# Patient Record
Sex: Male | Born: 1970
Health system: Southern US, Community
[De-identification: ages and names within clinical notes are randomized; demographics above are authoritative.]

## PROBLEM LIST (undated history)

## (undated) DIAGNOSIS — F319 Bipolar disorder, unspecified: Secondary | ICD-10-CM

## (undated) DIAGNOSIS — E785 Hyperlipidemia, unspecified: Secondary | ICD-10-CM

## (undated) DIAGNOSIS — F4310 Post-traumatic stress disorder, unspecified: Secondary | ICD-10-CM

## (undated) DIAGNOSIS — F419 Anxiety disorder, unspecified: Secondary | ICD-10-CM

## (undated) DIAGNOSIS — A159 Respiratory tuberculosis unspecified: Secondary | ICD-10-CM

## (undated) DIAGNOSIS — I219 Acute myocardial infarction, unspecified: Secondary | ICD-10-CM

## (undated) DIAGNOSIS — I2510 Atherosclerotic heart disease of native coronary artery without angina pectoris: Secondary | ICD-10-CM

## (undated) DIAGNOSIS — I255 Ischemic cardiomyopathy: Secondary | ICD-10-CM

## (undated) DIAGNOSIS — I739 Peripheral vascular disease, unspecified: Secondary | ICD-10-CM

## (undated) DIAGNOSIS — Z87898 Personal history of other specified conditions: Secondary | ICD-10-CM

## (undated) DIAGNOSIS — I10 Essential (primary) hypertension: Secondary | ICD-10-CM

## (undated) DIAGNOSIS — U071 COVID-19: Secondary | ICD-10-CM

## (undated) HISTORY — DX: Personal history of other specified conditions: Z87.898

## (undated) HISTORY — PX: CORONARY ANGIOPLASTY WITH STENT PLACEMENT: SHX49

## (undated) HISTORY — DX: Essential (primary) hypertension: I10

## (undated) HISTORY — DX: Acute myocardial infarction, unspecified: I21.9

## (undated) HISTORY — DX: Hyperlipidemia, unspecified: E78.5

## (undated) HISTORY — DX: Anxiety disorder, unspecified: F41.9

## (undated) HISTORY — DX: Atherosclerotic heart disease of native coronary artery without angina pectoris: I25.10

## (undated) HISTORY — DX: Ischemic cardiomyopathy: I25.5

---

## 2005-08-31 ENCOUNTER — Emergency Department (HOSPITAL_COMMUNITY): Admission: EM | Admit: 2005-08-31 | Discharge: 2005-08-31 | Payer: Self-pay | Admitting: Family Medicine

## 2005-10-19 ENCOUNTER — Inpatient Hospital Stay (HOSPITAL_COMMUNITY): Admission: EM | Admit: 2005-10-19 | Discharge: 2005-10-23 | Payer: Self-pay | Admitting: Cardiology

## 2005-10-19 ENCOUNTER — Encounter: Payer: Self-pay | Admitting: Emergency Medicine

## 2005-10-19 ENCOUNTER — Ambulatory Visit: Payer: Self-pay | Admitting: Cardiology

## 2005-10-21 ENCOUNTER — Encounter: Payer: Self-pay | Admitting: Cardiology

## 2005-11-09 ENCOUNTER — Ambulatory Visit: Payer: Self-pay | Admitting: Cardiology

## 2006-01-07 ENCOUNTER — Emergency Department (HOSPITAL_COMMUNITY): Admission: EM | Admit: 2006-01-07 | Discharge: 2006-01-07 | Payer: Self-pay | Admitting: Family Medicine

## 2006-12-16 ENCOUNTER — Ambulatory Visit: Payer: Self-pay | Admitting: Cardiology

## 2006-12-16 LAB — CONVERTED CEMR LAB
ALT: 31 units/L (ref 0–40)
AST: 21 units/L (ref 0–37)
Albumin: 3.7 g/dL (ref 3.5–5.2)
Alkaline Phosphatase: 64 units/L (ref 39–117)
Bilirubin, Direct: 0.1 mg/dL (ref 0.0–0.3)
Cholesterol: 145 mg/dL (ref 0–200)
Direct LDL: 84.9 mg/dL
HDL: 29.4 mg/dL — ABNORMAL LOW (ref >39.0)
LDL Cholesterol: 93 mg/dL (ref 0–99)
Total Bilirubin: 1.1 mg/dL (ref 0.3–1.2)
Total CHOL/HDL Ratio: 4.9
Total Protein: 7.7 g/dL (ref 6.0–8.3)
Triglycerides: 113 mg/dL (ref 0–149)
VLDL: 23 mg/dL (ref 0–40)

## 2006-12-17 ENCOUNTER — Ambulatory Visit: Payer: Self-pay | Admitting: Cardiology

## 2007-01-17 ENCOUNTER — Ambulatory Visit: Payer: Self-pay | Admitting: Internal Medicine

## 2007-01-17 LAB — CONVERTED CEMR LAB
ALT: 40 units/L (ref 0–40)
AST: 24 units/L (ref 0–37)
Albumin: 3.6 g/dL (ref 3.5–5.2)
Alkaline Phosphatase: 85 units/L (ref 39–117)
BUN: 8 mg/dL (ref 6–23)
Bacteria, UA: NEGATIVE
Basophils Absolute: 0 10*3/uL (ref 0.0–0.1)
Basophils Relative: 0.4 % (ref 0.0–1.0)
Bilirubin Urine: NEGATIVE
Bilirubin, Direct: 0.1 mg/dL (ref 0.0–0.3)
C-Peptide: 1.17 ng/mL (ref 0.80–3.90)
CO2: 33 meq/L — ABNORMAL HIGH (ref 19–32)
Calcium: 8.9 mg/dL (ref 8.4–10.5)
Chloride: 99 meq/L (ref 96–112)
Creatinine, Ser: 1.3 mg/dL (ref 0.4–1.5)
Creatinine,U: 57.4 mg/dL
Crystals: NEGATIVE
Eosinophils Absolute: 0.1 10*3/uL (ref 0.0–0.6)
Eosinophils Relative: 1.4 % (ref 0.0–5.0)
GFR calc Af Amer: 80 mL/min
GFR calc non Af Amer: 66 mL/min
Glucose, Bld: 434 mg/dL — ABNORMAL HIGH (ref 70–99)
HCT: 46.4 % (ref 39.0–52.0)
Hemoglobin, Urine: NEGATIVE
Hemoglobin: 15.5 g/dL (ref 13.0–17.0)
Hgb A1c MFr Bld: 16.1 % — ABNORMAL HIGH (ref 4.6–6.0)
Ketones, ur: NEGATIVE mg/dL
Leukocytes, UA: NEGATIVE
Lymphocytes Relative: 40.2 % (ref 12.0–46.0)
MCHC: 33.4 g/dL (ref 30.0–36.0)
MCV: 80.3 fL (ref 78.0–100.0)
Microalb Creat Ratio: 3.5 mg/g (ref 0.0–30.0)
Microalb, Ur: 0.2 mg/dL (ref 0.0–1.9)
Monocytes Absolute: 0.2 10*3/uL (ref 0.2–0.7)
Monocytes Relative: 4 % (ref 3.0–11.0)
Mucus, UA: NEGATIVE
Neutro Abs: 3.2 10*3/uL (ref 1.4–7.7)
Neutrophils Relative %: 54 % (ref 43.0–77.0)
Nitrite: NEGATIVE
Platelets: 242 10*3/uL (ref 150–400)
Potassium: 4.2 meq/L (ref 3.5–5.1)
RBC: 5.78 M/uL (ref 4.22–5.81)
RDW: 11.4 % — ABNORMAL LOW (ref 11.5–14.6)
Sodium: 134 meq/L — ABNORMAL LOW (ref 135–145)
Specific Gravity, Urine: 1.01 (ref 1.000–1.03)
Squamous Epithelial / LPF: NEGATIVE /lpf
TSH: 1.21 microintl units/mL (ref 0.35–5.50)
Total Bilirubin: 1 mg/dL (ref 0.3–1.2)
Total Protein, Urine: NEGATIVE mg/dL
Total Protein: 8 g/dL (ref 6.0–8.3)
Urine Glucose: >=1000 mg/dL — CR
Urobilinogen, UA: 0.2 (ref 0.0–1.0)
WBC, UA: NONE SEEN cells/hpf
WBC: 5.9 10*3/uL (ref 4.5–10.5)
pH: 5.5 (ref 5.0–8.0)

## 2007-01-20 ENCOUNTER — Ambulatory Visit: Payer: Self-pay | Admitting: Internal Medicine

## 2009-09-15 ENCOUNTER — Ambulatory Visit: Payer: Self-pay | Admitting: Family Medicine

## 2009-09-15 ENCOUNTER — Inpatient Hospital Stay (HOSPITAL_COMMUNITY): Admission: EM | Admit: 2009-09-15 | Discharge: 2009-09-17 | Payer: Self-pay | Admitting: Emergency Medicine

## 2009-09-15 ENCOUNTER — Ambulatory Visit: Payer: Self-pay | Admitting: Internal Medicine

## 2009-09-16 ENCOUNTER — Encounter: Payer: Self-pay | Admitting: Family Medicine

## 2009-09-19 DIAGNOSIS — E1165 Type 2 diabetes mellitus with hyperglycemia: Secondary | ICD-10-CM | POA: Insufficient documentation

## 2009-09-25 ENCOUNTER — Emergency Department (HOSPITAL_COMMUNITY): Admission: EM | Admit: 2009-09-25 | Discharge: 2009-09-25 | Payer: Self-pay | Admitting: Emergency Medicine

## 2009-09-27 ENCOUNTER — Encounter: Payer: Self-pay | Admitting: Family Medicine

## 2009-09-27 ENCOUNTER — Ambulatory Visit: Payer: Self-pay | Admitting: Family Medicine

## 2009-09-27 DIAGNOSIS — F411 Generalized anxiety disorder: Secondary | ICD-10-CM | POA: Insufficient documentation

## 2009-09-27 LAB — CONVERTED CEMR LAB
BUN: 14 mg/dL (ref 6–23)
CO2: 27 meq/L (ref 19–32)
Calcium: 9.5 mg/dL (ref 8.4–10.5)
Chloride: 101 meq/L (ref 96–112)
Creatinine, Ser: 1.03 mg/dL (ref 0.40–1.50)
Glucose, Bld: 148 mg/dL — ABNORMAL HIGH (ref 70–99)
Potassium: 4.3 meq/L (ref 3.5–5.3)
Sodium: 138 meq/L (ref 135–145)

## 2009-10-03 ENCOUNTER — Encounter: Payer: Self-pay | Admitting: Physician Assistant

## 2009-10-03 ENCOUNTER — Ambulatory Visit: Payer: Self-pay | Admitting: Cardiology

## 2009-10-03 ENCOUNTER — Ambulatory Visit: Payer: Self-pay | Admitting: Family Medicine

## 2009-10-03 ENCOUNTER — Ambulatory Visit (HOSPITAL_COMMUNITY): Admission: RE | Admit: 2009-10-03 | Discharge: 2009-10-03 | Payer: Self-pay | Admitting: Family Medicine

## 2009-10-08 ENCOUNTER — Encounter: Payer: Self-pay | Admitting: *Deleted

## 2009-10-23 ENCOUNTER — Encounter: Payer: Self-pay | Admitting: Family Medicine

## 2009-10-23 ENCOUNTER — Ambulatory Visit: Payer: Self-pay | Admitting: Family Medicine

## 2009-10-23 LAB — CONVERTED CEMR LAB
Albumin/Creatinine Ratio, Urine, POC: <30
BUN: 16 mg/dL (ref 6–23)
CO2: 32 meq/L (ref 19–32)
Calcium: 9.1 mg/dL (ref 8.4–10.5)
Chloride: 99 meq/L (ref 96–112)
Creatinine, Ser: 1.02 mg/dL (ref 0.40–1.50)
Creatinine,U: 200 mg/dL
Glucose, Bld: 229 mg/dL — ABNORMAL HIGH (ref 70–99)
Hgb A1c MFr Bld: 8.9 %
Microalbumin U total vol: 30 mg/L
Potassium: 4.3 meq/L (ref 3.5–5.3)
Sodium: 137 meq/L (ref 135–145)

## 2009-11-01 ENCOUNTER — Encounter: Payer: Self-pay | Admitting: Family Medicine

## 2009-12-12 ENCOUNTER — Encounter: Payer: Self-pay | Admitting: Family Medicine

## 2010-01-27 ENCOUNTER — Telehealth: Payer: Self-pay | Admitting: *Deleted

## 2010-03-24 ENCOUNTER — Telehealth: Payer: Self-pay | Admitting: Family Medicine

## 2010-04-04 ENCOUNTER — Encounter (INDEPENDENT_AMBULATORY_CARE_PROVIDER_SITE_OTHER): Payer: Self-pay | Admitting: *Deleted

## 2010-04-14 ENCOUNTER — Ambulatory Visit: Payer: Self-pay | Admitting: Diagnostic Radiology

## 2010-04-14 ENCOUNTER — Emergency Department (HOSPITAL_BASED_OUTPATIENT_CLINIC_OR_DEPARTMENT_OTHER): Admission: EM | Admit: 2010-04-14 | Discharge: 2010-04-14 | Payer: Self-pay | Admitting: Emergency Medicine

## 2010-05-04 ENCOUNTER — Emergency Department (HOSPITAL_BASED_OUTPATIENT_CLINIC_OR_DEPARTMENT_OTHER): Admission: EM | Admit: 2010-05-04 | Discharge: 2010-05-04 | Payer: Self-pay | Admitting: Emergency Medicine

## 2010-05-05 ENCOUNTER — Emergency Department (HOSPITAL_COMMUNITY): Admission: EM | Admit: 2010-05-05 | Discharge: 2010-05-05 | Payer: Self-pay | Admitting: Emergency Medicine

## 2010-07-10 ENCOUNTER — Ambulatory Visit: Payer: Self-pay | Admitting: Family Medicine

## 2010-07-10 ENCOUNTER — Encounter: Payer: Self-pay | Admitting: Family Medicine

## 2010-07-10 ENCOUNTER — Telehealth (INDEPENDENT_AMBULATORY_CARE_PROVIDER_SITE_OTHER): Payer: Self-pay | Admitting: *Deleted

## 2010-07-10 DIAGNOSIS — I2510 Atherosclerotic heart disease of native coronary artery without angina pectoris: Secondary | ICD-10-CM | POA: Insufficient documentation

## 2010-07-10 LAB — CONVERTED CEMR LAB
ALT: 18 units/L (ref 0–53)
AST: 20 units/L (ref 0–37)
Albumin: 4.1 g/dL (ref 3.5–5.2)
Alkaline Phosphatase: 52 units/L (ref 39–117)
BUN: 15 mg/dL (ref 6–23)
CO2: 31 meq/L (ref 19–32)
Calcium: 9.7 mg/dL (ref 8.4–10.5)
Chloride: 100 meq/L (ref 96–112)
Cholesterol: 106 mg/dL (ref 0–200)
Creatinine, Ser: 0.98 mg/dL (ref 0.40–1.50)
Glucose, Bld: 166 mg/dL — ABNORMAL HIGH (ref 70–99)
HDL: 24 mg/dL — ABNORMAL LOW (ref >39)
LDL Cholesterol: 53 mg/dL (ref 0–99)
Potassium: 4.5 meq/L (ref 3.5–5.3)
Sodium: 138 meq/L (ref 135–145)
Total Bilirubin: 0.6 mg/dL (ref 0.3–1.2)
Total CHOL/HDL Ratio: 4.4
Total Protein: 8.3 g/dL (ref 6.0–8.3)
Triglycerides: 146 mg/dL (ref <150)
VLDL: 29 mg/dL (ref 0–40)

## 2010-07-14 ENCOUNTER — Telehealth (INDEPENDENT_AMBULATORY_CARE_PROVIDER_SITE_OTHER): Payer: Self-pay | Admitting: *Deleted

## 2010-07-18 ENCOUNTER — Ambulatory Visit: Payer: Self-pay | Admitting: Family Medicine

## 2010-07-30 ENCOUNTER — Telehealth: Payer: Self-pay | Admitting: Family Medicine

## 2010-07-31 ENCOUNTER — Ambulatory Visit: Payer: Self-pay | Admitting: Family Medicine

## 2010-08-19 ENCOUNTER — Encounter: Payer: Self-pay | Admitting: Family Medicine

## 2010-08-19 ENCOUNTER — Ambulatory Visit
Admission: RE | Admit: 2010-08-19 | Discharge: 2010-08-19 | Payer: Self-pay | Source: Home / Self Care | Attending: Family Medicine | Admitting: Family Medicine

## 2010-08-19 DIAGNOSIS — L209 Atopic dermatitis, unspecified: Secondary | ICD-10-CM | POA: Insufficient documentation

## 2010-09-09 NOTE — Progress Notes (Signed)
Summary: meds  Phone Note Refill Request Call back at [phone] Message from:  Patient  Refills Requested: Medication #1:  CARVEDILOL 25 MG TABS Take one tablet by mouth twice a day   Dosage confirmed as above?Dosage Confirmed   Brand Name Necessary? No   Supply Requested: 3 months  Medication #2:  METFORMIN HCL 500 MG XR24H-TAB 2 tabs qam..1 tab lunch time   Dosage confirmed as above?Dosage Confirmed   Brand Name Necessary? No   Supply Requested: 3 months  Medication #3:  ACCURETIC 20-25 MG TABS take 1 tab daily   Dosage confirmed as above?Dosage Confirmed   Brand Name Necessary? No   Supply Requested: 3 months wants to see if he can get these meds this week b/c his ins will be running out and wants enough to last for a little while more.   Initial call taken by: De Nurse,  March 24, 2010 1:31 PM  Follow-up for Phone Call        Rx completed in Dr. Tiajuana Amass Follow-up by: [name] DO,  March 24, 2010 2:14 PM    Prescriptions: ACCURETIC 20-25 MG TABS (QUINAPRIL-HYDROCHLOROTHIAZIDE) take 1 tab daily  #93 x 3   Entered and Authorized by:   [name] DO   Signed by:   [name] DO on 03/24/2010   Method used:   Electronically to        [facility] [phone]* (retail)       [address]       [place], [place]  [address]       Ph: [phone]       Fax: [phone]   RxID:   [id number] CARVEDILOL 25 MG TABS (CARVEDILOL) Take one tablet by mouth twice a day  #60 x 6   Entered and Authorized by:   [name] DO   Signed by:   [name] DO on 03/24/2010   Method used:   Electronically to        [facility] [phone]* (retail)       [address]       [place], [place]  [address]       Ph: [phone]       Fax: [phone]   RxID:   [id number] METFORMIN HCL 500 MG XR24H-TAB (METFORMIN HCL) 2 tabs qam..1 tab lunch time  #270 x 0   Entered and Authorized by:   [name] DO   Signed by:   [name] DO on 03/24/2010  Method used:   Electronically to        [facility] [phone]* (retail)       [address]       [place], [place]  [address]       Ph: [phone]       Fax: [phone]   RxID:   [id number]

## 2010-09-09 NOTE — Assessment & Plan Note (Signed)
Summary: f/u meds,df   Vital Signs:  Patient profile:   40 year old male Height:      72 inches Weight:      175 pounds BMI:     23.82 Temp:     98.5 degrees F Pulse rate:   76 / minute BP sitting:   140 / 92  (right arm)  Vitals Entered By: Rochele Pages RN (July 18, 2010 10:55 AM) CC: f/u meds Is Patient Diabetic? Yes Pain Assessment Patient in pain? no        Primary Care Kyler Germer:  [name]  CC:  f/u meds.  History of Present Illness: 40 yo male here for fu/  1. DM glucose low:67 Glucose high:222 Last A1C:6.3 beginning of this month.  Taking Meds:yes On INsulin:levimir 10 units Side effects:no ROS: Denies polyuria, polydipsia, visual changes numbness in extremities, foot ulcers Lifestyle modifications: has lost 15 pounds since being seen here this year.   2.  htn  BP today:140/92 on manual recheck Taking Meds:yes including 2 different ace inhibitors  Side Effects:no ROS: denies Headache visual changes nausea vomiting abdominal pain numbness in extremities    3.  Obesity-  Pt has lost 15 pounds and is feeling great, not really trying but has been eating less and trying to eat right to keep his A1C better    Habits & Providers  Alcohol-Tobacco-Diet     Tobacco Status: never  Allergies: No Known Drug Allergies  Physical Exam  General:  Well-developed,well-nourished,in no acute distress; alert,appropriate and cooperative throughout examination Eyes:  PERRLA, EOMI, minimal AV nicking seen on fudoscopic.   Mouth:  Oral mucosa and oropharynx without lesions or exudates.  Teeth in good repair. Neck:  no LAD Lungs:  normal respiratory effort and normal breath sounds.   Heart:  RRR mo murmur Abdomen:  BS + NT ND Msk:  5/5 strength in all extremities.  Pulses:  2+ Extremities:  no edema Neurologic:  CN 2-12 intact in all extremities.    Impression & Recommendations:  Problem # 1:  HYPERTENSION (ICD-401.9) pt is near goal but not there yet,  labs look good continue current regimen.   His updated medication list for this problem includes:    Carvedilol 25 Mg Tabs (Carvedilol) .Marland Kitchen... Take one tablet by mouth twice a day    Accuretic 20-25 Mg Tabs (Quinapril-hydrochlorothiazide) .Marland Kitchen... Take 1 tab daily    Accupril 10 Mg Tabs (Quinapril hcl) .Marland Kitchen... Take 1 tab daily in pm  Orders: [facility]- Est  Level 4 ([address]214)  BP today: 140/100 Prior BP: 177/110 (07/10/2010)  Labs Reviewed: K+: 4.5 (07/10/2010) Creat: : 0.98 (07/10/2010)   Chol: 106 (07/10/2010)   HDL: 24 (07/10/2010)   LDL: 53 (07/10/2010)   TG: 146 (07/10/2010)  Problem # 2:  DM (ICD-250.00) At goal.  Fantastic turn around from A1C of 16 when pt was originally seen now 6.3.  No side effects from medicine, will contineu current regimen, if pt starts to lose more weight will have to remove some the medication.  His updated medication list for this problem includes:    Metformin Hcl 500 Mg Xr24h-tab (Metformin hcl) .Marland Kitchen... 2 tabs qam..1 tab lunch time    Aspirin 81 Mg Tbec (Aspirin) .Marland Kitchen... Take one tablet by mouth daily    Accuretic 20-25 Mg Tabs (Quinapril-hydrochlorothiazide) .Marland Kitchen... Take 1 tab daily    Accupril 10 Mg Tabs (Quinapril hcl) .Marland Kitchen... Take 1 tab daily in pm    Levemir 100 Unit/ml Soln (Insulin detemir) .Marland KitchenMarland KitchenMarland KitchenMarland Kitchen  10 untis at bedtime  Orders: [facility]- Est  Level 4 ([address]64)  Labs Reviewed: Creat: 0.98 (07/10/2010)   Microalbumin: 30 (10/23/2009) Reviewed HgBA1c results: 6.3 (07/10/2010)  8.9 (10/23/2009)  Problem # 3:  ARTERIOSCLEROTIC HEART DISEASE (ICD-414.00) Pt is doing very well on all meds properly on lipitor as well  His updated medication list for this problem includes:    Aspirin 81 Mg Tbec (Aspirin) .Marland Kitchen... Take one tablet by mouth daily    Carvedilol 25 Mg Tabs (Carvedilol) .Marland Kitchen... Take one tablet by mouth twice a day    Nitrostat 0.4 Mg Subl (Nitroglycerin) .Marland Kitchen... 1 tablet under tongue at onset of chest pain; you may repeat every 5 minutes for up to 3 doses.    Accuretic  20-25 Mg Tabs (Quinapril-hydrochlorothiazide) .Marland Kitchen... Take 1 tab daily    Accupril 10 Mg Tabs (Quinapril hcl) .Marland Kitchen... Take 1 tab daily in pm  Orders: [facility]- Est  Level 4 (33295)  Complete Medication List: 1)  Metformin Hcl 500 Mg Xr24h-tab (Metformin hcl) .... 2 tabs qam..1 tab lunch time 2)  Aspirin 81 Mg Tbec (Aspirin) .... Take one tablet by mouth daily 3)  Carvedilol 25 Mg Tabs (Carvedilol) .... Take one tablet by mouth twice a day 4)  Nitrostat 0.4 Mg Subl (Nitroglycerin) .Marland Kitchen.. 1 tablet under tongue at onset of chest pain; you may repeat every 5 minutes for up to 3 doses. 5)  Bayer Contour Test Strp (Glucose blood) .... Use as directed 6)  Accuretic 20-25 Mg Tabs (Quinapril-hydrochlorothiazide) .... Take 1 tab daily 7)  Accupril 10 Mg Tabs (Quinapril hcl) .... Take 1 tab daily in pm 8)  Lipitor 40 Mg Tabs (Atorvastatin calcium) .... Take 1 tab by mouth once daily 9)  Multivitamins Tabs (Multiple vitamin) 10)  Folic Acid 1 Mg Tabs (Folic acid) 11)  Levemir 100 Unit/ml Soln (Insulin detemir) .Marland Kitchen.. 10 untis at bedtime Prescriptions: METFORMIN HCL 500 MG XR24H-TAB (METFORMIN HCL) 2 tabs qam..1 tab lunch time  #270 x 0   Entered and Authorized by:   [name] DO   Signed by:   [name] DO on 07/18/2010   Method used:   Electronically to        [facility] [phone]* (retail)       [address]       [place], [place]  [address]       Ph: [phone]       Fax: [phone]   RxID:   [id number] METFORMIN HCL 500 MG XR24H-TAB (METFORMIN HCL) 2 tabs qam..1 tab lunch time  #270 x 0   Entered and Authorized by:   [name] DO   Signed by:   [name] DO on 07/18/2010   Method used:   Electronically to        [facility] [phone]* (retail)       [address]       [place], [place]  [address]       Ph: [phone]       Fax: [phone]   RxID:   [id number]    Orders Added: 1)  [facility]- Est  Level 4 [[address]751]    Prevention & Chronic  Care Immunizations   Influenza vaccine: Not documented   Influenza vaccine deferral: Deferred  (09/27/2009)    Tetanus booster: Not documented   Td booster deferral: Deferred  (09/27/2009)    Pneumococcal vaccine: Not documented  Other Screening   Smoking status: never  (07/18/2010)  Diabetes Mellitus   HgbA1C: 6.3  (  07/10/2010)   Hemoglobin A1C due: 12/25/2009    Eye exam: Not documented    Foot exam: Not documented   High risk foot: Not documented   Foot care education: Not documented    Urine microalbumin/creatinine ratio: 3.5  (01/17/2007)    Diabetes flowsheet reviewed?: Yes   Progress toward A1C goal: At goal    Stage of readiness to change (diabetes management): Maintenance  Lipids   Total Cholesterol: 106  (07/10/2010)   LDL: 53  (07/10/2010)   LDL Direct: 84.9  (12/16/2006)   HDL: 24  (07/10/2010)   Triglycerides: 146  (07/10/2010)   Lipid panel due: 01/17/2011  Hypertension   Last Blood Pressure: 140 / 92  (07/18/2010)   Serum creatinine: 0.98  (07/10/2010)   Serum potassium 4.5  (07/10/2010)   Basic metabolic panel due: 10/25/2009    Hypertension flowsheet reviewed?: Yes   Progress toward BP goal: Improved    Stage of readiness to change (hypertension management): Maintenance  Self-Management Support :   Personal Goals (by the next clinic visit) :     Personal A1C goal: 7  (07/18/2010)     Personal blood pressure goal: 140/90  (07/18/2010)     Personal LDL goal: 100  (07/18/2010)    Diabetes self-management support: CBG self-monitoring log, Written self-care plan, Pre-printed educational material, Referred for self-management class  (09/27/2009)    Hypertension self-management support: BP self-monitoring log, Written self-care plan, Resources for patients handout  (09/27/2009)

## 2010-09-09 NOTE — Consult Note (Signed)
Summary: [facility]   Imported By: Roderic Ovens 09/30/2009 14:09:31  _____________________________________________________________________  External Attachment:    Type:   Image     Comment:   External Document

## 2010-09-09 NOTE — Assessment & Plan Note (Signed)
Summary: FU/KH   Vital Signs:  Patient profile:   40 year old male Height:      71 inches Weight:      184.1 pounds BMI:     25.77 Temp:     98.7 degrees F oral Pulse rate:   86 / minute BP sitting:   134 / 87  (left arm) Cuff size:   regular  Vitals Entered By: Garen Grams LPN (October 23, 2009 2:50 PM) CC: f/u Is Patient Diabetic? Yes Did you bring your meter with you today? Yes Pain Assessment Patient in pain? yes     Location: left wrist   Primary Care Provider:  [name]  CC:  f/u.  History of Present Illness: Pt is doing well here for f/u on  1.  BP- f/u 134/87 today.  improved, doing well.  Taking all meds and now can afford all of them with the aid of [facility].  2.  DM- Sugars have been relatively good pt says most are now in the 130's to 140's.  Pt is now on 12 units of insulin and increase metformin to 1000mg  in AM and 500mg  in pm pt was in the 200's before.  will refer to opthomologist.  3.  left wrist pain-  improved with motrin.  Pt states he is able to do all ADL's now.  Still having pain though.  Has appointment with sports medicine on 24th.  Pt had imaging done showed no fracture.  Is improving and has not had leaking feeling as he did before  Habits & Providers  Alcohol-Tobacco-Diet     Tobacco Status: never  Current Medications (verified): 1)  Metformin Hcl 500 Mg Xr24h-Tab (Metformin Hcl) .... 2 Tabs Qam..1 Tab Lunch Time 2)  Lisinopril-Hydrochlorothiazide 20-25 Mg Tabs (Lisinopril-Hydrochlorothiazide) .... Take 1 Tab Po Daily 3)  Ativan 0.5 Mg Tabs (Lorazepam) .... Take 1 Pill Up To Two Times A Day Only As Needed 4)  Lisinopril 20 Mg Tabs (Lisinopril) .... Take 1 Tab Daily 5)  Aspirin 81 Mg Tbec (Aspirin) .... Take One Tablet By Mouth Daily 6)  Simvastatin 80 Mg Tabs (Simvastatin) .Marland Kitchen.. 1 Tab At Bedtime 7)  Carvedilol 25 Mg Tabs (Carvedilol) .... Take One Tablet By Mouth Twice A Day 8)  Nitrostat 0.4 Mg Subl (Nitroglycerin) .Marland Kitchen.. 1 Tablet  Under Tongue At Onset of Chest Pain; You May Repeat Every 5 Minutes For Up To 3 Doses. 9)  Motrin Ib 200 Mg Tabs (Ibuprofen) .... Take 2 Pills By Mouth Every 8 Hours For Pain For The Next 5 Days Then Take Only As Needed 10)  Bayer Contour Test  Strp (Glucose Blood) .... Use As Directed  Allergies (verified): No Known Drug Allergies  Past History:  Past medical, surgical, family and social histories (including risk factors) reviewed, and no changes noted (except as noted below).  Past Medical History: Reviewed history from 09/19/2009 and no changes required.  1. Diabetes.   2. Hypertension.   3. History of MA in 2007, actually is the date which he did have a       catheterization done and did have a drug eluding stent placed in       the circumflex at age 36.      Past Surgical History: Reviewed history from 09/19/2009 and no changes required.  The cardiac catheterization, otherwise   unremarkable.      Family History: Reviewed history from 09/19/2009 and no changes required.  Mother is deceased due to some coronary artery disease  and history of smoking.  She also had diabetes.  Father is deceased, he   does not know father well.  The patient does have some siblings and they   are healthy.   Social History: Reviewed history from 09/19/2009 and no changes required. He lives along with his girlfriend and her kids.  He is   divorced.  He does have 3 daughters and 1 son from 3 other women.  The   patient is unemployed at this time and used to be a [employer] architect.  He   does not smoke, but girlfriend does in the house.  Occasional alcoholic   use and denies any drug abuse.    Impression & Recommendations:  Problem # 1:  HYPERTENSION (ICD-401.9) Assessment Improved At our goal of <140/90 for him.  Continue current tx His updated medication list for this problem includes:    Lisinopril-hydrochlorothiazide 20-25 Mg Tabs (Lisinopril-hydrochlorothiazide) .Marland Kitchen... Take 1 tab po  daily    Lisinopril 20 Mg Tabs (Lisinopril) .Marland Kitchen... Take 1 tab daily    Carvedilol 25 Mg Tabs (Carvedilol) .Marland Kitchen... Take one tablet by mouth twice a day  Orders: Basic Met-[facility] ([address]329-51884) [facility]- Est  Level 4 ([address])  Problem # 2:  DM (ICD-250.00) improved, A1C today.  Refer for eye exam.  See again in 3 months check A1c His updated medication list for this problem includes:    Metformin Hcl 500 Mg Xr24h-tab (Metformin hcl) .Marland Kitchen... 2 tabs qam..1 tab lunch time    Lisinopril-hydrochlorothiazide 20-25 Mg Tabs (Lisinopril-hydrochlorothiazide) .Marland Kitchen... Take 1 tab po daily    Lisinopril 20 Mg Tabs (Lisinopril) .Marland Kitchen... Take 1 tab daily    Aspirin 81 Mg Tbec (Aspirin) .Marland Kitchen... Take one tablet by mouth daily  Orders: UA Microalbumin-[facility] (30160) A1C-[facility] (10932) Ophthalmology Referral (Ophthalmology) [facility]- Est  Level 4 (35573)  Problem # 3:  WRIST PAIN, LEFT (UKG-254.27) Assessment: Improved will see sports medicine on the 24th.   Orders: [facility]- Est  Level 4 ([address]237)  Complete Medication List: 1)  Metformin Hcl 500 Mg Xr24h-tab (Metformin hcl) .... 2 tabs qam..1 tab lunch time 2)  Lisinopril-hydrochlorothiazide 20-25 Mg Tabs (Lisinopril-hydrochlorothiazide) .... Take 1 tab po daily 3)  Ativan 0.5 Mg Tabs (Lorazepam) .... Take 1 pill up to two times a day only as needed 4)  Lisinopril 20 Mg Tabs (Lisinopril) .... Take 1 tab daily 5)  Aspirin 81 Mg Tbec (Aspirin) .... Take one tablet by mouth daily 6)  Simvastatin 80 Mg Tabs (Simvastatin) .Marland Kitchen.. 1 tab at bedtime 7)  Carvedilol 25 Mg Tabs (Carvedilol) .... Take one tablet by mouth twice a day 8)  Nitrostat 0.4 Mg Subl (Nitroglycerin) .Marland Kitchen.. 1 tablet under tongue at onset of chest pain; you may repeat every 5 minutes for up to 3 doses. 9)  Motrin Ib 200 Mg Tabs (Ibuprofen) .... Take 2 pills by mouth every 8 hours for pain for the next 5 days then take only as needed 10)  Bayer Contour Test Strp (Glucose blood) .... Use as directed  Patient Instructions: 1)  Good  to see you 2)  I want you to keep Taking your medicinations. 3)  You can try Yohimbe if you want to try.  I do not want to give you viagra right now with all the medications you are taking. 4)  We will get some labs today.  5)  I want you to try to change your diet some and increase whole grain.   6)  I want to see you again in 2-3  months  Laboratory Results   Urine Tests  Date/Time Received: October 23, 2009 3:22 PM  Date/Time Reported: October 23, 2009 3:58 PM   Microalbumin (urine): 30 mg/L Creatinine: 200mg /dL  A:C Ratio <72 Normal Comments: ...............test performed by......Marland KitchenBonnie A. [place], MLS (ASCP)cm   Blood Tests   Date/Time Received: October 23, 2009 3:22 PM  Date/Time Reported: October 23, 2009 3:58 PM   HGBA1C: 8.9%   (Normal Range: Non-Diabetic - 3-6%   Control Diabetic - 6-8%)  Comments: ...............test performed by......Marland KitchenBonnie A. [place], MLS (ASCP)cm      Prevention & Chronic Care Immunizations   Influenza vaccine: Not documented   Influenza vaccine deferral: Deferred  (09/27/2009)    Tetanus booster: Not documented   Td booster deferral: Deferred  (09/27/2009)    Pneumococcal vaccine: Not documented  Other Screening   Smoking status: never  (10/23/2009)  Diabetes Mellitus   HgbA1C: 8.9  (10/23/2009)   Hemoglobin A1C due: 12/25/2009    Eye exam: Not documented    Foot exam: Not documented   High risk foot: Not documented   Foot care education: Not documented    Urine microalbumin/creatinine ratio: 3.5  (01/17/2007)    Diabetes flowsheet reviewed?: Yes   Progress toward A1C goal: Improved  Lipids   Total Cholesterol: 145  (12/16/2006)   LDL: 93  (12/16/2006)   LDL Direct: 84.9  (12/16/2006)   HDL: 29.4  (12/16/2006)   Triglycerides: 113  (12/16/2006)  Hypertension   Last Blood Pressure: 134 / 87  (10/23/2009)   Serum creatinine: 1.03  (09/27/2009)   Serum potassium 4.3  (09/27/2009)   Basic metabolic panel due:  10/25/2009    Hypertension flowsheet reviewed?: Yes   Progress toward BP goal: At goal  Self-Management Support :    Diabetes self-management support: CBG self-monitoring log, Written self-care plan, Pre-printed educational material, Referred for self-management class  (09/27/2009)    Hypertension self-management support: BP self-monitoring log, Written self-care plan, Resources for patients handout  (09/27/2009)

## 2010-09-09 NOTE — Assessment & Plan Note (Signed)
Summary: review meds/see Office visit for 07/10/10  Nurse Visit   Vital Signs:  Patient profile:   40 year old male Height:      72 inches Weight:      177 pounds BMI:     24.09 Temp:     98.8 degrees F Pulse rate:   100 / minute BP sitting:   177 / 110  (right arm)  Vitals Entered By: Theresia Lo RN (July 10, 2010 11:21 AM) CC: BP elevated, confusion with medications Is Patient Diabetic? Yes Pain Assessment Patient in pain? no        Habits & Providers  Alcohol-Tobacco-Diet     Tobacco Status: never  Allergies: No Known Drug Allergies  Orders Added: 1)  Comp Met-[facility] [80053-22900] 2)  Lipid-[facility] [80061-22930] 3)  A1C-[facility] [83036] Prescriptions: NITROSTAT 0.4 MG SUBL (NITROGLYCERIN) 1 tablet under tongue at onset of chest pain; you may repeat every 5 minutes for up to 3 doses.  #1 x 1   Entered and Authorized by:   Luretha Murphy NP   Signed by:   Luretha Murphy NP on 07/10/2010   Method used:   Print then Give to Patient   RxID:   [id number]

## 2010-09-09 NOTE — Assessment & Plan Note (Signed)
Primary Care Provider:  [name]   History of Present Illness: Patient came in for a nurse visit to check meds, he was confused about his BP meds.  He has not been in since March, was hospitalized in Feb 2011.  Meds were reviewed, he has 2 different ACEI (explained how to read the label) but has not been taking regularly.  He took lisinopril 20 mg this morning.  He does not know what his sugars have been running, he is taking long acting metformin 500 and 10 untils of Levemir (was to be on Lantus but has a supply of Levemir at home). The insulin was started at discharge from the hospital in [place].  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  The holidays stress him out.  He has a flu shot this year.  Allergies: No Known Drug Allergies  Review of Systems       The patient complains of weight loss.  The patient denies chest pain, syncope, peripheral edema, prolonged cough, and severe indigestion/heartburn.    Physical Exam  General:  Well-developed,well-nourished,in no acute distress; alert,appropriate and cooperative throughout examination Lungs:  normal respiratory effort and normal breath sounds.   Heart:  rate 100 and regular (has beta blocker but has not been taking)   Impression & Recommendations:  Problem # 1:  HYPERTENSION (ICD-401.9)  elevated today with low health literacy, went through meds and updated list, recheck in 2 weeks with primary MD His updated medication list for this problem includes:    Carvedilol 25 Mg Tabs (Carvedilol) .Marland Kitchen... Take one tablet by mouth twice a day    Accuretic 20-25 Mg Tabs (Quinapril-hydrochlorothiazide) .Marland Kitchen... Take 1 tab daily    Accupril 10 Mg Tabs (Quinapril hcl) .Marland Kitchen... Take 1 tab daily in pm  Orders: [facility]- Est Level  3 ([address])  Problem # 2:  DM (ICD-250.00)  Will get labs today, discussed diet, he is worried that he has lost weight, BMI at 24,  discussed this was the goal  His updated medication list for this problem includes:    Metformin Hcl 500 Mg Xr24h-tab (Metformin hcl) .Marland Kitchen... 2 tabs qam..1 tab lunch time    Aspirin 81 Mg Tbec (Aspirin) .Marland Kitchen... Take one tablet by mouth daily    Accuretic 20-25 Mg Tabs (Quinapril-hydrochlorothiazide) .Marland Kitchen... Take 1 tab daily    Accupril 10 Mg Tabs (Quinapril hcl) .Marland Kitchen... Take 1 tab daily in pm    Levemir 100 Unit/ml Soln (Insulin detemir) .Marland KitchenMarland KitchenMarland KitchenMarland Kitchen 10 untis at bedtime  Orders: [facility]- Est Level  3 ([address])  Problem # 3:  ARTERIOSCLEROTIC HEART DISEASE (ICD-414.00)  Has missed apts with cards, stenting 4 years ago, denies chest pain, did refill Nirtostat His updated medication list for this problem includes:    Aspirin 81 Mg Tbec (Aspirin) .Marland Kitchen... Take one tablet by mouth daily    Carvedilol 25 Mg Tabs (Carvedilol) .Marland Kitchen... Take one tablet by mouth twice a day    Nitrostat 0.4 Mg Subl (Nitroglycerin) .Marland Kitchen... 1 tablet under tongue at onset of chest pain; you may repeat every 5 minutes for up to 3 doses.    Accuretic 20-25 Mg Tabs (Quinapril-hydrochlorothiazide) .Marland Kitchen... Take 1 tab daily    Accupril 10 Mg Tabs (Quinapril hcl) .Marland Kitchen... Take 1 tab daily in pm  Orders: [facility]- Est Level  3 ([address]11)  Complete Medication List: 1)  Metformin Hcl 500 Mg Xr24h-tab (Metformin hcl) .... 2 tabs qam..1  tab lunch time 2)  Aspirin 81 Mg Tbec (Aspirin) .... Take one tablet by mouth daily 3)  Carvedilol 25 Mg Tabs (Carvedilol) .... Take one tablet by mouth twice a day 4)  Nitrostat 0.4 Mg Subl (Nitroglycerin) .Marland Kitchen.. 1 tablet under tongue at onset of chest pain; you may repeat every 5 minutes for up to 3 doses. 5)  Bayer Contour Test Strp (Glucose blood) .... Use as directed 6)  Accuretic 20-25 Mg Tabs (Quinapril-hydrochlorothiazide) .... Take 1 tab daily 7)  Accupril 10 Mg Tabs (Quinapril hcl) .... Take 1 tab daily in pm 8)  Lipitor 40 Mg Tabs (Atorvastatin calcium) .... Take 1 tab by mouth once daily 9)  Multivitamins Tabs (Multiple  vitamin) 10)  Folic Acid 1 Mg Tabs (Folic acid) 11)  Levemir 100 Unit/ml Soln (Insulin detemir) .Marland Kitchen.. 10 untis at bedtime  Patient Instructions: 1)  Only take the meds on the list, hold the others and you can use them eventually.  Bring all meds in when you see Dr. Katrinka Blazing 2)  Apt is December 13   Orders Added: 1)  [facility]- Est Level  3 [[address]]      Appended Document: A1c results  Laboratory Results   Blood Tests   Date/Time Received: July 10, 2010 11:50 AM  Date/Time Reported: July 10, 2010 2:09 PM   HGBA1C: 6.3%   (Normal Range: Non-Diabetic - 3-6%   Control Diabetic - 6-8%)  Comments: ...........test performed by...........Marland KitchenTerese Door, CMA

## 2010-09-09 NOTE — Letter (Signed)
Summary: Generic Letter  [facility]  [address]   [place], [place] [address]   Phone: [phone]  Fax: [phone]    10/08/2009  Paul [name] [address] [place], [place]  [address]  Dear [name],    I was unable to contact you by phone.   Dr. Katrinka Blazing requested we schedule you an appointment in the  [facility] . An appointment has been scheduled for 10/16/2009 at 2:30 PM. Please call their office at [phone] for directions or you can call our office and we can give you directions.     Thank you      Sincerely,   Theresia Lo RN

## 2010-09-09 NOTE — Progress Notes (Signed)
Summary: Ref Req  Phone Note Call from Patient Call back at [phone]   Caller: Patient Summary of Call: Pt has orange card and would like to be set up with an eye doctor. Initial call taken by: Clydell Hakim,  January 27, 2010 9:29 AM  Follow-up for Phone Call        will forward to MD. Follow-up by: Theresia Lo RN,  January 27, 2010 12:36 PM  Additional Follow-up for Phone Call Additional follow up Details #1::        placed order, in and forwarded it to blue team.   Additional Follow-up by: [name] DO,  January 27, 2010 2:46 PM

## 2010-09-09 NOTE — Assessment & Plan Note (Signed)
Summary: hfu,df   Vital Signs:  Patient profile:   40 year old male Height:      71 inches Weight:      189 pounds BMI:     26.46 BSA:     2.06 Temp:     99.9 degrees F Pulse rate:   75 / minute BP sitting:   185 / 114  Vitals Entered By: Jone Baseman CMA (September 27, 2009 1:37 PM) CC: HFU - BP Is Patient Diabetic? No Pain Assessment Patient in pain? yes     Location: left arm Intensity: 7   Primary Care Provider:  [name]  CC:  HFU - BP.  History of Present Illness: Pt is here for follow up from hospitalization.  Pt states has not been doing well because  1.  HTN- Pt brought readings of blood pressure.  The readings have been 200's systoolic on multiple occasions,  pt denies headache, n/v, abdominal pain orweakness in extremities.  Pt has been taking lisinopril, coreg, on daily basis at least  2.  DM-  Pt states most of his blood sugars are in the 200's never 300 s and some in the 160's.  Pt is on 10 untis of lantus nightly.  Deniesnumbness in extremities  3.  Left wrist feeling.  Pt states that his left wrist locks up from time to time.  States it usually is when he has checked his blood pressure and it is high.  Pt states that he may get nervous and then he notices that his wrist begins to hurt.  Still able to do daily activities of daily living.    Habits & Providers  Alcohol-Tobacco-Diet     Tobacco Status: never  Current Medications (verified): 1)  Metformin Hcl 500 Mg Xr24h-Tab (Metformin Hcl) .... Take 1 Tablet By Mouth Two Times A Day (Office Visit Needed For Refills) 2)  Lisinopril-Hydrochlorothiazide 20-25 Mg Tabs (Lisinopril-Hydrochlorothiazide) .... Take 1 Tab Po Daily 3)  Ativan 0.5 Mg Tabs (Lorazepam) .... Take 1 Pill Up To Two Times A Day Only As Needed  Allergies (verified): No Known Drug Allergies  Past History:  please see HPI of hospitalization.  Update to follow  Social History: Smoking Status:  never  Review of Systems   denies fever, chills, nausea, vomiting, diarrhea or constipation or cp  Physical Exam  General:  Well-developed,well-nourished,in no acute distress; alert,appropriate and cooperative throughout examination Eyes:  PERRLA, EOMI, minimal AV nicking seen on fudoscopic.   Mouth:  Oral mucosa and oropharynx without lesions or exudates.  Teeth in good repair. Lungs:  Normal respiratory effort, chest expands symmetrically. Lungs are clear to auscultation, no crackles or wheezes. Heart:  RRR 1/6 SEM Abdomen:  BS + NT ND Extremities:  left wrist no swelling, full active and passive rom, some tender to palpation when hand is completely palmarflexed Neurologic:  2-12 intact NVI   Impression & Recommendations:  Problem # 1:  HYPERTENSION (ICD-401.9) Assessment Unchanged increased lisinopril, added HCTz, will montior f/u in 1 week retest BMET His updated medication list for this problem includes:    Lisinopril-hydrochlorothiazide 20-25 Mg Tabs (Lisinopril-hydrochlorothiazide) .Marland Kitchen... Take 1 tab po daily    Lisinopril 20 Mg Tabs (Lisinopril) .Marland Kitchen... Take 1 tab daily  Orders: Basic Met-[facility] ([address]) [facility]- Est Level  3 ([address]11)  Problem # 2:  DM (ICD-250.00) Assessment: Unchanged Increase metformin to 2 two times a day increase lantus to 12 untis, retest CBGg in 1 week His updated medication list for this problem includes:  Metformin Hcl 500 Mg Xr24h-tab (Metformin hcl) .Marland Kitchen... Take2 tablet by mouth two times a day (office visit needed for refills)    Lisinopril-hydrochlorothiazide 20-25 Mg Tabs (Lisinopril-hydrochlorothiazide) .Marland Kitchen... Take 1 tab po daily    Lisinopril 20 Mg Tabs (Lisinopril) .Marland Kitchen... Take 1 tab daily  Orders: Glucose Cap-[facility] (045[address]) [facility]- Est Level  3 (98119)  Problem # 3:  ANXIETY STATE, UNSPECIFIED (ICD-300.00)  His updated medication list for this problem includes:    Ativan 0.5 Mg Tabs (Lorazepam) .Marland Kitchen... Take 1 pill up to two times a day only as needed  Problem # 4:  WRIST  PAIN, LEFT (ICD-719.43) 2/2 to anxiety, told to try tylenol  Medications Added to Medication List This Visit: 1)  Metformin Hcl 500 Mg Xr24h-tab (Metformin hcl) .... Take2 tablet by mouth two times a day (office visit needed for refills) 2)  Lisinopril-hydrochlorothiazide 20-25 Mg Tabs (Lisinopril-hydrochlorothiazide) .... Take 1 tab po daily 3)  Ativan 0.5 Mg Tabs (Lorazepam) .... Take 1 pill up to two times a day only as needed 4)  Lisinopril 20 Mg Tabs (Lisinopril) .... Take 1 tab daily  Complete Medication List: 1)  Metformin Hcl 500 Mg Xr24h-tab (Metformin hcl) .... Take2 tablet by mouth two times a day (office visit needed for refills) 2)  Lisinopril-hydrochlorothiazide 20-25 Mg Tabs (Lisinopril-hydrochlorothiazide) .... Take 1 tab po daily 3)  Ativan 0.5 Mg Tabs (Lorazepam) .... Take 1 pill up to two times a day only as needed 4)  Lisinopril 20 Mg Tabs (Lisinopril) .... Take 1 tab daily  Patient Instructions: 1)  Good to see you.   2)  I am giving you a new med.  You are going to take it 1 time daily 3)  Continue all other meds 4)  Go up to 12 units on your lantus 5)  I want to see you in 1 week.   6)  On your metformin take  2 pills in morning and 1 pill in the evning 7)  Once again see you in 1 week Prescriptions: ATIVAN 0.5 MG TABS (LORAZEPAM) take 1 pill up to two times a day only as needed  #30 x 10   Entered and Authorized by:   [name] DO   Signed by:   [name] DO on 09/27/2009   Method used:   Print then Give to Patient   RxID:   [id number] LISINOPRIL-HYDROCHLOROTHIAZIDE 20-25 MG TABS (LISINOPRIL-HYDROCHLOROTHIAZIDE) take 1 tab po daily  #93 x 3   Entered and Authorized by:   [name] DO   Signed by:   [name] DO on 09/27/2009   Method used:   Print then Give to Patient   RxID:   [id number]   Prevention & Chronic Care Immunizations   Influenza vaccine: Not documented   Influenza vaccine deferral: Deferred  (09/27/2009)     Tetanus booster: Not documented   Td booster deferral: Deferred  (09/27/2009)    Pneumococcal vaccine: Not documented  Other Screening   Smoking status: never  (09/27/2009)  Diabetes Mellitus   HgbA1C: 16.1  (01/17/2007)   Hemoglobin A1C due: 12/25/2009    Eye exam: Not documented    Foot exam: Not documented   High risk foot: Not documented   Foot care education: Not documented    Urine microalbumin/creatinine ratio: 3.5  (01/17/2007)    Diabetes flowsheet reviewed?: Yes   Progress toward A1C goal: Unchanged    Stage of readiness to change (diabetes management): Preparation  Lipids   Total Cholesterol: 145  (  12/16/2006)   LDL: 93  (12/16/2006)   LDL Direct: 84.9  (12/16/2006)   HDL: 29.4  (12/16/2006)   Triglycerides: 113  (12/16/2006)  Hypertension   Last Blood Pressure: 185 / 114  (09/27/2009)   Serum creatinine: 1.3  (01/17/2007)   Serum potassium 4.2  (01/17/2007)   Basic metabolic panel due: 10/25/2009    Hypertension flowsheet reviewed?: Yes   Progress toward BP goal: Deteriorated  Self-Management Support :    Diabetes self-management support: CBG self-monitoring log, Written self-care plan, Pre-printed educational material, Referred for self-management class  (09/27/2009)   Diabetes care plan printed    Hypertension self-management support: BP self-monitoring log, Written self-care plan, Resources for patients handout  (09/27/2009)   Hypertension self-care plan printed.      Resource handout printed.

## 2010-09-09 NOTE — Letter (Signed)
Summary: Appointment - Missed  [facility], Main Office  [address]   [place], [place] [address]   Phone: [phone]  Fax: [phone]     April 04, 2010 MRN: [id number]   ABU HEAVIN [address] [place], [place]  [address]   Dear Mr. DOZAL,  Our records indicate you missed your appointment on August 17th, 2011 with Dr. Johney Frame.  It is very important that we reach you to reschedule this appointment. We look forward to participating in your health care needs. Please contact us at the number listed above at your earliest convenience to reschedule this appointment.     Sincerely,    Glass blower/designer

## 2010-09-09 NOTE — Progress Notes (Signed)
  Phone Note Call from Patient   Caller: Patient Call For: [phone] Summary of Call: Mr. Pinney need refill on his Metformin and Lisinopril-HCTZ 20-25.  He has received the Accuretic-HCTZ, but need the Lisinopril.  Please call pt back for pharmacy . Initial call taken by: Abundio Miu,  July 10, 2010 8:52 AM  Follow-up for Phone Call        spoke with patient and he is confused about his meds . he will come to office with all meds  to review this AM. Follow-up by: Theresia Lo RN,  July 10, 2010 9:18 AM

## 2010-09-09 NOTE — Miscellaneous (Signed)
Summary: Orders Update  Clinical Lists Changes Pt joined [facility] and we change medications so they would be covered.   Medications: Added new medication of ACCURETIC 20-25 MG TABS (QUINAPRIL-HYDROCHLOROTHIAZIDE) take 1 tab daily - Signed Added new medication of ACCUPRIL 10 MG TABS (QUINAPRIL HCL) take 1 tab daily in pm - Signed Added new medication of LIPITOR 40 MG TABS (ATORVASTATIN CALCIUM) take 1 tab by mouth once daily - Signed Removed medication of LISINOPRIL-HYDROCHLOROTHIAZIDE 20-25 MG TABS (LISINOPRIL-HYDROCHLOROTHIAZIDE) take 1 tab po daily Removed medication of LISINOPRIL 20 MG TABS (LISINOPRIL) take 1 tab daily Removed medication of SIMVASTATIN 80 MG TABS (SIMVASTATIN) 1 tab at bedtime Rx of ACCURETIC 20-25 MG TABS (QUINAPRIL-HYDROCHLOROTHIAZIDE) take 1 tab daily;  #93 x 3;  Signed;  Entered by: [name] DO;  Authorized by: [name] DO;  Method used: Faxed to [facility], [address], [place], [place]  [address], Ph: [phone], Fax: [phone] Rx of ACCUPRIL 10 MG TABS (QUINAPRIL HCL) take 1 tab daily in pm;  #93 x 3;  Signed;  Entered by: [name] DO;  Authorized by: [name] DO;  Method used: Faxed to [facility], [address], [place], [place]  [address], Ph: [phone], Fax: [phone] Rx of LIPITOR 40 MG TABS (ATORVASTATIN CALCIUM) take 1 tab by mouth once daily;  #93 x 3;  Signed;  Entered by: [name] DO;  Authorized by: [name] DO;  Method used: Faxed to [facility], [address], [place], [place]  [address], Ph: [phone], Fax: [phone]    Prescriptions: LIPITOR 40 MG TABS (ATORVASTATIN CALCIUM) take 1 tab by mouth once daily  #93 x 3   Entered and Authorized by:   [name] DO   Signed by:   [name] DO on 12/12/2009   Method used:   Faxed to ...       [facility] (retail)  [address]       [place], [place]  [address]       Ph: [phone]       Fax: [phone]   RxID:   [id number] ACCUPRIL 10 MG TABS (QUINAPRIL HCL) take 1 tab daily in pm  #93 x 3   Entered and Authorized by:   [name] DO   Signed by:   [name] DO on 12/12/2009   Method used:   Faxed to ...       [facility] (retail)       [address]       [place], [place]  [address]       Ph: [phone]       Fax: [phone]   RxID:   [id number] ACCURETIC 20-25 MG TABS (QUINAPRIL-HYDROCHLOROTHIAZIDE) take 1 tab daily  #93 x 3   Entered and Authorized by:   [name] DO   Signed by:   [name] DO on 12/12/2009   Method used:   Faxed to ...       [facility] (retail)       [address]       [place], [place]  [address]       Ph: [phone]       Fax: [phone]   RxID:   [id number]

## 2010-09-09 NOTE — Progress Notes (Signed)
Summary: phn msg  Phone Note Call from Patient Call back at [phone]   Caller: Patient Summary of Call: pt had appt w/ Katrinka Blazing next week but he will be out of town.  wants to come in this week to see doc and get his meds. pls advise Initial call taken by: De Nurse,  July 14, 2010 11:42 AM  Follow-up for Phone Call        May double book the morning (late morning approx 11am) on the 7th.  Thanks Follow-up by: [name] DO,  July 14, 2010 12:09 PM

## 2010-09-09 NOTE — Miscellaneous (Signed)
Summary: Re: ophth referral  Clinical Lists Changes   received notification from [facility] that they are unable to complete ophth referral at this time due to lack of physiciains in this speciality group. they will notify patient when they can complete referral. Theresia Lo RN  November 01, 2009 2:05 PM

## 2010-09-09 NOTE — Assessment & Plan Note (Signed)
Summary: eph/jml   Visit Type:  EPH Primary Provider:  [name]  CC:  pt states that he thinks he has had a in his left arm..this happened around the time of the SuperBowl.. said he tried to tell Dr. Katrinka Blazing but said he was told that the medicine would fix his arm...sob when walking...denies any cp or edema.  History of Present Illness: This is a 40 year old, African American male, patient, who was recently admitted to the hospital with atypical chest pain, and palpitations. He ruled out for an MI and was discharged home.He had not been taking all his medications because of financial hardship he also complained of pain in his left wrist area, but no source of this pain was found.  Today the patient denies chest pain, palpitations, dyspnea, dyspnea on exertion, dizziness, or presyncope. He continues to complain of pain in his left rib. He says he has weakness on that side and continues to have pain. He does have an appointment today with Dr. Ardeen Garland [facility] at 3:30.  Current Medications (verified): 1)  Metformin Hcl 500 Mg Xr24h-Tab (Metformin Hcl) .... 2 Tabs Qam..1 Tab Lunch Time 2)  Lisinopril-Hydrochlorothiazide 20-25 Mg Tabs (Lisinopril-Hydrochlorothiazide) .... Take 1 Tab Po Daily 3)  Ativan 0.5 Mg Tabs (Lorazepam) .... Take 1 Pill Up To Two Times A Day Only As Needed 4)  Lisinopril 20 Mg Tabs (Lisinopril) .... Take 1 Tab Daily 5)  Aspirin 81 Mg Tbec (Aspirin) .... Take One Tablet By Mouth Daily 6)  Simvastatin 80 Mg Tabs (Simvastatin) .Marland Kitchen.. 1 Tab At Bedtime 7)  Carvedilol 12.5 Mg Tabs (Carvedilol) .Marland Kitchen.. 1 Tab Two Times A Day 8)  Nitrostat 0.4 Mg Subl (Nitroglycerin) .Marland Kitchen.. 1 Tablet Under Tongue At Onset of Chest Pain; You May Repeat Every 5 Minutes For Up To 3 Doses.  Allergies (verified): No Known Drug Allergies  Past History:  Past Medical History: Last updated: 09/19/2009  1. Diabetes.   2. Hypertension.   3. History of MA in 2007, actually is the date  which he did have a       catheterization done and did have a drug eluding stent placed in       the circumflex at age 73.      Review of Systems       see history of present illness  Vital Signs:  Patient profile:   40 year old male Height:      71 inches Weight:      184 pounds BMI:     25.76 Pulse rate:   78 / minute Pulse rhythm:   irregular BP sitting:   162 / 100  (left arm) Cuff size:   regular  Vitals Entered By: Danielle Rankin, CMA (October 03, 2009 1:24 PM)  Physical Exam  General:   Well-nournished, in no acute distress. Neck: No JVD, HJR, Bruit, or thyroid enlargement Lungs: No tachypnea, clear without wheezing, rales, or rhonchi Cardiovascular: RRR, PMI not displaced, heart sounds normal, no murmurs, gallops, bruit, thrill, or heave. Abdomen: BS normal. Soft without organomegaly, masses, lesions or tenderness. Extremities: his left wrist on the radial side is tender to touch is a question of slight swelling and possible bruising. He has good pulses.without cyanosis, clubbing or edema. Good distal pulses bilateral SKin: Warm, no lesions or rashes  Musculoskeletal: No deformities Neuro: no focal signs    EKG  Procedure date:  10/03/2009  Findings:      normal sinus rhythm, nonspecific ST-T changes. No acute change  Impression & Recommendations:  Problem # 1:  CHEST PAIN, ATYPICAL (ICD-786.59) Patient has history of coronary artery disease, status post anterior MI in 2007 , treated with a drug-eluting stent to the LAD. He has residual 70% distal LAD 60% OM. He had an AV circumflex reduced with a drug-eluting stent. He has residual 90% PDA ejection fraction 54%. Patient denies further chest pain and ruled out with negative enzymes in the hospital.2-D echo, February seventh 2011 showed mild LVH. Ejection fraction 40-45% with akinesis of the apical myocardium and akinesis of the mid distal septal myocardium His updated medication list for this problem includes:     Lisinopril-hydrochlorothiazide 20-25 Mg Tabs (Lisinopril-hydrochlorothiazide) .Marland Kitchen... Take 1 tab po daily    Lisinopril 20 Mg Tabs (Lisinopril) .Marland Kitchen... Take 1 tab daily    Aspirin 81 Mg Tbec (Aspirin) .Marland Kitchen... Take one tablet by mouth daily    Carvedilol 25 Mg Tabs (Carvedilol) .Marland Kitchen... Take one tablet by mouth twice a day    Nitrostat 0.4 Mg Subl (Nitroglycerin) .Marland Kitchen... 1 tablet under tongue at onset of chest pain; you may repeat every 5 minutes for up to 3 doses.  Orders: EKG w/ Interpretation (93000)  Problem # 2:  HYPERTENSION (ICD-401.9) patient's blood pressure is elevated. We will increase his carvedilol to 25 mg b.i.d. His updated medication list for this problem includes:    Lisinopril-hydrochlorothiazide 20-25 Mg Tabs (Lisinopril-hydrochlorothiazide) .Marland Kitchen... Take 1 tab po daily    Lisinopril 20 Mg Tabs (Lisinopril) .Marland Kitchen... Take 1 tab daily    Aspirin 81 Mg Tbec (Aspirin) .Marland Kitchen... Take one tablet by mouth daily    Carvedilol 25 Mg Tabs (Carvedilol) .Marland Kitchen... Take one tablet by mouth twice a day  Problem # 3:  WRIST PAIN, LEFT (HQI-696.29) Patient continues to complain of left wrist pain. He has good pulses in this area. He is scheduled to see Dr. Katrinka Blazing at 3:30 today at [facility], family practice. I asked him to follow up with Dr. Katrinka Blazing concerning this.  Patient Instructions: 1)  Your physician recommends that you schedule a follow-up appointment in: 6 months with Dr. Johney Frame 2)  Your physician has recommended you make the following change in your medication: Carvedilol 25 mg take one tablet by mouth twice a day. Prescriptions: CARVEDILOL 25 MG TABS (CARVEDILOL) Take one tablet by mouth twice a day  #60 x 6   Entered by:   Ollen Gross, RN, BSN   Authorized by:   Marletta Lor, PA-C   Signed by:   Ollen Gross, RN, BSN on 10/03/2009   Method used:   Electronically to        [facility]* (retail)       [address]       [address] Shipping/mailing        [place], [place]  [address]       Ph: [phone]       Fax: [phone]   RxID:   [id number]

## 2010-09-09 NOTE — Assessment & Plan Note (Signed)
Summary: f/u eo   Vital Signs:  Patient profile:   40 year old male Height:      71 inches Weight:      184 pounds BMI:     25.76 BSA:     2.04 Temp:     98.5 degrees F Pulse rate:   109 / minute BP sitting:   143 / 93  Vitals Entered By: Jone Baseman CMA (October 03, 2009 3:09 PM) CC: bp, dm, and wrist/forearm pain Is Patient Diabetic? Yes Did you bring your meter with you today? No Pain Assessment Patient in pain? yes     Location: left arm Intensity: 8   Primary Care Provider:  [name]  CC:  bp, dm, and and wrist/forearm pain.  History of Present Illness: Pt is doing well here for multiple complaints  1.  BP- f/u started HCTZ to regimen last visit.  Much improved BP to 143/93.  Pt states at home has been 130-140's most of the time.  Is feeling better overall as well.  Denies HA visual changes, CP, or abdominal pain.  Pulse is up today so could go up on carvidolol if need more control  2.  DM- Sugars have been relatively good pt says most are now in the 130's to 150's.  Pt is now on 12 units of insulin and increase metformin to 1000mg  in AM and 500mg  in pm pt was in the 200's before.  Next visit will refer pt for eye and foot exam.  3.  left wrist pain-  Pt has c/o of this pain multiple times, large w/u in hospital, found nothing.  Pt states the pain is specific area, stays localized, above wrist on palmar side of forearm.  Pt states that it is hurting so much he cannot tie his shoes.  Pt denies any numbness or swelling in hands or UE, still able to pick up things, and denies wrist pain or elbow pain.  Pt states he get a feeling of "oozing like something is leaking in the arm".    Habits & Providers  Alcohol-Tobacco-Diet     Tobacco Status: never  Current Medications (verified): 1)  Metformin Hcl 500 Mg Xr24h-Tab (Metformin Hcl) .... 2 Tabs Qam..1 Tab Lunch Time 2)  Lisinopril-Hydrochlorothiazide 20-25 Mg Tabs (Lisinopril-Hydrochlorothiazide) .... Take 1 Tab  Po Daily 3)  Ativan 0.5 Mg Tabs (Lorazepam) .... Take 1 Pill Up To Two Times A Day Only As Needed 4)  Lisinopril 20 Mg Tabs (Lisinopril) .... Take 1 Tab Daily 5)  Aspirin 81 Mg Tbec (Aspirin) .... Take One Tablet By Mouth Daily 6)  Simvastatin 80 Mg Tabs (Simvastatin) .Marland Kitchen.. 1 Tab At Bedtime 7)  Carvedilol 25 Mg Tabs (Carvedilol) .... Take One Tablet By Mouth Twice A Day 8)  Nitrostat 0.4 Mg Subl (Nitroglycerin) .Marland Kitchen.. 1 Tablet Under Tongue At Onset of Chest Pain; You May Repeat Every 5 Minutes For Up To 3 Doses. 9)  Motrin Ib 200 Mg Tabs (Ibuprofen) .... Take 2 Pills By Mouth Every 8 Hours For Pain For The Next 5 Days Then Take Only As Needed 10)  Bayer Contour Test  Strp (Glucose Blood) .... Use As Directed  Allergies (verified): No Known Drug Allergies  Past History:  Past medical, surgical, family and social histories (including risk factors) reviewed, and no changes noted (except as noted below).  Past Medical History: Reviewed history from 09/19/2009 and no changes required.  1. Diabetes.   2. Hypertension.   3. History of MA in 2007,  actually is the date which he did have a       catheterization done and did have a drug eluding stent placed in       the circumflex at age 41.      Past Surgical History: Reviewed history from 09/19/2009 and no changes required.  The cardiac catheterization, otherwise   unremarkable.      Family History: Reviewed history from 09/19/2009 and no changes required.  Mother is deceased due to some coronary artery disease   and history of smoking.  She also had diabetes.  Father is deceased, he   does not know father well.  The patient does have some siblings and they   are healthy.   Social History: Reviewed history from 09/19/2009 and no changes required. He lives along with his girlfriend and her kids.  He is   divorced.  He does have 3 daughters and 1 son from 3 other women.  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  He   does not smoke, but girlfriend does in the house.  Occasional alcoholic   use and denies any drug abuse.   Review of Systems       denies fever, chills, nausea, vomiting, diarrhea or constipation   Physical Exam  General:  Well-developed,well-nourished,in no acute distress; alert,appropriate and cooperative throughout examination Lungs:  Normal respiratory effort, chest expands symmetrically. Lungs are clear to auscultation, no crackles or wheezes. Heart:  RRR 1/6 SEM Extremities:  left wrist no swelling, full active and passive rom, some tender to palpation on the palmar aspect of the forearm just medial to the radial bone.  Negative tinnel's sign at wrist but does cause sensation/pain when percussion over the area in question.  No defomity seen no mass or fluid palpated.  Sensation intact Skin:  no rash   Impression & Recommendations:  Problem # 1:  HYPERTENSION (ICD-401.9)  His updated medication list for this problem includes:    Lisinopril-hydrochlorothiazide 20-25 Mg Tabs (Lisinopril-hydrochlorothiazide) .Marland Kitchen... Take 1 tab po daily    Lisinopril 20 Mg Tabs (Lisinopril) .Marland Kitchen... Take 1 tab daily    Carvedilol 25 Mg Tabs (Carvedilol) .Marland Kitchen... Take one tablet by mouth twice a day  Orders: [facility]- Est  Level 4 (99214)Future Orders: Basic [facility] ([address]) ... 10/02/2010  Problem # 2:  DM (ICD-250.00)  His updated medication list for this problem includes:    Metformin Hcl 500 Mg Xr24h-tab (Metformin hcl) .Marland Kitchen... 2 tabs qam..1 tab lunch time    Lisinopril-hydrochlorothiazide 20-25 Mg Tabs (Lisinopril-hydrochlorothiazide) .Marland Kitchen... Take 1 tab po daily    Lisinopril 20 Mg Tabs (Lisinopril) .Marland Kitchen... Take 1 tab daily    Aspirin 81 Mg Tbec (Aspirin) .Marland Kitchen... Take one tablet by mouth daily  Orders: [facility]- Est  Level 4 (09811)  Problem # 3:  WRIST PAIN, LEFT (ICD-719.43) Seems more like nerve pain on clinical exam.  possible inflammation.  Send for x-ray to r/o fx.  given motrin to try  to decrease inflammation, will try ice, then do exercises given for forearm strengthening to try to give better structure to forearm to prevent nrerve entrapment.  In addition will send to sports medicine clinic to be evaluated.  Orders: Diagnostic X-Ray/Fluoroscopy (Diagnostic X-Ray/Flu) Sports Medicine (Sports Med) [facility]- Est  Level 4 (91478)  Complete Medication List: 1)  Metformin Hcl 500 Mg Xr24h-tab (Metformin hcl) .... 2 tabs qam..1 tab lunch time 2)  Lisinopril-hydrochlorothiazide 20-25 Mg Tabs (Lisinopril-hydrochlorothiazide) .... Take 1  tab po daily 3)  Ativan 0.5 Mg Tabs (Lorazepam) .... Take 1 pill up to two times a day only as needed 4)  Lisinopril 20 Mg Tabs (Lisinopril) .... Take 1 tab daily 5)  Aspirin 81 Mg Tbec (Aspirin) .... Take one tablet by mouth daily 6)  Simvastatin 80 Mg Tabs (Simvastatin) .Marland Kitchen.. 1 tab at bedtime 7)  Carvedilol 25 Mg Tabs (Carvedilol) .... Take one tablet by mouth twice a day 8)  Nitrostat 0.4 Mg Subl (Nitroglycerin) .Marland Kitchen.. 1 tablet under tongue at onset of chest pain; you may repeat every 5 minutes for up to 3 doses. 9)  Motrin Ib 200 Mg Tabs (Ibuprofen) .... Take 2 pills by mouth every 8 hours for pain for the next 5 days then take only as needed 10)  Bayer Contour Test Strp (Glucose blood) .... Use as directed  Patient Instructions: 1)  I want you take blood pressure meds as prescribed.  You are doing much better 2)  For your arm.  I a will get an x-ray I will call you if anything is abnormal 3)  I want you to try motrin 400mg  Every eight hours for the next five days 4)  I want you to ice your arm for twenty minutes every 6-8 hours for the next 3 days 5)  I want you to do the soup can exercise I taught you.  Do them  twice a day for the next week 6)  I am also going to get you to be seen at our sports medicine clinic.  If can be seen in the next week that would be great 7)  I wan t to see you in the next 3-4 weeks Prescriptions: BAYER CONTOUR TEST   STRP (GLUCOSE BLOOD) use as directed  #270 x 4   Entered by:   Jone Baseman CMA   Authorized by:   [name] DO   Signed by:   Jone Baseman CMA on 10/03/2009   Method used:   Print then Give to Patient   RxID:   [id number] MOTRIN IB 200 MG TABS (IBUPROFEN) take 2 pills by mouth every 8 hours for pain for the next 5 days then take only as needed  #60 x 3   Entered by:   Jone Baseman CMA   Authorized by:   [name] DO   Signed by:   Jone Baseman CMA on 10/03/2009   Method used:   Print then Give to Patient   RxID:   [id number] BAYER CONTOUR TEST  STRP (GLUCOSE BLOOD) use as directed  #270 x 4   Entered and Authorized by:   [name] DO   Signed by:   [name] DO on 10/03/2009   Method used:   Electronically to        [facility] [phone]* (retail)       [address]       [place], [place]  [address]       Ph: [phone]       Fax: [phone]   RxID:   [id number] MOTRIN IB 200 MG TABS (IBUPROFEN) take 2 pills by mouth every 8 hours for pain for the next 5 days then take only as needed  #60 x 3   Entered and Authorized by:   [name] DO   Signed by:   [name] DO on 10/03/2009   Method used:   Electronically to        [facility] [id number]* (retail)       [address]       [place], [place]  [address]       Ph: [phone]       Fax: [phone]   RxID:   [id number]   Prevention & Chronic Care Immunizations   Influenza vaccine: Not documented   Influenza vaccine deferral: Deferred  (09/27/2009)    Tetanus booster: Not documented   Td booster deferral: Deferred  (09/27/2009)    Pneumococcal vaccine: Not documented  Other Screening   Smoking status: never  (10/03/2009)  Diabetes Mellitus   HgbA1C: 16.1  (01/17/2007)   Hemoglobin A1C due: 12/25/2009    Eye exam: Not documented    Foot exam: Not documented   High risk foot: Not documented   Foot care education: Not documented    Urine  microalbumin/creatinine ratio: 3.5  (01/17/2007)    Diabetes flowsheet reviewed?: Yes   Progress toward A1C goal: Unchanged  Lipids   Total Cholesterol: 145  (12/16/2006)   LDL: 93  (12/16/2006)   LDL Direct: 84.9  (12/16/2006)   HDL: 29.4  (12/16/2006)   Triglycerides: 113  (12/16/2006)  Hypertension   Last Blood Pressure: 143 / 93  (10/03/2009)   Serum creatinine: 1.03  (09/27/2009)   Serum potassium 4.3  (09/27/2009)   Basic metabolic panel due: 10/25/2009    Hypertension flowsheet reviewed?: Yes   Progress toward BP goal: Improved    Stage of readiness to change (hypertension management): Action  Self-Management Support :    Diabetes self-management support: CBG self-monitoring log, Written self-care plan, Pre-printed educational material, Referred for self-management class  (09/27/2009)    Hypertension self-management support: BP self-monitoring log, Written self-care plan, Resources for patients handout  (09/27/2009)

## 2010-09-11 NOTE — Miscellaneous (Signed)
Summary: walk in  Clinical Lists Changes walked in c/o "leaking from rash on both wrists. eczema looking rash fron thumbs to wrist & on inner elbows. states it is also in his groin. states he has a hx of eczema. says this has been going on for a year has not seen a doctor for this. then stated it got worse when he started taking the plavix. does not want to take plavix but knows he must. states he is afraid of dying. appt at 3:15. pcp not available & this was the first open appt left for today.Golden Circle RN  August 19, 2010 2:01 PM

## 2010-09-11 NOTE — Assessment & Plan Note (Signed)
Summary: rash x 1 yr. attributes to plavix/Ross/smith   Vital Signs:  Patient profile:   40 year old male Height:      72 inches Weight:      175 pounds BMI:     23.82 Temp:     98.6 degrees F oral Pulse rate:   67 / minute Pulse rhythm:   regular BP sitting:   158 / 105  (right arm) Cuff size:   regular  Vitals Entered By: Loralee Pacas CMA (August 19, 2010 3:42 PM)  Serial Vital Signs/Assessments:  Time      Position  BP       Pulse  Resp  Temp     By                     145/92                         Paul Harness MD  CC: rash x 1 year due to meds Is Patient Diabetic? Yes Did you bring your meter with you today? No Pain Assessment Patient in pain? no        Primary Care Provider:  Antoine [name]  CC:  rash x 1 year due to meds.  History of Present Illness: 27 you here for work in appointment for bilateral arm rash.  Told Triage he has had this for a year but worse with plavix.  He tells me it has been for one month.  he has been on plavix previously several years ago but states he got put back on plavix last month.  Rash is itchy, not painful, dry located bilateral radial side wrists and inner elbows.  No new detergents,  soaps, exposures.  Dry skin.  NO histry of eczema but children have it and he feels it is similar.  has not tried anything for it.  Habits & Providers  Alcohol-Tobacco-Diet     Tobacco Status: never  Exercise-Depression-Behavior     Have you felt down or hopeless? no     Have you felt little pleasure in things? no     Depression Counseling: not indicated; screening negative for depression     Seat Belt Use: always  Current Medications (verified): 1)  Metformin Hcl 500 Mg Xr24h-Tab (Metformin Hcl) .... 2 Tabs Qam..1 Tab Lunch Time 2)  Aspirin 81 Mg Tbec (Aspirin) .... Take One Tablet By Mouth Daily 3)  Carvedilol 25 Mg Tabs (Carvedilol) .... Take One Tablet By Mouth Twice A Day 4)  Nitrostat 0.4 Mg Subl (Nitroglycerin) .Marland Kitchen.. 1 Tablet Under  Tongue At Onset of Chest Pain; You May Repeat Every 5 Minutes For Up To 3 Doses. 5)  Bayer Contour Test  Strp (Glucose Blood) .... Use As Directed 6)  Accuretic 20-25 Mg Tabs (Quinapril-Hydrochlorothiazide) .... Take 1 Tab Daily 7)  Accupril 10 Mg Tabs (Quinapril Hcl) .... Take 1 Tab Daily in Pm 8)  Lipitor 40 Mg Tabs (Atorvastatin Calcium) .... Take 1 Tab By Mouth Once Daily 9)  Multivitamins  Tabs (Multiple Vitamin) 10)  Folic Acid 1 Mg Tabs (Folic Acid) 11)  Levemir 100 Unit/ml Soln (Insulin Detemir) .Marland Kitchen.. 10 Untis At Bedtime 12)  Plavix 75 Mg Tabs (Clopidogrel Bisulfate) .Marland Kitchen.. 1 Tab Daily For Stents 13)  Lorazepam 0.5 Mg Tabs (Lorazepam) .Marland Kitchen.. 1 Tab By Mouth Two Times A Day As Needed For Anxiety 14)  Triamcinolone Acetonide 0.1 % Crea (Triamcinolone Acetonide) .... Apply To Affected Area Three Times A  Day.  Dispense 60 Gm Tube  Allergies: No Known Drug Allergies PMH-FH-SH reviewed for relevance  Social History: Risk analyst Use:  always  Review of Systems      See HPI  Physical Exam  General:  Well-developed,well-nourished,in no acute distress; alert,appropriate and cooperative throughout examinatio.  slightly anxious. Skin:  bilateral inner elbows with some healing excoriations.  bilateral wrists with dry skin, hypertrophy, excoriation without erythema.  NO vesicle or other lesions.  located on flexor surface of radial wrists.   Impression & Recommendations:  Problem # 1:  DERMATITIS, ATOPIC (ICD-691.8)  Likely atopic, worsened by dry skin/cold weather.  given triamcinolone, reassured I do not think it is drug reaction to plavix.  Discussed skinmoisturization.  Will follow-up if no improvement.  His updated medication list for this problem includes:    Triamcinolone Acetonide 0.1 % Crea (Triamcinolone acetonide) .Marland Kitchen... Apply to affected area three times a day.  dispense 60 gm tube  Orders: [facility]- Est Level  3 ([address])  Complete Medication List: 1)  Metformin Hcl 500 Mg Xr24h-tab  (Metformin hcl) .... 2 tabs qam..1 tab lunch time 2)  Aspirin 81 Mg Tbec (Aspirin) .... Take one tablet by mouth daily 3)  Carvedilol 25 Mg Tabs (Carvedilol) .... Take one tablet by mouth twice a day 4)  Nitrostat 0.4 Mg Subl (Nitroglycerin) .Marland Kitchen.. 1 tablet under tongue at onset of chest pain; you may repeat every 5 minutes for up to 3 doses. 5)  Bayer Contour Test Strp (Glucose blood) .... Use as directed 6)  Accuretic 20-25 Mg Tabs (Quinapril-hydrochlorothiazide) .... Take 1 tab daily 7)  Accupril 10 Mg Tabs (Quinapril hcl) .... Take 1 tab daily in pm 8)  Lipitor 40 Mg Tabs (Atorvastatin calcium) .... Take 1 tab by mouth once daily 9)  Multivitamins Tabs (Multiple vitamin) 10)  Folic Acid 1 Mg Tabs (Folic acid) 11)  Levemir 100 Unit/ml Soln (Insulin detemir) .Marland Kitchen.. 10 untis at bedtime 12)  Plavix 75 Mg Tabs (Clopidogrel bisulfate) .Marland Kitchen.. 1 tab daily for stents 13)  Lorazepam 0.5 Mg Tabs (Lorazepam) .Marland Kitchen.. 1 tab by mouth two times a day as needed for anxiety 14)  Triamcinolone Acetonide 0.1 % Crea (Triamcinolone acetonide) .... Apply to affected area three times a day.  dispense 60 gm tube  Patient Instructions: 1)  use triamcinolone cream to itchy areas 2)  return if no improvement. 3)  I sent prescription for metformin to your pharmacy 4)  follow-up with Dr. Katrinka Blazing as regularly scheduled Prescriptions: TRIAMCINOLONE ACETONIDE 0.1 % CREA (TRIAMCINOLONE ACETONIDE) apply to affected area three times a day.  Dispense 60 gm tube  #1 x 1   Entered and Authorized by:   Paul Harness MD   Signed by:   Paul Harness MD on 08/19/2010   Method used:   Printed then faxed to ...       [facility] Department (retail)       [address] [place], [place]  [address]       Ph: [phone]       Fax: [phone]   RxID:   [id number] METFORMIN HCL 500 MG XR24H-TAB (METFORMIN HCL) 2 tabs qam..1 tab lunch time  #270 x 0   Entered and Authorized by:   Paul Harness MD   Signed by:   Paul Harness MD on 08/19/2010   Method used:   Printed then faxed to ...       [facility] Department (retail)  [address] [place], [place]  [address]       Ph: [phone]       Fax: [phone]   RxID:   [id number]    Orders Added: 1)  [facility]- Est Level  3 [[address]]

## 2010-09-11 NOTE — Assessment & Plan Note (Signed)
Summary: Paul [name],Paul [name]   Vital Signs:  Patient profile:   40 year old male Weight:      175 pounds Temp:     99 degrees F oral Pulse rate:   62 / minute Pulse rhythm:   regular BP sitting:   130 / 88  (left arm) Cuff size:   regular  Vitals Entered By: Loralee Pacas CMA (July 31, 2010 4:03 PM) CC: hospital follow up   Primary Care Provider:  [name]  CC:  hospital follow up.  History of Present Illness: 40 yo male here for hosptial follow up after a weekend down in [place] where he do a little more activity.  Pt on drive home started to have some left arm pain and then it moved up to his chest so he stopped at the hospital, due to his hx felt he neede dto have a cath went in and found to have 2 occluded arteries and had four stents placed. Pt states he is feeling very good but is nervous to do any type of activity.  Pt is nervous and thinks he would need disabilty now.  Pt is not having any pain at moment no shortness of breath and really is feeling good.  Pt was just here before his trip to [place] where he had good blood pressure control and vast improvement in his A1C.   Pt is having lots of anxiety and has episodes at any time when he feels overwhelmed and has left arm pain with starts at his wrist and goes up.  Pt states it usually goes away but would like something for these episodes does not want any drug that he would have to take daily.   Current Medications (verified): 1)  Metformin Hcl 500 Mg Xr24h-Tab (Metformin Hcl) .... 2 Tabs Qam..1 Tab Lunch Time 2)  Aspirin 81 Mg Tbec (Aspirin) .... Take One Tablet By Mouth Daily 3)  Carvedilol 25 Mg Tabs (Carvedilol) .... Take One Tablet By Mouth Twice A Day 4)  Nitrostat 0.4 Mg Subl (Nitroglycerin) .Marland Kitchen.. 1 Tablet Under Tongue At Onset of Chest Pain; You May Repeat Every 5 Minutes For Up To 3 Doses. 5)  Bayer Contour Test  Strp (Glucose Blood) .... Use As Directed 6)  Accuretic 20-25 Mg Tabs  (Quinapril-Hydrochlorothiazide) .... Take 1 Tab Daily 7)  Accupril 10 Mg Tabs (Quinapril Hcl) .... Take 1 Tab Daily in Pm 8)  Lipitor 40 Mg Tabs (Atorvastatin Calcium) .... Take 1 Tab By Mouth Once Daily 9)  Multivitamins  Tabs (Multiple Vitamin) 10)  Folic Acid 1 Mg Tabs (Folic Acid) 11)  Levemir 100 Unit/ml Soln (Insulin Detemir) .Marland Kitchen.. 10 Untis At Bedtime 12)  Plavix 75 Mg Tabs (Clopidogrel Bisulfate) .Marland Kitchen.. 1 Tab Daily For Stents 13)  Lorazepam 0.5 Mg Tabs (Lorazepam) .Marland Kitchen.. 1 Tab By Mouth Two Times A Day As Needed For Anxiety  Allergies (verified): No Known Drug Allergies  Past History:  Past medical, surgical, family and social histories (including risk factors) reviewed, and no changes noted (except as noted below).  Past Medical History: Reviewed history from 09/19/2009 and no changes required.  1. Diabetes.   2. Hypertension.   3. History of MA in 2007, actually is the date which he did have a       catheterization done and did have a drug eluding stent placed in       the circumflex at age 65.      Past Surgical History:  The cardiac catheterization, otherwise  unremarkable.    cardiac cath with 2 vessel disease and 4 stents placed. 12/11  Family History: Reviewed history from 09/19/2009 and no changes required.  Mother is deceased due to some coronary artery disease   and history of smoking.  She also had diabetes.  Father is deceased, he   does not know father well.  The patient does have some siblings and they   are healthy.   Social History: Reviewed history from 09/19/2009 and no changes required. He lives along with his girlfriend and her kids.  He is   divorced.  He does have 3 daughters and 1 son from 3 other women.  The   patient is unemployed at this time and used to be a Naval architect.  He   does not smoke, but girlfriend does in the house.  no longe smokes  use and denies any drug abuse.   Review of Systems       denies fever, chills, nausea, vomiting,  diarrhea or constipation shortness of breath or new chest pain since episode.   Physical Exam  General:  Well-developed,well-nourished,in no acute distress; alert,appropriate and cooperative throughout examination mo anxious than usual Eyes:  PERRLA, EOMI, Mouth:  Oral mucosa and oropharynx without lesions or exudates.  Teeth in good repair. Neck:  no LAD Lungs:  normal respiratory effort and normal breath sounds.   Heart:  RRR no murmur Abdomen:  BS + NT ND Pulses:  2+ Extremities:  no edema left arm full ROM full strength 2+ reflexes.    Impression & Recommendations:  Problem # 1:  ARTERIOSCLEROTIC HEART DISEASE (ICD-414.00)  4 stents placed at [facility] in 12/11.  Started plavix sent to [facility].  Pt seems to be very anxious.  His updated medication list for this problem includes:    Aspirin 81 Mg Tbec (Aspirin) .Marland Kitchen... Take one tablet by mouth daily    Carvedilol 25 Mg Tabs (Carvedilol) .Marland Kitchen... Take one tablet by mouth twice a day    Nitrostat 0.4 Mg Subl (Nitroglycerin) .Marland Kitchen... 1 tablet under tongue at onset of chest pain; you may repeat every 5 minutes for up to 3 doses.    Accuretic 20-25 Mg Tabs (Quinapril-hydrochlorothiazide) .Marland Kitchen... Take 1 tab daily    Accupril 10 Mg Tabs (Quinapril hcl) .Marland Kitchen... Take 1 tab daily in pm    Plavix 75 Mg Tabs (Clopidogrel bisulfate) .Marland Kitchen... 1 tab daily for stents  Orders: [facility]- Est  Level 4 (78469)  Problem # 2:  ANXIETY STATE, UNSPECIFIED (ICD-300.00)  pt give lorazepam, seems to be possibly generalized.  Did not want to see a therapist at this time and did not want a medication to take daily.  Will see pt in 1 month for follow up and readdress.  Told pt of side effects of meds.  His updated medication list for this problem includes:    Lorazepam 0.5 Mg Tabs (Lorazepam) .Marland Kitchen... 1 tab by mouth two times a day as needed for anxiety  Orders: [facility]- Est  Level 4 (62952)  Problem # 3:  HYPERTENSION (ICD-401.9)  controlled at  moment will monitor closely.  His updated medication list for this problem includes:    Carvedilol 25 Mg Tabs (Carvedilol) .Marland Kitchen... Take one tablet by mouth twice a day    Accuretic 20-25 Mg Tabs (Quinapril-hydrochlorothiazide) .Marland Kitchen... Take 1 tab daily    Accupril 10 Mg Tabs (Quinapril hcl) .Marland Kitchen... Take 1 tab daily in pm  Orders: [facility]- Est  Level 4 (84132)  Complete Medication  List: 1)  Metformin Hcl 500 Mg Xr24h-tab (Metformin hcl) .... 2 tabs qam..1 tab lunch time 2)  Aspirin 81 Mg Tbec (Aspirin) .... Take one tablet by mouth daily 3)  Carvedilol 25 Mg Tabs (Carvedilol) .... Take one tablet by mouth twice a day 4)  Nitrostat 0.4 Mg Subl (Nitroglycerin) .Marland Kitchen.. 1 tablet under tongue at onset of chest pain; you may repeat every 5 minutes for up to 3 doses. 5)  Bayer Contour Test Strp (Glucose blood) .... Use as directed 6)  Accuretic 20-25 Mg Tabs (Quinapril-hydrochlorothiazide) .... Take 1 tab daily 7)  Accupril 10 Mg Tabs (Quinapril hcl) .... Take 1 tab daily in pm 8)  Lipitor 40 Mg Tabs (Atorvastatin calcium) .... Take 1 tab by mouth once daily 9)  Multivitamins Tabs (Multiple vitamin) 10)  Folic Acid 1 Mg Tabs (Folic acid) 11)  Levemir 100 Unit/ml Soln (Insulin detemir) .Marland Kitchen.. 10 untis at bedtime 12)  Plavix 75 Mg Tabs (Clopidogrel bisulfate) .Marland Kitchen.. 1 tab daily for stents 13)  Lorazepam 0.5 Mg Tabs (Lorazepam) .Marland Kitchen.. 1 tab by mouth two times a day as needed for anxiety  Patient Instructions: 1)  Good to see you 2)  I am glad you are doing well 3)  I want you to take 325mg  of aspirin daily until you get the plavix, then just take the plavix. 4)  Follow up with your cardiologist as scheduled 5)  I am giving you some lorazepam to have on hand in case you get nervous. 6)  I want to see you again in 1 month Prescriptions: LEVEMIR 100 UNIT/ML SOLN (INSULIN DETEMIR) 10 untis at bedtime  #3 mo supply x 1   Entered and Authorized by:   [name] DO   Signed by:   [name] DO on 07/31/2010    Method used:   Faxed to ...       [facility] (retail)       [address] [place], [place]  [address]       Ph: [phone]       Fax: [phone]   RxID:   [id number] LORAZEPAM 0.5 MG TABS (LORAZEPAM) 1 tab by mouth two times a day as needed for anxiety  #60 x 0   Entered and Authorized by:   [name] DO   Signed by:   [name] DO on 07/31/2010   Method used:   Print then Give to Patient   RxID:   [id number] LEVEMIR 100 UNIT/ML SOLN (INSULIN DETEMIR) 10 untis at bedtime  #3 mo supply x 1   Entered and Authorized by:   [name] DO   Signed by:   [name] DO on 07/31/2010   Method used:   Electronically to        [facility] [phone]* (retail)       [address]       [place], [place]  [address]       Ph: [phone]       Fax: [phone]   RxID:   [id number]    Orders Added: 1)  [facility]- Est  Level 4 [57322]

## 2010-09-11 NOTE — Progress Notes (Signed)
Summary: meds  Phone Note Call from Patient Call back at [phone]   Summary of Call: had stints yesterday at [facility] and needs Plavix called in [facility] Initial call taken by: De Nurse,  July 30, 2010 4:01 PM  Follow-up for Phone Call        attempted to call pt.  Did call in medication but would like report of what happen.  I am guessing that they need a rx from me because he is in [employer] [place] and could not get meds from [facility] .  Follow-up by: [name] DO,  July 30, 2010 4:43 PM    New/Updated Medications: PLAVIX 75 MG TABS (CLOPIDOGREL BISULFATE) 1 tab daily for stents Prescriptions: PLAVIX 75 MG TABS (CLOPIDOGREL BISULFATE) 1 tab daily for stents  #31 x 6   Entered and Authorized by:   [name] DO   Signed by:   [name] DO on 07/30/2010   Method used:   Faxed to ...       [facility] (retail)       [address] [place], [place]  [address]       Ph: [phone]       Fax: [phone]   RxID:   [id number]

## 2010-10-23 LAB — GLUCOSE, CAPILLARY: Glucose-Capillary: 163 mg/dL — ABNORMAL HIGH (ref 70–99)

## 2010-10-29 LAB — GLUCOSE, CAPILLARY
Glucose-Capillary: 149 mg/dL — ABNORMAL HIGH (ref 70–99)
Glucose-Capillary: 154 mg/dL — ABNORMAL HIGH (ref 70–99)
Glucose-Capillary: 173 mg/dL — ABNORMAL HIGH (ref 70–99)
Glucose-Capillary: 194 mg/dL — ABNORMAL HIGH (ref 70–99)
Glucose-Capillary: 222 mg/dL — ABNORMAL HIGH (ref 70–99)
Glucose-Capillary: 223 mg/dL — ABNORMAL HIGH (ref 70–99)
Glucose-Capillary: 229 mg/dL — ABNORMAL HIGH (ref 70–99)
Glucose-Capillary: 234 mg/dL — ABNORMAL HIGH (ref 70–99)
Glucose-Capillary: 234 mg/dL — ABNORMAL HIGH (ref 70–99)
Glucose-Capillary: 246 mg/dL — ABNORMAL HIGH (ref 70–99)
Glucose-Capillary: 249 mg/dL — ABNORMAL HIGH (ref 70–99)
Glucose-Capillary: 260 mg/dL — ABNORMAL HIGH (ref 70–99)
Glucose-Capillary: 334 mg/dL — ABNORMAL HIGH (ref 70–99)

## 2010-10-29 LAB — BASIC METABOLIC PANEL
BUN: 11 mg/dL (ref 6–23)
BUN: 12 mg/dL (ref 6–23)
BUN: 16 mg/dL (ref 6–23)
CO2: 28 mEq/L (ref 19–32)
CO2: 28 mEq/L (ref 19–32)
CO2: 29 mEq/L (ref 19–32)
Calcium: 8.6 mg/dL (ref 8.4–10.5)
Calcium: 8.7 mg/dL (ref 8.4–10.5)
Calcium: 8.7 mg/dL (ref 8.4–10.5)
Chloride: 101 mEq/L (ref 96–112)
Chloride: 102 mEq/L (ref 96–112)
Chloride: 99 mEq/L (ref 96–112)
Creatinine, Ser: 0.97 mg/dL (ref 0.4–1.5)
Creatinine, Ser: 0.99 mg/dL (ref 0.4–1.5)
Creatinine, Ser: 1.02 mg/dL (ref 0.4–1.5)
GFR calc Af Amer: >60 mL/min (ref >60)
GFR calc Af Amer: >60 mL/min (ref >60)
GFR calc Af Amer: >60 mL/min (ref >60)
GFR calc non Af Amer: >60 mL/min (ref >60)
GFR calc non Af Amer: >60 mL/min (ref >60)
GFR calc non Af Amer: >60 mL/min (ref >60)
Glucose, Bld: 184 mg/dL — ABNORMAL HIGH (ref 70–99)
Glucose, Bld: 225 mg/dL — ABNORMAL HIGH (ref 70–99)
Glucose, Bld: 251 mg/dL — ABNORMAL HIGH (ref 70–99)
Potassium: 3.5 mEq/L (ref 3.5–5.1)
Potassium: 3.9 mEq/L (ref 3.5–5.1)
Potassium: 4.3 mEq/L (ref 3.5–5.1)
Sodium: 135 mEq/L (ref 135–145)
Sodium: 136 mEq/L (ref 135–145)
Sodium: 137 mEq/L (ref 135–145)

## 2010-10-29 LAB — CBC
HCT: 37.8 % — ABNORMAL LOW (ref 39.0–52.0)
HCT: 40 % (ref 39.0–52.0)
HCT: 40.6 % (ref 39.0–52.0)
HCT: 42.2 % (ref 39.0–52.0)
Hemoglobin: 12.6 g/dL — ABNORMAL LOW (ref 13.0–17.0)
Hemoglobin: 13.3 g/dL (ref 13.0–17.0)
Hemoglobin: 13.6 g/dL (ref 13.0–17.0)
Hemoglobin: 14.2 g/dL (ref 13.0–17.0)
MCHC: 33.2 g/dL (ref 30.0–36.0)
MCHC: 33.4 g/dL (ref 30.0–36.0)
MCHC: 33.4 g/dL (ref 30.0–36.0)
MCHC: 33.7 g/dL (ref 30.0–36.0)
MCV: 82.2 fL (ref 78.0–100.0)
MCV: 82.2 fL (ref 78.0–100.0)
MCV: 82.3 fL (ref 78.0–100.0)
MCV: 82.9 fL (ref 78.0–100.0)
Platelets: 237 10*3/uL (ref 150–400)
Platelets: 245 10*3/uL (ref 150–400)
Platelets: 262 10*3/uL (ref 150–400)
Platelets: 262 10*3/uL (ref 150–400)
RBC: 4.59 MIL/uL (ref 4.22–5.81)
RBC: 4.87 MIL/uL (ref 4.22–5.81)
RBC: 4.9 MIL/uL (ref 4.22–5.81)
RBC: 5.12 MIL/uL (ref 4.22–5.81)
RDW: 14.5 % (ref 11.5–15.5)
RDW: 14.7 % (ref 11.5–15.5)
RDW: 14.8 % (ref 11.5–15.5)
RDW: 15.3 % (ref 11.5–15.5)
WBC: 5.4 10*3/uL (ref 4.0–10.5)
WBC: 5.8 10*3/uL (ref 4.0–10.5)
WBC: 6.4 10*3/uL (ref 4.0–10.5)
WBC: 7.1 10*3/uL (ref 4.0–10.5)

## 2010-10-29 LAB — POCT I-STAT, CHEM 8
BUN: 12 mg/dL (ref 6–23)
Calcium, Ion: 1.16 mmol/L (ref 1.12–1.32)
Chloride: 96 mEq/L (ref 96–112)
Creatinine, Ser: 1.1 mg/dL (ref 0.4–1.5)
Glucose, Bld: 325 mg/dL — ABNORMAL HIGH (ref 70–99)
HCT: 47 % (ref 39.0–52.0)
Hemoglobin: 16 g/dL (ref 13.0–17.0)
Potassium: 4.3 mEq/L (ref 3.5–5.1)
Sodium: 146 mEq/L — ABNORMAL HIGH (ref 135–145)
TCO2: 34 mmol/L (ref 0–100)

## 2010-10-29 LAB — TSH: TSH: 0.976 u[IU]/mL (ref 0.350–4.500)

## 2010-10-29 LAB — DIFFERENTIAL
Basophils Absolute: 0 10*3/uL (ref 0.0–0.1)
Basophils Absolute: 0.1 10*3/uL (ref 0.0–0.1)
Basophils Relative: 0 % (ref 0–1)
Basophils Relative: 1 % (ref 0–1)
Eosinophils Absolute: 0.2 10*3/uL (ref 0.0–0.7)
Eosinophils Absolute: 0.2 10*3/uL (ref 0.0–0.7)
Eosinophils Relative: 3 % (ref 0–5)
Eosinophils Relative: 3 % (ref 0–5)
Lymphocytes Relative: 33 % (ref 12–46)
Lymphocytes Relative: 34 % (ref 12–46)
Lymphs Abs: 2.1 10*3/uL (ref 0.7–4.0)
Lymphs Abs: 2.4 10*3/uL (ref 0.7–4.0)
Monocytes Absolute: 0.7 10*3/uL (ref 0.1–1.0)
Monocytes Absolute: 0.7 10*3/uL (ref 0.1–1.0)
Monocytes Relative: 10 % (ref 3–12)
Monocytes Relative: 9 % (ref 3–12)
Neutro Abs: 3.4 10*3/uL (ref 1.7–7.7)
Neutro Abs: 3.7 10*3/uL (ref 1.7–7.7)
Neutrophils Relative %: 53 % (ref 43–77)
Neutrophils Relative %: 53 % (ref 43–77)

## 2010-10-29 LAB — COMPREHENSIVE METABOLIC PANEL
ALT: 20 U/L (ref 0–53)
AST: 22 U/L (ref 0–37)
Albumin: 3.6 g/dL (ref 3.5–5.2)
Alkaline Phosphatase: 54 U/L (ref 39–117)
BUN: 11 mg/dL (ref 6–23)
CO2: 33 mEq/L — ABNORMAL HIGH (ref 19–32)
Calcium: 9.1 mg/dL (ref 8.4–10.5)
Chloride: 98 mEq/L (ref 96–112)
Creatinine, Ser: 1.04 mg/dL (ref 0.4–1.5)
GFR calc Af Amer: >60 mL/min (ref >60)
GFR calc non Af Amer: >60 mL/min (ref >60)
Glucose, Bld: 264 mg/dL — ABNORMAL HIGH (ref 70–99)
Potassium: 4.5 mEq/L (ref 3.5–5.1)
Sodium: 136 mEq/L (ref 135–145)
Total Bilirubin: 0.8 mg/dL (ref 0.3–1.2)
Total Protein: 8 g/dL (ref 6.0–8.3)

## 2010-10-29 LAB — LIPID PANEL
Cholesterol: 169 mg/dL (ref 0–200)
HDL: 27 mg/dL — ABNORMAL LOW (ref >39)
LDL Cholesterol: 68 mg/dL (ref 0–99)
Total CHOL/HDL Ratio: 6.3 RATIO
Triglycerides: 368 mg/dL — ABNORMAL HIGH (ref <150)
VLDL: 74 mg/dL — ABNORMAL HIGH (ref 0–40)

## 2010-10-29 LAB — CK TOTAL AND CKMB (NOT AT ARMC)
CK, MB: 3.7 ng/mL (ref 0.3–4.0)
Relative Index: 3.4 — ABNORMAL HIGH (ref 0.0–2.5)
Total CK: 108 U/L (ref 7–232)

## 2010-10-29 LAB — HEMOGLOBIN A1C
Hgb A1c MFr Bld: 12.3 % — ABNORMAL HIGH (ref 4.6–6.1)
Mean Plasma Glucose: 306 mg/dL

## 2010-10-29 LAB — RAPID URINE DRUG SCREEN, HOSP PERFORMED
Amphetamines: NOT DETECTED
Barbiturates: NOT DETECTED
Benzodiazepines: NOT DETECTED
Cocaine: NOT DETECTED
Opiates: NOT DETECTED
Tetrahydrocannabinol: NOT DETECTED

## 2010-10-29 LAB — POCT CARDIAC MARKERS
CKMB, poc: 2.1 ng/mL (ref 1.0–8.0)
CKMB, poc: 3.5 ng/mL (ref 1.0–8.0)
Myoglobin, poc: 110 ng/mL (ref 12–200)
Myoglobin, poc: 68.5 ng/mL (ref 12–200)
Troponin i, poc: <0.05 ng/mL (ref 0.00–0.09)
Troponin i, poc: <0.05 ng/mL (ref 0.00–0.09)

## 2010-10-29 LAB — TROPONIN I: Troponin I: 0.03 ng/mL (ref 0.00–0.06)

## 2010-10-29 LAB — CARDIAC PANEL(CRET KIN+CKTOT+MB+TROPI)
CK, MB: 3.5 ng/mL (ref 0.3–4.0)
CK, MB: 4 ng/mL (ref 0.3–4.0)
Relative Index: 2.6 — ABNORMAL HIGH (ref 0.0–2.5)
Relative Index: 2.8 — ABNORMAL HIGH (ref 0.0–2.5)
Total CK: 137 U/L (ref 7–232)
Total CK: 145 U/L (ref 7–232)
Troponin I: 0.01 ng/mL (ref 0.00–0.06)
Troponin I: 0.03 ng/mL (ref 0.00–0.06)

## 2010-11-11 ENCOUNTER — Emergency Department (HOSPITAL_COMMUNITY): Payer: Self-pay

## 2010-11-11 ENCOUNTER — Emergency Department (HOSPITAL_COMMUNITY)
Admission: EM | Admit: 2010-11-11 | Discharge: 2010-11-11 | Disposition: A | Discharge status: Home / Self Care | Payer: Self-pay | Attending: Emergency Medicine | Admitting: Emergency Medicine

## 2010-11-11 DIAGNOSIS — R0602 Shortness of breath: Secondary | ICD-10-CM | POA: Insufficient documentation

## 2010-11-11 DIAGNOSIS — I10 Essential (primary) hypertension: Secondary | ICD-10-CM | POA: Insufficient documentation

## 2010-11-11 DIAGNOSIS — Z79899 Other long term (current) drug therapy: Secondary | ICD-10-CM | POA: Insufficient documentation

## 2010-11-11 DIAGNOSIS — E119 Type 2 diabetes mellitus without complications: Secondary | ICD-10-CM | POA: Insufficient documentation

## 2010-11-11 DIAGNOSIS — I2510 Atherosclerotic heart disease of native coronary artery without angina pectoris: Secondary | ICD-10-CM | POA: Insufficient documentation

## 2010-11-11 DIAGNOSIS — R079 Chest pain, unspecified: Secondary | ICD-10-CM | POA: Insufficient documentation

## 2010-11-11 LAB — CBC
HCT: 36.7 % — ABNORMAL LOW (ref 39.0–52.0)
Hemoglobin: 12 g/dL — ABNORMAL LOW (ref 13.0–17.0)
MCH: 26.2 pg (ref 26.0–34.0)
MCHC: 32.7 g/dL (ref 30.0–36.0)
MCV: 80.1 fL (ref 78.0–100.0)
Platelets: 305 10*3/uL (ref 150–400)
RBC: 4.58 MIL/uL (ref 4.22–5.81)
RDW: 15 % (ref 11.5–15.5)
WBC: 8.6 10*3/uL (ref 4.0–10.5)

## 2010-11-11 LAB — BASIC METABOLIC PANEL
BUN: 12 mg/dL (ref 6–23)
CO2: 30 mEq/L (ref 19–32)
Calcium: 9.2 mg/dL (ref 8.4–10.5)
Chloride: 99 mEq/L (ref 96–112)
Creatinine, Ser: 0.98 mg/dL (ref 0.4–1.5)
GFR calc Af Amer: >60 mL/min (ref >60)
GFR calc non Af Amer: >60 mL/min (ref >60)
Glucose, Bld: 108 mg/dL — ABNORMAL HIGH (ref 70–99)
Potassium: 3.6 mEq/L (ref 3.5–5.1)
Sodium: 135 mEq/L (ref 135–145)

## 2010-11-11 LAB — POCT CARDIAC MARKERS
CKMB, poc: 5.6 ng/mL (ref 1.0–8.0)
CKMB, poc: 3.7 ng/mL (ref 1.0–8.0)
Myoglobin, poc: 180 ng/mL (ref 12–200)
Myoglobin, poc: 119 ng/mL (ref 12–200)
Troponin i, poc: <0.05 ng/mL (ref 0.00–0.09)
Troponin i, poc: <0.05 ng/mL (ref 0.00–0.09)

## 2010-11-11 LAB — URINALYSIS, ROUTINE W REFLEX MICROSCOPIC
Bilirubin Urine: NEGATIVE
Glucose, UA: NEGATIVE mg/dL
Hgb urine dipstick: NEGATIVE
Ketones, ur: NEGATIVE mg/dL
Nitrite: NEGATIVE
Protein, ur: NEGATIVE mg/dL
Specific Gravity, Urine: 1.021 (ref 1.005–1.030)
Urobilinogen, UA: 1 mg/dL (ref 0.0–1.0)
pH: 6.5 (ref 5.0–8.0)

## 2010-11-11 LAB — DIFFERENTIAL
Basophils Absolute: 0 10*3/uL (ref 0.0–0.1)
Basophils Relative: 1 % (ref 0–1)
Eosinophils Absolute: 0.7 10*3/uL (ref 0.0–0.7)
Eosinophils Relative: 8 % — ABNORMAL HIGH (ref 0–5)
Lymphocytes Relative: 33 % (ref 12–46)
Lymphs Abs: 2.9 10*3/uL (ref 0.7–4.0)
Monocytes Absolute: 0.6 10*3/uL (ref 0.1–1.0)
Monocytes Relative: 7 % (ref 3–12)
Neutro Abs: 4.4 10*3/uL (ref 1.7–7.7)
Neutrophils Relative %: 51 % (ref 43–77)

## 2010-11-18 ENCOUNTER — Ambulatory Visit (INDEPENDENT_AMBULATORY_CARE_PROVIDER_SITE_OTHER): Payer: Self-pay | Admitting: Family Medicine

## 2010-11-18 ENCOUNTER — Encounter: Payer: Self-pay | Admitting: Family Medicine

## 2010-11-18 VITALS — BP 145/93 | HR 75 | Temp 98.3°F | Ht 71.0 in | Wt 171.8 lb

## 2010-11-18 DIAGNOSIS — I10 Essential (primary) hypertension: Secondary | ICD-10-CM

## 2010-11-18 DIAGNOSIS — E119 Type 2 diabetes mellitus without complications: Secondary | ICD-10-CM

## 2010-11-18 DIAGNOSIS — Z87898 Personal history of other specified conditions: Secondary | ICD-10-CM

## 2010-11-18 MED ORDER — METFORMIN HCL ER 500 MG PO TB24: 500.0000 mg | ORAL_TABLET | ORAL | Status: DC

## 2010-11-18 NOTE — Patient Instructions (Signed)
It was great to see you today! I am glad that you went to the ER to be checked out and I'm also glad that they didn't find anything.  It looks like your heart is fine, which is really good news! I am giving you a refill on your metformin.  As we discussed, due to the change in our computer system, we have to use paper prescriptions for the health department. I suggest you get back in to see Dr. Katrinka Blazing to talk about your insulin.  When you come in to talk with him about that, please bring in a log of your blood sugars.

## 2010-11-25 DIAGNOSIS — Z87898 Personal history of other specified conditions: Secondary | ICD-10-CM | POA: Insufficient documentation

## 2010-11-25 HISTORY — DX: Personal history of other specified conditions: Z87.898

## 2010-11-25 NOTE — Assessment & Plan Note (Signed)
Will provide the patient with a refill on his Coreg today. Blood pressure today is slightly higher than goal. If this persists, we may consider adding or titrating medication doses.

## 2010-11-25 NOTE — Progress Notes (Signed)
Subjective: The patient is a 40 year old male who presents today for hospital follow-up. He recently was seen at the [facility] emergency department for weakness and left-sided chest pain. He reports that this was following an episode of overexertion doing outdoor activity. He was observed, had cereal troponin strong, and had an EKG performed. Upon completion of his cardiac rule out, he was discharged home with instructions to see his primary care provider in approximately one week. Since his discharge, he has had no additional problems with chest pain, tightness, shortness of breath, cough, leg swelling, or abdominal pain.  The patient has no additional complaints, however he requests refills on his carvedilol and metformin. He has some questions about whether it would be appropriate for him to change his dose of levemir. He does not have any particular reason for wanting to do this other than the fact that he has lost some weight.  Objective:  Filed Vitals:   11/18/10 1448  BP: 145/93  Pulse: 75  Temp: 98.3 F (36.8 C)   Gen: NAD, alert, cooperative with exam HEENT: MMM, EOMI, PERRL CV: RRR, no MRG Resp: CTABL, no wheezes noted Abd: SNTND, BS present, no guarding or organomegaly Ext: No edema noted, full ROM

## 2010-11-25 NOTE — Assessment & Plan Note (Signed)
Will provide the patient with refills on his metformin. This we provided as a printed script as he uses the [facility] health Department. Questions about his insulin dosing will be deferred for discussion with his primary care provider.

## 2010-12-23 NOTE — Assessment & Plan Note (Signed)
[facility]                           PRIMARY CARE OFFICE NOTE   Paul [name], Paul [name]                    MRN:          [id number]  DATE:01/17/2007                            DOB:          October 15, 1970    CHIEF COMPLAINT:  New patient to practice/type 2 diabetes.   HISTORY OF PRESENT ILLNESS:  Patient is a 40 year old African American  male here to establish primary care. He is followed by Dr. Geralynn Rile  of [facility]. He suffered an anterior myocardial infarction in  March of 2007. He was treated with a drug-eluting stents to LAD and  circumflex. He has been somewhat noncompliant and has not followed up  with his cardiologist for 1 year.  He recently saw Dr. Samule Ohm who noted  he did not follow up with his primary care physician for type 2  diabetes. When he was hospitalized in March of 2007 he stated he had  diabetes since 2005 however, he was not taking any medications, and a  hemoglobin A1C at that time was 15. There is some concern that he also  has not been compliant with his blood pressure medications with recent  blood pressure in Dr. Melinda Crutch office of 170/120.   He does not check his blood sugars on a daily basis. He was under the  impression that simvastatin was his diabetes medication. It appears from  Dr. Melinda Crutch notes that he may have been on metformin but unclear dose.  He does not have this medication with him today. He was previously on  Lantus insulin up to 30 units during his last hospitalization. 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   He denies any current issues with chest pain or shortness of breath. He  has a strong family history of coronary artery disease with mother dying  at the age of 18 after undergoing CABG.   He does convey understanding of diabetic diet and he states that he  tries to avoid starchy foods/carbohydrates,  however, he routinely  consumes concentrated sweets.   PAST MEDICAL HISTORY SUMMARY:  1. Coronary artery disease, status post drug-eluting stent to the mid      LAD and circumflex. His last echocardiogram noted normal ejection      fraction of 54%.  2. Hypertension.  3. Hyperlipidemia.  4. Severe diabetes presumed type 2.  5. Medical noncompliance.   CURRENT MEDICATIONS:  1. Metoprolol XL 50 mg once a day.  2. Plavix 75 mg once a day.  3. Lisinopril 20 mg once a day.  4. Simvastatin 80 mg once a day.  5. Clonidine 0.25 mg b.i.d.   ALLERGIES TO MEDICATIONS:  None known.   SOCIAL HISTORY:  The patient is originally is from [place], [place].  He has been married for 1 year and is working as a Naval architect. He has  4 children from his previous marriage.   FAMILY HISTORY:  As noted above. Father deceased at young age secondary  to homicide.   HABITS:  Rare alcohol, denies any history of  tobacco abuse, or  recreational drug use.   REVIEW OF SYSTEMS:  No HEENT symptoms. Denies any chest pain, shortness  of breath. No heartburn, nausea, vomiting, constipation, diarrhea. He  has polyuria, polydipsia. All other systems negative.   No recent kidney function,  follow up LFTs were performed which was  within normal limits, and recent cholesterol panel revealed direct LDL  of 84.9.   CBG in the office was 481.   PHYSICAL EXAMINATION:  VITAL SIGNS: Weight is 187 pounds, temperature is  98.2, pulse is 67, blood pressure is 145/99 in the left arm in a seated  position.  IN GENERAL: The patient was a pleasant well-developed, well-nourished,  40 year old African American male no apparent distress.  HEENT: Normocephalic, atraumatic. Pupils equal, and reactive to light  bilaterally. Extraocular motility was intact. Patient was anicteric.  Conjunctivae was within normal limits. External auditory canals and  tympanic membranes were clear bilaterally. Oral pharyngeal exam was   unremarkable.  NECK: Supple without any evidence of adenopathy or carotid bruits.  CHEST EXAM: Normal respiratory effort. Chest was clear to auscultation  bilaterally, no rhonchi, rales, or wheezing.  CARDIOVASCULAR: Regular rate and rhythm. No significant murmurs, rubs,  or gallops appreciated.  ABDOMEN: Soft, nontender, positive bowel sounds. No organomegaly.  MUSCULOSKELETAL EXAM: No clubbing, cyanosis, or edema. Patient had  palpable pedis dorsalis pulses.  NEUROLOGICALLY: Cranial nerves II-XII grossly intact. He was nonfocal.   IMPRESSION/RECOMMENDATIONS:  1. Type 2 diabetes, uncontrolled.  2. Medical non compliance.  3. Coronary artery disease, status post acute myocardial infarction      with drug-eluting stent to mid LAD and circumflex.  4. Hypertension, suboptimally controlled.  5. Hyperlipidemia.  6. Health maintenance.   RECOMMENDATIONS:  We discussed the need for further diabetic teaching.  He was given Agricultural engineer today. We discussed probable need for  insulin considering the severity of his hyperglycemia. As further work  up for his diabetes, we will order a C-peptide level and anti GAD  antibody to consider the possibility of type 1 diabetes.   He was given samples of Levemir but he felt uncomfortable with starting  insulin therapy today and we will  defer this until he receives insulin  teaching from the diabetic educator.   We arranged follow up labs today including basic metabolic profile,  hemoglobin Z6X, and microalbumin, creatinine ratio.  He may not be able  to use oral agents if his kidney function is abnormal.   Patient needs ophthalmology consult for diabetic eye exam.   Follow up visit - 1 week.     Barbette Hair. Artist Pais, DO  Electronically Signed    RDY/MedQ  DD: 01/17/2007  DT: 01/18/2007  Job #: [id number]

## 2010-12-23 NOTE — Assessment & Plan Note (Signed)
[facility]                            CARDIOLOGY OFFICE NOTE   Paul [name], Paul [name]                    MRN:          [id number]  DATE:12/16/2006                            DOB:          1971/04/26    HISTORY OF PRESENT ILLNESS:  Paul [name] is a 40 year old gentleman with  diabetes and hypertension.  Diabetes was diagnosed in 2005.  He suffered  anterior myocardial infarction in March 2007.  I placed drug eluting  stents in his LAD and circumflex.  He also has 95% stenosis in the  codominant right PDA.  That vessel was quite small and we have managed  it medically.   He has missed an appointment last summer, and thus, was lost to followup  temporarily.  Fortunately, he follows up now.  He says he has been  compliant with his medications.  However, we have called his pharmacy  [facility] today, and his last refills were in January for 3 months  supply.  It is thus questionable whether he is taking his medicines.  When discussed with him, however, he says that he takes them each day.   He states he has no primary care doctor, he cannot recall who recently  prescribed his metformin.   He denies any chest discomfort, PND, orthopnea, edema, visual  difficulty, chest pain, dyspnea, and headache.  He says he is feeling  very well.   CURRENT MEDICATIONS:  His current medications are stated to be:  1. Aspirin 325 mg daily.  2. Plavix 75 mg daily.  3. Simvastatin 80 mg each evening.  4. Lisinopril 20 mg daily.  5. Metoprolol 50 mg twice daily.  6. Metformin dose unclear each day.   PHYSICAL EXAMINATION:  GENERAL:  He is generally well-appearing in no  distress.  VITAL SIGNS:  Heart rate 74, blood pressure 170/120, equal bilaterally  and stable on recheck after 15 minutes.  He weighs 190 pounds.  NECK:  He  has no jugular venous distention, thyromegaly,  lymphadenopathy.  Carotid pulses are 2+ bilaterally without bruit.  RESPIRATORY:  Respiratory  effort is normal.  Lungs are clear to  auscultation.  CARDIOVASCULAR:  He has a nondisplaced point of maximal cardiac impulse.  There is regular rate and rhythm without murmur, rub, or gallop.  ABDOMEN:  Soft, nondistended, nontender.  There is no  hepatosplenomegaly.  Bowel sounds are normal.  EXTREMITIES:  Warm without clubbing, cyanosis, edema, or ulceration.  Femoral pulses 2+ bilaterally.   DIAGNOSTIC STUDIES:  Electrocardiogram demonstrates normal sinus rhythm  with septal infarct pattern and lateral and apical T-wave inversions.   IMPRESSION/RECOMMENDATIONS:  1. Hypertension:  Blood pressure is very severely elevated.      Fortunately, he is asymptomatic from this.  We did discuss      hospitalization.  He was resistant to this.  Since I think this is      unlikely to be new and he is thoroughly asymptomatic, I think we      can probably handle this as an outpatient.  I gave him 0.2 mg of      clonidine while he was  in the office.  I have arranged for him to      be seen in our office tomorrow for a repeat blood pressure check.      We have given him a prescription for clonidine 0.2 mg  twice daily.      I emphasized the critical importance of control of his blood      pressure and let him know that if it remains at this level      tomorrow, we will need to hospitalize him.  We will plan on      evaluation for secondary causes when he follows up.  2. Coronary artery disease:  Prior myocardial infarction with the drug      eluting stents in the left anterior descending and the circumflex.      Continue beta blocker, aspirin and Plavix and angiotensin-      converting enzyme-inhibitor.  3. Diabetes mellitus:  No primary care physician.  He says he does      wish to establish with one.  Referred to Dr. Thomos Lemons.  4. Hypercholesterolemia:  Continue simvastatin 80 mg daily.   He will follow up in our office tomorrow.  I stressed the critical  importance of this.  I offered to  write him a letter to stay out of work  to avoid any conflicts.  He told me that this would be unnecessary.     Salvadore Farber, MD  Electronically Signed    WED/MedQ  DD: 12/16/2006  DT: 12/17/2006  Job #: [id number]

## 2010-12-23 NOTE — Assessment & Plan Note (Signed)
[facility]                            CARDIOLOGY OFFICE NOTE   Paul [name], Paul [name]                    MRN:          [id number]  DATE:12/16/2006                            DOB:          [birth year]    HISTORY OF PRESENT ILLNESS:  Paul [name] is a 40 year old gentleman with  Type 2 diabetes mellitus diagnosed in 1995. He presented with anterior  myocardial infarction in March of 2007. I have placed a drug-eluting  stent in the mid LAD. He was also noted to have severe stenosis of the  distal circumflex leading into a left PDA. This was also treated with a  drug-eluting stent. His ejection fraction at that time was 54%.   Since his myocardial infarction, he has had no recurrent angina,  exertional dyspnea, orthopnea, PND, or edema. He has had no  palpitations, syncope or pre-syncope. He works as a Charity fundraiser.  He missed an appointment in June of last year and now presents today. He  states that he has no primary care doctor, but seems to be getting his  medications from somewhere and indicates a request for referral to a  primary care physician.   CURRENT MEDICATIONS:  1. Aspirin 325 mg daily.  2. Simvastatin 80 mg each evening.  3. Lisinopril 20 mg daily.  4. Plavix 75 mg daily.  5. Metoprolol 50 mg twice daily.  6. Metformin dose unclear.   PHYSICAL EXAMINATION:  He is generally well-appearing in no distress  with __________.   INCOMPLETE DICTATION.     Salvadore Farber, MD     WED/MedQ  DD: 12/16/2006  DT: 12/16/2006  Job #: [id number]   cc:   Barbette Hair. Artist Pais, DO

## 2010-12-26 NOTE — H&P (Signed)
NAMEZVI, DUPLANTIS NO.:  [id number]   MEDICAL RECORD NO.:  [id number]          PATIENT TYPE:  INP   LOCATION:  2312                         FACILITY:  [facility]   PHYSICIAN:  Pricilla Riffle, M.D.    DATE OF BIRTH:  [birth year]   DATE OF ADMISSION:  10/19/2005  DATE OF DISCHARGE:                                HISTORY & PHYSICAL   CARDIOLOGIST:  None.   PRIMARY CARE PHYSICIAN:  None.   CHIEF COMPLAINT:  Head and chest pain.   HISTORY OF PRESENT ILLNESS:  This 40 year old African-American male with  history of hypertension, diabetes referred here from [facility] because of  an ST elevation MI.  Girlfriend states he has been having chest pain  approximately three months.  Tonight he was arguing with his girlfriend  about 10 p.m.  Developed chest pain.  Trying to lay down and pounding on his  chest with no relief.  Associated symptoms nausea,vomiting, shortness of  breath.  No diaphoresis.  No PND, orthopnea, lower extremity edema.  Girlfriend took to [employer].  EKG showed ST elevation.  Therefore, code  STEMI was called.  He was therefore emergently transferred to [facility] for  intervention.  He has no known drug allergies.   MEDICATIONS:  Blood pressure medicine, diabetes medicine.  He cannot tell me  the names of them.   PAST MEDICAL HISTORY:  1.  Diabetes.  2.  Hypertension.  3.  Hyperlipidemia all managed by urgent care.   SOCIAL HISTORY:  Lives in [place] with his girlfriend.  He is a Ecologist, currently unemployed.  No tobacco, alcohol, or drugs.   FAMILY HISTORY:  Mother died at 39 from a CABG.  Had history of coronary  artery disease.  Father died before he was born.  Two sisters, one brother  who are healthy, living in [place].   REVIEW OF SYSTEMS:  Remarkable for chest pain, nausea, vomiting.   PHYSICAL EXAMINATION:  VITAL SIGNS:  Blood pressure elevated at 178/136,  pulse 86.  GENERAL:  Alert and oriented x3, complaining of chest  pain.  HEENT:  No increased AP.  No carotid bruits.  No thyromegaly.  CHEST:  Clear to auscultation bilaterally.  CARDIOVASCULAR:  Positive S4.  Normal S1, S2.  No murmurs, rubs, or gallops.  ABDOMEN:  Soft, nontender, nondistended.  Positive bowel sounds.  EXTREMITIES:  No clubbing, cyanosis, edema.  Strong radial and femoral pedal  pulses.   Chest x-ray is currently pending.  EKG showing rate of 102, sinus rhythm,  axis 43, PR 162, QRS 84, QTC of 411.  He has ST elevation about 1 mm in V1,  2 mm in V2.   LABORATORIES:  White count 7.4, H&H 15/46, platelets 305.  Other  laboratories are currently pending.   ASSESSMENT/PLAN:  1.  ST elevation myocardial infarction.  Patient taken directly to the      catheterization laboratory.  Catheterization showing mid left anterior      descending occlusion as well as significant circumflex in the mid      region.  Left anterior descending intervened upon.  Will manage him      medically with metoprolol, lisinopril, aspirin, Plavix, and Statin.  2.  Hypertension.  Titrate ACE inhibitor, beta blocker.  Add Norvasc if      needed.  3.  Diabetes mellitus.  Check a hemoglobin A1c, TSH, place on sliding scale      insulin.  4.  Hyperlipidemia.  Check a lipid panel, place on Statin.  5.  He is full code.     ______________________________  Leonard Downing. Pernell Dupre, M.D.    ______________________________  Pricilla Riffle, M.D.    GLA/MEDQ  D:  10/19/2005  T:  10/20/2005  Job:  [id number]

## 2010-12-26 NOTE — Cardiovascular Report (Signed)
NAME:  NAFTULA, DONAHUE NO.:  [id number]   MEDICAL RECORD NO.:  [id number]          PATIENT TYPE:  INP   LOCATION:  6527                         FACILITY:  [facility]   PHYSICIAN:  Salvadore Farber, M.D. LHCDATE OF BIRTH:  Jun 04, 1971   DATE OF PROCEDURE:  10/22/2005  DATE OF DISCHARGE:                              CARDIAC CATHETERIZATION   PROCEDURE:  Drug-eluting stent placement in the mid-circumflex, a diagnostic  angiography of the RCA, Starclose closure of the right common femoral  arteriotomy site.   INDICATIONS:  Mr. Langlinais is a 40 year old gentleman with diabetes mellitus  and hypertension who presented with anterior myocardial infarction on October 19, 2005. He was treated with drug-eluting stent placement in the mid-LAD.  Postinfarct course has been uncomplicated. He had exertional angina prior to  his infarct. He was, at this time of infarct angiography, noted to have a  severe stenosis of the distal circumflex leading into a left PDA as well as  a questionable ostial RCA stenosis and a severe stenosis of the right PDA.  He returns to the lab today for further angiography of the RCA after the  administration of nitroglycerin to clarify the severity of both stenoses and  potentially treat them. We also planned percutaneous intervention of the  circumflex.   PROCEDURAL TECHNIQUE:  Informed consent was obtained. Under 1% lidocaine  local anesthesia, a 6-French sheath was placed in the right common femoral  artery using the modified Seldinger technique. Anticoagulation was initiated  with bivalirudin. ACT was confirmed to be greater than 225 seconds. A 6-  [place] JR-4 catheter was advanced over wire and engaged in the ostium of the  RCA. Intracoronary nitroglycerin was administered. Angiography was performed  by hand injection. This demonstrated there to be no significant ostial  stenosis. There was a severe PDA stenosis. However, the PDA distal to the  stenosis was approximately 1.5 mm in diameter even after the administration  of nitroglycerin. Decision was therefore made to manage this medically.   Attention was then turned the circumflex. A 6-French Voda left 3.5 guide was  advanced over wire and engaged in the ostial left main. A Prowater wire was  advanced to the distal left PDA without difficulty. I predilated using a  2.25 x 15 mm Maverick for 2 inflations at 6 atmospheres. I then attempted to  pass a 2.5 x 23 mm Cypher across the lesion but was unable to do so. I  performed additional predilation using the same balloon at 8 atmospheres. I  was then able to easily pass the Cypher stent. I deployed the stent at 12  atmospheres. I then postdilated the entirety of the stent using a 2.5 x 18  mm PowerSail for 2 inflations each at 16 atmospheres. Final angiography  demonstrated no residual stenosis, no dissection, and TIMI III flow to the  distal vasculature.   The arteriotomy was then closed using a Starclose device. Ancef had been  administered prior to deployment. Complete hemostasis was obtained. He was  transferred to holding room in stable condition.   COMPLICATIONS:  None.   FINDINGS:  1.  RCA:  Ostium is minimally diseased. There was a 90% stenosis of the PDA.      The vessel distal to this is      approximately 1.5 mm in diameter. We will manage it medically.  2.  Circumflex: Successful percutaneous intervention on the distal      circumflex treated using a drug-eluting stent.   IMPRESSION/PLAN:  This patient should be maintained on a combination of  aspirin and Plavix indefinitely.      Salvadore Farber, M.D. [facility]  Electronically Signed     WED/MEDQ  D:  10/22/2005  T:  10/23/2005  Job:  [id number]

## 2010-12-26 NOTE — Discharge Summary (Signed)
Paul [name], Paul [name]           ACCOUNT NO.:  [id number]   MEDICAL RECORD NO.:  [id number]          PATIENT TYPE:  INP   LOCATION:  6531                         FACILITY:  [facility]   PHYSICIAN:  Charlies Constable, M.D. [facility] DATE OF BIRTH:  [birth year]   DATE OF ADMISSION:  10/19/2005  DATE OF DISCHARGE:  10/23/2005                                 DISCHARGE SUMMARY   PROCEDURES:  1.  Cardiac catheterization.  2.  Coronary arteriogram.  3.  Left ventriculogram.  4.  Percutaneous transluminal coronary angioplasty and Cypher stent to one      vessel emergently.  5.  Recatheterization with percutaneous transluminal coronary angioplasty      and drug-eluting stent to a second vessel.   TIME OF DISCHARGE:  39 minutes.   PRIMARY DIAGNOSIS:  Acute anterior myocardial infarction with a Cypher stent  to the left anterior descending artery.   SECONDARY DIAGNOSES:  1.  Residual coronary artery disease in the right coronary artery and      circumflex with a drug-eluting stent to the circumflex, reducing that      stenosis from 90% to 0, 90% posterior descending artery stenosis in a      1.5 mm vessel, medical therapy.  2.  Mild left ventricular dysfunction with an ejection fraction of 54% at      catheterization.  3.  Diabetes with a hemoglobin A1c of 15 and Lantus insulin started this      admission.  4.  Hypertension.  5.  Hyperlipidemia with a total cholesterol 187, triglycerides 92, HDL of      31, LDL 138.  6.  Hyponatremia.  7.  Family history of coronary artery disease in his mother.   HOSPITAL COURSE:  Paul [name] is a 40 year old male with no known history of  coronary artery disease.  He developed chest pain on the night of admission  with associated nausea, vomiting and shortness of breath.  EKG showed ST  elevation at [facility] and a Code STEMI was called.  He was transferred  urgently to [facility] and taken to the catheterization lab.   He is LAD was totaled and treated with  a Cypher stent, reducing the stenosis  to 0.  Residual coronary artery disease was felt best management with staged  intervention.   He tolerated the procedure well.  As part of his evaluation, hemoglobin A1c  was checked and was significantly elevated at 15.  He was seen by the  diabetes coordinator.  An outpatient diabetes education referral was made.  He was started on Lantus insulin and is to follow up with his urgent care  physician as soon as possible.   As part of his evaluation a lipid profile was performed that showed  hyperlipidemia with an LDL 138.  He was started on Lipitor 80 mg initially  but this was changed to simvastatin 80 mg a day for cost savings.  He is to  get a follow-up lipid and liver profile in six to 12 weeks.   He had a history of hypertension and was started on a beta blocker.  For  cost savings, the initial Coreg was switched to metoprolol 25 b.i.d.  He was  also started on lisinopril and this was up-titrated to 20 mg a day.  His  systolic blood pressure is much better controlled now.   Paul [name] was seen by cardiac rehab and ambulated without chest pain or  shortness of breath.  He had a staged intervention to the circumflex with  another drug eluting stent but the PDA stenosis, although significant at  90%, was a small vessel at 1.5 mm and medical management was felt by Dr.  Samule Ohm to be the best option.   By October 23, 2005, Paul [name] was ambulating without chest pain or  shortness of breath.  He was cleared by for discharge by Dr. Juanda Chance with  outpatient follow-up arranged.   DISCHARGE INSTRUCTIONS:  1.  His activity level is to be increased slowly. He is not to drive for      week and no lifting for three weeks.  2.  He is to call our office for any problems with the catheterization site.  3.  He is to stick to a low-fat diabetic diet.   DISCHARGE MEDICATIONS:  1.  Coated aspirin 325 mg a day.  2.  Plavix 75 mg a day.  3.  Lantus 30 units  q.h.s.  4.  Nitroglycerin sublingual p.r.n.  5.  Lisinopril 20 mg a day.  6.  Simvastatin 80 mg a day.  7.  Metoprolol 25 mg b.i.d.      Theodore Demark, P.A. [facility]    ______________________________  Charlies Constable, M.D. [facility]    RB/MEDQ  D:  10/23/2005  T:  10/25/2005  Job:  [id number]   cc:   Salvadore Farber, M.D. [facility]  [address]  [address]  [place]  [place] [address]   [facility]

## 2010-12-26 NOTE — Cardiovascular Report (Signed)
Paul [name], [name] NO.:  [id number]   MEDICAL RECORD NO.:  [id number]          PATIENT TYPE:  INP   LOCATION:  2312                         FACILITY:  [facility]   PHYSICIAN:  Salvadore Farber, M.D. LHCDATE OF BIRTH:  1970-10-14   DATE OF PROCEDURE:  10/19/2005  DATE OF DISCHARGE:                              CARDIAC CATHETERIZATION   PROCEDURES:  1.  Left heart catheterization.  2.  Left ventriculography.  3.  Coronary angiography.  4.  Drug-eluting stent placement in the mid-LAD.  5.  Intravascular ultrasound of the LAD.  6.  StarClose closure of the right common femoral arteriotomy site.   INDICATIONS:  Paul [name] is a 40 year old gentleman with type 2 diabetes  mellitus, diagnosed approximately 2 years ago, as well as hypertension. He  has a family history of premature atherosclerotic disease. He does not  smoke. He has had episodic chest discomfort with exertion over the past 3  months. Tonight at 10 p.m. he developed substernal chest discomfort at rest.  He presented via EMS to the [facility] emergency room. There,  electrocardiogram demonstrated anterior ST elevations. He was treated with  aspirin, heparin, nitroglycerin, morphine -- without relief of his symptoms  or changes in his electrocardiogram. He was also noted to be profoundly  hypertensive (200/120). He was transferred to [facility] catheterization lab  for angiography and possible percutaneous coronary revascularization.   PROCEDURAL TECHNIQUE:  Informed consent was obtained. Under 1% lidocaine  local anesthesia, a 6-French sheath was placed in the right common femoral  artery using the modified Seldinger technique. Diagnostic angiography was  performed using JL-4 and JR-4 catheters. This demonstrated a lumbar  thrombotic occlusion of the mid-LAD to be the culprit lesion.   Labetalol was administered intravenously to lower the blood pressure, as was  nitroglycerin.  I debated  whether to treat with glycoprotein 2b3a inhibitor,  given his profound hypertension. Given his relatively young age, I felt the  risk of intracranial hemorrhage was very low, and the benefit of  glycoprotein 2b3a inhibitor outweighed the risks (despite his hypertension).  With that said, I worked diligently throughout the case to control his blood  pressure, and this was accomplished. Thus, a double bolus eptifibatide was  administered. Plavix 600 mg was administered as well. Additional heparin was  given throughout the case to maintain the ACT at greater than 200 seconds.   A 6-French CLS-3.5 guide was advanced over a wire and engaged into the  ostium of the left main. A Prowater wire was advanced to the distal LAD  without difficulty. I dilated the lesion using a 2.5 x 15 mm Maverick  balloon. With this, TIMI-III flow was established. The distal vessel was  seen to be very small, consistent with diffuse disease, spasm, or both. I  administered intracoronary nitroglycerin in several boluses, with some  improvement of the spasm. However, the vessel remained fairly diffusely  diseased. I certainly  could not place a larger stent in the 2.5 mm. I  therefore advanced a 2.5 x 23 mm Cyphe and positioned it across the lesion,  deploying it at 14  atmospheres. I postdilated it using a 2.5 x 18 mm  PowerSail for 2 inflations at 16 atmospheres.   I then performed intravascular ultrasound using the Atlantis SR-Pro catheter  and automated pullback. This demonstrated the entirety of the stent to be  well apposed and expanded. However, I felt that the proximal portion of the  stent could be further dilated safely. I therefore advanced a 3.0 x 15 mm  PowerSail, and performed additional post-dilation of the proximal stent at  16 atmospheres.   Final angiography demonstrated no residual stenosis in the stented segment.  At the distal margin of the stent there was approximately a 50% stenosis,  without  evidence of dissection. I was concerned that this may represent some  component of spasm. I therefore elected not to place an additional stent,  but rather to reassess at repeat catheterization in the days to come.   I then performed left heart catheterization and ventriculography using a  pigtail catheter. Finally, I close the arteriotomy site using a StarClose  device (after the administration of Ancef).   The patient tolerated the procedure well and was transferred to the  intensive care unit in stable condition.   COMPLICATIONS:  None.   FINDINGS:  1.  LV: 116/05/11.  2.  EF 54% with mild anterolateral hypokinesis.  3.  No aortic stenosis or mitral regurgitation.  4.  Left main: Angiographically normal.  5.  LAD: Moderate-sized vessel giving rise to 2 diagonal branches. The mid      LAD was totally occluded. This was stented to no residual. There remains      a 50% stenosis of the mid-LAD at the distal margin of the stent, and at      least a 70% stenosis of the more distal LAD. It is unclear how much of      this represents spasm. There was clearly a large composite spasm      throughout the case, which was transiently responsive to nitroglycerin,      but rapidly recurred. I am hopeful that a substantial component of the      very small vessels that he has is related to coronary spasm.  6.  Circumflex: Large vessel giving rise to a single obtuse marginal and      large posterior left ventricular branch. The marginal has 60% stenosis      distally. The distal AV groove circumflex has a 90% stenosis.  7.  RCA: Moderate-sized dominant vessel. There was damping around the 6-      J[place] catheter. It was unclear whether this represented coronary spasm      versus stenosis. In the interest of time, I did not administer      intracoronary nitroglycerin. In addition to this, there was a 90%      stenosis of the proximal portion of the PDA.   IMPRESSION/PLAN:  Successful  revascularization of the occluded LAD. He also  has severe PDA and PLV stenosis. The vessels are small and appear to be  diffusely diseased. Will plan on return to the lab for percutaneous  intervention on the PLV and PDA in the days to come. He should be maintained  on a combination of Plavix and aspirin indefinitely.      Salvadore Farber, M.D. [facility]  Electronically Signed     WED/MEDQ  D:  10/19/2005  T:  10/19/2005  Job:  [id number]

## 2011-01-09 ENCOUNTER — Other Ambulatory Visit: Payer: Self-pay | Admitting: Family Medicine

## 2011-01-09 NOTE — Telephone Encounter (Signed)
pts says the health dept no longer carries plavix, asking if we can send a new rx to [facility] [address]. Pt says the pharmacy cannot transfer the plavix/

## 2011-01-09 NOTE — Telephone Encounter (Signed)
To MD

## 2011-01-11 MED ORDER — CLOPIDOGREL BISULFATE 75 MG PO TABS: 75.0000 mg | ORAL_TABLET | Freq: Every day | ORAL | Status: DC

## 2011-01-11 NOTE — Telephone Encounter (Signed)
Sent in refill

## 2011-02-24 ENCOUNTER — Telehealth: Payer: Self-pay | Admitting: Family Medicine

## 2011-02-24 NOTE — Telephone Encounter (Signed)
Cannot afford his Plavix and would like to know if there is something else he can take or where he can get assistance with getting this medication.

## 2011-02-24 NOTE — Telephone Encounter (Signed)
Patient cannot afford brand name nor generic.  Was asking if we had any samples.  Told him we do not offer samples from this clinic but I would be willing to route this note to Dr. Katrinka Blazing and Dr. Raymondo Band to see if they had any suggestions for substitutions that are on the 4 dollar plan.

## 2011-02-24 NOTE — Telephone Encounter (Signed)
Called patient and shared Dr. Michaelle Copas advice.

## 2011-02-24 NOTE — Telephone Encounter (Signed)
I do not know of anything else at this time, if he cannot afford it then he needs to take at least 162 mg of aspirin daily but would encourage plavix.  Pt does have drug eluting stents. He can also call his cardiologist this may be a better option.

## 2011-03-11 ENCOUNTER — Emergency Department (HOSPITAL_COMMUNITY)
Admission: EM | Admit: 2011-03-11 | Discharge: 2011-03-11 | Disposition: A | Discharge status: Home / Self Care | Payer: Self-pay | Attending: Emergency Medicine | Admitting: Emergency Medicine

## 2011-03-11 DIAGNOSIS — I10 Essential (primary) hypertension: Secondary | ICD-10-CM | POA: Insufficient documentation

## 2011-03-11 DIAGNOSIS — Z79899 Other long term (current) drug therapy: Secondary | ICD-10-CM | POA: Insufficient documentation

## 2011-03-11 DIAGNOSIS — Z7982 Long term (current) use of aspirin: Secondary | ICD-10-CM | POA: Insufficient documentation

## 2011-03-11 DIAGNOSIS — E119 Type 2 diabetes mellitus without complications: Secondary | ICD-10-CM | POA: Insufficient documentation

## 2011-03-11 DIAGNOSIS — F411 Generalized anxiety disorder: Secondary | ICD-10-CM | POA: Insufficient documentation

## 2011-03-11 DIAGNOSIS — I2510 Atherosclerotic heart disease of native coronary artery without angina pectoris: Secondary | ICD-10-CM | POA: Insufficient documentation

## 2011-03-11 DIAGNOSIS — M25539 Pain in unspecified wrist: Secondary | ICD-10-CM | POA: Insufficient documentation

## 2011-03-11 LAB — GLUCOSE, CAPILLARY: Glucose-Capillary: 111 mg/dL — ABNORMAL HIGH (ref 70–99)

## 2011-04-30 ENCOUNTER — Encounter: Payer: Self-pay | Admitting: Family Medicine

## 2011-04-30 ENCOUNTER — Encounter: Payer: Self-pay | Admitting: Home Health Services

## 2011-04-30 ENCOUNTER — Ambulatory Visit (INDEPENDENT_AMBULATORY_CARE_PROVIDER_SITE_OTHER): Payer: Self-pay | Admitting: Family Medicine

## 2011-04-30 VITALS — BP 117/79 | HR 79 | Temp 98.3°F | Wt 166.0 lb

## 2011-04-30 DIAGNOSIS — Z955 Presence of coronary angioplasty implant and graft: Secondary | ICD-10-CM | POA: Insufficient documentation

## 2011-04-30 DIAGNOSIS — Z23 Encounter for immunization: Secondary | ICD-10-CM

## 2011-04-30 DIAGNOSIS — E119 Type 2 diabetes mellitus without complications: Secondary | ICD-10-CM

## 2011-04-30 DIAGNOSIS — I10 Essential (primary) hypertension: Secondary | ICD-10-CM

## 2011-04-30 DIAGNOSIS — I2510 Atherosclerotic heart disease of native coronary artery without angina pectoris: Secondary | ICD-10-CM

## 2011-04-30 DIAGNOSIS — Z9861 Coronary angioplasty status: Secondary | ICD-10-CM

## 2011-04-30 DIAGNOSIS — F411 Generalized anxiety disorder: Secondary | ICD-10-CM

## 2011-04-30 LAB — POCT GLYCOSYLATED HEMOGLOBIN (HGB A1C): Hemoglobin A1C: 7.2

## 2011-04-30 MED ORDER — INSULIN DETEMIR 100 UNIT/ML ~~LOC~~ SOLN: 11.0000 [IU] | Freq: Every day | SUBCUTANEOUS | Status: DC

## 2011-04-30 MED ORDER — CLOPIDOGREL BISULFATE 75 MG PO TABS: 75.0000 mg | ORAL_TABLET | Freq: Every day | ORAL | Status: DC

## 2011-04-30 MED ORDER — LORAZEPAM 0.5 MG PO TABS: 0.5000 mg | ORAL_TABLET | Freq: Three times a day (TID) | ORAL | Status: DC | PRN

## 2011-04-30 NOTE — Progress Notes (Signed)
  Subjective:    Patient ID: Paul [name], male    DOB: 17-May-1971, 40 y.o.   MRN: [id number]  HPI Patient has been having a hard time. 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 Patient states that he does dysmenorrhea is but unfortunately once asked about his medical history he is very honest with them and feels that it's due to his heart disease that he does not do the job. She states at this point he has had no chest pain and has been doing well otherwise but is feeling little bit down and overall in very anxious about the future. Patient denies any type of suicidal or homicidal ideation and denies that he is depressed. Just frustrated  1. Hypertension Blood pressure at home:not checking Blood pressure today: 117/79 Taking Meds:yes Side effects:no ROS: Denies headache visual changes nausea, vomiting, chest pain or abdominal pain or shortness of breath.   Diabetes:  High at home:not checking Low at home:not checking Taking medications: on levimir and metformin Side effects:no ROS: denies fever, chills, dizziness, loss of conscieness, polyuria poly dipsia numbness or tingling in extremities or chest pain.  Lipids-  Supposed to be on lipitor and is taking it.  Patient denies any type of muscle pain has been taking the medicine in the morning because he forgets about at night.  CAD- patient has had 4 stents 2 in 2007 and 2 in 2011 patient states that he's been feeling relatively well but has not been followed up with cardiologist because now the cardiologist is in [facility] and he has no transportation to get out there. Patient would be willing to see another cardiologist here  Review of Systems Denies fever, chills, nausea vomiting abdominal pain, dysuria, chest pain, shortness of breath dyspnea on exertion or numbness in extremities Past medical history, social, surgical and family history all reviewed.      Objective:   Physical Exam Gen: NAD, alert, cooperative with exam HEENT: MMM, EOMI, PERRL CV: RRR, no MRG Resp: CTABL, no wheezes noted Abd: SNTND, BS present, no guarding or organomegaly Ext: No edema noted, full ROM    Assessment & Plan:

## 2011-04-30 NOTE — Progress Notes (Signed)
Addended by: Garen Grams F on: 04/30/2011 12:27 PM   Modules accepted: Orders

## 2011-04-30 NOTE — Assessment & Plan Note (Signed)
Patient appears frustrated and anxious today. Overall coping with his hardships fairly well. Will refill Xanax at this time due to financial constraints he has discussed this with Dennison Nancy and Luella Cook will help him get some financial assistance to get Ativan here for the next 6 months.

## 2011-04-30 NOTE — Assessment & Plan Note (Signed)
Patient is at goal not change any of the medications at this time is able to afford some of the medications will have cardiology way and on care as well.

## 2011-04-30 NOTE — Assessment & Plan Note (Signed)
Patient's A1c has shown improvement to 7.2 we'll continue Levemir but increase it to 11 units at night. We will recheck an A1c in 3 months

## 2011-04-30 NOTE — Patient Instructions (Addendum)
It is good see you I am going to have you talk to Dennison Nancy on getting a little help with your medications I want you to sign up for the MAP program again I will get you to a cardiologist in the area to see you and get a stress test.  I want to see you again in 3 months for sure but if I can help in any other way please don't hesitate to call .  Lab Results  Component Value Date   HGBA1C 7.2 04/30/2011  Your doing great with your blood sugars lets have you increase your levimir to 11 units at night.

## 2011-04-30 NOTE — Assessment & Plan Note (Signed)
The patient has had significant or problems in the past and is unable to followup with cardiologist and I points we'll refer him to cardiologist here in [facility] for easier access. Patient likely should have a stress test here due to the team about a year since his last stent placement

## 2011-05-04 ENCOUNTER — Encounter: Payer: Self-pay | Admitting: *Deleted

## 2011-05-06 ENCOUNTER — Telehealth: Payer: Self-pay | Admitting: Home Health Services

## 2011-05-06 NOTE — Telephone Encounter (Signed)
On 9/20 $39.70 was given to pt, Paul [name] for a prescription of Plavix and Ativan.   Arrangements were made for pick up at [facility] and pt was given form to be faxed back to [place] for verification of medication pick up.    On 9/26 we had not received fax and I follow up with phone call to [facility] pharmacy.  Pharmacist confirmed that pt picked up plavix for $28.12 but that a prescription for Ativan was never filled.     I tried calling pt with phone number on record for follow up and it has been disconnected.    Pt was informed that this was a one time [employer] help but that he needed to reapply to MAP program for future prescriptions.

## 2011-05-07 ENCOUNTER — Telehealth: Payer: Self-pay | Admitting: Family Medicine

## 2011-05-07 ENCOUNTER — Other Ambulatory Visit: Payer: Self-pay | Admitting: Family Medicine

## 2011-05-07 MED ORDER — QUINAPRIL HCL 10 MG PO TABS: 10.0000 mg | ORAL_TABLET | Freq: Every day | ORAL | Status: DC

## 2011-05-07 MED ORDER — QUINAPRIL-HYDROCHLOROTHIAZIDE 20-25 MG PO TABS: 1.0000 | ORAL_TABLET | Freq: Every day | ORAL | Status: DC

## 2011-05-08 NOTE — Telephone Encounter (Signed)
Patient came in office yesterday needing printed Rx for accupril/ HCTZ and plain accupril. Dr. Leveda Anna printed rx. Patient wants to check around different pharmacies to find least expensive.

## 2011-05-21 ENCOUNTER — Ambulatory Visit (INDEPENDENT_AMBULATORY_CARE_PROVIDER_SITE_OTHER): Payer: Self-pay | Admitting: Cardiology

## 2011-05-21 ENCOUNTER — Encounter: Payer: Self-pay | Admitting: Cardiology

## 2011-05-21 DIAGNOSIS — I255 Ischemic cardiomyopathy: Secondary | ICD-10-CM

## 2011-05-21 DIAGNOSIS — E119 Type 2 diabetes mellitus without complications: Secondary | ICD-10-CM

## 2011-05-21 DIAGNOSIS — I2510 Atherosclerotic heart disease of native coronary artery without angina pectoris: Secondary | ICD-10-CM | POA: Insufficient documentation

## 2011-05-21 DIAGNOSIS — I10 Essential (primary) hypertension: Secondary | ICD-10-CM | POA: Insufficient documentation

## 2011-05-21 DIAGNOSIS — E785 Hyperlipidemia, unspecified: Secondary | ICD-10-CM

## 2011-05-21 NOTE — Assessment & Plan Note (Signed)
His LDL is at target.

## 2011-05-21 NOTE — Patient Instructions (Signed)
Your physician has requested that you have an exercise tolerance test. For further information please visit [email or web address]. Please also follow instruction sheet, as given.  The current medical regimen is effective;  continue present plan and medications.  

## 2011-05-21 NOTE — Assessment & Plan Note (Signed)
I encouraged him to follow with services in town that might be able to help him with his medications.  No change in therapy is indicated.

## 2011-05-21 NOTE — Assessment & Plan Note (Signed)
I will bring the patient back for a POET (Plain Old Exercise Test). This will allow me to screen for new obstructive coronary disease, risk stratify and very importantly provide a prescription for exercise.

## 2011-05-21 NOTE — Progress Notes (Signed)
HPI The patient presents for evaluation of CAD.  He has a history of this.  We last him in 2011.  We have record of stents in 2001.  When we saw him in 2011 he had atypical chest pain. And did not have catheterization though he reports having further stenting last year. I don't have these records.  The last echo shows a mildly reduced ejection fraction of 40-45%. He reports also being evaluated in the emergency room for what amounted to anxiety. I do see the last clinic note mentions problems with anxiety. Today he is not describing any chest pressure, neck or arm discomfort. He is not describing any new shortness of breath, PND or orthopnea. He is not mentioning any palpitations, presyncope or syncope. He has no weight gain or edema. He is anxious trying to get a job and wonders if his heart disease will limit him. He has been out of his ACE inhibitor. Money is a significant problem for purchasing his medications.  No Known Allergies  Current Outpatient Prescriptions  Medication Sig Dispense Refill  . aspirin 81 MG EC tablet Take 81 mg by mouth daily.        Marland Kitchen atorvastatin (LIPITOR) 40 MG tablet Take 40 mg by mouth daily.        . carvedilol (COREG) 25 MG tablet Take 25 mg by mouth 2 (two) times daily with meals.        . clopidogrel (PLAVIX) 75 MG tablet Take 1 tablet (75 mg total) by mouth daily.  93 tablet  1  . folic acid (FOLVITE) 1 MG tablet Take 1 mg by mouth.        Marland Kitchen glucose blood (BAYER CONTOUR TEST) test strip Use as directed       . insulin detemir (LEVEMIR) 100 UNIT/ML injection Inject 11 Units into the skin at bedtime. Please give the pen and 3 months supply.   Thank you  10 mL  3  . LORazepam (ATIVAN) 0.5 MG tablet Take 1 tablet (0.5 mg total) by mouth every 8 (eight) hours as needed for anxiety.  90 tablet  0  . metFORMIN (GLUCOPHAGE-XR) 500 MG 24 hr tablet Take 1 tablet (500 mg total) by mouth as directed. Take 2 tablets by mouth every morning; Take one tablet by mouth at lunch  90  tablet  3  . Multiple Vitamin (MULTIVITAMIN) tablet Take 1 tablet by mouth daily.        . nitroGLYCERIN (NITROSTAT) 0.4 MG SL tablet Place 0.4 mg under the tongue every 5 (five) minutes as needed.        . quinapril (ACCUPRIL) 10 MG tablet Take 1 tablet (10 mg total) by mouth at bedtime.  30 tablet  3  . quinapril-hydrochlorothiazide (ACCURETIC) 20-25 MG per tablet Take 1 tablet by mouth daily.  30 tablet  3  . triamcinolone (KENALOG) 0.1 % cream Apply topically 3 (three) times daily.        Marland Kitchen DISCONTD: quinapril-hydrochlorothiazide (ACCURETIC) 20-25 MG per tablet Take 1 tablet by mouth daily.  30 tablet  3    Past Medical History  Diagnosis Date  . Hypertension   . CAD (coronary artery disease)     Anterior MI 2007 with DES stenting of an occluded LAD, distal 70% stenosis, OM 60% stenosis, circumflex 90% stenosis treated with a drug-eluting stent, PA 90% stenosis treated medically.  . Diabetes mellitus   . Hyperlipidemia   . Cardiomyopathy, ischemic     EF 45%  No past surgical history on file.  ROS:  As stated in the HPI and negative for all other systems.  PHYSICAL EXAM BP 167/109  Pulse 75  Ht 5\' 11"  (1.803 m)  Wt 172 lb 12.8 oz (78.382 kg)  BMI 24.10 kg/m2 GENERAL:  Well appearing HEENT:  Pupils equal round and reactive, fundi not visualized, oral mucosa unremarkable NECK:  No jugular venous distention, waveform within normal limits, carotid upstroke brisk and symmetric, no bruits, no thyromegaly LYMPHATICS:  No cervical, inguinal adenopathy LUNGS:  Clear to auscultation bilaterally BACK:  No CVA tenderness CHEST:  Unremarkable HEART:  PMI not displaced or sustained,S1 and S2 within normal limits, no S3, no S4, no clicks, no rubs, no murmurs ABD:  Flat, positive bowel sounds normal in frequency in pitch, no bruits, no rebound, no guarding, no midline pulsatile mass, no hepatomegaly, no splenomegaly EXT:  2 plus pulses throughout, no edema, no cyanosis no clubbing SKIN:   No rashes no nodules NEURO:  Cranial nerves II through XII grossly intact, motor grossly intact throughout PSYCH:  Cognitively intact, oriented to person place and time  EKG:  Normal sinus rhythm, rate 77, old anterior MI.  No acute ST T wave changes  ASSESSMENT AND PLAN

## 2011-05-21 NOTE — Assessment & Plan Note (Signed)
I will defer to his primary team.  He needs social resources to help get his meds.  We had no ACE inhibitor samples in our office.  He is on appropriate medications.

## 2011-06-08 ENCOUNTER — Telehealth: Payer: Self-pay | Admitting: Family Medicine

## 2011-06-08 NOTE — Telephone Encounter (Signed)
Needs documentation that TB Xray was clear for employment.  Please call him when this is ready.

## 2011-06-09 NOTE — Telephone Encounter (Signed)
Called and left message on patient voicemail that I didn't see where he had a recent tb test but if all he needed was a copy of his chest xray done in April it was up front ready to be picked up.

## 2011-06-19 ENCOUNTER — Encounter: Payer: Self-pay | Admitting: Cardiology

## 2011-06-24 ENCOUNTER — Ambulatory Visit (INDEPENDENT_AMBULATORY_CARE_PROVIDER_SITE_OTHER): Payer: Self-pay | Admitting: Cardiology

## 2011-06-24 DIAGNOSIS — I2510 Atherosclerotic heart disease of native coronary artery without angina pectoris: Secondary | ICD-10-CM

## 2011-06-24 DIAGNOSIS — I10 Essential (primary) hypertension: Secondary | ICD-10-CM

## 2011-06-24 NOTE — Patient Instructions (Signed)
Your physician recommends that you schedule a follow-up appointment in: 2 months with Tereso Newcomer, PA-C  Continue current medications

## 2011-06-24 NOTE — Progress Notes (Signed)
Exercise Treadmill Test  Pre-Exercise Testing Evaluation Rhythm: normal sinus  Rate: 77   PR:  .14 QRS:  .09  QT:  .38 QTc: 43     Test  Exercise Tolerance Test Ordering MD: Angelina Sheriff, MD  Interpreting MD:  Angelina Sheriff, MD  Unique Test No: 1  Treadmill:  1  Indication for ETT: known ASHD  Contraindication to ETT: No   Stress Modality: exercise - treadmill  Cardiac Imaging Performed: non   Protocol: standard Bruce - maximal  Max BP:  191/117  Max MPHR (bpm):  180 85% MPR (bpm):  153  MPHR obtained (bpm):  121 % MPHR obtained:  68  Reached 85% MPHR (min:sec):  NA Total Exercise Time (min-sec):  3:15  Workload in METS:  4.9 Borg Scale: 13  Reason ETT Terminated:  exaggerated hypertensive response    ST Segment Analysis At Rest: normal ST segments - no evidence of significant ST depression With Exercise: no evidence of significant ST depression  Other Information Arrhythmia:  Yes Angina during ETT:  absent (0) Quality of ETT:  non-diagnostic  ETT Interpretation:  normal - no evidence of ischemia by ST analysis  Comments: Test stopped because of hypertensive BP response.  The patient has not been taking all of his meds and did not take them this morning.  He did get some dyspnea and mild ventricular ectopy.  There were no ischemic ST T wave changes.  Recommendations: He needs to get the meds filled and he has a plan for this.  He can be seen in about one month to check his BP and see how he feels on his meds.

## 2011-06-26 ENCOUNTER — Other Ambulatory Visit: Payer: Self-pay | Admitting: Family Medicine

## 2011-06-26 NOTE — Telephone Encounter (Signed)
Refill request

## 2011-07-27 ENCOUNTER — Other Ambulatory Visit: Payer: Self-pay | Admitting: Family Medicine

## 2011-07-27 MED ORDER — ATORVASTATIN CALCIUM 40 MG PO TABS: 40.0000 mg | ORAL_TABLET | Freq: Every day | ORAL | Status: DC

## 2011-07-27 MED ORDER — INSULIN PEN NEEDLE 32G X 6 MM MISC: Status: DC

## 2011-07-27 MED ORDER — INSULIN DETEMIR 100 UNIT/ML ~~LOC~~ SOLN: 11.0000 [IU] | Freq: Every day | SUBCUTANEOUS | Status: DC

## 2011-07-27 MED ORDER — QUINAPRIL-HYDROCHLOROTHIAZIDE 20-25 MG PO TABS: 1.0000 | ORAL_TABLET | Freq: Every day | ORAL | Status: DC

## 2011-07-27 MED ORDER — METFORMIN HCL ER 500 MG PO TB24: 1000.0000 mg | ORAL_TABLET | Freq: Every day | ORAL | Status: DC

## 2011-07-27 MED ORDER — NITROGLYCERIN 0.4 MG SL SUBL: 0.4000 mg | SUBLINGUAL_TABLET | SUBLINGUAL | Status: DC | PRN

## 2011-07-28 ENCOUNTER — Other Ambulatory Visit: Payer: Self-pay | Admitting: Family Medicine

## 2011-07-28 NOTE — Telephone Encounter (Signed)
Refill request

## 2011-07-31 NOTE — Telephone Encounter (Signed)
His drug store only dispenses 60 pills for a month of his Metformin and that isn't enough to last him for the month.  He would like Dr. Katrinka Blazing to switch it to [facility] instead.

## 2011-08-02 MED ORDER — METFORMIN HCL 500 MG PO TABS: 1000.0000 mg | ORAL_TABLET | Freq: Two times a day (BID) | ORAL | Status: DC

## 2011-08-02 NOTE — Telephone Encounter (Signed)
Done

## 2011-08-24 ENCOUNTER — Ambulatory Visit (INDEPENDENT_AMBULATORY_CARE_PROVIDER_SITE_OTHER): Payer: Self-pay | Admitting: Physician Assistant

## 2011-08-24 ENCOUNTER — Ambulatory Visit (HOSPITAL_COMMUNITY)
Admission: RE | Admit: 2011-08-24 | Discharge: 2011-08-24 | Disposition: A | Discharge status: Home / Self Care | Payer: Self-pay | Source: Ambulatory Visit | Attending: Physician Assistant | Admitting: Physician Assistant

## 2011-08-24 ENCOUNTER — Encounter: Payer: Self-pay | Admitting: Physician Assistant

## 2011-08-24 ENCOUNTER — Encounter: Payer: Self-pay | Admitting: *Deleted

## 2011-08-24 VITALS — BP 142/70 | HR 68 | Resp 18 | Ht 71.0 in | Wt 170.0 lb

## 2011-08-24 DIAGNOSIS — I255 Ischemic cardiomyopathy: Secondary | ICD-10-CM

## 2011-08-24 DIAGNOSIS — I2589 Other forms of chronic ischemic heart disease: Secondary | ICD-10-CM

## 2011-08-24 DIAGNOSIS — I2510 Atherosclerotic heart disease of native coronary artery without angina pectoris: Secondary | ICD-10-CM

## 2011-08-24 DIAGNOSIS — R079 Chest pain, unspecified: Secondary | ICD-10-CM

## 2011-08-24 DIAGNOSIS — E119 Type 2 diabetes mellitus without complications: Secondary | ICD-10-CM

## 2011-08-24 DIAGNOSIS — I10 Essential (primary) hypertension: Secondary | ICD-10-CM

## 2011-08-24 DIAGNOSIS — R0602 Shortness of breath: Secondary | ICD-10-CM | POA: Insufficient documentation

## 2011-08-24 LAB — CBC WITH DIFFERENTIAL/PLATELET
Basophils Absolute: 0 10*3/uL (ref 0.0–0.1)
Basophils Relative: 0.2 % (ref 0.0–3.0)
Eosinophils Absolute: 0.2 10*3/uL (ref 0.0–0.7)
Eosinophils Relative: 2.9 % (ref 0.0–5.0)
HCT: 40.4 % (ref 39.0–52.0)
Hemoglobin: 13.3 g/dL (ref 13.0–17.0)
Lymphocytes Relative: 39.7 % (ref 12.0–46.0)
Lymphs Abs: 2.1 10*3/uL (ref 0.7–4.0)
MCHC: 33 g/dL (ref 30.0–36.0)
MCV: 82.1 fl (ref 78.0–100.0)
Monocytes Absolute: 0.4 10*3/uL (ref 0.1–1.0)
Monocytes Relative: 8.2 % (ref 3.0–12.0)
Neutro Abs: 2.6 10*3/uL (ref 1.4–7.7)
Neutrophils Relative %: 49 % (ref 43.0–77.0)
Platelets: 227 10*3/uL (ref 150.0–400.0)
RBC: 4.92 Mil/uL (ref 4.22–5.81)
RDW: 15.8 % — ABNORMAL HIGH (ref 11.5–14.6)
WBC: 5.3 10*3/uL (ref 4.5–10.5)

## 2011-08-24 LAB — PROTIME-INR
INR: 1.1 ratio — ABNORMAL HIGH (ref 0.8–1.0)
Prothrombin Time: 12.1 s (ref 10.2–12.4)

## 2011-08-24 LAB — BASIC METABOLIC PANEL
BUN: 12 mg/dL (ref 6–23)
CO2: 31 mEq/L (ref 19–32)
Calcium: 8.9 mg/dL (ref 8.4–10.5)
Chloride: 95 mEq/L — ABNORMAL LOW (ref 96–112)
Creatinine, Ser: 1 mg/dL (ref 0.4–1.5)
GFR: 110.97 mL/min (ref >60.00)
Glucose, Bld: 134 mg/dL — ABNORMAL HIGH (ref 70–99)
Potassium: 4 mEq/L (ref 3.5–5.1)
Sodium: 132 mEq/L — ABNORMAL LOW (ref 135–145)

## 2011-08-24 MED ORDER — HYDRALAZINE HCL 25 MG PO TABS: 25.0000 mg | ORAL_TABLET | Freq: Three times a day (TID) | ORAL | Status: DC

## 2011-08-24 MED ORDER — ISOSORBIDE MONONITRATE ER 30 MG PO TB24: 30.0000 mg | ORAL_TABLET | Freq: Every day | ORAL | Status: DC

## 2011-08-24 MED ORDER — NITROGLYCERIN 0.4 MG SL SUBL: 0.4000 mg | SUBLINGUAL_TABLET | SUBLINGUAL | Status: DC | PRN

## 2011-08-24 NOTE — Assessment & Plan Note (Signed)
Add hydralazine and nitrates to medical regimen as noted.  Continue ACE inhibitor and beta blocker.

## 2011-08-24 NOTE — Patient Instructions (Addendum)
Your physician recommends that you schedule a follow-up appointment in: after your cath Your physician has recommended you make the following change in your medication: START Imdur 30 mg daily and Hydralazine 25 mg three times a day KEEP Nitro on hand for chest pain A chest x-ray takes a picture of the organs and structures inside the chest, including the heart, lungs, and blood vessels. This test can show several things, including, whether the heart is enlarges; whether fluid is building up in the lungs; and whether pacemaker / defibrillator leads are still in place. HOLD Metformin tonight and 48 hours after your cath

## 2011-08-24 NOTE — Assessment & Plan Note (Signed)
Cardiac catheterization arranged for tomorrow.  Begin holding metformin tonight.

## 2011-08-24 NOTE — Progress Notes (Signed)
[address] [place], [place]  [address] Phone: [phone] Fax:  [phone]  Date:  08/24/2011   Name:  Paul [name]       DOB:  07/02/1971 MRN:  [id number]  PCP:  Dr. [name]  Primary Cardiologist:  Dr. Rollene Rotunda  Primary Electrophysiologist:  None    History of Present Illness: Paul [name] is a 41 y.o. male who presents for follow up.  He has a h/o CAD, s/p anterior STEMI 3/07 tx with DES to mLAD and staged DES to dCFX, ischemic cardiomyopathy, HTN, DM2 and hyperlipidemia.  LHC 3/07: EF 54%, Mid LAD occluded (treated with DES), then 50%, distal LAD 70%, distal OM 60%, distal AV circumflex 90%, small PDA with proximal 90%; distal circumflex treated with staged PCI with DES.  Echocardiogram 2/11: Mild LVH, EF 40-45%, apical akinesis, mid to distal septal akinesis, mild MR.  The patient was evaluated by Dr. Antoine Poche in 10/12.  His blood pressure was significantly elevated.  The patient had been out of his medications.  He was set up for a routine treadmill test to screen for progression of CAD.  This was performed 06/24/11.  However, it was discontinued early secondary to accelerated hypertension.  He was encouraged to get back on his medications for blood pressure and follow up with me today.  He is back on medications.  He is somewhat of a poor historian.  He notes chest tightness and dyspnea with exertion over the last couple of months.  Not getting worse.  Improves with rest.  Notes some tingling in fingers on left.  No jaw pain.  Does have nausea but no diaphoresis.  No syncope.  No orthopnea or PND or significant edema.  Notes fatigue as well.  Working as Advertising copywriter.  Gets significantly exhausted with this.  Feels like symptoms remind him of prior angina but not as bad.    Past Medical History  Diagnosis Date  . Hypertension   . CAD (coronary artery disease)     Anterior MI 2007 with DES stenting of an occluded LAD, distal 70%  stenosis, OM 60% stenosis, circumflex 90% stenosis treated with a drug-eluting stent, PA 90% stenosis treated medically.  . Diabetes mellitus   . Hyperlipidemia   . Cardiomyopathy, ischemic     EF 45%    Current Outpatient Prescriptions  Medication Sig Dispense Refill  . aspirin 81 MG EC tablet Take 81 mg by mouth daily.        Marland Kitchen atorvastatin (LIPITOR) 40 MG tablet Take 1 tablet (40 mg total) by mouth daily.  90 tablet  2  . carvedilol (COREG) 25 MG tablet TAKE ONE TABLET BY MOUTH TWICE DAILY  60 tablet  3  . clopidogrel (PLAVIX) 75 MG tablet Take 1 tablet (75 mg total) by mouth daily.  93 tablet  1  . folic acid (FOLVITE) 1 MG tablet Take 1 mg by mouth.        Marland Kitchen glucose blood (BAYER CONTOUR TEST) test strip Use as directed       . insulin detemir (LEVEMIR) 100 UNIT/ML injection Inject 11 Units into the skin at bedtime. Please give the pen and 3 months supply.   Thank you  10 mL  3  . Insulin Pen Needle 32G X 6 MM MISC To be used with the Levemir flex pen  100 each  6  . LORazepam (ATIVAN) 0.5 MG tablet Take 1 tablet (0.5 mg total) by mouth every 8 (eight) hours as  needed for anxiety.  90 tablet  0  . metFORMIN (GLUCOPHAGE) 500 MG tablet Take 2 tablets (1,000 mg total) by mouth 2 (two) times daily with a meal.  360 tablet  3  . Multiple Vitamin (MULTIVITAMIN) tablet Take 1 tablet by mouth daily.        . nitroGLYCERIN (NITROSTAT) 0.4 MG SL tablet Place 1 tablet (0.4 mg total) under the tongue every 5 (five) minutes as needed.  15 tablet  0  . quinapril (ACCUPRIL) 10 MG tablet Take 1 tablet (10 mg total) by mouth at bedtime.  30 tablet  3  . quinapril-hydrochlorothiazide (ACCURETIC) 20-25 MG per tablet Take 1 tablet by mouth daily.  90 tablet  3  . triamcinolone (KENALOG) 0.1 % cream Apply topically 3 (three) times daily.          Allergies: No Known Allergies  History  Substance Use Topics  . Smoking status: Never Smoker   . Smokeless tobacco: Not on file  . Alcohol Use: Not on file       ROS:  Please see the history of present illness.   Has had some GI upset with simvastatin and now on Lipitor.  All other systems reviewed and negative.   PHYSICAL EXAM: VS:  BP 142/70  Pulse 68  Resp 18  Ht 5\' 11"  (1.803 m)  Wt 170 lb (77.111 kg)  BMI 23.71 kg/m2 Repeat BP by me 170/100 bilaterally  Well nourished, well developed, in no acute distress HEENT: normal Neck: no JVD Vascular: no carotid bruits Endocrine: no thyromegaly Cardiac:  normal S1, S2; RRR; no murmur Lungs:  clear to auscultation bilaterally, no wheezing, rhonchi or rales Abd: soft, nontender, no hepatomegaly Ext: no edema Skin: warm and dry Neuro:  CNs 2-12 intact, no focal abnormalities noted Psych: normal affect  EKG:   Sinus rhythm, heart rate 68, normal axis, T wave inversions in leads 1, aVL, Q waves V1-V3, no significant change when compared with prior tracings  ASSESSMENT AND PLAN:

## 2011-08-24 NOTE — Assessment & Plan Note (Signed)
Blood pressure remains uncontrolled.  Add hydralazine 25 mg 3 times a day and isosorbide 30 mg daily.

## 2011-08-24 NOTE — Assessment & Plan Note (Addendum)
He is having exertional symptoms that are somewhat similar to prior angina.  BP also remains uncontrolled.  Adjust BP meds as noted.  Discussed with Dr. Cassell Clement.  Will arrange cardiac cath.  Risks and benefits of cardiac catheterization have been discussed with the patient.  These include bleeding, infection, kidney damage, stroke, heart attack, death.  The patient understands these risks and is willing to proceed.  He knows to go to ED if symptoms worsen.

## 2011-08-24 NOTE — Progress Notes (Signed)
The patient was discussed with Scott Weaver PA.  The patient is a poor historian but his symptoms are concerning for recurrent angina.  I feel that the best way to delineate his present problem and guide future therapy will be to proceed with cardiac catheterization.  This will be done in the near future.  Shatasha Lambing  

## 2011-08-24 NOTE — Assessment & Plan Note (Signed)
Continue aspirin, Plavix and statin.  Proceed with cardiac catheterization as noted.

## 2011-08-25 ENCOUNTER — Ambulatory Visit (HOSPITAL_COMMUNITY)
Admission: RE | Admit: 2011-08-25 | Discharge: 2011-08-25 | Disposition: A | Discharge status: Home / Self Care | Payer: Self-pay | Source: Ambulatory Visit | Attending: Cardiology | Admitting: Cardiology

## 2011-08-25 ENCOUNTER — Other Ambulatory Visit: Payer: Self-pay

## 2011-08-25 ENCOUNTER — Encounter (HOSPITAL_COMMUNITY)
Admission: RE | Disposition: A | Discharge status: Home / Self Care | Payer: Self-pay | Source: Ambulatory Visit | Attending: Cardiology

## 2011-08-25 DIAGNOSIS — I2510 Atherosclerotic heart disease of native coronary artery without angina pectoris: Secondary | ICD-10-CM

## 2011-08-25 DIAGNOSIS — I10 Essential (primary) hypertension: Secondary | ICD-10-CM | POA: Insufficient documentation

## 2011-08-25 DIAGNOSIS — R079 Chest pain, unspecified: Secondary | ICD-10-CM

## 2011-08-25 DIAGNOSIS — I739 Peripheral vascular disease, unspecified: Secondary | ICD-10-CM

## 2011-08-25 DIAGNOSIS — Z9861 Coronary angioplasty status: Secondary | ICD-10-CM | POA: Insufficient documentation

## 2011-08-25 HISTORY — PX: OTHER SURGICAL HISTORY: SHX169

## 2011-08-25 HISTORY — PX: LEFT HEART CATHETERIZATION WITH CORONARY ANGIOGRAM: SHX5451

## 2011-08-25 LAB — GLUCOSE, CAPILLARY
Glucose-Capillary: 92 mg/dL (ref 70–99)
Glucose-Capillary: 84 mg/dL (ref 70–99)

## 2011-08-25 SURGERY — LEFT HEART CATHETERIZATION WITH CORONARY ANGIOGRAM
Anesthesia: LOCAL

## 2011-08-25 MED ORDER — NITROGLYCERIN 0.2 MG/ML ON CALL CATH LAB
INTRAVENOUS | Status: AC
  Filled 2011-08-25: qty 1

## 2011-08-25 MED ORDER — LABETALOL HCL 5 MG/ML IV SOLN
10.0000 mg | Freq: Once | INTRAVENOUS | Status: AC
  Administered 2011-08-25: 10 mg via INTRAVENOUS

## 2011-08-25 MED ORDER — MIDAZOLAM HCL 2 MG/2ML IJ SOLN
INTRAMUSCULAR | Status: AC
  Filled 2011-08-25: qty 2

## 2011-08-25 MED ORDER — LIDOCAINE HCL (PF) 1 % IJ SOLN
INTRAMUSCULAR | Status: AC
  Filled 2011-08-25: qty 30

## 2011-08-25 MED ORDER — ENALAPRILAT 1.25 MG/ML IV SOLN
INTRAVENOUS | Status: AC
  Filled 2011-08-25: qty 1

## 2011-08-25 MED ORDER — ACETAMINOPHEN 325 MG PO TABS: 650.0000 mg | ORAL_TABLET | ORAL | Status: DC | PRN

## 2011-08-25 MED ORDER — SODIUM CHLORIDE 0.9 % IV SOLN
INTRAVENOUS | Status: DC
  Administered 2011-08-25: 08:00:00 via INTRAVENOUS

## 2011-08-25 MED ORDER — ENALAPRILAT 1.25 MG/ML IV SOLN
1.2500 mg | Freq: Once | INTRAVENOUS | Status: AC
  Administered 2011-08-25: 1.25 mg via INTRAVENOUS

## 2011-08-25 MED ORDER — SODIUM CHLORIDE 0.9 % IV SOLN: INTRAVENOUS | Status: DC

## 2011-08-25 MED ORDER — SODIUM CHLORIDE 0.9 % IJ SOLN: 3.0000 mL | INTRAMUSCULAR | Status: DC | PRN

## 2011-08-25 MED ORDER — CLONIDINE HCL 0.2 MG PO TABS
0.2000 mg | ORAL_TABLET | ORAL | Status: AC
  Administered 2011-08-25: 0.2 mg via ORAL
  Filled 2011-08-25: qty 1

## 2011-08-25 MED ORDER — ASPIRIN 81 MG PO CHEW
324.0000 mg | CHEWABLE_TABLET | ORAL | Status: AC
  Administered 2011-08-25: 324 mg via ORAL
  Filled 2011-08-25: qty 4

## 2011-08-25 MED ORDER — LABETALOL HCL 5 MG/ML IV SOLN
INTRAVENOUS | Status: AC
  Filled 2011-08-25: qty 4

## 2011-08-25 MED ORDER — HEPARIN (PORCINE) IN NACL 2-0.9 UNIT/ML-% IJ SOLN
INTRAMUSCULAR | Status: AC
  Filled 2011-08-25: qty 1000

## 2011-08-25 MED ORDER — CLONIDINE HCL 0.2 MG PO TABS: 0.2000 mg | ORAL_TABLET | Freq: Every day | ORAL | Status: AC

## 2011-08-25 MED ORDER — ONDANSETRON HCL 4 MG/2ML IJ SOLN: 4.0000 mg | Freq: Four times a day (QID) | INTRAMUSCULAR | Status: DC | PRN

## 2011-08-25 MED ORDER — SODIUM CHLORIDE 0.9 % IV SOLN: 250.0000 mL | INTRAVENOUS | Status: DC | PRN

## 2011-08-25 MED ORDER — SODIUM CHLORIDE 0.9 % IJ SOLN: 3.0000 mL | Freq: Two times a day (BID) | INTRAMUSCULAR | Status: DC

## 2011-08-25 NOTE — Progress Notes (Signed)
Called to Short Stay to start 2 ivs for pt having cardiac cath this am; able to start 1 iv in left forearm;  Attempted 2 more times to start a 2nd site; unable to thread catheters completely into veins;  RN aware;

## 2011-08-25 NOTE — Interval H&P Note (Signed)
History and Physical Interval Note:  08/25/2011 9:29 AM  Paul [name]  has presented today for surgery, with the diagnosis of chest pain  The various methods of treatment have been discussed with the patient and family. After consideration of risks, benefits and other options for treatment, the patient has consented to  Procedure(s): LEFT HEART CATHETERIZATION WITH CORONARY ANGIOGRAM as a surgical intervention .  The patients' history has been reviewed, patient examined, no change in status, stable for surgery.  I have reviewed the patients' chart and labs.  Questions were answered to the patient's satisfaction.     Rollene Rotunda

## 2011-08-25 NOTE — H&P (View-Only) (Signed)
The patient was discussed with Tereso Newcomer PA.  The patient is a poor historian but his symptoms are concerning for recurrent angina.  I feel that the best way to delineate his present problem and guide future therapy will be to proceed with cardiac catheterization.  This will be done in the near future.  Cassell Clement

## 2011-08-25 NOTE — Procedures (Signed)
  Cardiac Catheterization Procedure Note  Name: Paul [name] MRN: [id number] DOB: January 22, 1971  Procedure: Left Heart Cath, Selective Coronary Angiography, LV angiography, Distal Aortagram   Indication:  CAD, previous stenting of the LAD and circ 2007, difficult to control hypertension  Procedural details: The right groin was prepped, draped, and anesthetized with 1% lidocaine. Using modified Seldinger technique, a 5 French sheath was introduced into the right femoral artery. Standard Judkins catheters were used for coronary angiography and left ventriculography. Catheter exchanges were performed over a guidewire. There were no immediate procedural complications. The patient was transferred to the post catheterization recovery area for further monitoring.  Of note his blood pressure was treated aggressively throughout the case.    Procedural Findings:  Hemodynamics:     AO 204/118    LV 200/31   Coronary angiography:  Coronary dominance: Right  Left mainstem:   Normal  Left anterior descending (LAD):   Proximal stent patent with diffuse luminal irregularities. Diffuse mid and distal disease with moderately severe apical stenosis. Mid and distal disease is nonobstructive. First diagonal is small and branching. There is proximal long 70-80% stenosis. Second diagonal is small with ostial 80% stenosis.  Left circumflex (LCx):  Proximal AV groove luminal irregularities. Mid stent in the AV groove is widely patent with luminal irregularities. Large mid obtuse marginal with proximal 50% stenosis, mid 40% stenosis and distal nonobstructive disease. Large posterior lateral has ostial 30% stenosis and mid 25% stenosis.  Right coronary artery (RCA):  The right coronary artery proximal long 30% stenosis. There is catheter induced vasospasm. Ostial flush injection demonstrated no fixed ostial disease. Mid RCA long 25-30% stenosis. PDA is small with diffuse disease and proximal 75% stenosis.  Left  ventriculography: Left ventricular systolic function is normal, LVEF is estimated at 45% with mild global hypokinesis, there is ectopy-induced mitral regurgitation.  Aortogram:  Distal aortogram demonstrated widely patent renals with no obstructive disease.  Conclusions:  Diffuse nonobstructive coronary disease with patent stents. He does have some small vessel disease which could be causing symptoms. His renal arteries were widely patent. His EF, as before, is mildly reduced. This is consistent with a mixed ischemic and nonischemic cardiomyopathy secondary to hypertension.  Recommendations: He will need aggressive medical management. He will need compliance with medications.  Fayrene Fearing Selda Jalbert 08/25/2011, 10:14 AM

## 2011-08-26 ENCOUNTER — Telehealth: Payer: Self-pay | Admitting: *Deleted

## 2011-08-26 LAB — GLUCOSE, CAPILLARY: Glucose-Capillary: 68 mg/dL — ABNORMAL LOW (ref 70–99)

## 2011-08-26 NOTE — Telephone Encounter (Signed)
Discussed with Larita Fife. Told her at this time I would not do the additional Accupril but we will do the new medications of the hydralazine as well as the Imdur that he was given. Maurice March will give a call to [facility] health Department

## 2011-08-26 NOTE — Telephone Encounter (Signed)
[facility] pharmacy notified. Of message from Dr. Katrinka Blazing.

## 2011-08-26 NOTE — Telephone Encounter (Signed)
Call received from [facility] stating patient is there to pick up the accuretic RX sent in Dec 12,2012. Patient thinks he is also suppose to be on a separate Accupril tab   in addition to this,  so his accupril dose totals 30 mg.    He had a cardiac cath yesterday and Dr. Alben Spittle gave him RX on 01/14 for hydrolozine 25 mg one three times daily and  Imdur 30 mg one daily. Will forward message to Dr. Katrinka Blazing

## 2011-09-02 ENCOUNTER — Encounter: Payer: Self-pay | Admitting: Family Medicine

## 2011-09-03 ENCOUNTER — Ambulatory Visit (INDEPENDENT_AMBULATORY_CARE_PROVIDER_SITE_OTHER): Payer: Self-pay | Admitting: Family Medicine

## 2011-09-03 VITALS — BP 128/84 | HR 74 | Temp 98.6°F | Wt 169.0 lb

## 2011-09-03 DIAGNOSIS — E785 Hyperlipidemia, unspecified: Secondary | ICD-10-CM

## 2011-09-03 DIAGNOSIS — F411 Generalized anxiety disorder: Secondary | ICD-10-CM

## 2011-09-03 DIAGNOSIS — L2089 Other atopic dermatitis: Secondary | ICD-10-CM

## 2011-09-03 DIAGNOSIS — I10 Essential (primary) hypertension: Secondary | ICD-10-CM

## 2011-09-03 DIAGNOSIS — E119 Type 2 diabetes mellitus without complications: Secondary | ICD-10-CM

## 2011-09-03 LAB — POCT GLYCOSYLATED HEMOGLOBIN (HGB A1C): Hemoglobin A1C: 6.3

## 2011-09-03 MED ORDER — HYDROXYZINE HCL 25 MG PO TABS: 25.0000 mg | ORAL_TABLET | Freq: Three times a day (TID) | ORAL | Status: DC | PRN

## 2011-09-03 MED ORDER — TRIAMCINOLONE ACETONIDE 0.1 % EX CREA: TOPICAL_CREAM | Freq: Two times a day (BID) | CUTANEOUS | Status: DC

## 2011-09-03 NOTE — Patient Instructions (Signed)
I think you are doing good. I am giving you a medicine for your anxiety, you can take 1 pill 3 times a day as needed.  Continue your diabetes medication.  I want to see you again in 3 months.

## 2011-09-04 ENCOUNTER — Encounter: Payer: Self-pay | Admitting: Family Medicine

## 2011-09-04 LAB — LDL CHOLESTEROL, DIRECT: Direct LDL: 84 mg/dL

## 2011-09-04 NOTE — Assessment & Plan Note (Signed)
Patient is doing very well with his Levemir last A1c of 6.3 which is fantastic. Encourage patient to continue if any low blood 3 months would consider decreasing his limited to 9 units at night patient will keep me updated.

## 2011-09-04 NOTE — Assessment & Plan Note (Signed)
When asked patient still being very anxious will attempt to use hydroxyzine. Patient is known to try benzodiazepine do to the potential of becoming addicted. This is very respected.

## 2011-09-04 NOTE — Assessment & Plan Note (Signed)
Well-controlled at this time patient was added on with Imdur seems to be doing well. No changes needed followup in 3 months.

## 2011-09-04 NOTE — Progress Notes (Signed)
  Subjective:    Patient ID: Paul [name], male    DOB: Nov 13, 1970, 41 y.o.   MRN: [id number]  HPI 1. Hypertension Blood pressure at home:not checking Blood pressure today: 128/84 Taking Meds:yes Side effects:no ROS: Denies headache visual changes nausea, vomiting, chest pain or abdominal pain or shortness of breath.   Diabetes:  High at home:not checking Low at home:not checking Taking medications: on levimir and metformin Side effects:no ROS: denies fever, chills, dizziness, loss of conscieness, polyuria poly dipsia numbness or tingling in extremities or chest pain. Lab Results  Component Value Date   HGBA1C 6.3 09/03/2011    Lipids-  Supposed to be on lipitor and is taking it.  Attempted to try to change pravastatin but patient was unable to tolerate it. Patient denies any type of muscle pain has been taking the medicine in the morning because he forgets about at night. Lab Results  Component Value Date   CHOL 106 07/10/2010   HDL 24* 07/10/2010   LDLCALC 53 07/10/2010   LDLDIRECT 84 09/03/2011   TRIG 146 07/10/2010   CHOLHDL 4.4 Ratio 07/10/2010   CAD- patient has had 4 stents 2 in 2007 and 2 in 2011. Patient recently had a catheterization done in January 2013 does have some stenosis with signs of ischemic and nonischemic cardiomyopathy with an ejection fraction of 45% but no restenting necessary and being medically managed.  Review of Systems  Denies fever, chills, nausea vomiting abdominal pain, dysuria, chest pain, shortness of breath dyspnea on exertion or numbness in extremities Past medical history, social, surgical and family history all reviewed.      Objective:   Physical Exam  Gen: NAD, alert, cooperative with exam HEENT: MMM, EOMI, PERRL CV: RRR, no MRG Resp: CTABL, no wheezes noted Abd: SNTND, BS present, no guarding or organomegaly Ext: No edema noted, full ROM    Assessment & Plan:

## 2011-09-04 NOTE — Assessment & Plan Note (Signed)
Patient attempted to go to pravastatin sorted be cheaper medication but unable to tolerate it. Patient on Lipitor we'll recheck direct LDL but has never been of concern. Patient's HDL has always been low discussed the potential for adding fish oil as well as niacin but seemed to overwhelm patient. Patient would like to not make changes if possible.

## 2011-09-17 ENCOUNTER — Telehealth: Payer: Self-pay | Admitting: Physician Assistant

## 2011-09-17 NOTE — Telephone Encounter (Signed)
ROI singed, pt Picked up copy of Stress 09/17/11/KM

## 2011-10-14 ENCOUNTER — Ambulatory Visit (INDEPENDENT_AMBULATORY_CARE_PROVIDER_SITE_OTHER): Payer: Self-pay | Admitting: Family Medicine

## 2011-10-14 ENCOUNTER — Ambulatory Visit: Payer: Self-pay | Admitting: Family Medicine

## 2011-10-14 ENCOUNTER — Encounter: Payer: Self-pay | Admitting: Family Medicine

## 2011-10-14 DIAGNOSIS — F411 Generalized anxiety disorder: Secondary | ICD-10-CM

## 2011-10-14 DIAGNOSIS — I10 Essential (primary) hypertension: Secondary | ICD-10-CM

## 2011-10-14 DIAGNOSIS — E119 Type 2 diabetes mellitus without complications: Secondary | ICD-10-CM

## 2011-10-14 MED ORDER — ISOSORBIDE MONONITRATE ER 30 MG PO TB24: 30.0000 mg | ORAL_TABLET | Freq: Every day | ORAL | Status: DC

## 2011-10-14 NOTE — Progress Notes (Signed)
  Subjective:    Patient ID: Paul [name], male    DOB: [birth year], 41 y.o.   MRN: [id number]  Diabetes  Hypertension  1. Hypertension Blood pressure at home:not checking Blood pressure today: 142/100 Taking Meds: No patient ran out of medications and not have them filled. Side effects:no ROS: Denies headache visual changes nausea, vomiting, chest pain or abdominal pain or shortness of breath.   Diabetes:  High at home:not checking Low at home:not checking Taking medications: on levimir and metformin Side effects:no ROS: denies fever, chills, dizziness, loss of conscieness, polyuria poly dipsia numbness or tingling in extremities or chest pain. Lab Results  Component Value Date   HGBA1C 6.3 09/03/2011    Lipids-  Supposed to be on lipitor and is taking it.  Attempted to try to change pravastatin but patient was unable to tolerate it. Patient denies any type of muscle pain has been taking the medicine in the morning because he forgets about at night. Lab Results  Component Value Date   CHOL 106 07/10/2010   HDL 24* 07/10/2010   LDLCALC 53 07/10/2010   LDLDIRECT 84 09/03/2011   TRIG 146 07/10/2010   CHOLHDL 4.4 Ratio 07/10/2010   CAD- patient has had 4 stents 2 in 2007 and 2 in 2011. Patient recently had a catheterization done in January 2013 does have some stenosis with signs of ischemic and nonischemic cardiomyopathy with an ejection fraction of 45% but no restenting necessary and being medically managed.   patient  states that he does have significant amount of anxiety during the course of her regular day. Patient states that this anxiety makes him unable to work full-time. Patient though declines wanting anything for this anxiety other than that hydroxyzine that he has because it is now a take any more medications. Patient denies any suicidal or homicidal ideation denies any feelings of depression denies any insomnia. Preventative care- patient has not seen an ophthalmologist or  a dentist. Would like referral with patient having no insurance. Patient is covered under [facility].  Review of Systems  Denies fever, chills, nausea vomiting abdominal pain, dysuria, chest pain, shortness of breath dyspnea on exertion or numbness in extremities Past medical history, social, surgical and family history all reviewed.    Objective:   Physical Exam  vitals reviewed Gen: NAD, alert, cooperative with exam HEENT: MMM, EOMI, PERRL CV: RRR, no MRG Resp: CTABL, no wheezes noted Abd: SNTND, BS present, no guarding or organomegaly Ext: No edema noted, full ROM   exam to diabetic foot done and documented in Epic Assessment & Plan:

## 2011-10-14 NOTE — Assessment & Plan Note (Signed)
At goal we'll continue current therapy Will recheck A1c in 6 weeks' time to

## 2011-10-14 NOTE — Assessment & Plan Note (Signed)
Discussed again at length with patient. Told him I would really think of treatment as a generalized anxiety disorder with daily medication. Patient declined at this time I feel that this would likely improve a lot of patient's quality of life.

## 2011-10-14 NOTE — Assessment & Plan Note (Signed)
Elevated today but has not taken his Corag patient will return for recheck with nurse in the next 2 weeks with him restarting his Coreg.

## 2011-10-14 NOTE — Patient Instructions (Signed)
We will consider treating your anxiety when you think you can afford it. I can use medicines on the 4 dollar plans.  If still tired we will check your thyroid. I want you to come back in 6 weeks. We will check your A1c and other labs.

## 2011-11-05 ENCOUNTER — Other Ambulatory Visit: Payer: Self-pay | Admitting: Family Medicine

## 2011-11-05 DIAGNOSIS — F411 Generalized anxiety disorder: Secondary | ICD-10-CM

## 2011-11-05 MED ORDER — HYDROXYZINE HCL 25 MG PO TABS: 25.0000 mg | ORAL_TABLET | Freq: Three times a day (TID) | ORAL | Status: AC | PRN

## 2011-11-18 ENCOUNTER — Other Ambulatory Visit: Payer: Self-pay | Admitting: Family Medicine

## 2012-01-07 ENCOUNTER — Ambulatory Visit (INDEPENDENT_AMBULATORY_CARE_PROVIDER_SITE_OTHER): Payer: Self-pay | Admitting: Family Medicine

## 2012-01-07 ENCOUNTER — Encounter: Payer: Self-pay | Admitting: Family Medicine

## 2012-01-07 VITALS — BP 148/86 | HR 78

## 2012-01-07 DIAGNOSIS — F411 Generalized anxiety disorder: Secondary | ICD-10-CM

## 2012-01-07 DIAGNOSIS — E119 Type 2 diabetes mellitus without complications: Secondary | ICD-10-CM

## 2012-01-07 DIAGNOSIS — I10 Essential (primary) hypertension: Secondary | ICD-10-CM

## 2012-01-07 LAB — POCT GLYCOSYLATED HEMOGLOBIN (HGB A1C): Hemoglobin A1C: 6.9

## 2012-01-07 MED ORDER — CITALOPRAM HYDROBROMIDE 20 MG PO TABS: ORAL_TABLET | ORAL | Status: DC

## 2012-01-07 MED ORDER — BUSPIRONE HCL 7.5 MG PO TABS: 7.5000 mg | ORAL_TABLET | Freq: Two times a day (BID) | ORAL | Status: DC

## 2012-01-07 NOTE — Assessment & Plan Note (Signed)
Patient still having a difficult time at this time. We'll start Celexa given potential side effects. Also will start BuSpar with patient having anxiety most of the day. Patient will be following up in the next 2 weeks. Action plan for any suicidal or homicidal ideation reviewed.

## 2012-01-07 NOTE — Assessment & Plan Note (Signed)
Still mildly elevated continue to monitor. Patient though does have some orthostatic hypotension when trying to get too much control.

## 2012-01-07 NOTE — Progress Notes (Signed)
Patient ID: Paul [name], male   DOB: [birth year], 41 y.o.   MRN: [id number]  Subjective:    Patient ID: Paul [name], male    DOB: 1970-09-11, 41 y.o.   MRN: [id number]  Diabetes  Hypertension  1. Hypertension Blood pressure at home:not checking Blood pressure today: 142/88 Taking Meds: Yes Side effects:no ROS: Denies headache visual changes nausea, vomiting, chest pain or abdominal pain or shortness of breath.   Diabetes:  High at home:not checking Low at home:not checking Taking medications: on levimir and metformin Side effects:no ROS: denies fever, chills, dizziness, loss of conscieness, polyuria poly dipsia numbness or tingling in extremities or chest pain. Lab Results  Component Value Date   HGBA1C 6.9 01/07/2012   A1c went up from 6.3 previously  CAD- patient has had 4 stents 2 in 2007 and 2 in 2011. Patient recently had a catheterization done in January 2013 does have some stenosis with signs of ischemic and nonischemic cardiomyopathy with an ejection fraction of 45% but no restenting necessary and being medically managed. Patient states that he is having some palpitations from time to time and is scheduled to see his cardiologist in the near future. Patient denies any significant chest pain and nose red flags and when to seek medical attention.   Anxiety-patient  states that he does have significant amount of anxiety during the course of his regular day. Patient states that this anxiety makes him unable to work full-time.patient seemed to not one medication before but at this time the anxiety is seeming to be overwhelming. Patient also is feeling some signs of depression as well.  Patient is having some insomnia and now going to be around people as much.  Patient denies any suicidal or homicidal ideation.  Review of Systems Denies fever, chills, nausea vomiting abdominal pain, dysuria, chest pain, shortness of breath dyspnea on exertion or numbness in  extremities Past medical history, social, surgical and family history all reviewed.    Objective:   Physical Exam vitals reviewed Gen: NAD, alert, cooperative with exam HEENT: MMM, EOMI, PERRL CV: RRR, no MRG Resp: CTABL, no wheezes noted Abd: SNTND, BS present, no guarding or organomegaly Ext: No edema noted, full ROM Skin: Patient does have some vitiligo on lips and other areas of the body been stable for long amount of time.    Assessment & Plan:

## 2012-01-07 NOTE — Assessment & Plan Note (Signed)
Mild worsening but overall still doing very well. We'll continue current therapy encourage patient to watch his diet a little more closely.

## 2012-01-07 NOTE — Patient Instructions (Signed)
Your A1c was 6.9 which is still great. I'm giving you to medications for your anxiety. One is called BuSpar take one pill twice daily. The other one is called Celexa. Take one pill daily for the first week then 2 pills daily thereafter. I want you to make an appointment with your cardiologist. I will see you again in 3 weeks.

## 2012-01-17 ENCOUNTER — Encounter: Payer: Self-pay | Admitting: Family Medicine

## 2012-01-29 ENCOUNTER — Ambulatory Visit (INDEPENDENT_AMBULATORY_CARE_PROVIDER_SITE_OTHER): Payer: Self-pay | Admitting: Family Medicine

## 2012-01-29 ENCOUNTER — Encounter: Payer: Self-pay | Admitting: Family Medicine

## 2012-01-29 VITALS — BP 168/102 | HR 79 | Temp 98.5°F | Ht 71.0 in | Wt 172.0 lb

## 2012-01-29 DIAGNOSIS — F411 Generalized anxiety disorder: Secondary | ICD-10-CM

## 2012-01-29 DIAGNOSIS — E119 Type 2 diabetes mellitus without complications: Secondary | ICD-10-CM

## 2012-01-29 DIAGNOSIS — I10 Essential (primary) hypertension: Secondary | ICD-10-CM

## 2012-01-29 DIAGNOSIS — I2510 Atherosclerotic heart disease of native coronary artery without angina pectoris: Secondary | ICD-10-CM

## 2012-01-29 NOTE — Patient Instructions (Signed)
It is great to see you. I am giving you some lisinopril past until you can fill your medical medication again. Take 4 pills daily. I also want you to pick up her Celexa. I think this medicine will help with her anxiety but will take 2-3 weeks to notice it. You should come back in one to 2 months and followup with a new primary care provider. It has been a pleasure getting to know you over the course of the 3 years.

## 2012-01-29 NOTE — Progress Notes (Signed)
Patient ID: Paul [name], male   DOB: May 19, 1971, 41 y.o.   MRN: [id number] Patient ID: Paul [name], male   DOB: 04-Feb-1971, 41 y.o.   MRN: [id number]  Subjective:    Patient ID: Paul [name], male    DOB: [birth year], 41 y.o.   MRN: [id number]  Anxiety    Diabetes  Hypertension Associated symptoms include anxiety.  1. Hypertension Blood pressure at home:not checking Blood pressure today: 168/102 Taking Meds: no had run out of medications and has not taken any today.  Side effects:no ROS: Denies headache visual changes nausea, vomiting, chest pain or abdominal pain or shortness of breath.   Diabetes:  High at home:not checking Low at home:not checking Taking medications: on levimir and metformin Side effects:no ROS: denies fever, chills, dizziness, loss of conscieness, polyuria poly dipsia numbness or tingling in extremities or chest pain. Lab Results  Component Value Date   HGBA1C 6.9 01/07/2012   A1c went up from 6.3 previously    Anxiety-patient  was supposed to be taking BuSpar and Celexa the patient denied this medication still due to pricing. Patient does have money now and is going to pick up the Celexa once he leaves our office. Patient states his anxiety is a little better because he has been taking a natural over-the-counter medication but he cannot remember the name. Patient states that it helps him with that severe anxiety and does not make him sleepy. Patient is still not been able to work but is very interested in working in the near future.  Review of Systems Denies fever, chills, nausea vomiting abdominal pain, dysuria, chest pain, shortness of breath dyspnea on exertion or numbness in extremities Past medical history, social, surgical and family history all reviewed.    Objective:   Physical Exam  vitals reviewed Gen: NAD, alert, cooperative with exam HEENT: MMM, EOMI, PERRL CV: RRR, no MRG Resp: CTABL, no wheezes noted Abd: SNTND, BS present,  no guarding or organomegaly Ext: No edema noted, full ROM Skin: Patient does have some vitiligo on lips and other areas of the body been stable for long amount of time.    Assessment & Plan:

## 2012-01-29 NOTE — Assessment & Plan Note (Signed)
Encourage patient to start Celexa and BuSpar followup in one month with primary care provider. Warned of potential side effects and when to seek medical attention.

## 2012-01-29 NOTE — Assessment & Plan Note (Signed)
Patient has been working hard and seems to be doing very well, encouraged to continue the good work.

## 2012-01-29 NOTE — Assessment & Plan Note (Signed)
Elevated today continue to monitor closely. Gave him some lisinopril to have on hand until he can refill his medicines. Told him how high-risk used to for recurrent heart attack as well as stroke.

## 2012-02-18 ENCOUNTER — Other Ambulatory Visit: Payer: Self-pay | Admitting: *Deleted

## 2012-02-18 MED ORDER — CLOPIDOGREL BISULFATE 75 MG PO TABS: 75.0000 mg | ORAL_TABLET | Freq: Every day | ORAL | Status: DC

## 2012-02-18 NOTE — Telephone Encounter (Signed)
Called and left a message with family members that pt need to call clinic to discuss refill. Plavix is not indicated beyond 1 yr of stent placement. Pt had stents placed in 2011 according to epic.  Unsure whether there has been discussion w/ cardiologist or PCP about specifically continuing this medicaiton beyond 76yr Will refill for 1 month but pt needs to come in and see me or cardiologist beyond that point before further refills. Will fwd to staff to attempt further communication.

## 2012-02-22 ENCOUNTER — Telehealth: Payer: Self-pay | Admitting: Family Medicine

## 2012-02-22 DIAGNOSIS — I10 Essential (primary) hypertension: Secondary | ICD-10-CM

## 2012-02-22 DIAGNOSIS — I255 Ischemic cardiomyopathy: Secondary | ICD-10-CM

## 2012-02-22 DIAGNOSIS — E785 Hyperlipidemia, unspecified: Secondary | ICD-10-CM

## 2012-02-22 NOTE — Telephone Encounter (Signed)
[facility] pharmacy requesting written rxs faxed for Imdur 30 mg, lipitor 40 mg and accuretic 20/25 mg.  Although patient have refills for the Accuretic and the Imdur they still need [place] to fax them rxs.

## 2012-02-22 NOTE — Telephone Encounter (Signed)
Will forward message to Dr. Konrad Dolores. Also needs Levimere and  Nitrostat.   [facility] pharmacy states because he hasn't been there in  6 months they need new RX. Fax to [phone]

## 2012-02-24 MED ORDER — ISOSORBIDE MONONITRATE ER 30 MG PO TB24: 30.0000 mg | ORAL_TABLET | Freq: Every day | ORAL | Status: DC

## 2012-02-24 MED ORDER — ATORVASTATIN CALCIUM 40 MG PO TABS: 40.0000 mg | ORAL_TABLET | Freq: Every day | ORAL | Status: DC

## 2012-02-24 MED ORDER — NITROGLYCERIN 0.4 MG SL SUBL: 0.4000 mg | SUBLINGUAL_TABLET | SUBLINGUAL | Status: DC | PRN

## 2012-02-24 MED ORDER — QUINAPRIL-HYDROCHLOROTHIAZIDE 20-25 MG PO TABS: 1.0000 | ORAL_TABLET | Freq: Every day | ORAL | Status: DC

## 2012-02-24 NOTE — Telephone Encounter (Signed)
Refilled medications as requested. Called and left a voicemail for pt. Informing of refills.

## 2012-03-11 ENCOUNTER — Other Ambulatory Visit: Payer: Self-pay | Admitting: Family Medicine

## 2012-03-14 ENCOUNTER — Other Ambulatory Visit: Payer: Self-pay | Admitting: Family Medicine

## 2012-03-23 ENCOUNTER — Other Ambulatory Visit: Payer: Self-pay | Admitting: Family Medicine

## 2012-03-23 DIAGNOSIS — E119 Type 2 diabetes mellitus without complications: Secondary | ICD-10-CM

## 2012-03-23 MED ORDER — INSULIN DETEMIR 100 UNIT/ML ~~LOC~~ SOLN: 11.0000 [IU] | Freq: Every day | SUBCUTANEOUS | Status: DC

## 2012-03-23 NOTE — Assessment & Plan Note (Signed)
Received request from pharmacy for refill on Levemir. Will do so

## 2012-04-12 ENCOUNTER — Ambulatory Visit (INDEPENDENT_AMBULATORY_CARE_PROVIDER_SITE_OTHER): Payer: Self-pay | Admitting: Cardiology

## 2012-04-12 ENCOUNTER — Encounter: Payer: Self-pay | Admitting: *Deleted

## 2012-04-12 ENCOUNTER — Encounter: Payer: Self-pay | Admitting: Cardiology

## 2012-04-12 VITALS — BP 132/91 | HR 88 | Ht 71.0 in | Wt 159.8 lb

## 2012-04-12 DIAGNOSIS — I2589 Other forms of chronic ischemic heart disease: Secondary | ICD-10-CM

## 2012-04-12 DIAGNOSIS — E785 Hyperlipidemia, unspecified: Secondary | ICD-10-CM

## 2012-04-12 DIAGNOSIS — I255 Ischemic cardiomyopathy: Secondary | ICD-10-CM

## 2012-04-12 DIAGNOSIS — I2510 Atherosclerotic heart disease of native coronary artery without angina pectoris: Secondary | ICD-10-CM

## 2012-04-12 MED ORDER — CLOPIDOGREL BISULFATE 75 MG PO TABS: 75.0000 mg | ORAL_TABLET | Freq: Every day | ORAL | Status: DC

## 2012-04-12 NOTE — Progress Notes (Signed)
HPI The patient presents for evaluation of CAD.  I last saw him in January and it has been a while since his evaluation. He ultimately had cardiac catheterization with results below. He had patent stents with residual nonobstructive disease. His EF is slightly reduced.  He was managed with the addition of Imdur. He did run out of Plavix since I last saw him. He thinks the Imdur helped. He's not having any chest pressure or neck discomfort. In particular he is having less breathlessness and he was having previously. He works part time. He  "paces himself". He's not describing any PND or orthopnea. He's had no palpitations, presyncope or syncope.  Allergies  Allergen Reactions  . Shrimp (Shellfish Allergy) Swelling    Current Outpatient Prescriptions  Medication Sig Dispense Refill  . aspirin 81 MG EC tablet Take 81 mg by mouth daily.        Marland Kitchen atorvastatin (LIPITOR) 40 MG tablet Take 1 tablet (40 mg total) by mouth daily.  90 tablet  2  . busPIRone (BUSPAR) 7.5 MG tablet Take 1 tablet (7.5 mg total) by mouth 2 (two) times daily.  60 tablet  1  . carvedilol (COREG) 25 MG tablet TAKE ONE TABLET BY MOUTH TWICE DAILY  60 tablet  1  . citalopram (CELEXA) 20 MG tablet Take one pill daily for the first week and 2 pills daily thereafter.  60 tablet  1  . glucose blood (BAYER CONTOUR TEST) test strip Use as directed       . hydrALAZINE (APRESOLINE) 25 MG tablet Take 1 tablet (25 mg total) by mouth 3 (three) times daily.  90 tablet  11  . insulin glargine (LANTUS) 100 UNIT/ML injection Inject 11 Units into the skin at bedtime.      . Insulin Pen Needle 32G X 6 MM MISC To be used with the Levemir flex pen  100 each  6  . isosorbide mononitrate (IMDUR) 30 MG 24 hr tablet Take 1 tablet (30 mg total) by mouth daily.  90 tablet  2  . metFORMIN (GLUCOPHAGE) 500 MG tablet Take 500-1,000 mg by mouth 2 (two) times daily. Take 2 tablets in the morning Take 1 tablet at 2pm      . Multiple Vitamin (MULTIVITAMIN)  tablet Take 1 tablet by mouth daily.        . nitroGLYCERIN (NITROSTAT) 0.4 MG SL tablet Place 1 tablet (0.4 mg total) under the tongue every 5 (five) minutes as needed.  25 tablet  6  . quinapril-hydrochlorothiazide (ACCURETIC) 20-25 MG per tablet Take 1 tablet by mouth daily.  90 tablet  3  . triamcinolone cream (KENALOG) 0.1 % Apply topically 2 (two) times daily.  2268 g  1  . clopidogrel (PLAVIX) 75 MG tablet Take 1 tablet (75 mg total) by mouth daily.  30 tablet  0    Past Medical History  Diagnosis Date  . Hypertension   . CAD (coronary artery disease)     Anterior MI 2007 with DES stenting of an occluded LAD, distal 70% stenosis, OM 60% stenosis, circumflex 90% stenosis treated with a drug-eluting stent, PA 90% stenosis treated medically.  Can't January 2013 70-80% diagonal stenosis in a small vessel, OM 50% stenosis, PDA long 75% stenosis. EF 45%. He was managed medically.  . Diabetes mellitus   . Hyperlipidemia   . Cardiomyopathy, ischemic     EF 45%    Past Surgical History  Procedure Date  . Catherization 08/25/11    Dr.  Glendy Barsanti    ROS:  As stated in the HPI and negative for all other systems.  PHYSICAL EXAM BP 132/91  Pulse 88  Ht 5\' 11"  (1.803 m)  Wt 159 lb 12.8 oz (72.485 kg)  BMI 22.29 kg/m2 GENERAL:  Well appearing HEENT:  Pupils equal round and reactive, fundi not visualized, oral mucosa unremarkable NECK:  No jugular venous distention, waveform within normal limits, carotid upstroke brisk and symmetric, no bruits, no thyromegaly LYMPHATICS:  No cervical, inguinal adenopathy LUNGS:  Clear to auscultation bilaterally BACK:  No CVA tenderness CHEST:  Unremarkable HEART:  PMI not displaced or sustained,S1 and S2 within normal limits, no S3, no S4, no clicks, no rubs, no murmurs ABD:  Flat, positive bowel sounds normal in frequency in pitch, no bruits, no rebound, no guarding, no midline pulsatile mass, no hepatomegaly, no splenomegaly EXT:  2 plus pulses  throughout, no edema, no cyanosis no clubbing SKIN:  No rashes no nodules NEURO:  Cranial nerves II through XII grossly intact, motor grossly intact throughout PSYCH:  Cognitively intact, oriented to person place and time  EKG:  Sinus rhythm, rate 82, axis within normal limits, intervals within normal limits, anterior T-wave inversions unchanged from previous. 04/12/2012  ASSESSMENT AND PLAN  CAD (coronary artery disease) -  A he has had no new symptoms. He is tolerating medications. He ran out of Plavix and I would like him to remain on this. We will continue with risk reduction.   Cardiomyopathy, ischemic -  The patient seems to be euvolemic. He's on a good medical regimen. No change in therapy is indicated.   Hypertension -  The blood pressure is at target. No change in medications is indicated. We will continue with therapeutic lifestyle changes (TLC).  Hyperlipidemia -  His LDL is at target.  I will defer to his primary providers.

## 2012-04-12 NOTE — Patient Instructions (Addendum)
The current medical regimen is effective;  continue present plan and medications.  Follow up in 1 year with Dr Hochrein.  You will receive a letter in the mail 2 months before you are due.  Please call us when you receive this letter to schedule your follow up appointment.  

## 2012-04-14 ENCOUNTER — Ambulatory Visit (INDEPENDENT_AMBULATORY_CARE_PROVIDER_SITE_OTHER): Payer: Self-pay | Admitting: Family Medicine

## 2012-04-14 ENCOUNTER — Encounter: Payer: Self-pay | Admitting: Family Medicine

## 2012-04-14 VITALS — BP 126/74 | HR 75 | Wt 160.9 lb

## 2012-04-14 DIAGNOSIS — M79643 Pain in unspecified hand: Secondary | ICD-10-CM

## 2012-04-14 DIAGNOSIS — I10 Essential (primary) hypertension: Secondary | ICD-10-CM

## 2012-04-14 DIAGNOSIS — M79609 Pain in unspecified limb: Secondary | ICD-10-CM

## 2012-04-14 DIAGNOSIS — E119 Type 2 diabetes mellitus without complications: Secondary | ICD-10-CM

## 2012-04-14 DIAGNOSIS — E785 Hyperlipidemia, unspecified: Secondary | ICD-10-CM

## 2012-04-14 LAB — POCT GLYCOSYLATED HEMOGLOBIN (HGB A1C): Hemoglobin A1C: 7

## 2012-04-14 NOTE — Patient Instructions (Addendum)
Thank you for coming in today. Please follow up with me as needed.  Please apply a little vasaline or other lotion to your hand every night to keep the skin soft Try and keep your hand warm to promote healing Follow up with me as needed.

## 2012-04-19 NOTE — Assessment & Plan Note (Addendum)
A1c 7.6. Continue q/ current regimen. Well controlled

## 2012-05-03 ENCOUNTER — Encounter: Payer: Self-pay | Admitting: Family Medicine

## 2012-05-03 DIAGNOSIS — M79643 Pain in unspecified hand: Secondary | ICD-10-CM | POA: Insufficient documentation

## 2012-05-03 DIAGNOSIS — M79641 Pain in right hand: Secondary | ICD-10-CM | POA: Insufficient documentation

## 2012-05-03 NOTE — Progress Notes (Signed)
  Subjective:    Patient ID: Paul [name], male    DOB: 03-01-1971, 41 y.o.   MRN: [id number]  HPI  CC: HAnd irritation  Hand irritation: Swelling and pain in hands for 3 mo. 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 Improves w/ rest/abscense from work. Denies skin breakdown, fever, rash.  DM: BS at home typically in the 110s. Taking 11 units lantus QHS. Likes to drink his sweetened beverages. Denies any hypo or hyperglycemic type symptoms  HTN: taking medications as prescribed. Denies HA, CP, SOB, syncope. Sees a cardilogist regularly  Optho exam 1 mo ago normal  Review of Systems Per hpi    Objective:   Physical Exam  Gen: NAD SKin: dry and cracking skin over hands. no erythema Musc :Hands normal appearing bilat. Minimal swelling of R>L . 2+ pulses throughout extremities. HAnds non-painful to palpation      Assessment & Plan:

## 2012-05-03 NOTE — Assessment & Plan Note (Addendum)
Likely from work exposure and overuse.  To limit use and exposure when possible NSAIDs for relief PRN Moisterizing lotions QHS

## 2012-05-03 NOTE — Assessment & Plan Note (Signed)
LDL at goal will recheck at 89yr

## 2012-05-03 NOTE — Assessment & Plan Note (Signed)
BP appropriate today. Taking all meds as prescribed. Continue current regimen

## 2012-05-17 ENCOUNTER — Ambulatory Visit: Payer: Self-pay | Admitting: Family Medicine

## 2012-05-26 ENCOUNTER — Encounter: Payer: Self-pay | Admitting: Family Medicine

## 2012-05-26 ENCOUNTER — Ambulatory Visit (INDEPENDENT_AMBULATORY_CARE_PROVIDER_SITE_OTHER): Payer: Self-pay | Admitting: Family Medicine

## 2012-05-26 VITALS — BP 120/88 | HR 68 | Temp 98.6°F | Ht 71.0 in | Wt 159.0 lb

## 2012-05-26 DIAGNOSIS — I10 Essential (primary) hypertension: Secondary | ICD-10-CM

## 2012-05-26 DIAGNOSIS — M79643 Pain in unspecified hand: Secondary | ICD-10-CM

## 2012-05-26 DIAGNOSIS — I2510 Atherosclerotic heart disease of native coronary artery without angina pectoris: Secondary | ICD-10-CM

## 2012-05-26 DIAGNOSIS — E119 Type 2 diabetes mellitus without complications: Secondary | ICD-10-CM

## 2012-05-26 DIAGNOSIS — E785 Hyperlipidemia, unspecified: Secondary | ICD-10-CM

## 2012-05-26 DIAGNOSIS — M79609 Pain in unspecified limb: Secondary | ICD-10-CM

## 2012-05-26 MED ORDER — INSULIN GLARGINE 100 UNIT/ML ~~LOC~~ SOLN: 11.0000 [IU] | Freq: Every day | SUBCUTANEOUS | Status: DC

## 2012-05-26 MED ORDER — METFORMIN HCL 500 MG PO TABS: 500.0000 mg | ORAL_TABLET | Freq: Two times a day (BID) | ORAL | Status: DC

## 2012-05-26 NOTE — Patient Instructions (Addendum)
Thank you for coming in today You are in great health Please continue to participate in the Accelerate Trial as instructed Please follow up with me as needed I will call in your Lantus as discussed. Continue to care for your hands as discussed.

## 2012-05-27 ENCOUNTER — Encounter: Payer: Self-pay | Admitting: Family Medicine

## 2012-05-27 NOTE — Assessment & Plan Note (Signed)
No further workup required at this time. Symptoms were caused by prolonged chemical exposure and prolonged moisture leading to skin breakdown and irritation

## 2012-05-27 NOTE — Assessment & Plan Note (Addendum)
Per pt. No further blood draws per research study protocol (ACCELERATE Trial - HDL raising ) Will contact Research team at [facility] to ensure proper pt mgt while on trial

## 2012-05-27 NOTE — Progress Notes (Signed)
  Subjective:    Patient ID: Paul [name], male    DOB: 03/20/1971, 41 y.o.   MRN: [id number]  HPI CC: questions about Cholesterol  Cholesterol: Pt started on accelerate Trial by [facility]. Contact person is Cherrie Distance at [phone]. Pt started on Evacetrapib (HDL elevating medication). Started 3-4wks ago. Denies any CP, SOB, HA. Last lipid panel reviewed w/ pt. Pt told to continue current therapies  CAD: significant h/o CAD w/ multiple prior surgeries. Lifelong anticoagulation. Denies CP, SOB, HA  DM: BS between 100-130 at home. COmpliant w/ Lantus regimen. Last A1c 7.0. Denies shaking/tremor, HA, syncope, urinary frequency, peripheral neuropathy  Hand pain: Resolved after decreasing chemical exposure and ensuring hands stay dry at work  Past medical and social histories reviewed and updated  Review of Systems {Per HPI    Objective:   Physical Exam  Gen: NAD Skin: warm well perfused. No skin breakdown, no pedal ulceration Musc: No effusions, normal ROM CV: RRR, no m/r/g   BP 120/88  Pulse 68  Temp 98.6 F (37 C) (Oral)  Ht 5\' 11"  (1.803 m)  Wt 159 lb (72.122 kg)  BMI 22.18 kg/m2       Assessment & Plan:

## 2012-05-27 NOTE — Assessment & Plan Note (Signed)
Closely followed by  Cards.  Continue w/ recommendations and Accelerate Trial Will contact Research team at [facility] to ensure proper pt mgt while on trial

## 2012-05-27 NOTE — Assessment & Plan Note (Signed)
Well controlled on Lantus and metformin Refill lantus and metformin today Diabetic foot exam normal

## 2012-05-27 NOTE — Assessment & Plan Note (Signed)
Well-controlled.  Continue current regimen. 

## 2012-05-30 MED ORDER — INSULIN GLARGINE 100 UNIT/ML ~~LOC~~ SOLN: 11.0000 [IU] | Freq: Every day | SUBCUTANEOUS | Status: DC

## 2012-05-30 NOTE — Addendum Note (Signed)
Addended by: Damita Lack on: 05/30/2012 01:52 PM   Modules accepted: Orders

## 2012-06-06 ENCOUNTER — Other Ambulatory Visit: Payer: Self-pay | Admitting: Family Medicine

## 2012-07-15 ENCOUNTER — Other Ambulatory Visit: Payer: Self-pay | Admitting: *Deleted

## 2012-07-19 MED ORDER — CARVEDILOL 25 MG PO TABS: 25.0000 mg | ORAL_TABLET | Freq: Two times a day (BID) | ORAL | Status: DC

## 2012-07-29 ENCOUNTER — Ambulatory Visit (INDEPENDENT_AMBULATORY_CARE_PROVIDER_SITE_OTHER): Payer: No Typology Code available for payment source | Admitting: Family Medicine

## 2012-07-29 ENCOUNTER — Encounter: Payer: Self-pay | Admitting: Family Medicine

## 2012-07-29 VITALS — BP 105/77 | HR 78 | Temp 99.4°F | Ht 71.0 in | Wt 159.0 lb

## 2012-07-29 DIAGNOSIS — R209 Unspecified disturbances of skin sensation: Secondary | ICD-10-CM

## 2012-07-29 DIAGNOSIS — I10 Essential (primary) hypertension: Secondary | ICD-10-CM

## 2012-07-29 DIAGNOSIS — E119 Type 2 diabetes mellitus without complications: Secondary | ICD-10-CM

## 2012-07-29 LAB — POCT GLYCOSYLATED HEMOGLOBIN (HGB A1C): Hemoglobin A1C: 5.9

## 2012-07-29 MED ORDER — CARVEDILOL 25 MG PO TABS: 25.0000 mg | ORAL_TABLET | Freq: Two times a day (BID) | ORAL | Status: DC

## 2012-07-29 NOTE — Assessment & Plan Note (Signed)
HTN well controlled. No change at this time Refill Carvedilol today

## 2012-07-29 NOTE — Assessment & Plan Note (Signed)
Normal physiologic changes due to cold weather vs Psychosomatic vs neuropathy Likely to self resolve

## 2012-07-29 NOTE — Progress Notes (Signed)
Paul [name] is a 41 y.o. male who presents to [facility] today for cold sensation on hand  R hand w/ cold sensation near base of thumb on dorsal surface. presnet for several days. No pain. No fever, rash, skin abraisions. Decreases w/ increased body and hand warmth. Worse w/ cold weather. Has never happened before. Denies smoking. Reproducible when placing cold watch on wrist.   DM: Compliant on home mediations  CAD: Followed by cardiology regularly. Lipids at goal. Enrolled in [employer] trial. Denies CP, SOB  HTN: Taknign Carvedilol and isosorbide mononitrate, and qhinapril-HCTZ.   The following portions of the patient's history were reviewed and updated as appropriate: allergies, current medications, past medical history, family and social history, and problem list.  Patient is a nonsmoker   Past Medical History  Diagnosis Date  . Hypertension   . CAD (coronary artery disease)     Anterior MI 2007 with DES stenting of an occluded LAD, distal 70% stenosis, OM 60% stenosis, circumflex 90% stenosis treated with a drug-eluting stent, PA 90% stenosis treated medically.  Can't January 2013 70-80% diagonal stenosis in a small vessel, OM 50% stenosis, PDA long 75% stenosis. EF 45%. He was managed medically.  . Diabetes mellitus   . Hyperlipidemia   . Cardiomyopathy, ischemic     EF 45%  . History of chest pain 11/25/2010    There are no concerning features in the patient's current presentation. Chest pain rule out performed in the emergency department was negative. The patient does have strong risk factors, considering the fact that he has known coronary artery disease. He is followed by a cardiologist on a routine basis, and his medical management is being optimized. There are no changes to his management that I fee  . Myocardial infarction   . Anxiety     ROS as above otherwise neg.    Medications reviewed. Current Outpatient Prescriptions  Medication Sig Dispense Refill  . aspirin 81 MG  EC tablet Take 81 mg by mouth daily.        Marland Kitchen atorvastatin (LIPITOR) 40 MG tablet Take 1 tablet (40 mg total) by mouth daily.  90 tablet  2  . busPIRone (BUSPAR) 7.5 MG tablet Take 1 tablet (7.5 mg total) by mouth 2 (two) times daily.  60 tablet  1  . carvedilol (COREG) 25 MG tablet Take 1 tablet (25 mg total) by mouth 2 (two) times daily with a meal.  60 tablet  6  . citalopram (CELEXA) 20 MG tablet Take one pill daily for the first week and 2 pills daily thereafter.  60 tablet  1  . clopidogrel (PLAVIX) 75 MG tablet Take 1 tablet (75 mg total) by mouth daily.  90 tablet  3  . glucose blood (BAYER CONTOUR TEST) test strip Use as directed       . hydrALAZINE (APRESOLINE) 25 MG tablet Take 1 tablet (25 mg total) by mouth 3 (three) times daily.  90 tablet  11  . insulin glargine (LANTUS SOLOSTAR) 100 UNIT/ML injection Inject 11 Units into the skin at bedtime.  5 pen  3  . Insulin Pen Needle 32G X 6 MM MISC To be used with the Levemir flex pen  100 each  6  . isosorbide mononitrate (IMDUR) 30 MG 24 hr tablet Take 1 tablet (30 mg total) by mouth daily.  90 tablet  2  . metFORMIN (GLUCOPHAGE) 500 MG tablet Take 1-2 tablets (500-1,000 mg total) by mouth 2 (two) times daily. Take 2 tablets in  the morning Take 1 tablet at dinner  120 tablet  3  . Multiple Vitamin (MULTIVITAMIN) tablet Take 1 tablet by mouth daily.        . nitroGLYCERIN (NITROSTAT) 0.4 MG SL tablet Place 1 tablet (0.4 mg total) under the tongue every 5 (five) minutes as needed.  25 tablet  6  . quinapril-hydrochlorothiazide (ACCURETIC) 20-25 MG per tablet Take 1 tablet by mouth daily.  90 tablet  3  . triamcinolone cream (KENALOG) 0.1 % Apply topically 2 (two) times daily.  2268 g  1    Exam: BP 105/77  Pulse 78  Temp 99.4 F (37.4 C) (Oral)  Ht 5\' 11"  (1.803 m)  Wt 159 lb (72.122 kg)  BMI 22.18 kg/m2 Gen: Well NAD HEENT: EOMI,  MMM Lungs: WOB Heart: RRR no MRG Abd: NABS, NT, ND Musc: No effusions. ROM normal, hand grip  strength 4+ bilat Skin: intact in hands/wrists Neuro: CN grossly intact. Sensation symmetrical in hands and arms Exts: Non edematous BL  LE, warm and well perfused.   Results for orders placed in visit on 07/29/12 (from the past 72 hour(s))  POCT GLYCOSYLATED HEMOGLOBIN (HGB A1C)     Status: Normal   Collection Time   07/29/12  3:37 PM      Component Value Range Comment   Hemoglobin A1C 5.9

## 2012-07-29 NOTE — Patient Instructions (Addendum)
Thank you for coming in today The cold sensation in your hand should go away with time Use gloves as needed to keep them warm If your hand becomes painful or you lose strength in your hand come in for evaluation Continue to follow up closely with your cardiologist. Have a great Christmas

## 2012-07-29 NOTE — Assessment & Plan Note (Addendum)
A1c 5.9 today Continue current regimen

## 2012-09-14 ENCOUNTER — Other Ambulatory Visit: Payer: Self-pay | Admitting: Family Medicine

## 2012-09-14 DIAGNOSIS — E785 Hyperlipidemia, unspecified: Secondary | ICD-10-CM

## 2012-09-14 MED ORDER — ATORVASTATIN CALCIUM 40 MG PO TABS: 40.0000 mg | ORAL_TABLET | Freq: Every day | ORAL | Status: DC

## 2012-09-26 ENCOUNTER — Ambulatory Visit (INDEPENDENT_AMBULATORY_CARE_PROVIDER_SITE_OTHER): Payer: Self-pay | Admitting: Family Medicine

## 2012-09-26 ENCOUNTER — Encounter: Payer: Self-pay | Admitting: Family Medicine

## 2012-09-26 VITALS — BP 124/79 | HR 88 | Temp 97.7°F | Ht 71.0 in | Wt 160.0 lb

## 2012-09-26 DIAGNOSIS — I10 Essential (primary) hypertension: Secondary | ICD-10-CM

## 2012-09-26 DIAGNOSIS — R209 Unspecified disturbances of skin sensation: Secondary | ICD-10-CM

## 2012-09-26 DIAGNOSIS — E119 Type 2 diabetes mellitus without complications: Secondary | ICD-10-CM

## 2012-09-26 LAB — POCT GLYCOSYLATED HEMOGLOBIN (HGB A1C): Hemoglobin A1C: 7

## 2012-09-26 NOTE — Patient Instructions (Signed)
You are doing great Please make sure to check you sugar every morning.  Take you insulin every night.  Increase your insulin nightly by 3 units if your morning blood sugar is above 140 Continue taking your blood pressure and cholesterol medicines Good luck with all of your disability efforts Have a great day.

## 2012-09-26 NOTE — Assessment & Plan Note (Signed)
Well controlled today Fewer stressors in life right now No change

## 2012-09-26 NOTE — Progress Notes (Signed)
Paul [name] is a 42 y.o. male who presents to [facility] today for DM f/u   DM: trying to decrease carb intake. Only taking insulin QOD. Taking metformin daily. Not checking sugars but going off of how he feels.   HTN: BP down. Anxiety and nervousness is down after firing from last job and starting new job in a much better environment. Denies CP, SOB, syncope, HA  Cold sensation in hands is getting better.    The following portions of the patient's history were reviewed and updated as appropriate: allergies, current medications, past medical history, family and social history, and problem list.  Patient is a nonsmoker  Past Medical History  Diagnosis Date  . Hypertension   . CAD (coronary artery disease)     Anterior MI 2007 with DES stenting of an occluded LAD, distal 70% stenosis, OM 60% stenosis, circumflex 90% stenosis treated with a drug-eluting stent, PA 90% stenosis treated medically.  Can't January 2013 70-80% diagonal stenosis in a small vessel, OM 50% stenosis, PDA long 75% stenosis. EF 45%. He was managed medically.  . Diabetes mellitus   . Hyperlipidemia   . Cardiomyopathy, ischemic     EF 45%  . History of chest pain 11/25/2010    There are no concerning features in the patient's current presentation. Chest pain rule out performed in the emergency department was negative. The patient does have strong risk factors, considering the fact that he has known coronary artery disease. He is followed by a cardiologist on a routine basis, and his medical management is being optimized. There are no changes to his management that I fee  . Myocardial infarction   . Anxiety     ROS as above otherwise neg.    Medications reviewed. Current Outpatient Prescriptions  Medication Sig Dispense Refill  . aspirin 81 MG EC tablet Take 81 mg by mouth daily.        Marland Kitchen atorvastatin (LIPITOR) 40 MG tablet Take 1 tablet (40 mg total) by mouth daily.  90 tablet  2  . busPIRone (BUSPAR) 7.5 MG tablet  Take 1 tablet (7.5 mg total) by mouth 2 (two) times daily.  60 tablet  1  . carvedilol (COREG) 25 MG tablet Take 1 tablet (25 mg total) by mouth 2 (two) times daily with a meal.  60 tablet  6  . citalopram (CELEXA) 20 MG tablet Take one pill daily for the first week and 2 pills daily thereafter.  60 tablet  1  . clopidogrel (PLAVIX) 75 MG tablet Take 1 tablet (75 mg total) by mouth daily.  90 tablet  3  . glucose blood (BAYER CONTOUR TEST) test strip Use as directed       . hydrALAZINE (APRESOLINE) 25 MG tablet Take 1 tablet (25 mg total) by mouth 3 (three) times daily.  90 tablet  11  . insulin glargine (LANTUS SOLOSTAR) 100 UNIT/ML injection Inject 11 Units into the skin at bedtime.  5 pen  3  . Insulin Pen Needle 32G X 6 MM MISC To be used with the Levemir flex pen  100 each  6  . isosorbide mononitrate (IMDUR) 30 MG 24 hr tablet Take 1 tablet (30 mg total) by mouth daily.  90 tablet  2  . metFORMIN (GLUCOPHAGE) 500 MG tablet Take 1-2 tablets (500-1,000 mg total) by mouth 2 (two) times daily. Take 2 tablets in the morning Take 1 tablet at dinner  120 tablet  3  . Multiple Vitamin (MULTIVITAMIN) tablet Take 1  tablet by mouth daily.        . nitroGLYCERIN (NITROSTAT) 0.4 MG SL tablet Place 1 tablet (0.4 mg total) under the tongue every 5 (five) minutes as needed.  25 tablet  6  . quinapril-hydrochlorothiazide (ACCURETIC) 20-25 MG per tablet Take 1 tablet by mouth daily.  90 tablet  3   No current facility-administered medications for this visit.    Exam: BP 124/79  Pulse 88  Temp(Src) 97.7 F (36.5 C) (Oral)  Ht 5\' 11"  (1.803 m)  Wt 160 lb (72.576 kg)  BMI 22.33 kg/m2 Gen: Well NAD HEENT: EOMI,  MMM Lungs: CTABL Nl WOB Heart: RRR no MRG Exts: Non edematous BL  LE, warm and well perfused.  Neuro: CN grossly intact  Results for orders placed in visit on 09/26/12 (from the past 72 hour(s))  POCT GLYCOSYLATED HEMOGLOBIN (HGB A1C)     Status: None   Collection Time    09/26/12  1:54  PM      Result Value Range   Hemoglobin A1C 7.0

## 2012-09-26 NOTE — Assessment & Plan Note (Signed)
A1c elevated to 7.0 (up from 5.9)  Pt previously well controlled Spent >35min in DM education Pt to take insulin nightly and cincrease by 3 units if am fasting CBG >140.  Pt to check CBG daily

## 2012-09-27 ENCOUNTER — Encounter: Payer: Self-pay | Admitting: Family Medicine

## 2012-09-27 NOTE — Assessment & Plan Note (Signed)
Improving. Continue w/ gloves to improve hand warmth prn.

## 2012-10-18 ENCOUNTER — Emergency Department (HOSPITAL_COMMUNITY)
Admission: EM | Admit: 2012-10-18 | Discharge: 2012-10-19 | Disposition: A | Discharge status: Home / Self Care | Payer: Self-pay | Attending: Emergency Medicine | Admitting: Emergency Medicine

## 2012-10-18 ENCOUNTER — Encounter (HOSPITAL_COMMUNITY): Payer: Self-pay | Admitting: Physical Medicine and Rehabilitation

## 2012-10-18 DIAGNOSIS — E785 Hyperlipidemia, unspecified: Secondary | ICD-10-CM | POA: Insufficient documentation

## 2012-10-18 DIAGNOSIS — E119 Type 2 diabetes mellitus without complications: Secondary | ICD-10-CM | POA: Insufficient documentation

## 2012-10-18 DIAGNOSIS — I10 Essential (primary) hypertension: Secondary | ICD-10-CM | POA: Insufficient documentation

## 2012-10-18 DIAGNOSIS — Z7982 Long term (current) use of aspirin: Secondary | ICD-10-CM | POA: Insufficient documentation

## 2012-10-18 DIAGNOSIS — F411 Generalized anxiety disorder: Secondary | ICD-10-CM | POA: Insufficient documentation

## 2012-10-18 DIAGNOSIS — I252 Old myocardial infarction: Secondary | ICD-10-CM | POA: Insufficient documentation

## 2012-10-18 DIAGNOSIS — Z8673 Personal history of transient ischemic attack (TIA), and cerebral infarction without residual deficits: Secondary | ICD-10-CM | POA: Insufficient documentation

## 2012-10-18 DIAGNOSIS — Z9861 Coronary angioplasty status: Secondary | ICD-10-CM | POA: Insufficient documentation

## 2012-10-18 DIAGNOSIS — I2510 Atherosclerotic heart disease of native coronary artery without angina pectoris: Secondary | ICD-10-CM | POA: Insufficient documentation

## 2012-10-18 DIAGNOSIS — Z8679 Personal history of other diseases of the circulatory system: Secondary | ICD-10-CM | POA: Insufficient documentation

## 2012-10-18 DIAGNOSIS — Z794 Long term (current) use of insulin: Secondary | ICD-10-CM | POA: Insufficient documentation

## 2012-10-18 DIAGNOSIS — Z79899 Other long term (current) drug therapy: Secondary | ICD-10-CM | POA: Insufficient documentation

## 2012-10-18 LAB — GLUCOSE, CAPILLARY: Glucose-Capillary: 95 mg/dL (ref 70–99)

## 2012-10-18 NOTE — ED Notes (Signed)
Pt presents to department for evaluation of hypertension. Pt is participating in cholesterol trial for new medication, was notified today that diastolic was elevated. Per EMS pt states he was anxious and sweaty. Denies pain. Respirations unlabored. Pt is conscious alert and oriented x4.

## 2012-10-19 LAB — POCT I-STAT, CHEM 8
BUN: 17 mg/dL (ref 6–23)
Calcium, Ion: 1.16 mmol/L (ref 1.12–1.23)
Chloride: 98 mEq/L (ref 96–112)
Creatinine, Ser: 1.1 mg/dL (ref 0.50–1.35)
Glucose, Bld: 104 mg/dL — ABNORMAL HIGH (ref 70–99)
HCT: 43 % (ref 39.0–52.0)
Hemoglobin: 14.6 g/dL (ref 13.0–17.0)
Potassium: 3.9 mEq/L (ref 3.5–5.1)
Sodium: 141 mEq/L (ref 135–145)
TCO2: 36 mmol/L (ref 0–100)

## 2012-10-19 NOTE — ED Notes (Signed)
Pt ambulating independently w/ steady gait on d/c in no acute distress, A&Ox4.D/c instructions reviewed w/ pt and family - pt and family deny any further questions or concerns at present.  

## 2012-10-19 NOTE — ED Provider Notes (Signed)
History     CSN: [id number]  Arrival date & time 10/18/12  1623   First MD Initiated Contact with Patient 10/19/12 0016      Chief Complaint  Patient presents with  . Hypertension    (Consider location/radiation/quality/duration/timing/severity/associated sxs/prior treatment) HPI Comments: Patient with a history of HTN presents today with elevated blood pressure.  His blood pressure was found to be elevated while he was participating in a trial for a new cholesterol medication.  He is currently on Carvedilol, Imdur, and Accuretic for his HTN.  He reports that he is taking the medications as prescribed.  Took medications earlier today.  He denies HA, Chest pain, numbness, tingling, vision changes, weakness, facial asymmetry, or SOB at this time.  He reports that he was feeling anxious earlier and that his blood pressure typically rises with anxiety.  He does not normally check his blood pressure at home.  He has a history of Anxiety Disorder.  He does have a PCP.    Patient is a 42 y.o. male presenting with hypertension. The history is provided by the patient.  Hypertension Pertinent negatives include no chest pain, chills, coughing, fever, headaches, nausea, numbness, rash, visual change, vomiting or weakness.    Past Medical History  Diagnosis Date  . Hypertension   . CAD (coronary artery disease)     Anterior MI 2007 with DES stenting of an occluded LAD, distal 70% stenosis, OM 60% stenosis, circumflex 90% stenosis treated with a drug-eluting stent, PA 90% stenosis treated medically.  Can't January 2013 70-80% diagonal stenosis in a small vessel, OM 50% stenosis, PDA long 75% stenosis. EF 45%. He was managed medically.  . Diabetes mellitus   . Hyperlipidemia   . Cardiomyopathy, ischemic     EF 45%  . History of chest pain 11/25/2010    There are no concerning features in the patient's current presentation. Chest pain rule out performed in the emergency department was negative. The  patient does have strong risk factors, considering the fact that he has known coronary artery disease. He is followed by a cardiologist on a routine basis, and his medical management is being optimized. There are no changes to his management that I fee  . Myocardial infarction   . Anxiety     Past Surgical History  Procedure Laterality Date  . Catherization  08/25/11    Dr. Antoine Poche  . Coronary angioplasty with stent placement  207, 2011    4 stents placed    Family History  Problem Relation Age of Onset  . Hypertension Mother   . Hypertension Father   . Diabetes Maternal Aunt   . Diabetes Maternal Uncle   . Cancer Maternal Grandmother     History  Substance Use Topics  . Smoking status: Never Smoker   . Smokeless tobacco: Not on file  . Alcohol Use: No      Review of Systems  Constitutional: Negative for fever and chills.  Respiratory: Negative for cough and shortness of breath.   Cardiovascular: Negative for chest pain.  Gastrointestinal: Negative for nausea and vomiting.  Skin: Negative for color change and rash.  Neurological: Negative for dizziness, seizures, syncope, facial asymmetry, speech difficulty, weakness, light-headedness, numbness and headaches.  All other systems reviewed and are negative.    Allergies  Shrimp  Home Medications   Current Outpatient Rx  Name  Route  Sig  Dispense  Refill  . aspirin 81 MG EC tablet   Oral   Take 81 mg  by mouth daily.           Marland Kitchen atorvastatin (LIPITOR) 40 MG tablet   Oral   Take 1 tablet (40 mg total) by mouth daily.   90 tablet   2   . busPIRone (BUSPAR) 15 MG tablet   Oral   Take 15 mg by mouth daily.         . carvedilol (COREG) 25 MG tablet   Oral   Take 1 tablet (25 mg total) by mouth 2 (two) times daily with a meal.   60 tablet   6     No refills available   . citalopram (CELEXA) 20 MG tablet      Take one pill daily for the first week and 2 pills daily thereafter.   60 tablet   1   .  clopidogrel (PLAVIX) 75 MG tablet   Oral   Take 1 tablet (75 mg total) by mouth daily.   90 tablet   3   . glucose blood (BAYER CONTOUR TEST) test strip      Use as directed          . hydrALAZINE (APRESOLINE) 25 MG tablet   Oral   Take 25 mg by mouth 3 (three) times daily.         . insulin glargine (LANTUS SOLOSTAR) 100 UNIT/ML injection   Subcutaneous   Inject 11 Units into the skin at bedtime.   5 pen   3   . Insulin Pen Needle 32G X 6 MM MISC      To be used with the Levemir flex pen   100 each   6   . isosorbide mononitrate (IMDUR) 30 MG 24 hr tablet   Oral   Take 1 tablet (30 mg total) by mouth daily.   90 tablet   2   . metFORMIN (GLUCOPHAGE) 500 MG tablet   Oral   Take 500-1,000 mg by mouth 2 (two) times daily. Take 2 tablets in the morning Take 1 tablet at dinner         . nitroGLYCERIN (NITROSTAT) 0.4 MG SL tablet   Sublingual   Place 1 tablet (0.4 mg total) under the tongue every 5 (five) minutes as needed.   25 tablet   6   . quinapril-hydrochlorothiazide (ACCURETIC) 20-25 MG per tablet   Oral   Take 1 tablet by mouth daily.   90 tablet   3     BP 157/101  Pulse 74  Temp(Src) 98.9 F (37.2 C) (Oral)  Resp 16  Ht 5\' 11"  (1.803 m)  Wt 165 lb (74.844 kg)  BMI 23.02 kg/m2  SpO2 98%  Physical Exam  Nursing note and vitals reviewed. Constitutional: He appears well-developed and well-nourished. No distress.  HENT:  Head: Normocephalic and atraumatic.  Mouth/Throat: Oropharynx is clear and moist.  Eyes: EOM are normal. Pupils are equal, round, and reactive to light.  Neck: Normal range of motion. Neck supple.  Cardiovascular: Normal rate, regular rhythm, normal heart sounds and intact distal pulses.   Pulmonary/Chest: Effort normal and breath sounds normal. No respiratory distress. He has no wheezes. He has no rales.  Abdominal: Soft. There is no tenderness.  Musculoskeletal: Normal range of motion. He exhibits no edema and no  tenderness.  Neurological: He is alert. He has normal strength. No cranial nerve deficit or sensory deficit. Coordination and gait normal.  Skin: Skin is warm and dry. He is not diaphoretic.  Psychiatric: He has a normal mood  and affect.    ED Course  Procedures (including critical care time)  Labs Reviewed  GLUCOSE, CAPILLARY   No results found.   No diagnosis found.    MDM  Patient presenting with elevated blood pressure.  He is currently on Carvedilol, Imdur, and Accuretic for HTN.  Blood pressure in the ED has been fluctuating between 149/81 and 163/114.  Patient is completely asymptomatic.  No signs of end organ damage.  Creatine WNL.  Therefore, feel that the patient can be discharged home.  Patient instructed to follow up with PCP.  Return precautions discussed.          Pascal Lux [place], PA-C 10/19/12 1404

## 2012-10-19 NOTE — ED Notes (Signed)
Pt reports being on a new trial medication for cholesterol, pt w/ elevated BP tonight and concerned about an adverse reaction to medication.

## 2012-10-20 ENCOUNTER — Other Ambulatory Visit: Payer: Self-pay | Admitting: *Deleted

## 2012-10-20 DIAGNOSIS — I10 Essential (primary) hypertension: Secondary | ICD-10-CM

## 2012-10-20 MED ORDER — HYDRALAZINE HCL 25 MG PO TABS: 25.0000 mg | ORAL_TABLET | Freq: Three times a day (TID) | ORAL | Status: DC

## 2012-10-20 NOTE — ED Provider Notes (Signed)
Medical screening examination/treatment/procedure(s) were performed by non-physician practitioner and as supervising physician I was immediately available for consultation/collaboration.  Olivia Mackie, MD 10/20/12 [phone]

## 2012-10-20 NOTE — Telephone Encounter (Signed)
Received refill request from [facility] pharmacy for hydralazine. Patient calls to say he is out  and needs today.  Paged Dr. Konrad Dolores and he OK's refill on this medication .

## 2012-10-21 ENCOUNTER — Other Ambulatory Visit: Payer: Self-pay

## 2012-11-08 ENCOUNTER — Ambulatory Visit (INDEPENDENT_AMBULATORY_CARE_PROVIDER_SITE_OTHER): Payer: Self-pay | Admitting: Family Medicine

## 2012-11-08 ENCOUNTER — Encounter: Payer: Self-pay | Admitting: Family Medicine

## 2012-11-08 VITALS — BP 127/87 | HR 80 | Ht 71.0 in | Wt 162.0 lb

## 2012-11-08 DIAGNOSIS — I10 Essential (primary) hypertension: Secondary | ICD-10-CM

## 2012-11-08 DIAGNOSIS — F411 Generalized anxiety disorder: Secondary | ICD-10-CM

## 2012-11-08 DIAGNOSIS — L988 Other specified disorders of the skin and subcutaneous tissue: Secondary | ICD-10-CM

## 2012-11-08 DIAGNOSIS — I2510 Atherosclerotic heart disease of native coronary artery without angina pectoris: Secondary | ICD-10-CM

## 2012-11-08 DIAGNOSIS — I96 Gangrene, not elsewhere classified: Secondary | ICD-10-CM

## 2012-11-08 MED ORDER — ISOSORBIDE MONONITRATE ER 30 MG PO TB24: 30.0000 mg | ORAL_TABLET | Freq: Every day | ORAL | Status: DC

## 2012-11-08 MED ORDER — BUSPIRONE HCL 7.5 MG PO TABS: 7.5000 mg | ORAL_TABLET | Freq: Every day | ORAL | Status: DC

## 2012-11-08 MED ORDER — TRAMADOL HCL 50 MG PO TABS: 50.0000 mg | ORAL_TABLET | Freq: Three times a day (TID) | ORAL | Status: DC | PRN

## 2012-11-08 MED ORDER — CITALOPRAM HYDROBROMIDE 20 MG PO TABS: ORAL_TABLET | ORAL | Status: DC

## 2012-11-08 NOTE — Progress Notes (Signed)
Paul [name] is a 42 y.o. male who presents to [facility] today for ED f/u for HTN  HTN: Not taking Hydralazine since Dec.. Went to ED and workup was nml for symptoms or end organ damage. Now taking hydralazine. Denies CP, SOB, syncope, lightheadedness.   Finger pain. Sore on end or L index finger. Previously had the same thing on R finger which healed on it's own.   CAD: Continues w/ clinical trial through [employer]. No lipid check at this time. Denies CP, SOB, DOE.  The following portions of the patient's history were reviewed and updated as appropriate: allergies, current medications, past medical history, family and social history, and problem list.  Patient is a nonsmoker.  Past Medical History  Diagnosis Date  . Hypertension   . CAD (coronary artery disease)     Anterior MI 2007 with DES stenting of an occluded LAD, distal 70% stenosis, OM 60% stenosis, circumflex 90% stenosis treated with a drug-eluting stent, PA 90% stenosis treated medically.  Can't January 2013 70-80% diagonal stenosis in a small vessel, OM 50% stenosis, PDA long 75% stenosis. EF 45%. He was managed medically.  . Diabetes mellitus   . Hyperlipidemia   . Cardiomyopathy, ischemic     EF 45%  . History of chest pain 11/25/2010    There are no concerning features in the patient's current presentation. Chest pain rule out performed in the emergency department was negative. The patient does have strong risk factors, considering the fact that he has known coronary artery disease. He is followed by a cardiologist on a routine basis, and his medical management is being optimized. There are no changes to his management that I fee  . Myocardial infarction   . Anxiety     ROS as above otherwise neg.    Medications reviewed. Current Outpatient Prescriptions  Medication Sig Dispense Refill  . aspirin 81 MG EC tablet Take 81 mg by mouth daily.        Marland Kitchen atorvastatin (LIPITOR) 40 MG tablet Take 1 tablet (40 mg total) by  mouth daily.  90 tablet  2  . busPIRone (BUSPAR) 15 MG tablet Take 15 mg by mouth daily.      . carvedilol (COREG) 25 MG tablet Take 1 tablet (25 mg total) by mouth 2 (two) times daily with a meal.  60 tablet  6  . citalopram (CELEXA) 20 MG tablet Take one pill daily for the first week and 2 pills daily thereafter.  60 tablet  1  . clopidogrel (PLAVIX) 75 MG tablet Take 1 tablet (75 mg total) by mouth daily.  90 tablet  3  . glucose blood (BAYER CONTOUR TEST) test strip Use as directed       . hydrALAZINE (APRESOLINE) 25 MG tablet Take 1 tablet (25 mg total) by mouth 3 (three) times daily.  90 tablet  3  . insulin glargine (LANTUS SOLOSTAR) 100 UNIT/ML injection Inject 11 Units into the skin at bedtime.  5 pen  3  . Insulin Pen Needle 32G X 6 MM MISC To be used with the Levemir flex pen  100 each  6  . isosorbide mononitrate (IMDUR) 30 MG 24 hr tablet Take 1 tablet (30 mg total) by mouth daily.  90 tablet  2  . metFORMIN (GLUCOPHAGE) 500 MG tablet Take 500-1,000 mg by mouth 2 (two) times daily. Take 2 tablets in the morning Take 1 tablet at dinner      . nitroGLYCERIN (NITROSTAT) 0.4 MG SL tablet Place  1 tablet (0.4 mg total) under the tongue every 5 (five) minutes as needed.  25 tablet  6  . quinapril-hydrochlorothiazide (ACCURETIC) 20-25 MG per tablet Take 1 tablet by mouth daily.  90 tablet  3   No current facility-administered medications for this visit.    Exam:  BP 127/87  Pulse 80  Ht 5\' 11"  (1.803 m)  Wt 162 lb (73.483 kg)  BMI 22.6 kg/m2 Gen: Well NAD HEENT: EOMI,  MMM MSK: L 2nd fingertip w/ 1cm circular black and brown and firm area. No induration, erythema, or purulent discharge. Fingertips cold to the touch.  CV: RRR, radial pulses 2+  No results found for this or any previous visit (from the past 72 hour(s)).

## 2012-11-08 NOTE — Patient Instructions (Addendum)
You are doing well Please continue to take all of your medications as prescribed Please follow up with the cardiology team regarding your finger. Please follow up with me as needed  Coronary Artery Disease, Risk Factors Research has shown that the risk of developing coronary artery disease (CAD) and having a heart attack increases with each factor you have. RISK FACTORS YOU CANNOT CHANGE  Your age. Your risk goes up as you get older. Most heart attacks happen to people over the age of 64.  Gender. Men have a greater risk of heart attack than women, and they have attacks earlier in life. However, women are more likely to die from a heart attack.  Heredity. Children of parents with heart disease are more likely to develop it themselves.  Race. African Americans and other ethnic groups have a higher risk, possibly because of high blood pressure, a tendency toward obesity, and diabetes.  Your family. Most people with a strong family history of heart disease have one or more other risk factors. RISK FACTORS YOU CAN CHANGE  Exposure to tobacco smoke. Even secondhand smoke greatly increases the risk for heart disease.  High blood cholesterol may be lowered with changes in diet, activity, and medicines.  High blood pressure makes the heart work harder. This causes the heart muscles to become thick and, eventually, weaker. It also increases your risk of stroke, heart attack, and kidney or heart failure.  Physical inactivity is a risk factor for CAD. Regular physical activity helps prevent heart and blood vessel disease. Exercise helps control blood cholesterol, diabetes, obesity, and it may help lower blood pressure in some people.  Excess body fat, especially belly fat, increases the risk of heart disease and stroke even if there are no other risk factors. Excess weight increases the heart's workload and raises blood pressure and blood cholesterol.  Diabetes seriously increases your risk of  developing CAD. If you have diabetes, you should work with your caregiver to manage it and control other risk factors. OTHER RISK FACTORS FOR CAD  How you respond to stress.  Drinking too much alcohol may raise blood pressure, cause heart failure, and lead to stroke.  Total cholesterol greater than 200 milligrams.  HDL (good) cholesterol less than 40 milligrams. HDL helps keep cholesterol from building up in the walls of the arteries. PREVENTING CAD  Maintain a healthy weight.  Exercise or do physical activity.  Eat a heart-healthy diet low in fat and salt and high in fiber.  Control your blood pressure to keep it below 120 over 80.  Keep your cholesterol at a level that lowers your risk.  Manage diabetes if you have it.  Stop smoking.  Learn how to manage stress. HEART SMART SUBSTITUTIONS  Instead of whole or 2% milk and cream, use skim milk.  Instead of fried foods, eat baked, steamed, boiled, broiled, or microwaved foods.  Instead of lard, butter, palm and coconut oils, cook with unsaturated vegetable oils, such as corn, olive, canola, safflower, sesame, soybean, sunflower, or peanut.  Instead of fatty cuts of meat, eat lean cuts of meat or cut off the fatty parts.  Instead of 1 whole egg in recipes, use 2 egg whites.  Instead of sauces, butter, and salt, season vegetables with herbs and spices.  Instead of regular hard and processed cheeses, eat low-fat, low-sodium cheeses.  Instead of salted potato chips, choose low-fat, unsalted tortilla and potato chips and unsalted pretzels and popcorn.  Instead of sour cream and mayonnaise, use plain  low-fat yogurt, low-fat cottage cheese, or low-fat or "light" sour cream. FOR MORE INFORMATION  National Heart Lung and Blood Institute: https://nielsen.com/ American Heart Association: PopSteam.is Document Released: 10/17/2003 Document Revised: 10/19/2011 Document Reviewed: 10/12/2007 [facility] Patient  Information 2013 [place], [place].

## 2012-11-09 ENCOUNTER — Telehealth: Payer: Self-pay | Admitting: Family Medicine

## 2012-11-09 NOTE — Telephone Encounter (Signed)
Pt was here yesterday and his buspar and celexa was sent to [facility] - they closed yesterday and [facility] [address] has taken over.  The 2 meds that were sent electronically needs to be resent to [facility] Dept since he has the orange card

## 2012-11-09 NOTE — Telephone Encounter (Signed)
Will fwd to MD.  .Paul [name], Darlyne Russian

## 2012-11-10 ENCOUNTER — Encounter: Payer: Self-pay | Admitting: Cardiology

## 2012-11-10 ENCOUNTER — Ambulatory Visit (INDEPENDENT_AMBULATORY_CARE_PROVIDER_SITE_OTHER): Payer: Self-pay | Admitting: Cardiology

## 2012-11-10 VITALS — BP 140/90 | HR 64 | Ht 71.0 in | Wt 163.0 lb

## 2012-11-10 DIAGNOSIS — Z955 Presence of coronary angioplasty implant and graft: Secondary | ICD-10-CM

## 2012-11-10 DIAGNOSIS — Z9861 Coronary angioplasty status: Secondary | ICD-10-CM

## 2012-11-10 DIAGNOSIS — E785 Hyperlipidemia, unspecified: Secondary | ICD-10-CM

## 2012-11-10 DIAGNOSIS — I5022 Chronic systolic (congestive) heart failure: Secondary | ICD-10-CM

## 2012-11-10 DIAGNOSIS — I2510 Atherosclerotic heart disease of native coronary artery without angina pectoris: Secondary | ICD-10-CM

## 2012-11-10 DIAGNOSIS — I10 Essential (primary) hypertension: Secondary | ICD-10-CM

## 2012-11-10 DIAGNOSIS — I776 Arteritis, unspecified: Secondary | ICD-10-CM

## 2012-11-10 LAB — SEDIMENTATION RATE: Sed Rate: 16 mm/hr (ref 0–22)

## 2012-11-10 LAB — RHEUMATOID FACTOR: Rhuematoid fact SerPl-aCnc: <10 IU/mL (ref <=14)

## 2012-11-10 NOTE — Telephone Encounter (Signed)
LMOVM for patient to call regarding health dept needing hard copy of Rx.  Paul [name] L

## 2012-11-10 NOTE — Patient Instructions (Addendum)
The current medical regimen is effective;  continue present plan and medications.  Please have blood work today. (ANA, Sed rate, Rheumatoid Factor and P Anca)  Your physician has requested that you have an echocardiogram. Echocardiography is a painless test that uses sound waves to create images of your heart. It provides your doctor with information about the size and shape of your heart and how well your heart's chambers and valves are working. This procedure takes approximately one hour. There are no restrictions for this procedure.  Follow with Dr Antoine Poche after testing.

## 2012-11-10 NOTE — Telephone Encounter (Signed)
[facility]. [facility] dept needs hard scripts I think. Can someone on blue hall print these off for pick up tomorrow or for fax ifpossible? Thanks

## 2012-11-10 NOTE — Progress Notes (Signed)
HPI The patient presents for evaluation of CAD.  Since I last saw him he says he's not doing that well and he has an ischemic digit on his left index finger. He says he's had one of these on his other hand if it was related to trauma. This healed. He has tenderness with this lesion.  He's not otherwise had complaints with this and. He's not exercising routinely. He says he is under a fair amount of stress. He denies any ongoing chest pressure, neck or arm discomfort. He has had no palpitations, presyncope or syncope. He denies any new PND or orthopnea. He has had no weight gain or edema.  Allergies  Allergen Reactions  . Shrimp (Shellfish Allergy) Swelling    Current Outpatient Prescriptions  Medication Sig Dispense Refill  . aspirin 81 MG EC tablet Take 81 mg by mouth daily.        Marland Kitchen atorvastatin (LIPITOR) 40 MG tablet Take 1 tablet (40 mg total) by mouth daily.  90 tablet  2  . busPIRone (BUSPAR) 7.5 MG tablet Take 1 tablet (7.5 mg total) by mouth daily.  90 tablet  3  . carvedilol (COREG) 25 MG tablet Take 1 tablet (25 mg total) by mouth 2 (two) times daily with a meal.  60 tablet  6  . citalopram (CELEXA) 20 MG tablet Take 1 pill twice a day  180 tablet  3  . clopidogrel (PLAVIX) 75 MG tablet Take 1 tablet (75 mg total) by mouth daily.  90 tablet  3  . glucose blood (BAYER CONTOUR TEST) test strip Use as directed       . hydrALAZINE (APRESOLINE) 25 MG tablet Take 1 tablet (25 mg total) by mouth 3 (three) times daily.  90 tablet  3  . insulin glargine (LANTUS SOLOSTAR) 100 UNIT/ML injection Inject 11 Units into the skin at bedtime.  5 pen  3  . Insulin Pen Needle 32G X 6 MM MISC To be used with the Levemir flex pen  100 each  6  . isosorbide mononitrate (IMDUR) 30 MG 24 hr tablet Take 1 tablet (30 mg total) by mouth daily.  90 tablet  2  . metFORMIN (GLUCOPHAGE) 500 MG tablet Take 500-1,000 mg by mouth 2 (two) times daily. Take 2 tablets in the morning Take 1 tablet at dinner      .  nitroGLYCERIN (NITROSTAT) 0.4 MG SL tablet Place 1 tablet (0.4 mg total) under the tongue every 5 (five) minutes as needed.  25 tablet  6  . quinapril-hydrochlorothiazide (ACCURETIC) 20-25 MG per tablet Take 1 tablet by mouth daily.  90 tablet  3  . traMADol (ULTRAM) 50 MG tablet Take 1 tablet (50 mg total) by mouth every 8 (eight) hours as needed for pain.  30 tablet  0   No current facility-administered medications for this visit.    Past Medical History  Diagnosis Date  . Hypertension   . CAD (coronary artery disease)     Anterior MI 2007 with DES stenting of an occluded LAD, distal 70% stenosis, OM 60% stenosis, circumflex 90% stenosis treated with a drug-eluting stent, PA 90% stenosis treated medically.  Can't January 2013 70-80% diagonal stenosis in a small vessel, OM 50% stenosis, PDA long 75% stenosis. EF 45%. He was managed medically.  . Diabetes mellitus   . Hyperlipidemia   . Cardiomyopathy, ischemic     EF 45%  . History of chest pain 11/25/2010    There are no concerning features  in the patient's current presentation. Chest pain rule out performed in the emergency department was negative. The patient does have strong risk factors, considering the fact that he has known coronary artery disease. He is followed by a cardiologist on a routine basis, and his medical management is being optimized. There are no changes to his management that I fee  . Myocardial infarction   . Anxiety     Past Surgical History  Procedure Laterality Date  . Catherization  08/25/11    Dr. Antoine Poche  . Coronary angioplasty with stent placement  207, 2011    4 stents placed    ROS:  As stated in the HPI and negative for all other systems.  PHYSICAL EXAM BP 140/90  Pulse 64  Ht 5\' 11"  (1.803 m)  Wt 163 lb (73.936 kg)  BMI 22.74 kg/m2 GENERAL:  Well appearing HEENT:  Pupils equal round and reactive, fundi not visualized, oral mucosa unremarkable NECK:  No jugular venous distention, waveform within  normal limits, carotid upstroke brisk and symmetric, no bruits, no thyromegaly LYMPHATICS:  No cervical, inguinal adenopathy LUNGS:  Clear to auscultation bilaterally BACK:  No CVA tenderness CHEST:  Unremarkable HEART:  PMI not displaced or sustained,S1 and S2 within normal limits, no S3, no S4, no clicks, no rubs, no murmurs ABD:  Flat, positive bowel sounds normal in frequency in pitch, no bruits, no rebound, no guarding, no midline pulsatile mass, no hepatomegaly, no splenomegaly EXT:  2 plus pulses throughout, no edema, no cyanosis no clubbing, he has a healing ulceration on the tip of his left index finger. This finger is slightly cooler to the touch with decrease in LVAD perfusion. He otherwise has excellent radial pulses and normal nailbed blanching and all his other fingers. SKIN:  No rashes no nodules NEURO:  Cranial nerves II through XII grossly intact, motor grossly intact throughout PSYCH:  Cognitively intact, oriented to person place and time  EKG:  Sinus rhythm, rate 82, axis within normal limits, intervals within normal limits, anterior T-wave inversions unchanged from previous. 11/10/2012  ASSESSMENT AND PLAN  Finger ulcer:  I am concerned for this representing a vascularity or less likely an embolic phenomenon. I'm going to start with labs to include rheumatoid factor, sedimentation rate, ANA and p-ANCA. And I'm also going to check an echocardiogram as below.  Further evaluation and possible referral will be based on this workup.  CAD (coronary artery disease) -  A he has had no new symptoms. He is tolerating medications.  We will continue with risk reduction.   Cardiomyopathy, ischemic -  The patient seems to be euvolemic. He's on a good medical regimen. No change in therapy is indicated. Given his previously mildly reduced ejection fraction I will check an echocardiogram.  Hypertension -  The blood pressure is at target. No change in medications is indicated. We will  continue with therapeutic lifestyle changes (TLC).  Hyperlipidemia -  He is on target dose of statin per recent guidelines.  His latest LDL was a while ago but was 53. He will continue on the meds as listed.

## 2012-11-11 LAB — ANTI-NUCLEAR AB-TITER (ANA TITER): ANA Titer 1: 1:80 {titer} — ABNORMAL HIGH

## 2012-11-11 LAB — ANA: Anti Nuclear Antibody(ANA): POSITIVE — AB

## 2012-11-14 DIAGNOSIS — I96 Gangrene, not elsewhere classified: Secondary | ICD-10-CM | POA: Insufficient documentation

## 2012-11-14 NOTE — Assessment & Plan Note (Signed)
Necrosis of skin at the tip of the L 2nd finger concerning for vascular insufficiency vs embolic event. Pt w/ similar presentation w/ R finger that self resolved.  Concern for possible vascular impairment, (Raynauds vs other vasculititis) Healing slowly. Will refer to cardiology as pt on Accelerate trial and w/ significant vascular risk factors.  In the meantime pt counseled to wear gloves to improve hand warmth and thus blood flow.

## 2012-11-14 NOTE — Assessment & Plan Note (Signed)
BP nml today as he is now compliant w/ medications.  Continue current therapy

## 2012-11-14 NOTE — Assessment & Plan Note (Signed)
Continue mgt through [facility] for ACCELERATE trial

## 2012-11-18 ENCOUNTER — Ambulatory Visit (HOSPITAL_COMMUNITY): Payer: Self-pay | Attending: Family Medicine | Admitting: Radiology

## 2012-11-18 DIAGNOSIS — I2589 Other forms of chronic ischemic heart disease: Secondary | ICD-10-CM | POA: Insufficient documentation

## 2012-11-18 DIAGNOSIS — I5022 Chronic systolic (congestive) heart failure: Secondary | ICD-10-CM

## 2012-11-18 DIAGNOSIS — I509 Heart failure, unspecified: Secondary | ICD-10-CM | POA: Insufficient documentation

## 2012-11-18 DIAGNOSIS — I2510 Atherosclerotic heart disease of native coronary artery without angina pectoris: Secondary | ICD-10-CM

## 2012-11-18 NOTE — Progress Notes (Signed)
Echocardiogram performed.  

## 2012-11-21 ENCOUNTER — Telehealth: Payer: Self-pay | Admitting: Family Medicine

## 2012-11-21 NOTE — Telephone Encounter (Signed)
Pt dropped form off to have the year changed on it from 2013 to 2014, he would like to be called to have it picked up.

## 2012-11-22 ENCOUNTER — Encounter: Payer: Self-pay | Admitting: Family Medicine

## 2012-11-22 NOTE — Progress Notes (Signed)
Patient ID: Paul [name], male   DOB: [birth year], 42 y.o.   MRN: [id number]   Received disability paperwork previously filled out by Dr. Michiel Sites. Updated and placed w/ staff for pt pick up.   Shelly Flatten, MD Family Medicine PGY-2 11/22/2012, 1:14 PM

## 2012-11-22 NOTE — Telephone Encounter (Signed)
Medical Statement mailed to [facility] at [address]. Shaune Pollack, [place], [place] [address].  Ileana Ladd

## 2012-11-28 ENCOUNTER — Telehealth: Payer: Self-pay | Admitting: Family Medicine

## 2012-11-28 ENCOUNTER — Other Ambulatory Visit: Payer: Self-pay | Admitting: Family Medicine

## 2012-11-28 MED ORDER — METFORMIN HCL 500 MG PO TABS: 500.0000 mg | ORAL_TABLET | Freq: Two times a day (BID) | ORAL | Status: DC

## 2012-11-28 MED ORDER — METFORMIN HCL 500 MG PO TABS: ORAL_TABLET | ORAL | Status: DC

## 2012-11-28 MED ORDER — BUSPIRONE HCL 7.5 MG PO TABS: 7.5000 mg | ORAL_TABLET | Freq: Every day | ORAL | Status: DC

## 2012-11-28 MED ORDER — CLOPIDOGREL BISULFATE 75 MG PO TABS: 75.0000 mg | ORAL_TABLET | Freq: Every day | ORAL | Status: DC

## 2012-11-28 NOTE — Telephone Encounter (Signed)
[facility] has closed and he is needing these meds transferred to [facility]  Metformin vaspirone plavix  [facility] [address]

## 2012-12-08 ENCOUNTER — Telehealth: Payer: Self-pay | Admitting: Family Medicine

## 2012-12-08 DIAGNOSIS — F411 Generalized anxiety disorder: Secondary | ICD-10-CM

## 2012-12-08 MED ORDER — CLOPIDOGREL BISULFATE 75 MG PO TABS: 75.0000 mg | ORAL_TABLET | Freq: Every day | ORAL | Status: DC

## 2012-12-08 MED ORDER — CITALOPRAM HYDROBROMIDE 20 MG PO TABS: ORAL_TABLET | ORAL | Status: DC

## 2012-12-08 MED ORDER — HYDROXYZINE HCL 25 MG PO TABS: 25.0000 mg | ORAL_TABLET | Freq: Three times a day (TID) | ORAL | Status: DC | PRN

## 2012-12-08 NOTE — Telephone Encounter (Signed)
Called and spoke to pt. Prescriptions sent

## 2012-12-08 NOTE — Assessment & Plan Note (Signed)
Pt unable to affort buspar even when only taking PRN. Will rx vistaril prn. Called and explained to pt.

## 2012-12-08 NOTE — Telephone Encounter (Signed)
Patient is calling because since [facility] has closed, he will be getting his Plavix through [facility] on [address] and they need a Rx and he also needs a Rx for Citalopram and something that is on the $4 plan that is equivalent to Buspirone sent to [facility].

## 2012-12-12 ENCOUNTER — Ambulatory Visit (INDEPENDENT_AMBULATORY_CARE_PROVIDER_SITE_OTHER): Payer: No Typology Code available for payment source | Admitting: Family Medicine

## 2012-12-12 ENCOUNTER — Encounter: Payer: Self-pay | Admitting: Family Medicine

## 2012-12-12 VITALS — BP 146/91 | HR 80 | Temp 98.6°F | Ht 71.0 in | Wt 169.0 lb

## 2012-12-12 DIAGNOSIS — R209 Unspecified disturbances of skin sensation: Secondary | ICD-10-CM

## 2012-12-12 DIAGNOSIS — E119 Type 2 diabetes mellitus without complications: Secondary | ICD-10-CM

## 2012-12-12 DIAGNOSIS — I96 Gangrene, not elsewhere classified: Secondary | ICD-10-CM

## 2012-12-12 DIAGNOSIS — I10 Essential (primary) hypertension: Secondary | ICD-10-CM

## 2012-12-12 DIAGNOSIS — L988 Other specified disorders of the skin and subcutaneous tissue: Secondary | ICD-10-CM

## 2012-12-12 LAB — POCT GLYCOSYLATED HEMOGLOBIN (HGB A1C): Hemoglobin A1C: 7.3

## 2012-12-12 MED ORDER — AMLODIPINE BESYLATE 2.5 MG PO TABS: 2.5000 mg | ORAL_TABLET | Freq: Every day | ORAL | Status: DC

## 2012-12-12 MED ORDER — SULFAMETHOXAZOLE-TRIMETHOPRIM 800-160 MG PO TABS: 1.0000 | ORAL_TABLET | Freq: Two times a day (BID) | ORAL | Status: DC

## 2012-12-12 NOTE — Assessment & Plan Note (Addendum)
ANA positive. Concern for lupus, will order DS DNA Tight, cool skin also concerning for possible scleroderma but labs negative  Wound culture sent pt to start Bactrim

## 2012-12-12 NOTE — Progress Notes (Signed)
Paul [name] is a 42 y.o. male who presents to [facility] today for finger pain.  Finger pain. Continues to have finger pain and dead tip of finger since last appointment. Started several months ago as a sore on finger tip. Previously on R index finger and healed on it's own. Clear yellow discharge out of finger. No fevers or rash. Not smoking.   HTN: w/o BP meds until tomorrow. Denies CP, HA, syncope, palpitations. Will pick up Rx tomorrow.   The following portions of the patient's history were reviewed and updated as appropriate: allergies, current medications, past medical history, family and social history, and problem list.  Patient is a nonsmoker.   Past Medical History  Diagnosis Date  . Hypertension   . CAD (coronary artery disease)     Anterior MI 2007 with DES stenting of an occluded LAD, distal 70% stenosis, OM 60% stenosis, circumflex 90% stenosis treated with a drug-eluting stent, PA 90% stenosis treated medically.  Can't January 2013 70-80% diagonal stenosis in a small vessel, OM 50% stenosis, PDA long 75% stenosis. EF 45%. He was managed medically.  . Diabetes mellitus   . Hyperlipidemia   . Cardiomyopathy, ischemic     EF 45%  . History of chest pain 11/25/2010  . Myocardial infarction   . Anxiety     ROS as above otherwise neg.    Medications reviewed. Current Outpatient Prescriptions  Medication Sig Dispense Refill  . aspirin 81 MG EC tablet Take 81 mg by mouth daily.        Marland Kitchen atorvastatin (LIPITOR) 40 MG tablet Take 1 tablet (40 mg total) by mouth daily.  90 tablet  2  . carvedilol (COREG) 25 MG tablet Take 1 tablet (25 mg total) by mouth 2 (two) times daily with a meal.  60 tablet  6  . citalopram (CELEXA) 20 MG tablet Take 1 pill twice a day  180 tablet  3  . clopidogrel (PLAVIX) 75 MG tablet Take 1 tablet (75 mg total) by mouth daily.  90 tablet  3  . glucose blood (BAYER CONTOUR TEST) test strip Use as directed       . hydrALAZINE (APRESOLINE) 25 MG tablet Take 1  tablet (25 mg total) by mouth 3 (three) times daily.  90 tablet  3  . hydrOXYzine (ATARAX/VISTARIL) 25 MG tablet Take 1 tablet (25 mg total) by mouth 3 (three) times daily as needed for anxiety.  30 tablet  0  . insulin glargine (LANTUS SOLOSTAR) 100 UNIT/ML injection Inject 11 Units into the skin at bedtime.  5 pen  3  . Insulin Pen Needle 32G X 6 MM MISC To be used with the Levemir flex pen  100 each  6  . isosorbide mononitrate (IMDUR) 30 MG 24 hr tablet Take 1 tablet (30 mg total) by mouth daily.  90 tablet  2  . metFORMIN (GLUCOPHAGE) 500 MG tablet Take 2 tablets in the morning. Take 1 tablet at dinner  90 tablet  6  . nitroGLYCERIN (NITROSTAT) 0.4 MG SL tablet Place 1 tablet (0.4 mg total) under the tongue every 5 (five) minutes as needed.  25 tablet  6  . quinapril-hydrochlorothiazide (ACCURETIC) 20-25 MG per tablet Take 1 tablet by mouth daily.  90 tablet  3  . traMADol (ULTRAM) 50 MG tablet Take 1 tablet (50 mg total) by mouth every 8 (eight) hours as needed for pain.  30 tablet  0   No current facility-administered medications for this visit.  Exam:  BP 146/91  Pulse 80  Temp(Src) 98.6 F (37 C) (Oral)  Ht 5\' 11"  (1.803 m)  Wt 169 lb (76.658 kg)  BMI 23.58 kg/m2 Gen: Well NAD HEENT: EOMI,  MMM Skin: R index fingertip necrotic (~77mm size) w/ minimal purulent drainage. No erythema. Skin over fingers and hands tight and mildly difficult for pt to move.  No results found for this or any previous visit (from the past 72 hour(s)).

## 2012-12-12 NOTE — Patient Instructions (Addendum)
Your finger continues to worry me. Please keep your hand and finger warms Wear a glove when outside unless it is very warm/sunny Start taking your antibiotics I will let you know if any of your bloodwork comes back abnormal If you become lightheaded with the new blood pressure medication, then stop taking it. Come to see me again in 4 wks or sooner if your finger is not imrpoving.

## 2012-12-13 LAB — ANTI-DNA ANTIBODY, DOUBLE-STRANDED: ds DNA Ab: 3 IU/mL (ref <30)

## 2012-12-13 LAB — ANTI-SCLERODERMA ANTIBODY: Scleroderma (Scl-70) (ENA) Antibody, IgG: 3 AU/mL (ref <30)

## 2012-12-13 LAB — CENTROMERE ANTIBODIES: Centromere Ab Screen: 1 AU/mL (ref <30)

## 2012-12-13 NOTE — Assessment & Plan Note (Signed)
Adding amlodipine for improved peripheral blood flow

## 2012-12-13 NOTE — Assessment & Plan Note (Addendum)
Pt w/ significant PVD and concern for buerger's/raynaud's  Workup for lupus and scleroderma as above was negative Starting amlodipine Pt to use warm compresses and to wear gloves outdoors.

## 2012-12-15 LAB — WOUND CULTURE
Gram Stain: NONE SEEN
Gram Stain: NONE SEEN
Gram Stain: NONE SEEN

## 2012-12-22 ENCOUNTER — Telehealth: Payer: Self-pay | Admitting: *Deleted

## 2012-12-22 NOTE — Telephone Encounter (Signed)
Pt came in today stating that scab had fallen off finger. Advised to keep finger clean and dry & covered, small amount of neosporin. Finger is pink , warm with areas of new skin. Pt verbalized understanding.No further concerns. Wyatt Haste, RN-BSN

## 2013-01-11 ENCOUNTER — Encounter: Payer: Self-pay | Admitting: Family Medicine

## 2013-01-11 ENCOUNTER — Ambulatory Visit (INDEPENDENT_AMBULATORY_CARE_PROVIDER_SITE_OTHER): Payer: No Typology Code available for payment source | Admitting: Family Medicine

## 2013-01-11 VITALS — BP 148/86 | HR 79 | Temp 98.3°F | Ht 71.0 in | Wt 162.0 lb

## 2013-01-11 DIAGNOSIS — E119 Type 2 diabetes mellitus without complications: Secondary | ICD-10-CM

## 2013-01-11 DIAGNOSIS — I10 Essential (primary) hypertension: Secondary | ICD-10-CM

## 2013-01-11 DIAGNOSIS — I96 Gangrene, not elsewhere classified: Secondary | ICD-10-CM

## 2013-01-11 DIAGNOSIS — R209 Unspecified disturbances of skin sensation: Secondary | ICD-10-CM

## 2013-01-11 DIAGNOSIS — L988 Other specified disorders of the skin and subcutaneous tissue: Secondary | ICD-10-CM

## 2013-01-11 MED ORDER — INSULIN GLARGINE 100 UNIT/ML ~~LOC~~ SOLN: 14.0000 [IU] | Freq: Every day | SUBCUTANEOUS | Status: DC

## 2013-01-11 NOTE — Progress Notes (Addendum)
Paul [name] is a 42 y.o. male who presents to [facility] today for finger ulceration infection  Finger ulceration: Scab came off several weeks ago. No longer painful. No discharge. No other fingers becoming ulcerated. Has not filled the AMlodipine yet  HTN: has not filled amlodipine. No CP, palp, SOB. Taking Coreg, hydralazine, imdur.  CAD: not taking Placvix as it cost $25 adn could not afford. Still on ACCELERATE trial  DM: only occasionally checks his BG. Typically in 140s. Takes lantus 13 units at night. Spent ~72min discussng mechanism of action of metformin and of lantus and how he needs to titrate his dose based on am sugars.   Pt goes to opthomologist yearly and just had appt a wk or two ago.  The following portions of the patient's history were reviewed and updated as appropriate: allergies, current medications, past medical history, family and social history, and problem list.  Patient is a nonsmoker.   Past Medical History  Diagnosis Date  . Hypertension   . CAD (coronary artery disease)     Anterior MI 2007 with DES stenting of an occluded LAD, distal 70% stenosis, OM 60% stenosis, circumflex 90% stenosis treated with a drug-eluting stent, PA 90% stenosis treated medically.  Can't January 2013 70-80% diagonal stenosis in a small vessel, OM 50% stenosis, PDA long 75% stenosis. EF 45%. He was managed medically.  . Diabetes mellitus   . Hyperlipidemia   . Cardiomyopathy, ischemic     EF 45%  . History of chest pain 11/25/2010  . Myocardial infarction   . Anxiety     ROS as above otherwise neg.    Medications reviewed. Current Outpatient Prescriptions  Medication Sig Dispense Refill  . amLODipine (NORVASC) 2.5 MG tablet Take 1 tablet (2.5 mg total) by mouth daily.  30 tablet  3  . aspirin 81 MG EC tablet Take 81 mg by mouth daily.        Marland Kitchen atorvastatin (LIPITOR) 40 MG tablet Take 1 tablet (40 mg total) by mouth daily.  90 tablet  2  . carvedilol (COREG) 25 MG tablet Take 1  tablet (25 mg total) by mouth 2 (two) times daily with a meal.  60 tablet  6  . citalopram (CELEXA) 20 MG tablet Take 1 pill twice a day  180 tablet  3  . clopidogrel (PLAVIX) 75 MG tablet Take 1 tablet (75 mg total) by mouth daily.  90 tablet  3  . glucose blood (BAYER CONTOUR TEST) test strip Use as directed       . hydrALAZINE (APRESOLINE) 25 MG tablet Take 1 tablet (25 mg total) by mouth 3 (three) times daily.  90 tablet  3  . hydrOXYzine (ATARAX/VISTARIL) 25 MG tablet Take 1 tablet (25 mg total) by mouth 3 (three) times daily as needed for anxiety.  30 tablet  0  . insulin glargine (LANTUS SOLOSTAR) 100 UNIT/ML injection Inject 11 Units into the skin at bedtime.  5 pen  3  . Insulin Pen Needle 32G X 6 MM MISC To be used with the Levemir flex pen  100 each  6  . isosorbide mononitrate (IMDUR) 30 MG 24 hr tablet Take 1 tablet (30 mg total) by mouth daily.  90 tablet  2  . metFORMIN (GLUCOPHAGE) 500 MG tablet Take 2 tablets in the morning. Take 1 tablet at dinner  90 tablet  6  . nitroGLYCERIN (NITROSTAT) 0.4 MG SL tablet Place 1 tablet (0.4 mg total) under the tongue every 5 (five) minutes  as needed.  25 tablet  6  . quinapril-hydrochlorothiazide (ACCURETIC) 20-25 MG per tablet Take 1 tablet by mouth daily.  90 tablet  3  . sulfamethoxazole-trimethoprim (BACTRIM DS,SEPTRA DS) 800-160 MG per tablet Take 1 tablet by mouth 2 (two) times daily.  20 tablet  0  . traMADol (ULTRAM) 50 MG tablet Take 1 tablet (50 mg total) by mouth every 8 (eight) hours as needed for pain.  30 tablet  0   No current facility-administered medications for this visit.    Exam: BP 148/86  Pulse 79  Temp(Src) 98.3 F (36.8 C) (Oral)  Ht 5\' 11"  (1.803 m)  Wt 162 lb (73.483 kg)  BMI 22.6 kg/m2 Gen: Well NAD HEENT: EOMI,  MMM Skin: fingertips cool to touch. Scab on L index finger is gone w/ healthy pink skin now present. Intact and now erythema or induration.   No results found for this or any previous visit (from  the past 72 hour(s)).

## 2013-01-11 NOTE — Assessment & Plan Note (Signed)
Slightly elevated today Pt to start amlodipine

## 2013-01-11 NOTE — Assessment & Plan Note (Signed)
Still cold to touch.  Pt to finally start amlodipine Workup for lupus or scleroderma neg,.

## 2013-01-11 NOTE — Progress Notes (Signed)
To have it count for DM foot you have to put in [facility] diabetic foot exam order, document in the tab and then complete the order when you are done.  The only way optho counts is if the optho faxes records of exam to [place].

## 2013-01-11 NOTE — Patient Instructions (Addendum)
Please start taking your Lantus in the morning with breakfast.  Please increase your dose to 14 units.  Please start taking the amlodipine for your blood pressure and blood flow to your fingers Please call the cardiologist about your Plavix. You really need to take this medicine.  Please come back in 6 months or sooner if needed.  Have a great summer!!!  Cholesterol Cholesterol is a type of fat. Your body needs a small amount of cholesterol, but too much can cause health problems. Certain problems include heart attacks, strokes, and not enough blood flow to your heart, brain, kidneys, or feet. You get cholesterol in 2 ways:  Naturally.  By eating certain foods. HOME CARE  Eat a low-fat diet:  Eat less eggs, whole dairy products (whole milk, cheese, and butter), fatty meats, and fried foods.  Eat more fruits, vegetables, whole-wheat breads, lean chicken, and fish.  Follow your exercise program as told by your doctor.  Keep your weight at a healthy level. Talk to your doctor about what is right for you.  Only take medicine as told by your doctor.  Get your cholesterol checked once a year or as told by your doctor. MAKE SURE YOU:  Understand these instructions.  Will watch your condition.  Will get help right away if you are not doing well or get worse. Document Released: 10/23/2008 Document Revised: 10/19/2011 Document Reviewed: 10/23/2008 [facility] Patient Information 2014 [place], [place]. A1c, Hemoglobin A1c The A1c (hemoglobin A1c, glycosylated hemoglobin) test checks the average amount of sugar (glucose) in the blood over the last 2 to 3 months. It does this by measuring the concentration of glycosylated hemoglobin. As glucose circulates in the blood, some of it binds to hemoglobin A. This is the main form of hemoglobin in adults. Hemoglobin is a red protein that carries oxygen in the red blood cells (RBCs). Once the glucose is bound to the hemoglobin A, it remains there for  the life of the red blood cell (about 120 days). This combination of glucose and hemoglobin A is called A1c. Increased glucose in the blood, increases the hemoglobin A1c. A1c levels do not change quickly but will shift as RBCs are replaced. A1c is a valuable test because it enables you to know how your glucose has been controlled over the past 3 months.  5% A1c  Estimated Average Glucose mg/dL: 97  6% Z6X  Estimated Average Glucose mg/dL: 096  7% E4V  Estimated Average Glucose mg/dL: 409  8% W1X  Estimated Average Glucose mg/dL: 914  9% N8G  Estimated Average Glucose mg/dL: 956  21% H0Q  Estimated Average Glucose mg/dL: 657  84% O9G  Estimated Average Glucose mg/dL: 295  28% U1L  Estimated Average Glucose mg/dL: 244 The American Diabetes Association (ADA) recommends testing your A1c level 4 times each year if you have type 1 or type 2 diabetes and use insulin; or 2 times each year if you have type 2 diabetes and do not use insulin. When someone is first diagnosed with diabetes or if control is not good, A1c may be ordered more frequently. PREPARATION FOR TEST No preparation or fasting is necessary for this blood sample. NORMAL FINDINGS   Adults without diabetes: 2.2 to 4.8%  Children without diabetes: 1.8 to 4.0%  Good diabetic control: 2.5 to 5.9%  Fair diabetic control: 6 to 8%  Poor diabetic control: greater than 8% The values of A1c may be falsely low in pregnancy, in disorders with shortened red blood cell lives, or  in sickle cell disease or trait (carrier). The values may be falsely high in disorders with longer red cell lives, or in people with Thallassemia, kidney failure, or iron deficiency anemia. Ranges for normal findings may vary among different laboratories and hospitals. You should always check with your doctor after having lab work or other tests done to discuss the meaning of your test results and whether your values are considered within normal  limits. MEANING OF TEST  Your caregiver will go over the test results with you and discuss the importance and meaning of your results, as well as treatment options and the need for additional tests if necessary. If your A1c is greater than 7%, discuss treatment options with your caregiver. OBTAINING THE TEST RESULTS  It is your responsibility to obtain your test results. Ask the lab or department performing the test when and how you will get your results. Document Released: 08/18/2004 Document Revised: 10/19/2011 Document Reviewed: 06/23/2012 [facility] Patient Information 2014 [place], [place].

## 2013-01-11 NOTE — Assessment & Plan Note (Signed)
Increase lantus to 14units QAM Pt to check sugar tonight and not take lantus Pt to then check CBG in the morning and dose accordingly If CBG greater than 150 then increase lantus by 1 unit and if <100 to decrease by 1 unit Pt very mindful of diabetic diet.

## 2013-01-11 NOTE — Assessment & Plan Note (Signed)
Completely resolved Helps that we are in the summer weather now Pt to start amlodipine

## 2013-01-17 ENCOUNTER — Telehealth: Payer: Self-pay | Admitting: *Deleted

## 2013-01-17 NOTE — Telephone Encounter (Signed)
Patient came in for Accelerate study visit 006 on 01/17/13. Patient had B/P of 180/134. I had patient take the 3 prescribed blood pressure meds that he had not taken today. We checked B/P again which was 180/120. I had patient relax in recliner in dark room for 30 minutes. B/P still 176/115. i was unable to get patient an appointment at the office. I paged Ward Givens NP. He instructed me to give patient Hydralazine 25 mg now and increase Hydralazine to 50 mg tid. Patient is to have a scheduled blood pressure check on 01/18/13 at 11:00 am with nurse.Patient was given written instructions and verbalized understanding.Patient blood pressure was 129/88 when leaving the research clinic. I notified Myla Eady  pharmacist for MAP program at patient request of change in the Hydralazine medicine.

## 2013-01-20 ENCOUNTER — Telehealth: Payer: Self-pay | Admitting: *Deleted

## 2013-01-20 NOTE — Telephone Encounter (Signed)
[facility] called and verified that Lantus is now 14 units instead of 11 - no further concerns. Wyatt Haste, RN-BSN

## 2013-01-23 ENCOUNTER — Ambulatory Visit (INDEPENDENT_AMBULATORY_CARE_PROVIDER_SITE_OTHER): Payer: No Typology Code available for payment source

## 2013-01-23 ENCOUNTER — Telehealth: Payer: Self-pay | Admitting: *Deleted

## 2013-01-23 VITALS — BP 110/72 | HR 80 | Ht 71.0 in | Wt 163.4 lb

## 2013-01-23 DIAGNOSIS — I10 Essential (primary) hypertension: Secondary | ICD-10-CM

## 2013-01-23 DIAGNOSIS — E119 Type 2 diabetes mellitus without complications: Secondary | ICD-10-CM

## 2013-01-23 NOTE — Telephone Encounter (Signed)
Please fax new script with Lantus change to 14 units to the [facility]. Wyatt Haste, RN-BSN

## 2013-01-23 NOTE — Progress Notes (Signed)
Patient came to office for B/P check.Stated PCP added amlodipine 2.5 mg daily. Advised to continue same medication.

## 2013-01-25 NOTE — Telephone Encounter (Signed)
Received pt request Support staff to fax updated lantus Rx to [facility] as I am out of office  Shelly Flatten, MD Family Medicine PGY-2 01/25/2013, 4:11 PM

## 2013-01-26 ENCOUNTER — Encounter: Payer: Self-pay | Admitting: *Deleted

## 2013-01-26 ENCOUNTER — Other Ambulatory Visit: Payer: Self-pay | Admitting: Family Medicine

## 2013-01-26 DIAGNOSIS — E119 Type 2 diabetes mellitus without complications: Secondary | ICD-10-CM

## 2013-01-26 MED ORDER — INSULIN GLARGINE 100 UNIT/ML ~~LOC~~ SOLN: 14.0000 [IU] | Freq: Every day | SUBCUTANEOUS | Status: DC

## 2013-01-26 NOTE — Addendum Note (Signed)
Addended by: Bonita Quin E on: 01/26/2013 11:45 AM   Modules accepted: Orders

## 2013-01-31 ENCOUNTER — Other Ambulatory Visit: Payer: Self-pay | Admitting: *Deleted

## 2013-01-31 DIAGNOSIS — I10 Essential (primary) hypertension: Secondary | ICD-10-CM

## 2013-02-01 ENCOUNTER — Telehealth: Payer: Self-pay | Admitting: Family Medicine

## 2013-02-01 MED ORDER — ISOSORBIDE MONONITRATE ER 30 MG PO TB24: 30.0000 mg | ORAL_TABLET | Freq: Every day | ORAL | Status: DC

## 2013-02-01 NOTE — Telephone Encounter (Signed)
Rx called in verbally to [facility] [place] at pts request. Adaja Wander, Maryjo [place]

## 2013-02-01 NOTE — Telephone Encounter (Signed)
Patient is stating the the Pharmacy will not fill his medications until they talk with [facility]. He can not afford 90 qty and wants a smaller amount and they will not do that unless [facility] states that we can. Patient is waiting at Pharmacy now [facility]. JW

## 2013-02-01 NOTE — Telephone Encounter (Signed)
This encounter was created in error - please disregard.

## 2013-02-02 ENCOUNTER — Other Ambulatory Visit: Payer: Self-pay | Admitting: *Deleted

## 2013-02-02 DIAGNOSIS — I10 Essential (primary) hypertension: Secondary | ICD-10-CM

## 2013-02-02 NOTE — Telephone Encounter (Signed)
Spoke with pharmacy and patient picked up a 30 day supply yesterday, which he could afford.  He has been doing this every month without a problem.  Pharmacy states there was not a problem.  Ronie Barnhart, Darlyne Russian, CMA

## 2013-02-06 MED ORDER — ISOSORBIDE MONONITRATE ER 30 MG PO TB24: 30.0000 mg | ORAL_TABLET | Freq: Every day | ORAL | Status: DC

## 2013-02-14 ENCOUNTER — Telehealth: Payer: Self-pay | Admitting: Family Medicine

## 2013-02-17 ENCOUNTER — Other Ambulatory Visit: Payer: Self-pay | Admitting: *Deleted

## 2013-02-17 DIAGNOSIS — E785 Hyperlipidemia, unspecified: Secondary | ICD-10-CM

## 2013-02-19 MED ORDER — ATORVASTATIN CALCIUM 40 MG PO TABS: 40.0000 mg | ORAL_TABLET | Freq: Every day | ORAL | Status: DC

## 2013-02-22 NOTE — Telephone Encounter (Signed)
error 

## 2013-02-28 ENCOUNTER — Ambulatory Visit (INDEPENDENT_AMBULATORY_CARE_PROVIDER_SITE_OTHER): Payer: No Typology Code available for payment source | Admitting: Family Medicine

## 2013-02-28 ENCOUNTER — Encounter: Payer: Self-pay | Admitting: Family Medicine

## 2013-02-28 VITALS — BP 106/67 | HR 76 | Temp 98.3°F | Wt 165.0 lb

## 2013-02-28 DIAGNOSIS — E785 Hyperlipidemia, unspecified: Secondary | ICD-10-CM

## 2013-02-28 DIAGNOSIS — R110 Nausea: Secondary | ICD-10-CM | POA: Insufficient documentation

## 2013-02-28 MED ORDER — ONDANSETRON HCL 4 MG PO TABS: 4.0000 mg | ORAL_TABLET | Freq: Three times a day (TID) | ORAL | Status: DC | PRN

## 2013-02-28 MED ORDER — ATORVASTATIN CALCIUM 40 MG PO TABS: 40.0000 mg | ORAL_TABLET | Freq: Every day | ORAL | Status: DC

## 2013-02-28 NOTE — Addendum Note (Signed)
Addended by: Konrad Dolores, DAVID J on: 02/28/2013 04:55 PM   Modules accepted: Orders

## 2013-02-28 NOTE — Progress Notes (Signed)
Patient ID: Paul [name], male   DOB: [birth year], 42 y.o.   MRN: [id number]   Pt w/ nausea that slowly worsens w/ lipitor. Pt would like to stop. Pt on ACCELERATE trial and would defer lipid mgt to [facility] team. Counseled pt to continue lipitor. - will Rx Zofran for nausea and allow Cards to make statin changes as appropriate given ACCELERATE trial.   BP 106/67  Pulse 76  Temp(Src) 98.3 F (36.8 C) (Oral)  Wt 165 lb (74.844 kg)  BMI 23.02 kg/m2   Shelly Flatten, MD Family Medicine PGY-3 02/28/2013, 4:50 PM

## 2013-02-28 NOTE — Assessment & Plan Note (Signed)
Associated w/ lipitor Zofran PRN

## 2013-03-03 ENCOUNTER — Telehealth: Payer: Self-pay | Admitting: Family Medicine

## 2013-03-03 NOTE — Telephone Encounter (Signed)
Patient is calling because the [facility] no longer fills Zofran or Lipitor so he needs to be able to get it or something comparable from somewhere else.

## 2013-03-07 NOTE — Telephone Encounter (Signed)
Called and left a voicemail message Pt cholesterol medications need to come from [facility] if unable to afford Lipitor.  Shelly Flatten, MD Family Medicine PGY-3 03/07/2013, 2:05 PM

## 2013-03-13 ENCOUNTER — Telehealth: Payer: Self-pay | Admitting: Cardiology

## 2013-03-13 NOTE — Telephone Encounter (Signed)
New Prob  Pt states wants to know what he can take other than Lipitor. He said that he was off but his other provider put back on it. He said that he can not afford it.  He wants to know what he can take.

## 2013-03-13 NOTE — Telephone Encounter (Signed)
In reviewing the pt's chart it looks like the pt was having nausea from Lipitor.  Dr Konrad Dolores instructed the pt to remain on this medication and have further adjustments in cholesterol medication made by Cardiology. The pt also cannot afford the cost of Lipitor since the Health Department does not cover the medication. Left message on machine for pt to contact the office.

## 2013-03-13 NOTE — Telephone Encounter (Signed)
Can you all call Paul [name] to f/u on cholesterol medicaitons. Was he able to get a hold of [facility] to have this changed/prescribed

## 2013-03-13 NOTE — Telephone Encounter (Signed)
I spoke with the pt and the pt experienced nausea and vomiting for the past year due to Lipitor. The pt no longer has a supply of Lipitor and he would like to know what other statin drug he can try. Cost is an issue and the pt needs a generic statin. The pt does not remember trying any other statins in the past. I made the pt aware that Dr Antoine Poche will not be in the office again until next week and he should expect a call from our office next week in regards to his medication.

## 2013-03-14 NOTE — Telephone Encounter (Signed)
Spoke with pt and he states that Dr. Antoine Poche will look into changing his cholesterol medication when he comes in on Monday.  States that he might need a different rx besides xanax too due to the price being $90 at [facility].  Waiting for them to open so I can check on this for him.  Jazmin Hartsell,CMA

## 2013-03-17 ENCOUNTER — Other Ambulatory Visit: Payer: Self-pay | Admitting: Family Medicine

## 2013-03-17 DIAGNOSIS — I10 Essential (primary) hypertension: Secondary | ICD-10-CM

## 2013-03-17 MED ORDER — QUINAPRIL-HYDROCHLOROTHIAZIDE 20-25 MG PO TABS: 1.0000 | ORAL_TABLET | Freq: Every day | ORAL | Status: DC

## 2013-03-17 NOTE — Progress Notes (Signed)
Refill request for accuretic received and refill sent to [facility].   Shelly Flatten, MD Family Medicine PGY-3 03/17/2013, 12:18 PM

## 2013-03-19 NOTE — Telephone Encounter (Signed)
He can start Pravachol 40 mg daily and stop the Lipitor and we can get a lipid profile in 10 wks.  Disp number 31 with 11 refills one PO QHS.

## 2013-03-23 ENCOUNTER — Other Ambulatory Visit: Payer: Self-pay | Admitting: *Deleted

## 2013-03-23 NOTE — Telephone Encounter (Signed)
Left message for pt of instructions and to call to let us know what pharmacy to send RX to and that he received this message.

## 2013-03-23 NOTE — Telephone Encounter (Signed)
Per pt - states he has atorvastatin and will take it for the time being.  He will call back if problems, concerns or questions

## 2013-03-23 NOTE — Telephone Encounter (Signed)
Follow up   Pt is returning call, he asked if you could call him back.

## 2013-04-01 ENCOUNTER — Emergency Department (HOSPITAL_BASED_OUTPATIENT_CLINIC_OR_DEPARTMENT_OTHER)
Admission: EM | Admit: 2013-04-01 | Discharge: 2013-04-01 | Disposition: A | Discharge status: Home / Self Care | Payer: No Typology Code available for payment source | Attending: Emergency Medicine | Admitting: Emergency Medicine

## 2013-04-01 ENCOUNTER — Encounter (HOSPITAL_BASED_OUTPATIENT_CLINIC_OR_DEPARTMENT_OTHER): Payer: Self-pay

## 2013-04-01 DIAGNOSIS — I2510 Atherosclerotic heart disease of native coronary artery without angina pectoris: Secondary | ICD-10-CM | POA: Diagnosis not present

## 2013-04-01 DIAGNOSIS — Y92410 Unspecified street and highway as the place of occurrence of the external cause: Secondary | ICD-10-CM | POA: Diagnosis not present

## 2013-04-01 DIAGNOSIS — S0993XA Unspecified injury of face, initial encounter: Secondary | ICD-10-CM | POA: Diagnosis present

## 2013-04-01 DIAGNOSIS — E785 Hyperlipidemia, unspecified: Secondary | ICD-10-CM | POA: Diagnosis not present

## 2013-04-01 DIAGNOSIS — Z8679 Personal history of other diseases of the circulatory system: Secondary | ICD-10-CM | POA: Diagnosis not present

## 2013-04-01 DIAGNOSIS — I10 Essential (primary) hypertension: Secondary | ICD-10-CM | POA: Insufficient documentation

## 2013-04-01 DIAGNOSIS — S4980XA Other specified injuries of shoulder and upper arm, unspecified arm, initial encounter: Secondary | ICD-10-CM | POA: Diagnosis not present

## 2013-04-01 DIAGNOSIS — E119 Type 2 diabetes mellitus without complications: Secondary | ICD-10-CM | POA: Diagnosis not present

## 2013-04-01 DIAGNOSIS — I252 Old myocardial infarction: Secondary | ICD-10-CM | POA: Diagnosis not present

## 2013-04-01 DIAGNOSIS — Z7902 Long term (current) use of antithrombotics/antiplatelets: Secondary | ICD-10-CM | POA: Insufficient documentation

## 2013-04-01 DIAGNOSIS — Z7982 Long term (current) use of aspirin: Secondary | ICD-10-CM | POA: Insufficient documentation

## 2013-04-01 DIAGNOSIS — Z9861 Coronary angioplasty status: Secondary | ICD-10-CM | POA: Insufficient documentation

## 2013-04-01 DIAGNOSIS — Y9389 Activity, other specified: Secondary | ICD-10-CM | POA: Insufficient documentation

## 2013-04-01 DIAGNOSIS — Z794 Long term (current) use of insulin: Secondary | ICD-10-CM | POA: Diagnosis not present

## 2013-04-01 DIAGNOSIS — F411 Generalized anxiety disorder: Secondary | ICD-10-CM | POA: Insufficient documentation

## 2013-04-01 DIAGNOSIS — M542 Cervicalgia: Secondary | ICD-10-CM

## 2013-04-01 DIAGNOSIS — S46909A Unspecified injury of unspecified muscle, fascia and tendon at shoulder and upper arm level, unspecified arm, initial encounter: Secondary | ICD-10-CM | POA: Insufficient documentation

## 2013-04-01 MED ORDER — NAPROXEN 250 MG PO TABS: 500.0000 mg | ORAL_TABLET | Freq: Once | ORAL | Status: DC

## 2013-04-01 MED ORDER — CYCLOBENZAPRINE HCL 10 MG PO TABS: 10.0000 mg | ORAL_TABLET | Freq: Two times a day (BID) | ORAL | Status: DC | PRN

## 2013-04-01 MED ORDER — NAPROXEN 500 MG PO TABS: 500.0000 mg | ORAL_TABLET | Freq: Two times a day (BID) | ORAL | Status: DC

## 2013-04-01 MED ORDER — CYCLOBENZAPRINE HCL 10 MG PO TABS: 10.0000 mg | ORAL_TABLET | Freq: Once | ORAL | Status: DC

## 2013-04-01 NOTE — ED Provider Notes (Signed)
Medical screening examination/treatment/procedure(s) were performed by non-physician practitioner and as supervising physician I was immediately available for consultation/collaboration.  Ahmed Inniss M Lyfe Monger, MD 04/01/13 2240 
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
## 2013-04-20 ENCOUNTER — Encounter (INDEPENDENT_AMBULATORY_CARE_PROVIDER_SITE_OTHER): Payer: Self-pay

## 2013-04-20 DIAGNOSIS — R0989 Other specified symptoms and signs involving the circulatory and respiratory systems: Secondary | ICD-10-CM

## 2013-04-27 ENCOUNTER — Other Ambulatory Visit: Payer: Self-pay | Admitting: Family Medicine

## 2013-04-27 DIAGNOSIS — I255 Ischemic cardiomyopathy: Secondary | ICD-10-CM

## 2013-04-27 DIAGNOSIS — I10 Essential (primary) hypertension: Secondary | ICD-10-CM

## 2013-04-27 MED ORDER — HYDRALAZINE HCL 25 MG PO TABS: 25.0000 mg | ORAL_TABLET | Freq: Three times a day (TID) | ORAL | Status: DC

## 2013-04-27 MED ORDER — NITROGLYCERIN 0.4 MG SL SUBL: 0.4000 mg | SUBLINGUAL_TABLET | SUBLINGUAL | Status: DC | PRN

## 2013-04-28 ENCOUNTER — Telehealth: Payer: Self-pay | Admitting: Family Medicine

## 2013-04-28 NOTE — Progress Notes (Signed)
No other working numbers found in chart. Paul [name], Paul [name]

## 2013-04-28 NOTE — Telephone Encounter (Signed)
Will forward to MD. Sparrow Sanzo,CMA  

## 2013-04-28 NOTE — Telephone Encounter (Signed)
Pt stated that his heart doctor need him to take 6 pills a day (hydralizine) pills..new prescription needs to be fax to [facility].  Paul [name] stated that his phone would be on tomorrow and the number is [phone].

## 2013-05-01 NOTE — Telephone Encounter (Signed)
No trying to pass the buck but from a medical, legal point of view, since [facility] is making the change then they need to be the prescriber. I sent a refill for his previous dose ;to the pharmacy. He can simply take more pills from that Rx or get a new one from cards

## 2013-05-01 NOTE — Telephone Encounter (Signed)
LMOVM for pt to return call.   Please inform of below message from MD. Milas Gain, Maryjo [place]

## 2013-05-04 ENCOUNTER — Telehealth: Payer: Self-pay | Admitting: Family Medicine

## 2013-05-04 DIAGNOSIS — I10 Essential (primary) hypertension: Secondary | ICD-10-CM

## 2013-05-04 MED ORDER — HYDRALAZINE HCL 25 MG PO TABS: 25.0000 mg | ORAL_TABLET | Freq: Three times a day (TID) | ORAL | Status: DC

## 2013-05-04 MED ORDER — CARVEDILOL 25 MG PO TABS: 25.0000 mg | ORAL_TABLET | Freq: Two times a day (BID) | ORAL | Status: DC

## 2013-05-04 NOTE — Telephone Encounter (Signed)
Spoke to pt who stated that Dr. Antoine Poche told him to double his Hydralazine dose. Last clinic note by Dr. Antoine Poche stated pt to keep current dose of BP meds. Pts last several BPs in my office were well w/in range (SBP of 110). Attempted to contact pt to see what current dose of medication is and what BP has been at home. THis will determine what dose of hydralazine should be. Carvedilol sent to [facility].   Will have support staff attempt to contact pt again to determine which dose of hydralazine he is currently taking or has been taking during last several visits, and to call in that dose to the [facility].   Also spoke to Dr. Antoine Poche who recommended further workup for ischemic digits. Discussed current workup which has included several autoimmune studies, all of which have been nml (lupus, rheumatoid, ESR, Scleroderma, etc...) will discuss further w/ pt.   Shelly Flatten, MD Family Medicine PGY-3 05/04/2013, 2:54 PM

## 2013-05-04 NOTE — Telephone Encounter (Signed)
Pt came by stating that pharmacy have not received response on both the cavidolo which needs refill at the [facility] pharmacy and the hydracizine which needs refill at the [facility]. Pt states that he's be out of his medicine a week.

## 2013-05-05 ENCOUNTER — Telehealth: Payer: Self-pay | Admitting: *Deleted

## 2013-05-05 NOTE — Telephone Encounter (Signed)
Left message for pt that rx was sent to pharmacy.  Angeliz Settlemyre,CMA

## 2013-05-05 NOTE — Telephone Encounter (Signed)
Pt states that he was taking 25mg  when he was last seen here in clinic.  Pt also stated that he was taking it 6 times a day.  Rx is written for TID and need clarification on which to call in and directions.  Verbal order for carvedilol given to pharmacist since they never received e-rx.  Jazmin Hartsell,CMA

## 2013-05-05 NOTE — Telephone Encounter (Signed)
I called and discussed this with the primary MD and he will be addressing this.

## 2013-05-05 NOTE — Telephone Encounter (Signed)
Please call in 25mg  TID for 90 pills x6 refills 6x daily dosing is very difficult to accomplish. Unless pt adamant about 6x daily dosing would prefer he to TID w/ BP nursing check in 1 wk.

## 2013-05-05 NOTE — Telephone Encounter (Signed)
Pt called this morning requesting that we contact his PCP to confirm Hydralazine dose increase. Needs a Carvedilol refill, and requested that we contact [facility] at [facility] to authorize. Pt also asked for authorization to get prescriptions filled at [facility]. Will refer this call to [facility].

## 2013-09-15 ENCOUNTER — Ambulatory Visit: Payer: Medicare Other

## 2013-10-20 ENCOUNTER — Encounter: Payer: Self-pay | Admitting: Cardiology

## 2013-10-27 ENCOUNTER — Telehealth: Payer: Self-pay | Admitting: Family Medicine

## 2013-10-27 DIAGNOSIS — R110 Nausea: Secondary | ICD-10-CM

## 2013-10-27 DIAGNOSIS — F411 Generalized anxiety disorder: Secondary | ICD-10-CM

## 2013-10-27 DIAGNOSIS — I10 Essential (primary) hypertension: Secondary | ICD-10-CM

## 2013-10-27 DIAGNOSIS — I255 Ischemic cardiomyopathy: Secondary | ICD-10-CM

## 2013-10-27 DIAGNOSIS — E119 Type 2 diabetes mellitus without complications: Secondary | ICD-10-CM

## 2013-10-27 DIAGNOSIS — E785 Hyperlipidemia, unspecified: Secondary | ICD-10-CM

## 2013-10-27 NOTE — Telephone Encounter (Signed)
Pt called and now has medicaid and needs all his prescriptions refilled and [facility] to the [facility] [place]. jw

## 2013-10-30 MED ORDER — ONDANSETRON HCL 4 MG PO TABS: 4.0000 mg | ORAL_TABLET | Freq: Three times a day (TID) | ORAL | Status: DC | PRN

## 2013-10-30 MED ORDER — CYCLOBENZAPRINE HCL 10 MG PO TABS: 10.0000 mg | ORAL_TABLET | Freq: Two times a day (BID) | ORAL | Status: DC | PRN

## 2013-10-30 MED ORDER — CITALOPRAM HYDROBROMIDE 20 MG PO TABS: ORAL_TABLET | ORAL | Status: DC

## 2013-10-30 MED ORDER — NAPROXEN 500 MG PO TABS: 500.0000 mg | ORAL_TABLET | Freq: Two times a day (BID) | ORAL | Status: DC

## 2013-10-30 MED ORDER — AMLODIPINE BESYLATE 2.5 MG PO TABS: 2.5000 mg | ORAL_TABLET | Freq: Every day | ORAL | Status: DC

## 2013-10-30 MED ORDER — HYDROXYZINE HCL 25 MG PO TABS: 25.0000 mg | ORAL_TABLET | Freq: Three times a day (TID) | ORAL | Status: DC | PRN

## 2013-10-30 MED ORDER — NITROGLYCERIN 0.4 MG SL SUBL: 0.4000 mg | SUBLINGUAL_TABLET | SUBLINGUAL | Status: DC | PRN

## 2013-10-30 MED ORDER — CLOPIDOGREL BISULFATE 75 MG PO TABS: 75.0000 mg | ORAL_TABLET | Freq: Every day | ORAL | Status: DC

## 2013-10-30 MED ORDER — ASPIRIN 81 MG PO TBEC: 81.0000 mg | DELAYED_RELEASE_TABLET | Freq: Every day | ORAL | Status: DC

## 2013-10-30 MED ORDER — METFORMIN HCL 500 MG PO TABS: ORAL_TABLET | ORAL | Status: DC

## 2013-10-30 MED ORDER — INSULIN PEN NEEDLE 32G X 6 MM MISC: Status: DC

## 2013-10-30 MED ORDER — QUINAPRIL-HYDROCHLOROTHIAZIDE 20-25 MG PO TABS: 1.0000 | ORAL_TABLET | Freq: Every day | ORAL | Status: DC

## 2013-10-30 MED ORDER — TRAMADOL HCL 50 MG PO TABS: 50.0000 mg | ORAL_TABLET | Freq: Three times a day (TID) | ORAL | Status: DC | PRN

## 2013-10-30 MED ORDER — ATORVASTATIN CALCIUM 40 MG PO TABS: 40.0000 mg | ORAL_TABLET | Freq: Every day | ORAL | Status: DC

## 2013-10-30 MED ORDER — HYDRALAZINE HCL 25 MG PO TABS: 25.0000 mg | ORAL_TABLET | Freq: Three times a day (TID) | ORAL | Status: DC

## 2013-10-30 MED ORDER — GLUCOSE BLOOD VI STRP: ORAL_STRIP | Status: DC

## 2013-10-30 MED ORDER — CARVEDILOL 25 MG PO TABS: 25.0000 mg | ORAL_TABLET | Freq: Two times a day (BID) | ORAL | Status: DC

## 2013-10-30 MED ORDER — ISOSORBIDE MONONITRATE ER 30 MG PO TB24: 30.0000 mg | ORAL_TABLET | Freq: Every day | ORAL | Status: DC

## 2013-10-30 MED ORDER — INSULIN GLARGINE 100 UNIT/ML ~~LOC~~ SOLN: 14.0000 [IU] | Freq: Every day | SUBCUTANEOUS | Status: DC

## 2013-10-30 NOTE — Addendum Note (Signed)
Addended by: Konrad DoloresMERRELL, Johnthan Axtman J on: 10/30/2013 03:03 PM   Modules accepted: Orders

## 2013-10-30 NOTE — Telephone Encounter (Signed)
Done. Please call pt to let him know

## 2013-10-30 NOTE — Telephone Encounter (Signed)
Tried to call patient and unable to leave a message.  Please inform that medications were sent to pharmacy if he calls back. Jazmin Hartsell,CMA

## 2013-11-17 ENCOUNTER — Encounter: Payer: Self-pay | Admitting: Cardiology

## 2013-12-12 ENCOUNTER — Encounter: Payer: Self-pay | Admitting: Family Medicine

## 2013-12-12 ENCOUNTER — Ambulatory Visit (INDEPENDENT_AMBULATORY_CARE_PROVIDER_SITE_OTHER): Payer: Medicare Other | Admitting: Family Medicine

## 2013-12-12 VITALS — BP 144/98 | HR 71 | Temp 98.3°F | Wt 166.0 lb

## 2013-12-12 DIAGNOSIS — I10 Essential (primary) hypertension: Secondary | ICD-10-CM

## 2013-12-12 DIAGNOSIS — I2510 Atherosclerotic heart disease of native coronary artery without angina pectoris: Secondary | ICD-10-CM

## 2013-12-12 DIAGNOSIS — E119 Type 2 diabetes mellitus without complications: Secondary | ICD-10-CM

## 2013-12-12 NOTE — Assessment & Plan Note (Signed)
Elevated today but anxious.  No change at this time as previous readings nml

## 2013-12-12 NOTE — Patient Instructions (Signed)
You are doing well overall Please remember to check you sugar every morning.  Please restart your insulin. Do not take it if your sugar is right around 100 Please come back in 3 months or sooner if needed.

## 2013-12-12 NOTE — Assessment & Plan Note (Signed)
Pt to restart insulin Cont metformin Pt to start monitor.

## 2013-12-12 NOTE — Addendum Note (Signed)
Addended by: Henri MedalHARTSELL, Ethyl Vila M on: 12/12/2013 04:38 PM   Modules accepted: Orders

## 2013-12-12 NOTE — Assessment & Plan Note (Signed)
Cont current therapy.  Cont cards f/u and ACCELERATE trial

## 2013-12-12 NOTE — Progress Notes (Signed)
Paul HammondRoosevelt [name] is a 43 y.o. male who [facility]  Now on disability.   CAD: No further CP.   HTN: denies CP, Palpitations, SOB.   DM: Not using insulin. Not checking sugar at home. Not taking   Spasms/pain: no longer taking flexeril and tramadol  The following portions of the patient's history were reviewed and updated as appropriate: allergies, current medications, past medical history, family and social history, and problem list.  Patient is a nonsmoke.  Past Medical History  Diagnosis Date  . Hypertension   . CAD (coronary artery disease)     Anterior MI 2007 with DES stenting of an occluded LAD, distal 70% stenosis, OM 60% stenosis, circumflex 90% stenosis treated with a drug-eluting stent, PA 90% stenosis treated medically.  Can't January 2013 70-80% diagonal stenosis in a small vessel, OM 50% stenosis, PDA long 75% stenosis. EF 45%. He was managed medically.  . Diabetes mellitus   . Hyperlipidemia   . Cardiomyopathy, ischemic     EF 45%  . History of chest pain 11/25/2010  . Myocardial infarction   . Anxiety     ROS as above otherwise neg.    Medications reviewed. Current Outpatient Prescriptions  Medication Sig Dispense Refill  . amLODipine (NORVASC) 2.5 MG tablet Take 1 tablet (2.5 mg total) by mouth daily.  30 tablet  3  . aspirin 81 MG EC tablet Take 1 tablet (81 mg total) by mouth daily.  30 tablet  11  . atorvastatin (LIPITOR) 40 MG tablet Take 1 tablet (40 mg total) by mouth daily.  90 tablet  2  . carvedilol (COREG) 25 MG tablet Take 1 tablet (25 mg total) by mouth 2 (two) times daily with a meal.  60 tablet  6  . citalopram (CELEXA) 20 MG tablet Take 1 pill twice a day  180 tablet  3  . clopidogrel (PLAVIX) 75 MG tablet Take 1 tablet (75 mg total) by mouth daily.  90 tablet  3  . glucose blood (BAYER CONTOUR TEST) test strip Use as directed  100 each  11  . hydrALAZINE (APRESOLINE) 25 MG tablet Take 1 tablet (25 mg total) by mouth 3 (three)  times daily.  90 tablet  3  . hydrOXYzine (ATARAX/VISTARIL) 25 MG tablet Take 1 tablet (25 mg total) by mouth 3 (three) times daily as needed for anxiety.  30 tablet  0  . insulin glargine (LANTUS) 100 UNIT/ML injection Inject 0.14 mLs (14 Units total) into the skin daily with breakfast.  10 mL  3  . Insulin Pen Needle 32G X 6 MM MISC To be used with the Levemir flex pen  100 each  6  . isosorbide mononitrate (IMDUR) 30 MG 24 hr tablet Take 1 tablet (30 mg total) by mouth daily.  90 tablet  2  . metFORMIN (GLUCOPHAGE) 500 MG tablet Take 2 tablets in the morning. Take 1 tablet at dinner  90 tablet  6  . naproxen (NAPROSYN) 500 MG tablet Take 1 tablet (500 mg total) by mouth 2 (two) times daily with a meal.  30 tablet  0  . nitroGLYCERIN (NITROSTAT) 0.4 MG SL tablet Place 1 tablet (0.4 mg total) under the tongue every 5 (five) minutes as needed.  25 tablet  6  . ondansetron (ZOFRAN) 4 MG tablet Take 1 tablet (4 mg total) by mouth every 8 (eight) hours as needed for nausea.  30 tablet  3  . quinapril-hydrochlorothiazide (ACCURETIC) 20-25 MG per tablet  Take 1 tablet by mouth daily.  90 tablet  3   No current facility-administered medications for this visit.    Exam: Pulse 71  Temp(Src) 98.3 F (36.8 C) (Oral)  Wt 166 lb (75.297 kg) Gen: Well NAD HEENT: EOMI,  MMM   No results found for this or any previous visit (from the past 72 hour(s)).  A/P (as seen in Problem list)  No problem-specific assessment & plan notes found for this encounter.

## 2014-01-18 ENCOUNTER — Encounter: Payer: Self-pay | Admitting: Cardiology

## 2014-01-18 ENCOUNTER — Ambulatory Visit (INDEPENDENT_AMBULATORY_CARE_PROVIDER_SITE_OTHER): Payer: Medicare Other | Admitting: Cardiology

## 2014-01-18 VITALS — BP 148/100 | HR 80 | Ht 71.0 in | Wt 168.0 lb

## 2014-01-18 DIAGNOSIS — I10 Essential (primary) hypertension: Secondary | ICD-10-CM | POA: Diagnosis not present

## 2014-01-18 DIAGNOSIS — I2510 Atherosclerotic heart disease of native coronary artery without angina pectoris: Secondary | ICD-10-CM | POA: Diagnosis not present

## 2014-01-18 NOTE — Progress Notes (Signed)
HPI The patient presents for evaluation of CAD.  Since I last saw him he says he's doing OK.  He's not exercising routinely. He says he is under a fair amount of stress. He denies any ongoing chest pressure, neck or arm discomfort. He has had no palpitations, presyncope or syncope. He denies any new PND or orthopnea. He has had no weight gain or edema.  He has had healing of ischemic digits.    Allergies  Allergen Reactions  . Shrimp [Shellfish Allergy] Swelling    Current Outpatient Prescriptions  Medication Sig Dispense Refill  . amLODipine (NORVASC) 2.5 MG tablet Take 1 tablet (2.5 mg total) by mouth daily.  30 tablet  3  . aspirin 81 MG EC tablet Take 1 tablet (81 mg total) by mouth daily.  30 tablet  11  . atorvastatin (LIPITOR) 40 MG tablet Take 1 tablet (40 mg total) by mouth daily.  90 tablet  2  . carvedilol (COREG) 25 MG tablet Take 1 tablet (25 mg total) by mouth 2 (two) times daily with a meal.  60 tablet  6  . citalopram (CELEXA) 20 MG tablet Take 1 pill twice a day  180 tablet  3  . clopidogrel (PLAVIX) 75 MG tablet Take 1 tablet (75 mg total) by mouth daily.  90 tablet  3  . glucose blood (BAYER CONTOUR TEST) test strip Use as directed  100 each  11  . hydrALAZINE (APRESOLINE) 25 MG tablet Take 1 tablet (25 mg total) by mouth 3 (three) times daily.  90 tablet  3  . hydrOXYzine (ATARAX/VISTARIL) 25 MG tablet Take 1 tablet (25 mg total) by mouth 3 (three) times daily as needed for anxiety.  30 tablet  0  . insulin glargine (LANTUS) 100 UNIT/ML injection Inject 0.14 mLs (14 Units total) into the skin daily with breakfast.  10 mL  3  . Insulin Pen Needle 32G X 6 MM MISC To be used with the Levemir flex pen  100 each  6  . isosorbide mononitrate (IMDUR) 30 MG 24 hr tablet Take 1 tablet (30 mg total) by mouth daily.  90 tablet  2  . metFORMIN (GLUCOPHAGE) 500 MG tablet Take 2 tablets in the morning. Take 1 tablet at dinner  90 tablet  6  . nitroGLYCERIN (NITROSTAT) 0.4 MG SL  tablet Place 1 tablet (0.4 mg total) under the tongue every 5 (five) minutes as needed.  25 tablet  6  . ondansetron (ZOFRAN) 4 MG tablet Take 1 tablet (4 mg total) by mouth every 8 (eight) hours as needed for nausea.  30 tablet  3  . quinapril-hydrochlorothiazide (ACCURETIC) 20-25 MG per tablet Take 1 tablet by mouth daily.  90 tablet  3   No current facility-administered medications for this visit.    Past Medical History  Diagnosis Date  . Hypertension   . CAD (coronary artery disease)     Anterior MI 2007 with DES stenting of an occluded LAD, distal 70% stenosis, OM 60% stenosis, circumflex 90% stenosis treated with a drug-eluting stent, PA 90% stenosis treated medically.  Can't January 2013 70-80% diagonal stenosis in a small vessel, OM 50% stenosis, PDA long 75% stenosis. EF 45%. He was managed medically.  . Diabetes mellitus   . Hyperlipidemia   . Cardiomyopathy, ischemic     EF 45%  . History of chest pain 11/25/2010  . Myocardial infarction   . Anxiety     Past Surgical History  Procedure Laterality Date  .  Catherization  08/25/11    Dr. Antoine Poche  . Coronary angioplasty with stent placement  207, 2011    4 stents placed    ROS:  As stated in the HPI and negative for all other systems.  PHYSICAL EXAM BP 148/100  Pulse 80  Ht 5\' 11"  (1.803 m)  Wt 168 lb (76.204 kg)  BMI 23.44 kg/m2 GENERAL:  Well appearing HEENT:  Pupils equal round and reactive, fundi not visualized, oral mucosa unremarkable NECK:  No jugular venous distention, waveform within normal limits, carotid upstroke brisk and symmetric, no bruits, no thyromegaly LYMPHATICS:  No cervical, inguinal adenopathy LUNGS:  Clear to auscultation bilaterally BACK:  No CVA tenderness CHEST:  Unremarkable HEART:  PMI not displaced or sustained,S1 and S2 within normal limits, no S3, no S4, no clicks, no rubs, no murmurs ABD:  Flat, positive bowel sounds normal in frequency in pitch, no bruits, no rebound, no guarding,  no midline pulsatile mass, no hepatomegaly, no splenomegaly EXT:  2 plus pulses throughout, no edema, no cyanosis no clubbing, he has a healing ulceration on the tip of his left index finger. This finger is slightly cooler to the touch with decrease in LVAD perfusion. He otherwise has excellent radial pulses and normal nailbed blanching and all his other fingers. SKIN:  No rashes no nodules NEURO:  Cranial nerves II through XII grossly intact, motor grossly intact throughout PSYCH:  Cognitively intact, oriented to person place and time   ASSESSMENT AND PLAN  Finger ulcer:  The etiology of this was not clear.  I did some screening for connective tissue diseases he had no overt findings. I will defer further workup to MERRELL, DAVID, MD  CAD (coronary artery disease) -  A he has had no new symptoms. He is tolerating medications.  We will continue with risk reduction.   Cardiomyopathy, ischemic -  The patient seems to be euvolemic. He's on a good medical regimen. No change in therapy is indicated. Given his previously mildly reduced ejection fraction I will check an echocardiogram.  Hypertension -  The blood pressure is at target. No change in medications is indicated. We will continue with therapeutic lifestyle changes (TLC).  Hyperlipidemia -  This is being followed in the ACCELERATE trial

## 2014-01-18 NOTE — Patient Instructions (Addendum)
The current medical regimen is effective;  continue present plan and medications.  Follow up in 1 year with Dr Hochrein.  You will receive a letter in the mail 2 months before you are due.  Please call us when you receive this letter to schedule your follow up appointment.  

## 2014-01-19 ENCOUNTER — Telehealth: Payer: Self-pay | Admitting: Cardiology

## 2014-01-19 NOTE — Telephone Encounter (Signed)
noted 

## 2014-01-19 NOTE — Telephone Encounter (Signed)
New message     FYI Want dr to know he found a his imdur and will not need a presc called in

## 2014-02-26 ENCOUNTER — Encounter: Payer: Self-pay | Admitting: Family Medicine

## 2014-02-26 NOTE — Progress Notes (Signed)
Placed in MDs box. Fleeger, [name]  

## 2014-02-26 NOTE — Progress Notes (Signed)
Pt came in stating that he has jury duty need completed within 10 days.

## 2014-02-28 ENCOUNTER — Other Ambulatory Visit: Payer: Self-pay | Admitting: *Deleted

## 2014-02-28 MED ORDER — CARVEDILOL 25 MG PO TABS: 25.0000 mg | ORAL_TABLET | Freq: Two times a day (BID) | ORAL | Status: DC

## 2014-03-01 NOTE — Progress Notes (Signed)
LM for patient to call back.  Please give message from MD when he does.  Thanks Limited BrandsJazmin Lacinda Curvin,CMA

## 2014-03-01 NOTE — Progress Notes (Signed)
Can you call the patient and let them know they need a clinic appt before the paperwork will be filled out (no identifiable condition that would limit jury duty per most recent clinic visits in May 2015 and also in 2014). -Dr Waynetta SandyWight

## 2014-03-02 ENCOUNTER — Ambulatory Visit (INDEPENDENT_AMBULATORY_CARE_PROVIDER_SITE_OTHER): Payer: Medicare Other | Admitting: Family Medicine

## 2014-03-02 ENCOUNTER — Encounter: Payer: Self-pay | Admitting: Family Medicine

## 2014-03-02 VITALS — BP 130/88 | Temp 98.6°F | Wt 169.0 lb

## 2014-03-02 DIAGNOSIS — E119 Type 2 diabetes mellitus without complications: Secondary | ICD-10-CM | POA: Diagnosis not present

## 2014-03-02 DIAGNOSIS — I10 Essential (primary) hypertension: Secondary | ICD-10-CM

## 2014-03-02 DIAGNOSIS — F411 Generalized anxiety disorder: Secondary | ICD-10-CM

## 2014-03-02 DIAGNOSIS — I2510 Atherosclerotic heart disease of native coronary artery without angina pectoris: Secondary | ICD-10-CM

## 2014-03-02 LAB — POCT GLYCOSYLATED HEMOGLOBIN (HGB A1C): Hemoglobin A1C: 11.2

## 2014-03-02 NOTE — Progress Notes (Signed)
Letter given at office visit.

## 2014-03-02 NOTE — Assessment & Plan Note (Signed)
A1c significantly increased today compared to last one done in 12/2012: 7.3>>11.2 P: continue lantus and metformin, will need to have him start bringing in daily log to titrate lantus at next visit.

## 2014-03-02 NOTE — Assessment & Plan Note (Signed)
At goal, stable P: continue current regimen

## 2014-03-02 NOTE — Progress Notes (Signed)
Patient ID: Sandrea HammondRoosevelt Butler, male   DOB: 19-Aug-1970, 43 y.o.   MRN: [id number]   Subjective:    Patient ID: Sandrea HammondRoosevelt Theilen, male    DOB: 19-Aug-1970, 43 y.o.   MRN: [id number]  HPI  CC: f/u  # Generalized anxiety:  Undergoing a lot of stress currently: in courts for child support, in process of getting disability, currently living out of his car with no money  Requesting letter for jury duty  PHQ9: 1; somewhat difficult (1 for item #2)  GAD7: 7 (0,0,3,0,1,3,0)  Denies SI/HI ROS: No palpitations, no CP, no headaches, no dizziness  # CAD:  Followed by cardiology  S/p stents in 2007 and 2011 ROS: no CP, no SOB, no leg swelling  # Diabetes:  On metformin  Insulin: lantus 14 units daily ROS: no CP, no SOB, no polyuria, no dysuria, no changes in vision  Review of Systems   See HPI for ROS. Objective:  BP 130/88  Temp(Src) 98.6 F (37 C) (Oral)  Wt 169 lb (76.658 kg)  General: NAD CV: RRR, normal s1/s2, no murmur. 2+ radial and PT pulses Resp: CTAB, normal effort Ext: no edema or cyanosis, wwp. Neuro: alert and oriented, no focal deficits Psych: mood: "normal", affect congruent     Assessment & Plan:  See Problem List Documentation

## 2014-03-02 NOTE — Assessment & Plan Note (Signed)
Stable.  P: continue current regimen

## 2014-03-02 NOTE — Assessment & Plan Note (Addendum)
Pt has celexa and buspar (brings in bottles today). Currently under additional stress and anxiety given social stressors (child support, in courts, living out of car). PHQ9 score 1, GAD7 score 7 (just below "probable", however has this diagnosed in the past) P: continue celexa, buspar for breakthrough. Letter given for jury duty excuse.

## 2014-03-15 ENCOUNTER — Other Ambulatory Visit: Payer: Self-pay | Admitting: *Deleted

## 2014-03-15 DIAGNOSIS — I255 Ischemic cardiomyopathy: Secondary | ICD-10-CM

## 2014-03-16 MED ORDER — NITROGLYCERIN 0.4 MG SL SUBL: 0.4000 mg | SUBLINGUAL_TABLET | SUBLINGUAL | Status: DC | PRN

## 2014-03-29 ENCOUNTER — Telehealth: Payer: Self-pay | Admitting: Family Medicine

## 2014-03-29 NOTE — Telephone Encounter (Signed)
Needs refill on lantus solostar pen. Wants it sent to [facility] at [address]

## 2014-03-30 ENCOUNTER — Other Ambulatory Visit: Payer: Self-pay | Admitting: Family Medicine

## 2014-03-30 DIAGNOSIS — E119 Type 2 diabetes mellitus without complications: Secondary | ICD-10-CM

## 2014-04-02 ENCOUNTER — Other Ambulatory Visit: Payer: Self-pay | Admitting: *Deleted

## 2014-04-02 MED ORDER — BUSPIRONE HCL 7.5 MG PO TABS: 7.5000 mg | ORAL_TABLET | Freq: Every day | ORAL | Status: DC

## 2014-04-04 MED ORDER — INSULIN GLARGINE 100 UNITS/ML SOLOSTAR PEN: 14.0000 [IU] | PEN_INJECTOR | SUBCUTANEOUS | Status: DC

## 2014-04-04 MED ORDER — INSULIN GLARGINE 100 UNITS/ML SOLOSTAR PEN: 14.0000 [IU] | PEN_INJECTOR | Freq: Every day | SUBCUTANEOUS | Status: DC

## 2014-04-09 ENCOUNTER — Other Ambulatory Visit: Payer: Self-pay | Admitting: *Deleted

## 2014-04-09 MED ORDER — BUSPIRONE HCL 7.5 MG PO TABS: 7.5000 mg | ORAL_TABLET | Freq: Every day | ORAL | Status: DC

## 2014-04-23 ENCOUNTER — Telehealth: Payer: Self-pay | Admitting: Family Medicine

## 2014-04-23 DIAGNOSIS — I10 Essential (primary) hypertension: Secondary | ICD-10-CM

## 2014-04-23 DIAGNOSIS — F411 Generalized anxiety disorder: Secondary | ICD-10-CM

## 2014-04-23 DIAGNOSIS — E785 Hyperlipidemia, unspecified: Secondary | ICD-10-CM

## 2014-04-23 MED ORDER — ATORVASTATIN CALCIUM 40 MG PO TABS: 40.0000 mg | ORAL_TABLET | Freq: Every day | ORAL | Status: DC

## 2014-04-23 MED ORDER — HYDROXYZINE HCL 25 MG PO TABS: 25.0000 mg | ORAL_TABLET | Freq: Three times a day (TID) | ORAL | Status: DC | PRN

## 2014-04-23 MED ORDER — CARVEDILOL 25 MG PO TABS: 25.0000 mg | ORAL_TABLET | Freq: Two times a day (BID) | ORAL | Status: DC

## 2014-04-23 NOTE — Telephone Encounter (Signed)
Prescriptions sent for 90 day supply. -Dr. Waynetta Sandy

## 2014-04-23 NOTE — Telephone Encounter (Signed)
Pt called because now that he has insurance all his medications need to be in 90 day supply for his insurance to cover them The only one that need to be fixed are the Atorvastatin, Carvedilol, and hydroxyzine. Can we fix these today. jw

## 2014-04-25 ENCOUNTER — Encounter: Payer: Self-pay | Admitting: Cardiology

## 2014-04-26 NOTE — Telephone Encounter (Signed)
Pt also need 90 day supplies of hydrALAZINE & amlodipine.

## 2014-04-27 MED ORDER — AMLODIPINE BESYLATE 2.5 MG PO TABS: 2.5000 mg | ORAL_TABLET | Freq: Every day | ORAL | Status: DC

## 2014-04-27 MED ORDER — HYDRALAZINE HCL 25 MG PO TABS: 25.0000 mg | ORAL_TABLET | Freq: Three times a day (TID) | ORAL | Status: DC

## 2014-04-27 NOTE — Telephone Encounter (Signed)
Rx sent. -Dr. Tavares Levinson 

## 2014-04-27 NOTE — Addendum Note (Signed)
Addended by: Nani Ravens on: 04/27/2014 09:12 AM   Modules accepted: Orders

## 2014-05-17 ENCOUNTER — Other Ambulatory Visit: Payer: Self-pay | Admitting: Family Medicine

## 2014-05-17 MED ORDER — METFORMIN HCL 500 MG PO TABS: ORAL_TABLET | ORAL | Status: DC

## 2014-05-17 MED ORDER — CLOPIDOGREL BISULFATE 75 MG PO TABS: 75.0000 mg | ORAL_TABLET | Freq: Every day | ORAL | Status: DC

## 2014-05-17 NOTE — Telephone Encounter (Signed)
Needs 90 day refill on Plavix

## 2014-05-17 NOTE — Telephone Encounter (Signed)
Will forward to MD.  Please refill triamcinolone. Jazmin Hartsell,CMA

## 2014-05-17 NOTE — Telephone Encounter (Signed)
Also needs metformin and a big bottle of triramcinolone cream

## 2014-07-19 ENCOUNTER — Encounter (HOSPITAL_COMMUNITY): Payer: Self-pay | Admitting: Cardiology

## 2014-07-23 ENCOUNTER — Encounter: Payer: Self-pay | Admitting: Cardiology

## 2014-08-29 ENCOUNTER — Encounter: Payer: Self-pay | Admitting: Cardiology

## 2014-10-25 ENCOUNTER — Other Ambulatory Visit: Payer: Self-pay | Admitting: Family Medicine

## 2014-10-25 DIAGNOSIS — I10 Essential (primary) hypertension: Secondary | ICD-10-CM

## 2014-10-25 DIAGNOSIS — L853 Xerosis cutis: Secondary | ICD-10-CM

## 2014-10-25 NOTE — Telephone Encounter (Signed)
Pt called because he said his pharmacy is faxing us for a refill on his medication and since Monday of this week and we have not responded. Not sure which medication he was talking about. I asked him 4 times and never could understand and also ask for him to spell this but he couldn't . jw

## 2014-10-25 NOTE — Telephone Encounter (Signed)
Left voice message for pt to return nurse call regarding medication refills.  Reviewed pt's chart; I have not received anything from pt's pharmacy for medication refills.  Clovis PuMartin, Tamika L, RN

## 2014-10-25 NOTE — Telephone Encounter (Signed)
Will forward to provider and tamika, rn to see if she has seen anything on this patient. Paul [name],CMA

## 2014-11-09 MED ORDER — AMLODIPINE BESYLATE 2.5 MG PO TABS: 2.5000 mg | ORAL_TABLET | Freq: Every day | ORAL | Status: DC

## 2014-11-09 MED ORDER — ISOSORBIDE MONONITRATE ER 30 MG PO TB24: 30.0000 mg | ORAL_TABLET | Freq: Every day | ORAL | Status: DC

## 2014-11-09 MED ORDER — TRIAMCINOLONE ACETONIDE 0.1 % EX CREA: TOPICAL_CREAM | Freq: Two times a day (BID) | CUTANEOUS | Status: AC

## 2014-11-09 NOTE — Telephone Encounter (Signed)
Pt in nurse clinic today to request refill on medications.  Pt is out of medication.  Clovis PuMartin, Tamika L, RN

## 2014-11-09 NOTE — Addendum Note (Signed)
Addended by: Clovis PuMARTIN, TAMIKA L on: 11/09/2014 02:18 PM   Modules accepted: Orders

## 2014-12-14 ENCOUNTER — Other Ambulatory Visit: Payer: Self-pay | Admitting: *Deleted

## 2014-12-14 ENCOUNTER — Encounter: Payer: Self-pay | Admitting: Family Medicine

## 2014-12-14 DIAGNOSIS — I10 Essential (primary) hypertension: Secondary | ICD-10-CM

## 2014-12-14 DIAGNOSIS — L853 Xerosis cutis: Secondary | ICD-10-CM

## 2014-12-14 DIAGNOSIS — R110 Nausea: Secondary | ICD-10-CM

## 2014-12-14 DIAGNOSIS — F411 Generalized anxiety disorder: Secondary | ICD-10-CM

## 2014-12-14 NOTE — Progress Notes (Signed)
Patient contacted pharmacy to get medications refilled and they said he needed to come by here because no refills are left.  He needs Citalopram,Quinapril,Ondansetron,and Amlodipine.  [facility] [address] [place].  He also says that Lipitor is making him sick and wants to get Symbicor instead.  He will be out of town today and wants to be able to get medication next week.  His phone number is [phone].

## 2014-12-17 ENCOUNTER — Other Ambulatory Visit: Payer: Self-pay | Admitting: Family Medicine

## 2014-12-17 MED ORDER — AMLODIPINE BESYLATE 2.5 MG PO TABS: 2.5000 mg | ORAL_TABLET | Freq: Every day | ORAL | Status: DC

## 2014-12-17 MED ORDER — CITALOPRAM HYDROBROMIDE 20 MG PO TABS: ORAL_TABLET | ORAL | Status: DC

## 2014-12-17 MED ORDER — ONDANSETRON HCL 4 MG PO TABS: 4.0000 mg | ORAL_TABLET | Freq: Three times a day (TID) | ORAL | Status: DC | PRN

## 2014-12-17 MED ORDER — ROSUVASTATIN CALCIUM 20 MG PO TABS: 20.0000 mg | ORAL_TABLET | Freq: Every day | ORAL | Status: DC

## 2014-12-17 MED ORDER — QUINAPRIL-HYDROCHLOROTHIAZIDE 20-25 MG PO TABS: 1.0000 | ORAL_TABLET | Freq: Every day | ORAL | Status: DC

## 2014-12-17 NOTE — Progress Notes (Signed)
LMOVM stating that meds listed below were sent to pharmacy and advised pt to call back to clarify other med. Brinley Rosete, CMA.

## 2014-12-17 NOTE — Progress Notes (Signed)
Rx sent in for citalopram, quinapril, amlodipine, zofran. Symbicort is an inhaler, not sure if he is referring to Simvastatin. Because he has diabetes it is recommended he have a high intensity statin, of which the other one we can try is crestor so rx of that was sent in. Please call and let him know rx were sent in, thanks. -Dr. Waynetta SandyWight

## 2015-01-09 ENCOUNTER — Ambulatory Visit (INDEPENDENT_AMBULATORY_CARE_PROVIDER_SITE_OTHER): Payer: Medicare Other | Admitting: Family Medicine

## 2015-01-09 VITALS — BP 122/80 | HR 80 | Temp 98.2°F | Ht 71.0 in | Wt 169.8 lb

## 2015-01-09 DIAGNOSIS — E1165 Type 2 diabetes mellitus with hyperglycemia: Secondary | ICD-10-CM | POA: Diagnosis not present

## 2015-01-09 DIAGNOSIS — E785 Hyperlipidemia, unspecified: Secondary | ICD-10-CM

## 2015-01-09 DIAGNOSIS — Z23 Encounter for immunization: Secondary | ICD-10-CM

## 2015-01-09 DIAGNOSIS — R1115 Cyclical vomiting syndrome unrelated to migraine: Secondary | ICD-10-CM

## 2015-01-09 DIAGNOSIS — G43A0 Cyclical vomiting, not intractable: Secondary | ICD-10-CM

## 2015-01-09 DIAGNOSIS — Z7189 Other specified counseling: Secondary | ICD-10-CM | POA: Diagnosis not present

## 2015-01-09 DIAGNOSIS — IMO0002 Reserved for concepts with insufficient information to code with codable children: Secondary | ICD-10-CM

## 2015-01-09 DIAGNOSIS — I10 Essential (primary) hypertension: Secondary | ICD-10-CM | POA: Diagnosis not present

## 2015-01-09 DIAGNOSIS — E119 Type 2 diabetes mellitus without complications: Secondary | ICD-10-CM

## 2015-01-09 DIAGNOSIS — R1110 Vomiting, unspecified: Secondary | ICD-10-CM | POA: Insufficient documentation

## 2015-01-09 LAB — POCT GLYCOSYLATED HEMOGLOBIN (HGB A1C): Hemoglobin A1C: 12.8

## 2015-01-09 MED ORDER — ONETOUCH ULTRA 2 W/DEVICE KIT: PACK | Status: DC

## 2015-01-09 MED ORDER — ONETOUCH LANCETS MISC: Status: AC

## 2015-01-09 MED ORDER — CARVEDILOL 25 MG PO TABS: 25.0000 mg | ORAL_TABLET | Freq: Two times a day (BID) | ORAL | Status: DC

## 2015-01-09 MED ORDER — GLUCOSE BLOOD VI STRP: ORAL_STRIP | Status: DC

## 2015-01-09 MED ORDER — PNEUMOCOCCAL VAC POLYVALENT 25 MCG/0.5ML IJ INJ: 0.5000 mL | INJECTION | INTRAMUSCULAR | Status: DC

## 2015-01-09 MED ORDER — HYDRALAZINE HCL 25 MG PO TABS: 25.0000 mg | ORAL_TABLET | Freq: Three times a day (TID) | ORAL | Status: DC

## 2015-01-09 NOTE — Assessment & Plan Note (Signed)
Pt blames statin, I am doubtful as it is not persistent. Did not discuss explicit drug use but this may be a cause as it truly does sound like it is cyclical every 2 weeks per his report. No red flags. F/u as needed if not improved.

## 2015-01-09 NOTE — Assessment & Plan Note (Signed)
A1c slightly worsened compared to 6 months ago, 11.2 to 12.8 today. Pt on 14 units lantus. He is out of his current meter and strips so has not been checking regularly. He declines increasing his lantus today and believes he can change his diet. I told him he basically needs to cut out carbs and sugars from his diet and his reaction was laughing, but says he will work on it. Rx sent in for new meter, strips, lancets. Instructed patient to record sugars each morning and if persistently above 150 to call clinic and we will instruct him to increase his lantus. Referral for diabetic eye exam made. F/u 3 months.

## 2015-01-09 NOTE — Progress Notes (Signed)
   Subjective:    Patient ID: Paul HammondRoosevelt [name], male    DOB: Jun 17, 1971, 44 y.o.   MRN: [id number]  HPI  CC: diabetes follow up  # Diabetes:  On Metformin  24 hour diet recall: spaghetti, 1 banana. 2 sandwiches for breakfast/lunch, onion soup 2-4 egg rolls.   CBG monitoring: AM, normally 180  No hypoglycemic  No eye doctor recently  Lipid check at outside facility ROS: no changes, no CP, SOB, no swelling  # Vomiting  Thinks it is the lipitor  Wants to go back to Simvastatin  Episode is about 1-3 vomiting episodes in a day every couple weeks.  No blood in vomit  # HTN:  Amlodipine, imdur, hydralazine, quinapril-hctz, coreg  Reports no missed doses and taking as prescribed  Review of Systems   See HPI for ROS. All other systems reviewed and are negative.  Past medical history, surgical, family, and social history reviewed and updated in the EMR as appropriate. Objective:  BP 122/80 mmHg  Pulse 80  Temp(Src) 98.2 F (36.8 C) (Oral)  Ht 5\' 11"  (1.803 m)  Wt 169 lb 12.8 oz (77.021 kg)  BMI 23.69 kg/m2 Vitals and nursing note reviewed  General: NAD Eyes: PERRL, EOMI. ENTM: MMM, no oropharyngeal lesions CV: RRR, nl s1s2 no mrg. 2+ radial pulses bl Resp: CTAB, nl effort Neuro: alert and oriented, no deficits noted. Diabetic foot exam with some decreased sensation lateral right side bottom of foot Psych: mood normal, thought content normal.  Assessment & Plan:  See Problem List Documentation

## 2015-01-09 NOTE — Assessment & Plan Note (Signed)
Pt concerned lipitor (not sure where he was taking this, most recent rx I sent in was crestor which he said he didn't pick up yet) is causing vomiting. Pt reports will pick up crestor rx and if still vomiting will call clinic. F/u 3 months.

## 2015-01-09 NOTE — Patient Instructions (Addendum)
Once you get your new glucometer: Record your sugar at least in the morning, goal is around 140-150. Below is okay as long as it is not as low as 80. If your sugars are not at that goal, call the office around July 1st.   You goal is to cut all carbohydrates, all sugars! No sodas no juices no added sugar to your coffee. Fats are okay. No Low fat foods.  If your A1c is not improved in 3 months we will start additional medication.    Diet Recommendations for Diabetes   Starchy (carb) foods include: Bread, rice, pasta, potatoes, corn, crackers, bagels, muffins, all baked goods.  (Fruits, milk, and yogurt also have carbohydrate, but most of these foods will not spike your blood sugar as the starchy foods will.)  A few fruits do cause high blood sugars; use small portions of bananas (limit to 1/2 at a time), grapes, and tropical fruits.    Protein foods include: Meat, fish, poultry, eggs, dairy foods, and beans such as pinto and kidney beans (beans also provide carbohydrate).   1. Eat at least 3 meals and 1-2 snacks per day. Never go more than 4-5 hours while awake without eating.  2. Limit starchy foods to TWO per meal and ONE per snack. ONE portion of a starchy  food is equal to the following:   - ONE slice of bread (or its equivalent, such as half of a hamburger bun).   - 1/2 cup of a "scoopable" starchy food such as potatoes or rice.   - 15 grams of carbohydrate as shown on food label.  3. Both lunch and dinner should include a protein food, a carb food, and vegetables.   - Obtain twice as many veg's as protein or carbohydrate foods for both lunch and dinner.   - Fresh or frozen veg's are best.   - Try to keep frozen veg's on hand for a quick vegetable serving.    4. Breakfast should always include protein.

## 2015-01-09 NOTE — Addendum Note (Signed)
Addended by: Nani RavensWIGHT, Claritza July M on: 01/09/2015 10:40 PM   Modules accepted: Kipp BroodSmartSet

## 2015-01-16 NOTE — Addendum Note (Signed)
Addended by: Gilberto BetterSIMPSON, Kimothy Kishimoto R on: 01/16/2015 10:48 AM   Modules accepted: Orders, SmartSet

## 2015-01-16 NOTE — Addendum Note (Signed)
Addended by: Gilberto BetterSIMPSON, MICHELLE R on: 01/16/2015 09:16 AM   Modules accepted: Kipp BroodSmartSet

## 2015-01-18 ENCOUNTER — Encounter: Payer: Self-pay | Admitting: Cardiology

## 2015-01-18 ENCOUNTER — Ambulatory Visit (INDEPENDENT_AMBULATORY_CARE_PROVIDER_SITE_OTHER): Payer: Medicare Other | Admitting: Cardiology

## 2015-01-18 VITALS — BP 152/110 | HR 70 | Ht 71.0 in | Wt 172.0 lb

## 2015-01-18 DIAGNOSIS — I2510 Atherosclerotic heart disease of native coronary artery without angina pectoris: Secondary | ICD-10-CM

## 2015-01-18 DIAGNOSIS — E785 Hyperlipidemia, unspecified: Secondary | ICD-10-CM | POA: Diagnosis not present

## 2015-01-18 MED ORDER — ROSUVASTATIN CALCIUM 20 MG PO TABS: 20.0000 mg | ORAL_TABLET | Freq: Every day | ORAL | Status: DC

## 2015-01-18 MED ORDER — AMLODIPINE BESYLATE 5 MG PO TABS: 5.0000 mg | ORAL_TABLET | Freq: Every day | ORAL | Status: DC

## 2015-01-18 NOTE — Progress Notes (Signed)
HPI The patient presents for evaluation of CAD.  Since I last saw him he says he's doing OK.  He's not exercising routinely. He does do some walkoing He denies any ongoing chest pressure, neck or arm discomfort. He has had no palpitations, presyncope or syncope. He denies any new PND or orthopnea. He has had no weight gain or edema.  He couldn't tolerate Lipitor because of stomach upset. Right now he's been unable to afford Crestor. His A1c was greater than 12. However, he claims that he is compliant with his meds.  Allergies  Allergen Reactions  . Shrimp [Shellfish Allergy] Swelling    Current Outpatient Prescriptions  Medication Sig Dispense Refill  . amLODipine (NORVASC) 2.5 MG tablet Take 1 tablet (2.5 mg total) by mouth daily. 90 tablet 3  . aspirin 81 MG EC tablet Take 1 tablet (81 mg total) by mouth daily. 30 tablet 11  . Blood Glucose Monitoring Suppl (ONE TOUCH ULTRA 2) W/DEVICE KIT Check sugar once daily. ICD10 E11.65 1 each 0  . busPIRone (BUSPAR) 7.5 MG tablet Take 1 tablet (7.5 mg total) by mouth daily. 90 tablet 3  . carvedilol (COREG) 25 MG tablet Take 1 tablet (25 mg total) by mouth 2 (two) times daily with a meal. 180 tablet 2  . citalopram (CELEXA) 20 MG tablet Take 1 pill twice a day 180 tablet 3  . clopidogrel (PLAVIX) 75 MG tablet Take 1 tablet (75 mg total) by mouth daily. 90 tablet 3  . glucose blood (ONE TOUCH ULTRA TEST) test strip Check sugar once daily in the morning. ICD10 E11.65 30 each 5  . hydrALAZINE (APRESOLINE) 25 MG tablet Take 1 tablet (25 mg total) by mouth 3 (three) times daily. 270 tablet 2  . hydrOXYzine (ATARAX/VISTARIL) 25 MG tablet Take 1 tablet (25 mg total) by mouth 3 (three) times daily as needed for anxiety. 90 tablet 0  . insulin glargine (LANTUS) 100 unit/mL SOPN Inject 0.14 mLs (14 Units total) into the skin at bedtime. 15 mL 4  . Insulin Pen Needle 32G X 6 MM MISC To be used with the Levemir flex pen 100 each 6  . isosorbide mononitrate  (IMDUR) 30 MG 24 hr tablet Take 1 tablet (30 mg total) by mouth daily. 90 tablet 3  . metFORMIN (GLUCOPHAGE) 500 MG tablet Take 2 tablets in the morning. Take 1 tablet at dinner 90 tablet 6  . nitroGLYCERIN (NITROSTAT) 0.4 MG SL tablet Place 1 tablet (0.4 mg total) under the tongue every 5 (five) minutes as needed. 25 tablet 11  . ondansetron (ZOFRAN) 4 MG tablet Take 1 tablet (4 mg total) by mouth every 8 (eight) hours as needed for nausea. 30 tablet 1  . ONE TOUCH LANCETS MISC Use to check sugar once daily. ICD10 E11.65 30 each 5  . quinapril-hydrochlorothiazide (ACCURETIC) 20-25 MG per tablet Take 1 tablet by mouth daily. 90 tablet 3  . rosuvastatin (CRESTOR) 20 MG tablet Take 1 tablet (20 mg total) by mouth daily. 30 tablet 1  . triamcinolone cream (KENALOG) 0.1 % Apply topically 2 (two) times daily. 2268 g 1   No current facility-administered medications for this visit.    Past Medical History  Diagnosis Date  . Hypertension   . CAD (coronary artery disease)     Anterior MI 2007 with DES stenting of an occluded LAD, distal 70% stenosis, OM 60% stenosis, circumflex 90% stenosis treated with a drug-eluting stent, PA 90% stenosis treated medically.  Can't January 2013  70-80% diagonal stenosis in a small vessel, OM 50% stenosis, PDA long 75% stenosis. EF 45%. He was managed medically.  . Diabetes mellitus   . Hyperlipidemia   . Cardiomyopathy, ischemic     EF 45%  . History of chest pain 11/25/2010  . Myocardial infarction   . Anxiety     Past Surgical History  Procedure Laterality Date  . Catherization  08/25/11    Dr. Percival Spanish  . Coronary angioplasty with stent placement  207, 2011    4 stents placed  . Left heart catheterization with coronary angiogram N/A 08/25/2011    Procedure: LEFT HEART CATHETERIZATION WITH CORONARY ANGIOGRAM;  Surgeon: Minus Breeding, MD;  Location: [facility] CATH LAB;  Service: Cardiovascular;  Laterality: N/A;    ROS:  As stated in the HPI and negative for all  other systems.  PHYSICAL EXAM BP 152/110 mmHg  Pulse 70  Ht 5' 11"  (1.803 m)  Wt 172 lb (78.019 kg)  BMI 24.00 kg/m2 GENERAL:  Well appearing HEENT:  Pupils equal round and reactive, fundi not visualized, oral mucosa unremarkable NECK:  No jugular venous distention, waveform within normal limits, carotid upstroke brisk and symmetric, no bruits, no thyromegaly LUNGS:  Clear to auscultation bilaterally CHEST:  Unremarkable HEART:  PMI not displaced or sustained,S1 and S2 within normal limits, no S3, no S4, no clicks, no rubs, no murmurs ABD:  Flat, positive bowel sounds normal in frequency in pitch, no bruits, no rebound, no guarding, no midline pulsatile mass, no hepatomegaly, no splenomegaly EXT:  2 plus pulses throughout, no edema, no cyanosis no clubbing. SKIN:  No rashes no nodules NEURO:  Cranial nerves II through XII grossly intact, motor grossly intact throughout PSYCH:  Cognitively intact, oriented to person place and time  EKG: Rate 70, normal sinus rhythm, old anteroseptal infarct, nonspecific T-wave flattening, lateral T-wave inversions consistent with ischemia but unchanged from previous..  01/18/2015  ASSESSMENT AND PLAN  CAD (coronary artery disease) -  Has had no new symptoms. He is tolerating medications.  We will continue with risk reduction.   I'm afraid that he has not been on it with lifestyle and probably not medications weekly he has been.  Cardiomyopathy, ischemic -  The patient seems to be euvolemic. He's on a good medical regimen. No change in therapy is his EF was mildly reduced but better than previous one.     Hyperlipidemia -  He was in the ACCELERATE trial.  Now he has been on Crestor but he says he hasn't reported this.    DM - I reviewed with him how severely out of control his diabetes is. I am wondering how good he is with compliance.   His last A1c was 12.8 and I reviewed this.

## 2015-01-18 NOTE — Patient Instructions (Signed)
Your physician wants you to follow-up in: 1 Year You will receive a reminder letter in the mail two months in advance. If you don't receive a letter, please call our office to schedule the follow-up appointment.  Your physician recommends that you schedule a follow-up appointment in: 2 Months with APP  Your physician has recommended you make the following change in your medication: Increase Amlodipine to 5 mg daily

## 2015-01-23 ENCOUNTER — Other Ambulatory Visit: Payer: Self-pay | Admitting: Family Medicine

## 2015-01-23 DIAGNOSIS — R110 Nausea: Secondary | ICD-10-CM

## 2015-01-23 DIAGNOSIS — E119 Type 2 diabetes mellitus without complications: Secondary | ICD-10-CM

## 2015-01-23 NOTE — Telephone Encounter (Signed)
Pt called and needs a refill on his Zofran and he BP medication he said it was lisinopril  HTZ but I didn't see it listed. jw

## 2015-01-24 MED ORDER — QUINAPRIL-HYDROCHLOROTHIAZIDE 20-25 MG PO TABS: 1.0000 | ORAL_TABLET | Freq: Every day | ORAL | Status: DC

## 2015-01-24 MED ORDER — GLUCOSE BLOOD VI STRP: ORAL_STRIP | Status: DC

## 2015-01-24 MED ORDER — ONDANSETRON HCL 4 MG PO TABS: 4.0000 mg | ORAL_TABLET | Freq: Three times a day (TID) | ORAL | Status: DC | PRN

## 2015-01-24 MED ORDER — ONETOUCH ULTRA 2 W/DEVICE KIT: PACK | Status: AC

## 2015-01-24 NOTE — Telephone Encounter (Signed)
Pt called again today and said he also needs a glucose meter and the strips also. jw

## 2015-01-25 ENCOUNTER — Telehealth: Payer: Self-pay | Admitting: Family Medicine

## 2015-01-25 NOTE — Telephone Encounter (Signed)
Paul [name] is leaving a form to be completed for his to receive his handicap placard. Please mail to his PO Box once it is completed. Thank you, Dorothey Baseman, ASA

## 2015-01-25 NOTE — Telephone Encounter (Signed)
Pt called and needs all medication that he takes called in at 90 days supply since he is out of town so much. Dr. Waynetta Sandy called in several yesterday can we change these to 90 qty. jw

## 2015-01-25 NOTE — Telephone Encounter (Signed)
Form placed in MD box for completion. Ariday Brinker, CMA.

## 2015-01-26 MED ORDER — METFORMIN HCL 500 MG PO TABS: 500.0000 mg | ORAL_TABLET | Freq: Two times a day (BID) | ORAL | Status: DC

## 2015-01-26 MED ORDER — ROSUVASTATIN CALCIUM 20 MG PO TABS: 20.0000 mg | ORAL_TABLET | Freq: Every day | ORAL | Status: DC

## 2015-01-26 MED ORDER — AMLODIPINE BESYLATE 5 MG PO TABS: 5.0000 mg | ORAL_TABLET | Freq: Every day | ORAL | Status: DC

## 2015-01-26 NOTE — Telephone Encounter (Signed)
All rx I sent recently were 90 day supply. Several medicines sent by Dr. Antoine Poche were 30 day, I resent these as 90 day.

## 2015-01-30 NOTE — Telephone Encounter (Signed)
Returned call to patient regarding handicap placard. Pt requesting due to having multiple heart stents. I discussed with him I did not feel he met criteria for the handicap parking placard, that if he felt his symptoms were this bad that I would defer his AHA classification to cardiologist. Pt requested form be forwarded to their office (Dr. Percival Spanish). -Dr. Lamar Benes

## 2015-04-05 ENCOUNTER — Telehealth: Payer: Self-pay | Admitting: Cardiology

## 2015-04-05 NOTE — Telephone Encounter (Signed)
Spoke with patient regarding disability parking plate form. He states he was notified this was ready for pick up (not documented that I can tell in EPIC nor was it documented that any paperwork was received). Informed him I was unable to locate paperwork where it is normally left near check out. Informed him I would route the message to Dr. Antoine Poche and his nurse who are both out of the office today

## 2015-04-05 NOTE — Telephone Encounter (Signed)
Pt called in stating that he has not received the paperwork to get his disabled license plate as of yet. He wanted to know if this had been mailed off yet. Please call  Thanks

## 2015-04-07 NOTE — Telephone Encounter (Signed)
I will sign when it is available.

## 2015-04-09 NOTE — Telephone Encounter (Signed)
Spoke with pt about his DMV paper work, pt stated he will come on fri 09/02 to pick up his paper work because he is out of town.

## 2015-04-11 NOTE — Telephone Encounter (Signed)
Can this encounter be closed?

## 2015-06-17 ENCOUNTER — Emergency Department (HOSPITAL_COMMUNITY)
Admission: EM | Admit: 2015-06-17 | Discharge: 2015-06-18 | Disposition: A | Discharge status: Home / Self Care | Payer: Medicare Other | Attending: Emergency Medicine | Admitting: Emergency Medicine

## 2015-06-17 ENCOUNTER — Encounter (HOSPITAL_COMMUNITY): Payer: Self-pay | Admitting: *Deleted

## 2015-06-17 DIAGNOSIS — Z9889 Other specified postprocedural states: Secondary | ICD-10-CM | POA: Diagnosis not present

## 2015-06-17 DIAGNOSIS — Z794 Long term (current) use of insulin: Secondary | ICD-10-CM | POA: Diagnosis not present

## 2015-06-17 DIAGNOSIS — Z7902 Long term (current) use of antithrombotics/antiplatelets: Secondary | ICD-10-CM | POA: Insufficient documentation

## 2015-06-17 DIAGNOSIS — R1084 Generalized abdominal pain: Secondary | ICD-10-CM | POA: Diagnosis not present

## 2015-06-17 DIAGNOSIS — Z79899 Other long term (current) drug therapy: Secondary | ICD-10-CM | POA: Insufficient documentation

## 2015-06-17 DIAGNOSIS — Z9861 Coronary angioplasty status: Secondary | ICD-10-CM | POA: Diagnosis not present

## 2015-06-17 DIAGNOSIS — I10 Essential (primary) hypertension: Secondary | ICD-10-CM | POA: Diagnosis not present

## 2015-06-17 DIAGNOSIS — R112 Nausea with vomiting, unspecified: Secondary | ICD-10-CM | POA: Diagnosis not present

## 2015-06-17 DIAGNOSIS — I252 Old myocardial infarction: Secondary | ICD-10-CM | POA: Insufficient documentation

## 2015-06-17 DIAGNOSIS — F419 Anxiety disorder, unspecified: Secondary | ICD-10-CM | POA: Insufficient documentation

## 2015-06-17 DIAGNOSIS — E119 Type 2 diabetes mellitus without complications: Secondary | ICD-10-CM | POA: Insufficient documentation

## 2015-06-17 DIAGNOSIS — Z7984 Long term (current) use of oral hypoglycemic drugs: Secondary | ICD-10-CM | POA: Insufficient documentation

## 2015-06-17 DIAGNOSIS — R509 Fever, unspecified: Secondary | ICD-10-CM | POA: Diagnosis not present

## 2015-06-17 DIAGNOSIS — I2510 Atherosclerotic heart disease of native coronary artery without angina pectoris: Secondary | ICD-10-CM | POA: Diagnosis not present

## 2015-06-17 LAB — COMPREHENSIVE METABOLIC PANEL
ALT: 15 U/L — ABNORMAL LOW (ref 17–63)
AST: 19 U/L (ref 15–41)
Albumin: 3.7 g/dL (ref 3.5–5.0)
Alkaline Phosphatase: 60 U/L (ref 38–126)
Anion gap: 11 (ref 5–15)
BUN: 15 mg/dL (ref 6–20)
CO2: 33 mmol/L — ABNORMAL HIGH (ref 22–32)
Calcium: 9.7 mg/dL (ref 8.9–10.3)
Chloride: 88 mmol/L — ABNORMAL LOW (ref 101–111)
Creatinine, Ser: 1.22 mg/dL (ref 0.61–1.24)
GFR calc Af Amer: >60 mL/min (ref >60)
GFR calc non Af Amer: >60 mL/min (ref >60)
Glucose, Bld: 283 mg/dL — ABNORMAL HIGH (ref 65–99)
Potassium: 3.8 mmol/L (ref 3.5–5.1)
Sodium: 132 mmol/L — ABNORMAL LOW (ref 135–145)
Total Bilirubin: 1 mg/dL (ref 0.3–1.2)
Total Protein: 8.7 g/dL — ABNORMAL HIGH (ref 6.5–8.1)

## 2015-06-17 LAB — CBC
HCT: 42 % (ref 39.0–52.0)
Hemoglobin: 13.7 g/dL (ref 13.0–17.0)
MCH: 27.2 pg (ref 26.0–34.0)
MCHC: 32.6 g/dL (ref 30.0–36.0)
MCV: 83.3 fL (ref 78.0–100.0)
Platelets: 258 10*3/uL (ref 150–400)
RBC: 5.04 MIL/uL (ref 4.22–5.81)
RDW: 13.5 % (ref 11.5–15.5)
WBC: 10.2 10*3/uL (ref 4.0–10.5)

## 2015-06-17 LAB — LIPASE, BLOOD: Lipase: 24 U/L (ref 11–51)

## 2015-06-17 MED ORDER — ONDANSETRON 4 MG PO TBDP
ORAL_TABLET | ORAL | Status: AC
  Filled 2015-06-17: qty 1

## 2015-06-17 MED ORDER — CHLORPROMAZINE HCL 25 MG PO TABS
25.0000 mg | ORAL_TABLET | Freq: Once | ORAL | Status: AC
  Administered 2015-06-17: 25 mg via ORAL
  Filled 2015-06-17: qty 1

## 2015-06-17 MED ORDER — PROMETHAZINE HCL 25 MG/ML IJ SOLN
25.0000 mg | Freq: Once | INTRAMUSCULAR | Status: AC
  Administered 2015-06-18: 25 mg via INTRAVENOUS
  Filled 2015-06-17: qty 1

## 2015-06-17 MED ORDER — ONDANSETRON HCL 4 MG/2ML IJ SOLN
4.0000 mg | Freq: Once | INTRAMUSCULAR | Status: AC
  Administered 2015-06-17: 4 mg via INTRAVENOUS
  Filled 2015-06-17: qty 2

## 2015-06-17 MED ORDER — SODIUM CHLORIDE 0.9 % IV BOLUS (SEPSIS)
1000.0000 mL | Freq: Once | INTRAVENOUS | Status: AC
  Administered 2015-06-17: 1000 mL via INTRAVENOUS

## 2015-06-17 MED ORDER — ONDANSETRON 4 MG PO TBDP
4.0000 mg | ORAL_TABLET | Freq: Once | ORAL | Status: AC | PRN
  Administered 2015-06-17: 4 mg via ORAL

## 2015-06-17 NOTE — ED Notes (Signed)
Dr. Kohut at bedside 

## 2015-06-17 NOTE — ED Notes (Signed)
Pt reports n/v x 4 days.

## 2015-06-17 NOTE — ED Provider Notes (Signed)
CSN: [id number]     Arrival date & time 06/17/15  1706 History   First MD Initiated Contact with Patient 06/17/15 2120     Chief Complaint  Patient presents with  . Emesis     (Consider location/radiation/quality/duration/timing/severity/associated sxs/prior Treatment) HPI   44 year old male with positive vomiting. Symptom onset 4 days ago. Multiple episodes of nonbilious/nonbloody emesis. Associated with diffuse, crampy abdominal pain. No diarrhea. No sick contacts. Denies any fever. Feels somewhat bloated, but not really distended. Denies a past history of any abdominal surgeries. Has had difficulty keepign liquids down today.   Past Medical History  Diagnosis Date  . Hypertension   . CAD (coronary artery disease)     Anterior MI 2007 with DES stenting of an occluded LAD, distal 70% stenosis, OM 60% stenosis, circumflex 90% stenosis treated with a drug-eluting stent, PA 90% stenosis treated medically.  Cath January 2013 70-80% diagonal stenosis in a small vessel, OM 50% stenosis, PDA long 75% stenosis. EF 45%. He was managed medically.  . Diabetes mellitus   . Hyperlipidemia   . Cardiomyopathy, ischemic     EF 45%  . History of chest pain 11/25/2010  . Myocardial infarction ([place])   . Anxiety    Past Surgical History  Procedure Laterality Date  . Catherization  08/25/11    Dr. Percival Spanish  . Coronary angioplasty with stent placement  207, 2011    4 stents placed  . Left heart catheterization with coronary angiogram N/A 08/25/2011    Procedure: LEFT HEART CATHETERIZATION WITH CORONARY ANGIOGRAM;  Surgeon: Minus Breeding, MD;  Location: [facility] CATH LAB;  Service: Cardiovascular;  Laterality: N/A;   Family History  Problem Relation Age of Onset  . Hypertension Mother   . Hypertension Father   . Diabetes Maternal Aunt   . Diabetes Maternal Uncle   . Cancer Maternal Grandmother    Social History  Substance Use Topics  . Smoking status: Never Smoker   . Smokeless tobacco: None  .  Alcohol Use: No    Review of Systems  All systems reviewed and negative, other than as noted in HPI.   Allergies  Shrimp  Home Medications   Prior to Admission medications   Medication Sig Start Date End Date Taking? Authorizing Provider  amLODipine (NORVASC) 5 MG tablet Take 1 tablet (5 mg total) by mouth daily. 01/26/15  Yes Leone Brand, MD  aspirin 81 MG EC tablet Take 1 tablet (81 mg total) by mouth daily. 10/30/13  Yes Waldemar Dickens, MD  busPIRone (BUSPAR) 7.5 MG tablet Take 1 tablet (7.5 mg total) by mouth daily. 04/09/14  Yes Leone Brand, MD  carvedilol (COREG) 25 MG tablet Take 1 tablet (25 mg total) by mouth 2 (two) times daily with a meal. 01/09/15  Yes Leone Brand, MD  citalopram (CELEXA) 20 MG tablet Take 1 pill twice a day 12/17/14  Yes Leone Brand, MD  clopidogrel (PLAVIX) 75 MG tablet Take 1 tablet (75 mg total) by mouth daily. 05/17/14  Yes Leone Brand, MD  escitalopram (LEXAPRO) 20 MG tablet Take 20 mg by mouth 2 (two) times daily.   Yes Historical Provider, MD  hydrALAZINE (APRESOLINE) 25 MG tablet Take 1 tablet (25 mg total) by mouth 3 (three) times daily. 01/09/15  Yes Leone Brand, MD  insulin glargine (LANTUS) 100 unit/mL SOPN Inject 0.14 mLs (14 Units total) into the skin at bedtime. 04/04/14  Yes Leone Brand, MD  isosorbide mononitrate (IMDUR) 30 MG  24 hr tablet Take 1 tablet (30 mg total) by mouth daily. 11/09/14 11/09/15 Yes Leone Brand, MD  metFORMIN (GLUCOPHAGE) 500 MG tablet Take 1 tablet (500 mg total) by mouth 2 (two) times daily with a meal. 01/26/15  Yes Leone Brand, MD  Multiple Vitamin (MULTIVITAMIN WITH MINERALS) TABS tablet Take 1 tablet by mouth daily.   Yes Historical Provider, MD  nitroGLYCERIN (NITROSTAT) 0.4 MG SL tablet Place 1 tablet (0.4 mg total) under the tongue every 5 (five) minutes as needed.   Yes Leone Brand, MD  ondansetron (ZOFRAN) 4 MG tablet Take 1 tablet (4 mg total) by mouth every 8 (eight) hours as needed for  nausea. 01/24/15  Yes Leone Brand, MD  Probiotic Product (PROBIOTIC PO) Take 1 tablet by mouth daily.   Yes Historical Provider, MD  quinapril-hydrochlorothiazide (ACCURETIC) 20-25 MG per tablet Take 1 tablet by mouth daily. 01/24/15  Yes Leone Brand, MD  rosuvastatin (CRESTOR) 20 MG tablet Take 1 tablet (20 mg total) by mouth daily. 01/26/15  Yes Leone Brand, MD  triamcinolone cream (KENALOG) 0.1 % Apply topically 2 (two) times daily. 11/09/14 11/09/15 Yes Leone Brand, MD  Blood Glucose Monitoring Suppl (ONE TOUCH ULTRA 2) W/DEVICE KIT Check sugar once daily. ICD10 E11.65 01/24/15   Leone Brand, MD  glucose blood (ONE TOUCH ULTRA TEST) test strip Check sugar once daily in the morning. ICD10 E11.65 01/24/15   Leone Brand, MD  hydrOXYzine (ATARAX/VISTARIL) 25 MG tablet Take 1 tablet (25 mg total) by mouth 3 (three) times daily as needed for anxiety. Patient not taking: Reported on 06/17/2015 04/23/14   Leone Brand, MD  Insulin Pen Needle 32G X 6 MM MISC To be used with the Levemir flex pen 10/30/13   Waldemar Dickens, MD  ONE [facility] LANCETS MISC Use to check sugar once daily. ICD10 E11.65 01/09/15   Willeen Niece, MD   BP 136/93 mmHg  Pulse 76  Temp(Src) 99.9 F (37.7 C) (Oral)  Resp 20  SpO2 100% Physical Exam  Constitutional: He appears well-developed and well-nourished. No distress.  HENT:  Head: Normocephalic and atraumatic.  Eyes: Conjunctivae are normal. Right eye exhibits no discharge. Left eye exhibits no discharge.  Neck: Neck supple.  Cardiovascular: Normal rate, regular rhythm and normal heart sounds.  Exam reveals no gallop and no friction rub.   No murmur heard. Pulmonary/Chest: Effort normal and breath sounds normal. No respiratory distress.  Abdominal: Soft. He exhibits no distension. There is no tenderness.  Musculoskeletal: He exhibits no edema or tenderness.  Neurological: He is alert.  Skin: Skin is warm and dry.  Psychiatric: He has a normal mood and affect. His  behavior is normal. Thought content normal.  Nursing note and vitals reviewed.   ED Course  Procedures (including critical care time) Labs Review Labs Reviewed  COMPREHENSIVE METABOLIC PANEL - Abnormal; Notable for the following:    Sodium 132 (*)    Chloride 88 (*)    CO2 33 (*)    Glucose, Bld 283 (*)    Total Protein 8.7 (*)    ALT 15 (*)    All other components within normal limits  LIPASE, BLOOD  CBC  URINALYSIS, ROUTINE W REFLEX MICROSCOPIC (NOT AT [facility])    Imaging Review No results found. I have personally reviewed and evaluated these images and lab results as part of my medical decision-making.   EKG Interpretation None      MDM  Final diagnoses:  Non-intractable vomiting with nausea, vomiting of unspecified type    Nausea/vomiting. Benign abdominal exam. Afebrile. Nontoxic. HD stable. Unremarkable w/u. Feels better. It has been determined that no acute conditions requiring further emergency intervention are present at this time. The patient has been advised of the diagnosis and plan. I reviewed any labs and imaging including any potential incidental findings. We have discussed signs and symptoms that warrant return to the ED and they are listed in the discharge instructions.      Virgel Manifold, MD 06/30/15 [id number]

## 2015-06-18 ENCOUNTER — Emergency Department (HOSPITAL_COMMUNITY): Payer: Medicare Other

## 2015-06-18 DIAGNOSIS — R509 Fever, unspecified: Secondary | ICD-10-CM | POA: Diagnosis not present

## 2015-06-18 MED ORDER — ONDANSETRON HCL 4 MG PO TABS: 4.0000 mg | ORAL_TABLET | Freq: Four times a day (QID) | ORAL | Status: DC

## 2015-06-18 NOTE — Discharge Instructions (Signed)
Nausea, Adult Nausea is the feeling that you have an upset stomach or have to vomit. Nausea by itself is not likely a serious concern, but it may be an early sign of more serious medical problems. As nausea gets worse, it can lead to vomiting. If vomiting develops, there is the risk of dehydration.  CAUSES   Viral infections.  Food poisoning.  Medicines.  Pregnancy.  Motion sickness.  Migraine headaches.  Emotional distress.  Severe pain from any source.  Alcohol intoxication. HOME CARE INSTRUCTIONS  Get plenty of rest.  Ask your caregiver about specific rehydration instructions.  Eat small amounts of food and sip liquids more often.  Take all medicines as told by your caregiver. SEEK MEDICAL CARE IF:  You have not improved after 2 days, or you get worse.  You have a headache. SEEK IMMEDIATE MEDICAL CARE IF:   You have a fever.  You faint.  You keep vomiting or have blood in your vomit.  You are extremely weak or dehydrated.  You have dark or bloody stools.  You have severe chest or abdominal pain. MAKE SURE YOU:  Understand these instructions.  Will watch your condition.  Will get help right away if you are not doing well or get worse.   This information is not intended to replace advice given to you by your health care provider. Make sure you discuss any questions you have with your health care provider.   Document Released: 09/03/2004 Document Revised: 08/17/2014 Document Reviewed: 04/08/2011 Elsevier Interactive Patient Education 2016 Elsevier Inc.  Nausea and Vomiting Nausea is a sick feeling that often comes before throwing up (vomiting). Vomiting is a reflex where stomach contents come out of your mouth. Vomiting can cause severe loss of body fluids (dehydration). Children and elderly adults can become dehydrated quickly, especially if they also have diarrhea. Nausea and vomiting are symptoms of a condition or disease. It is important to find the  cause of your symptoms. CAUSES   Direct irritation of the stomach lining. This irritation can result from increased acid production (gastroesophageal reflux disease), infection, food poisoning, taking certain medicines (such as nonsteroidal anti-inflammatory drugs), alcohol use, or tobacco use.  Signals from the brain.These signals could be caused by a headache, heat exposure, an inner ear disturbance, increased pressure in the brain from injury, infection, a tumor, or a concussion, pain, emotional stimulus, or metabolic problems.  An obstruction in the gastrointestinal tract (bowel obstruction).  Illnesses such as diabetes, hepatitis, gallbladder problems, appendicitis, kidney problems, cancer, sepsis, atypical symptoms of a heart attack, or eating disorders.  Medical treatments such as chemotherapy and radiation.  Receiving medicine that makes you sleep (general anesthetic) during surgery. DIAGNOSIS Your caregiver may ask for tests to be done if the problems do not improve after a few days. Tests may also be done if symptoms are severe or if the reason for the nausea and vomiting is not clear. Tests may include:  Urine tests.  Blood tests.  Stool tests.  Cultures (to look for evidence of infection).  X-rays or other imaging studies. Test results can help your caregiver make decisions about treatment or the need for additional tests. TREATMENT You need to stay well hydrated. Drink frequently but in small amounts.You may wish to drink water, sports drinks, clear broth, or eat frozen ice pops or gelatin dessert to help stay hydrated.When you eat, eating slowly may help prevent nausea.There are also some antinausea medicines that may help prevent nausea. HOME CARE INSTRUCTIONS  Take all medicine as directed by your caregiver.  If you do not have an appetite, do not force yourself to eat. However, you must continue to drink fluids.  If you have an appetite, eat a normal diet  unless your caregiver tells you differently.  Eat a variety of complex carbohydrates (rice, wheat, potatoes, bread), lean meats, yogurt, fruits, and vegetables.  Avoid high-fat foods because they are more difficult to digest.  Drink enough water and fluids to keep your urine clear or pale yellow.  If you are dehydrated, ask your caregiver for specific rehydration instructions. Signs of dehydration may include:  Severe thirst.  Dry lips and mouth.  Dizziness.  Dark urine.  Decreasing urine frequency and amount.  Confusion.  Rapid breathing or pulse. SEEK IMMEDIATE MEDICAL CARE IF:   You have blood or brown flecks (like coffee grounds) in your vomit.  You have black or bloody stools.  You have a severe headache or stiff neck.  You are confused.  You have severe abdominal pain.  You have chest pain or trouble breathing.  You do not urinate at least once every 8 hours.  You develop cold or clammy skin.  You continue to vomit for longer than 24 to 48 hours.  You have a fever. MAKE SURE YOU:   Understand these instructions.  Will watch your condition.  Will get help right away if you are not doing well or get worse.   This information is not intended to replace advice given to you by your health care provider. Make sure you discuss any questions you have with your health care provider.   Document Released: 07/27/2005 Document Revised: 10/19/2011 Document Reviewed: 12/24/2010 Elsevier Interactive Patient Education [employer] Elsevier Inc.

## 2015-06-18 NOTE — ED Notes (Signed)
Patient verbalized understanding of discharge instructions and prescription medication and denies further questions at this time.  VS stable.

## 2015-07-02 ENCOUNTER — Ambulatory Visit (INDEPENDENT_AMBULATORY_CARE_PROVIDER_SITE_OTHER): Payer: Medicare Other | Admitting: Family Medicine

## 2015-07-02 ENCOUNTER — Encounter: Payer: Self-pay | Admitting: Family Medicine

## 2015-07-02 VITALS — BP 138/88 | HR 69 | Temp 98.1°F | Ht 71.0 in | Wt 170.5 lb

## 2015-07-02 DIAGNOSIS — I2510 Atherosclerotic heart disease of native coronary artery without angina pectoris: Secondary | ICD-10-CM | POA: Diagnosis not present

## 2015-07-02 DIAGNOSIS — Z23 Encounter for immunization: Secondary | ICD-10-CM | POA: Diagnosis not present

## 2015-07-02 DIAGNOSIS — G43A0 Cyclical vomiting, not intractable: Secondary | ICD-10-CM | POA: Diagnosis not present

## 2015-07-02 DIAGNOSIS — E1165 Type 2 diabetes mellitus with hyperglycemia: Secondary | ICD-10-CM | POA: Diagnosis not present

## 2015-07-02 DIAGNOSIS — I10 Essential (primary) hypertension: Secondary | ICD-10-CM | POA: Diagnosis not present

## 2015-07-02 DIAGNOSIS — R1115 Cyclical vomiting syndrome unrelated to migraine: Secondary | ICD-10-CM

## 2015-07-02 DIAGNOSIS — E118 Type 2 diabetes mellitus with unspecified complications: Secondary | ICD-10-CM | POA: Diagnosis not present

## 2015-07-02 LAB — POCT GLYCOSYLATED HEMOGLOBIN (HGB A1C): Hemoglobin A1C: 12.1

## 2015-07-02 MED ORDER — GLIPIZIDE 5 MG PO TABS: 5.0000 mg | ORAL_TABLET | Freq: Two times a day (BID) | ORAL | Status: DC

## 2015-07-02 MED ORDER — CLOPIDOGREL BISULFATE 75 MG PO TABS: 75.0000 mg | ORAL_TABLET | Freq: Every day | ORAL | Status: DC

## 2015-07-02 NOTE — Progress Notes (Signed)
   Subjective:    Patient ID: Paul [name], male    DOB: 1971/03/17, 44 y.o.   MRN: [id number]  HPI  CC: medication refills  # Nausea/vomiting:  Went to ED about 2 weeks ago for this issue  Stopped taking crestor, hasn't had any vomiting since that time.   Zofran doesn't really help.  He has had this in the past. Says it normally comes every few months  Denies marijuana use ROS: no abdominal pain, no current nausea/vomiting  # Diabetes  hasn't been checking CBG, has not gotten new meter/strips  Taking 14 units lantus during the day, says he has only missed few doses  Says he has been living out of his car recently ROS: no polyuria/polydipsia  # Hypertension  Says he is taking all of his medicines  Doesn't check BP at home  Denies any side effects he attributes to BP medications ROS: no SOB, no CP  Social Hx: never smoker  Review of Systems   See HPI for ROS.   Past medical history, surgical, family, and social history reviewed and updated in the EMR as appropriate. Objective:  BP 138/88 mmHg  Pulse 69  Temp(Src) 98.1 F (36.7 C) (Oral)  Ht 5\' 11"  (1.803 m)  Wt 170 lb 8 oz (77.338 kg)  BMI 23.79 kg/m2 Vitals and nursing note reviewed  General: NAD CV: RRR, normal s1s2, no murmurs, rubs or gallop. 2+ radial pulses bilaterally  Resp: clear to auscultation bilaterally, normal effort Abdomen: soft, nontender, nondistended, normal bowel sounds. Thin. Neuro: alert and oriented, no deficits Psych: normal thought content and speech.  Assessment & Plan:  Diabetes mellitus type 2, uncontrolled Still uncontrolled, A1c today 12.1. States he is compliant with his lantus and metformin, however I'm concerned since he is living out of his car about insulin compliance. He cannot check his CBG currently since he has still not picked up a CBG meter so I am reluctant to increase his lantus. He has never been tried on additional orals, so I'm willing to try him on  glipizide to see if we are able to get his A1c under control via different plan. Continue metformin. Follow up 2 months.  Hypertension Not at goal currently despite stating he takes his medicines. On hydralazine 25mg  TID, imdur 24hr 30mg , amlodipine 5mg , coreg 25mg , quinapril-hctz 20-25mg . I am of the opinion that he is non-adherent to the blood pressure recently as he has had good control over the past year. Asked him to go to pharmacy to check BP while away from clinic. Follow up 2 months.  Vomiting Patient elected to stop crestor for now. Since his symptoms are not consistent and seem to be more cyclical in nature occur every few months would favor other etiology (pt denies marijuana use). Currently resolved right now. Okay for trial of drug holiday, if does well will see if he can be convinced to start it again vs try atorvastatin. Follow up 2 months.

## 2015-07-02 NOTE — Patient Instructions (Signed)
Blood pressure: Take all of your blood pressure medicines as they are prescribed already, no changes. Measure your blood pressure at the pharmacy at least once a week for the next month. If your blood pressure is greater than 140/90, you need to call us and let us know.  Diabetes: A1c is still uncontrolled, it is 12.1. We consider well controlled to be greater less than 7.5 STOP taking the lantus START taking the glipizide 5mg  tablets twice a day. Take before a meal (breakfast and dinner) CONTINUE taking the metformin 500mg  tablets (1000mg  in the morning, ideally if you tolerate it take 1000mg  at dinner time as well but okay to continue taking 500mg )  Cholesterol Okay to stop the crestor for a month or so and see if your nausea and vomiting go away. We will check your cholesterol level in the blood again at the next visit.

## 2015-07-04 NOTE — Assessment & Plan Note (Signed)
Not at goal currently despite stating he takes his medicines. On hydralazine 25mg  TID, imdur 24hr 30mg , amlodipine 5mg , coreg 25mg , quinapril-hctz 20-25mg . I am of the opinion that he is non-adherent to the blood pressure recently as he has had good control over the past year. Asked him to go to pharmacy to check BP while away from clinic. Follow up 2 months.

## 2015-07-04 NOTE — Assessment & Plan Note (Signed)
Patient elected to stop crestor for now. Since his symptoms are not consistent and seem to be more cyclical in nature occur every few months would favor other etiology (pt denies marijuana use). Currently resolved right now. Okay for trial of drug holiday, if does well will see if he can be convinced to start it again vs try atorvastatin. Follow up 2 months.

## 2015-07-04 NOTE — Assessment & Plan Note (Signed)
Still uncontrolled, A1c today 12.1. States he is compliant with his lantus and metformin, however I'm concerned since he is living out of his car about insulin compliance. He cannot check his CBG currently since he has still not picked up a CBG meter so I am reluctant to increase his lantus. He has never been tried on additional orals, so I'm willing to try him on glipizide to see if we are able to get his A1c under control via different plan. Continue metformin. Follow up 2 months.

## 2015-11-20 ENCOUNTER — Other Ambulatory Visit: Payer: Self-pay | Admitting: *Deleted

## 2015-11-20 DIAGNOSIS — I10 Essential (primary) hypertension: Secondary | ICD-10-CM

## 2015-11-20 MED ORDER — ISOSORBIDE MONONITRATE ER 30 MG PO TB24: 30.0000 mg | ORAL_TABLET | Freq: Every day | ORAL | Status: DC

## 2015-11-20 NOTE — Telephone Encounter (Signed)
Patient currently out of town but would like a refill of his [facility] ([address] Patient would like a 90-day supply if possible, pharmacy changed in epic. Please contact patient when sent.

## 2015-11-20 NOTE — Telephone Encounter (Signed)
Will give 30 day supply since patient was asked to follow-up with Dr. Waynetta SandyWight in 2 months from last visit in November.

## 2015-11-21 NOTE — Telephone Encounter (Signed)
Pt notifed. Pt said he will make an apt to see Dr. Waynetta SandyWight. Sunday SpillersSharon T Saunders, CMA

## 2015-12-05 ENCOUNTER — Encounter: Payer: Self-pay | Admitting: Family Medicine

## 2015-12-05 ENCOUNTER — Ambulatory Visit (INDEPENDENT_AMBULATORY_CARE_PROVIDER_SITE_OTHER): Payer: Medicare Other | Admitting: Family Medicine

## 2015-12-05 VITALS — BP 116/80 | Temp 98.2°F | Ht 71.0 in | Wt 171.0 lb

## 2015-12-05 DIAGNOSIS — I10 Essential (primary) hypertension: Secondary | ICD-10-CM

## 2015-12-05 DIAGNOSIS — E118 Type 2 diabetes mellitus with unspecified complications: Secondary | ICD-10-CM | POA: Diagnosis not present

## 2015-12-05 DIAGNOSIS — E1165 Type 2 diabetes mellitus with hyperglycemia: Secondary | ICD-10-CM | POA: Diagnosis not present

## 2015-12-05 LAB — POCT GLYCOSYLATED HEMOGLOBIN (HGB A1C): Hemoglobin A1C: 10.1

## 2015-12-05 MED ORDER — GLIPIZIDE 5 MG PO TABS: 5.0000 mg | ORAL_TABLET | Freq: Two times a day (BID) | ORAL | Status: DC

## 2015-12-05 MED ORDER — TRIAMCINOLONE ACETONIDE 0.1 % EX CREA: 1.0000 "application " | TOPICAL_CREAM | Freq: Two times a day (BID) | CUTANEOUS | Status: DC

## 2015-12-05 MED ORDER — METFORMIN HCL 1000 MG PO TABS: 1000.0000 mg | ORAL_TABLET | Freq: Two times a day (BID) | ORAL | Status: DC

## 2015-12-05 NOTE — Progress Notes (Signed)
   Subjective:    Patient ID: Paul HammondRoosevelt [name], male    DOB: 10-26-1970, 45 y.o.   MRN: [id number]  HPI  CC: follow up   # DM:  "Doing well"  Drinking more water, also started eating garlic.   Glipizide 5mg  twice a day -- feels he is doing well with this.  Metformin 500mg  twice a day -- has not yet   Not checking his sugar often -- last time was last week  Last eye exam: not sure ROS: no polyuria  # Hypertension  Took all medicines  No dizziness, lightheadedness, no passing out ROS: no CP, no SOB  Social Hx: never smoker  Review of Systems   See HPI for ROS.   Past medical history, surgical, family, and social history reviewed and updated in the EMR as appropriate. Objective:  BP 116/80 mmHg  Temp(Src) 98.2 F (36.8 C) (Oral)  Ht 5\' 11"  (1.803 m)  Wt 171 lb (77.565 kg)  BMI 23.86 kg/m2 Vitals and nursing note reviewed  General: no apparent distress  CV: normal rate, regular rhythm, no murmurs, rubs or gallop  Resp: clear to auscultation bilaterally, normal effort  Assessment & Plan:  Diabetes mellitus type 2, uncontrolled A1c improved 12>10 but still uncontrolled. Wife at visit and discussed heavily diet modification, high glycemic foods which are still a big issue for him. Still not really checking his sugars. To make eye exam appointment. Follow up 3 months.  Hypertension At goal, continue current regimen. Follow up 3 months with DM follow up

## 2015-12-05 NOTE — Patient Instructions (Signed)
SCHEDULE YOUR VISIT FOR YOUR DIABETIC EYE EXAM. HAVE THEM FAX THEIR NOTES TO OUR CLINIC. Diet Recommendations for Diabetes   Starchy (carb) foods include: Bread, rice, pasta, potatoes, corn, crackers, bagels, muffins, all baked goods.  (Fruits, milk, and yogurt also have carbohydrate, but most of these foods will not spike your blood sugar as the starchy foods will.)  A few fruits do cause high blood sugars; use small portions of bananas (limit to 1/2 at a time), grapes, and tropical fruits.    Protein foods include: Meat, fish, poultry, eggs, dairy foods, and beans such as pinto and kidney beans (beans also provide carbohydrate).   1. Eat at least 3 meals and 1-2 snacks per day. Never go more than 4-5 hours while awake without eating.  2. Limit starchy foods to TWO per meal and ONE per snack. ONE portion of a starchy  food is equal to the following:   - ONE slice of bread (or its equivalent, such as half of a hamburger bun).   - 1/2 cup of a "scoopable" starchy food such as potatoes or rice.   - 15 grams of carbohydrate as shown on food label.  3. Both lunch and dinner should include a protein food, a carb food, and vegetables.   - Obtain twice as many veg's as protein or carbohydrate foods for both lunch and dinner.   - Fresh or frozen veg's are best.   - Try to keep frozen veg's on hand for a quick vegetable serving.    4. Breakfast should always include protein.

## 2015-12-11 NOTE — Assessment & Plan Note (Signed)
At goal, continue current regimen. Follow up 3 months with DM follow up

## 2015-12-11 NOTE — Assessment & Plan Note (Signed)
A1c improved 12>10 but still uncontrolled. Wife at visit and discussed heavily diet modification, high glycemic foods which are still a big issue for him. Still not really checking his sugars. To make eye exam appointment. Follow up 3 months.

## 2015-12-16 ENCOUNTER — Other Ambulatory Visit: Payer: Self-pay | Admitting: *Deleted

## 2015-12-16 DIAGNOSIS — I10 Essential (primary) hypertension: Secondary | ICD-10-CM

## 2015-12-16 MED ORDER — ISOSORBIDE MONONITRATE ER 30 MG PO TB24: 30.0000 mg | ORAL_TABLET | Freq: Every day | ORAL | Status: DC

## 2015-12-30 ENCOUNTER — Other Ambulatory Visit: Payer: Self-pay | Admitting: Family Medicine

## 2015-12-30 DIAGNOSIS — I10 Essential (primary) hypertension: Secondary | ICD-10-CM

## 2015-12-30 NOTE — Telephone Encounter (Signed)
Pt is calling for refills on Isosorbide 90 day QTY, Triamcinolone 44 OZ, Amlodipine 90 QTY to [facility] [place] jw

## 2015-12-31 MED ORDER — ISOSORBIDE MONONITRATE ER 30 MG PO TB24: 30.0000 mg | ORAL_TABLET | Freq: Every day | ORAL | Status: DC

## 2015-12-31 MED ORDER — TRIAMCINOLONE ACETONIDE 0.1 % EX CREA: 1.0000 "application " | TOPICAL_CREAM | Freq: Two times a day (BID) | CUTANEOUS | Status: DC

## 2015-12-31 MED ORDER — AMLODIPINE BESYLATE 5 MG PO TABS: 5.0000 mg | ORAL_TABLET | Freq: Every day | ORAL | Status: DC

## 2016-03-03 ENCOUNTER — Other Ambulatory Visit: Payer: Self-pay | Admitting: Family Medicine

## 2016-03-03 ENCOUNTER — Other Ambulatory Visit: Payer: Self-pay | Admitting: *Deleted

## 2016-03-04 MED ORDER — QUINAPRIL-HYDROCHLOROTHIAZIDE 20-25 MG PO TABS: 1.0000 | ORAL_TABLET | Freq: Every day | ORAL | 3 refills | Status: DC

## 2016-03-05 ENCOUNTER — Telehealth: Payer: Self-pay | Admitting: *Deleted

## 2016-03-05 DIAGNOSIS — F411 Generalized anxiety disorder: Secondary | ICD-10-CM

## 2016-03-05 NOTE — Telephone Encounter (Signed)
Patient requesting a refill on buspar. [facility]

## 2016-03-06 MED ORDER — BUSPIRONE HCL 7.5 MG PO TABS: 7.5000 mg | ORAL_TABLET | Freq: Every day | ORAL | 0 refills | Status: DC

## 2016-03-16 ENCOUNTER — Other Ambulatory Visit: Payer: Self-pay | Admitting: *Deleted

## 2016-03-16 MED ORDER — CARVEDILOL 25 MG PO TABS: 25.0000 mg | ORAL_TABLET | Freq: Two times a day (BID) | ORAL | 2 refills | Status: DC

## 2016-03-16 NOTE — Telephone Encounter (Signed)
Pt informed. Paul [name], CMA  

## 2016-04-03 ENCOUNTER — Other Ambulatory Visit: Payer: Self-pay | Admitting: Family Medicine

## 2016-04-03 DIAGNOSIS — F411 Generalized anxiety disorder: Secondary | ICD-10-CM

## 2016-04-08 ENCOUNTER — Other Ambulatory Visit: Payer: Self-pay | Admitting: Family Medicine

## 2016-04-08 DIAGNOSIS — I10 Essential (primary) hypertension: Secondary | ICD-10-CM

## 2016-04-09 ENCOUNTER — Other Ambulatory Visit: Payer: Self-pay | Admitting: *Deleted

## 2016-04-09 MED ORDER — AMLODIPINE BESYLATE 5 MG PO TABS: 5.0000 mg | ORAL_TABLET | Freq: Every day | ORAL | 0 refills | Status: DC

## 2016-04-17 ENCOUNTER — Ambulatory Visit: Payer: Medicare Other | Admitting: Cardiology

## 2016-05-02 DIAGNOSIS — Z23 Encounter for immunization: Secondary | ICD-10-CM | POA: Diagnosis not present

## 2016-05-29 ENCOUNTER — Other Ambulatory Visit: Payer: Self-pay | Admitting: Family Medicine

## 2016-05-29 DIAGNOSIS — I10 Essential (primary) hypertension: Secondary | ICD-10-CM

## 2016-06-21 NOTE — Progress Notes (Signed)
HPI The patient presents for evaluation of CAD.  He reports that he has been doing well.  The patient denies any new symptoms such as chest discomfort, neck or arm discomfort. There has been no new shortness of breath, PND or orthopnea. There have been no reported palpitations, presyncope or syncope.  He is not exercising but he does do his usual activities without symptoms.     Allergies  Allergen Reactions  . Shrimp [Shellfish Allergy] Swelling    Current Outpatient Prescriptions  Medication Sig Dispense Refill  . amLODipine (NORVASC) 5 MG tablet Take 1 tablet (5 mg total) by mouth daily. 90 tablet 0  . aspirin 81 MG EC tablet Take 1 tablet (81 mg total) by mouth daily. 30 tablet 11  . Blood Glucose Monitoring Suppl (ONE TOUCH ULTRA 2) W/DEVICE KIT Check sugar once daily. ICD10 E11.65 1 each 0  . busPIRone (BUSPAR) 7.5 MG tablet Take 1 tablet (7.5 mg total) by mouth daily. 90 tablet 0  . carvedilol (COREG) 25 MG tablet Take 1 tablet (25 mg total) by mouth 2 (two) times daily with a meal. 180 tablet 2  . citalopram (CELEXA) 20 MG tablet TAKE ONE TABLET BY MOUTH TWICE DAILY 180 tablet 3  . clopidogrel (PLAVIX) 75 MG tablet Take 1 tablet (75 mg total) by mouth daily. 90 tablet 3  . escitalopram (LEXAPRO) 20 MG tablet Take 20 mg by mouth 2 (two) times daily.    Marland Kitchen GARLIC PO Take by mouth. Pt eats 2 garlic cloves a day mixed with applesauce    . glipiZIDE (GLUCOTROL) 5 MG tablet Take 1 tablet (5 mg total) by mouth 2 (two) times daily before a meal. 180 tablet 1  . glucose blood (ONE TOUCH ULTRA TEST) test strip Check sugar once daily in the morning. ICD10 E11.65 30 each 5  . hydrALAZINE (APRESOLINE) 25 MG tablet TAKE ONE TABLET BY MOUTH THREE TIMES DAILY 180 tablet 4  . hydrOXYzine (ATARAX/VISTARIL) 25 MG tablet Take 1 tablet (25 mg total) by mouth 3 (three) times daily as needed for anxiety. 90 tablet 0  . isosorbide mononitrate (IMDUR) 30 MG 24 hr tablet TAKE ONE TABLET BY MOUTH ONCE DAILY  30 tablet 1  . metFORMIN (GLUCOPHAGE) 1000 MG tablet Take 1 tablet (1,000 mg total) by mouth 2 (two) times daily with a meal. 180 tablet 1  . Multiple Vitamin (MULTIVITAMIN WITH MINERALS) TABS tablet Take 1 tablet by mouth daily.    . nitroGLYCERIN (NITROSTAT) 0.4 MG SL tablet Place 1 tablet (0.4 mg total) under the tongue every 5 (five) minutes as needed. 25 tablet 11  . ondansetron (ZOFRAN) 4 MG tablet Take 1 tablet (4 mg total) by mouth every 8 (eight) hours as needed for nausea. 30 tablet 1  . ONE TOUCH LANCETS MISC Use to check sugar once daily. ICD10 E11.65 30 each 5  . Probiotic Product (PROBIOTIC PO) Take 1 tablet by mouth daily.    . quinapril-hydrochlorothiazide (ACCURETIC) 20-25 MG tablet Take 1 tablet by mouth daily. 90 tablet 3  . rosuvastatin (CRESTOR) 20 MG tablet Take 1 tablet (20 mg total) by mouth daily. 90 tablet 3  . triamcinolone cream (KENALOG) 0.1 % Apply 1 application topically 2 (two) times daily. 30 g 0   No current facility-administered medications for this visit.     Past Medical History:  Diagnosis Date  . Anxiety   . CAD (coronary artery disease)    Anterior MI 2007 with DES stenting of an occluded  LAD, distal 70% stenosis, OM 60% stenosis, circumflex 90% stenosis treated with a drug-eluting stent, PA 90% stenosis treated medically.  Cath January 2013 70-80% diagonal stenosis in a small vessel, OM 50% stenosis, PDA long 75% stenosis. EF 45%. He was managed medically.  . Cardiomyopathy, ischemic    EF 45%  . Diabetes mellitus   . History of chest pain 11/25/2010  . Hyperlipidemia   . Hypertension   . Myocardial infarction     Past Surgical History:  Procedure Laterality Date  . Catherization  08/25/11   Dr. Percival Spanish  . CORONARY ANGIOPLASTY WITH STENT PLACEMENT  207, 2011   4 stents placed  . LEFT HEART CATHETERIZATION WITH CORONARY ANGIOGRAM N/A 08/25/2011   Procedure: LEFT HEART CATHETERIZATION WITH CORONARY ANGIOGRAM;  Surgeon: Minus Breeding, MD;   Location: [facility] CATH LAB;  Service: Cardiovascular;  Laterality: N/A;    ROS:  As stated in the HPI and negative for all other systems.  PHYSICAL EXAM BP 120/88 (BP Location: Right Arm, Patient Position: Sitting, Cuff Size: Normal)   Pulse 81   Ht 5' 11" (1.803 m)   Wt 172 lb (78 kg)   BMI 23.99 kg/m  GENERAL:  Well appearing NECK:  No jugular venous distention, waveform within normal limits, carotid upstroke brisk and symmetric, no bruits, no thyromegaly LUNGS:  Clear to auscultation bilaterally CHEST:  Unremarkable HEART:  PMI not displaced or sustained,S1 and S2 within normal limits, no S3, no S4, no clicks, no rubs, no murmurs ABD:  Flat, positive bowel sounds normal in frequency in pitch, no bruits, no rebound, no guarding, no midline pulsatile mass, no hepatomegaly, no splenomegaly EXT:  2 plus pulses throughout, no edema, no cyanosis no clubbing.  SKIN:  No rashes no nodules   EKG: Rate 81, normal sinus rhythm, old anteroseptal infarct, nonspecific T-wave flattening, lateral T-wave inversions consistent with ischemia but unchanged from previous..  06/22/2016   Lab Results  Component Value Date   HGBA1C 10.1 12/05/2015     ASSESSMENT AND PLAN  CAD (coronary artery disease) -  Has had no new symptoms. He is tolerating medications.  We will continue with risk reduction.   He seems to be better with his lifestyle changes.  No other change in therapy is planned except as below.   Cardiomyopathy, ischemic -  The patient seems to be euvolemic. He's on a good medical regimen. No change in therapy is indicated.   Hyperlipidemia -  He stopped his Crestor as it made him nauseated.  He agrees to start Pravachol 40 mg daily.     DM - His last A1c was 10.1 and was 12.8 before this.   We will call [facility] and get him an appointment as this needs to be actively followed.   I will check an A1C

## 2016-06-22 ENCOUNTER — Encounter: Payer: Self-pay | Admitting: Cardiology

## 2016-06-22 ENCOUNTER — Ambulatory Visit (INDEPENDENT_AMBULATORY_CARE_PROVIDER_SITE_OTHER): Payer: Medicare Other | Admitting: Cardiology

## 2016-06-22 VITALS — BP 120/88 | HR 81 | Ht 71.0 in | Wt 172.0 lb

## 2016-06-22 DIAGNOSIS — E118 Type 2 diabetes mellitus with unspecified complications: Secondary | ICD-10-CM

## 2016-06-22 DIAGNOSIS — I2510 Atherosclerotic heart disease of native coronary artery without angina pectoris: Secondary | ICD-10-CM | POA: Diagnosis not present

## 2016-06-22 DIAGNOSIS — Z79899 Other long term (current) drug therapy: Secondary | ICD-10-CM

## 2016-06-22 DIAGNOSIS — E785 Hyperlipidemia, unspecified: Secondary | ICD-10-CM

## 2016-06-22 DIAGNOSIS — E1165 Type 2 diabetes mellitus with hyperglycemia: Secondary | ICD-10-CM

## 2016-06-22 MED ORDER — PRAVASTATIN SODIUM 40 MG PO TABS: 40.0000 mg | ORAL_TABLET | Freq: Every evening | ORAL | 11 refills | Status: DC

## 2016-06-22 NOTE — Patient Instructions (Addendum)
Medication Instructions:  START- Pravastatin 40 mg daily  Labwork: Hgb A1c today and Fasting Lipid and Liver in 8 weeks   Testing/Procedures: None Ordered  Follow-Up: Your physician wants you to follow-up in: 1 Year. You will receive a reminder letter in the mail two months in advance. If you don't receive a letter, please call our office to schedule the follow-up appointment.   Any Other Special Instructions Will Be Listed Below (If Applicable).        Happy Thanksgiving    If you need a refill on your cardiac medications before your next appointment, please call your pharmacy.

## 2016-06-23 LAB — HEMOGLOBIN A1C
Hgb A1c MFr Bld: 12.8 % — ABNORMAL HIGH (ref <5.7)
Mean Plasma Glucose: 321 mg/dL

## 2016-06-24 ENCOUNTER — Encounter: Payer: Self-pay | Admitting: Cardiology

## 2016-07-14 ENCOUNTER — Encounter: Payer: Self-pay | Admitting: *Deleted

## 2016-07-16 ENCOUNTER — Other Ambulatory Visit: Payer: Self-pay | Admitting: Internal Medicine

## 2016-07-17 NOTE — Telephone Encounter (Signed)
Pt would like a 90 day supply of amlodipine. Please advise. Thanks! ep

## 2016-07-23 ENCOUNTER — Other Ambulatory Visit: Payer: Self-pay | Admitting: Family Medicine

## 2016-07-23 ENCOUNTER — Other Ambulatory Visit: Payer: Self-pay | Admitting: Internal Medicine

## 2016-07-23 DIAGNOSIS — I10 Essential (primary) hypertension: Secondary | ICD-10-CM

## 2016-08-17 ENCOUNTER — Other Ambulatory Visit: Payer: Self-pay | Admitting: Family Medicine

## 2016-08-21 DIAGNOSIS — E785 Hyperlipidemia, unspecified: Secondary | ICD-10-CM | POA: Diagnosis not present

## 2016-08-21 DIAGNOSIS — Z79899 Other long term (current) drug therapy: Secondary | ICD-10-CM | POA: Diagnosis not present

## 2016-08-21 LAB — LIPID PANEL
Cholesterol: 136 mg/dL (ref <200)
HDL: 21 mg/dL — ABNORMAL LOW (ref >40)
LDL Cholesterol: 52 mg/dL (ref <100)
Total CHOL/HDL Ratio: 6.5 Ratio — ABNORMAL HIGH (ref <5.0)
Triglycerides: 314 mg/dL — ABNORMAL HIGH (ref <150)
VLDL: 63 mg/dL — ABNORMAL HIGH (ref <30)

## 2016-08-21 LAB — HEPATIC FUNCTION PANEL
ALT: 21 U/L (ref 9–46)
AST: 22 U/L (ref 10–40)
Albumin: 4.1 g/dL (ref 3.6–5.1)
Alkaline Phosphatase: 65 U/L (ref 40–115)
Bilirubin, Direct: 0.1 mg/dL (ref <=0.2)
Indirect Bilirubin: 0.4 mg/dL (ref 0.2–1.2)
Total Bilirubin: 0.5 mg/dL (ref 0.2–1.2)
Total Protein: 8.3 g/dL — ABNORMAL HIGH (ref 6.1–8.1)

## 2016-08-28 ENCOUNTER — Telehealth: Payer: Self-pay | Admitting: *Deleted

## 2016-08-28 DIAGNOSIS — E785 Hyperlipidemia, unspecified: Secondary | ICD-10-CM

## 2016-08-28 NOTE — Telephone Encounter (Signed)
Spoke with pt about his blood work, Direct LDL ordered for pt to get done..Marland Kitchen

## 2016-08-28 NOTE — Telephone Encounter (Signed)
-----   Message from Rollene RotundaJames Hochrein, MD sent at 08/23/2016  3:13 PM EST ----- Triglycerides are elevated .  I would like for him to come back for a direct LDL.   Call Mr. Laural BenesJohnson with the results and send results to Asiyah Angelene GiovanniZ Mikell, MD

## 2016-09-15 ENCOUNTER — Other Ambulatory Visit: Payer: Self-pay | Admitting: Family Medicine

## 2016-10-02 ENCOUNTER — Other Ambulatory Visit: Payer: Self-pay | Admitting: Internal Medicine

## 2016-10-02 DIAGNOSIS — I10 Essential (primary) hypertension: Secondary | ICD-10-CM

## 2016-10-05 NOTE — Telephone Encounter (Signed)
Please have patient make an appointment to be seen. Will refill for now.

## 2016-10-18 ENCOUNTER — Other Ambulatory Visit: Payer: Self-pay | Admitting: Internal Medicine

## 2016-10-21 NOTE — Telephone Encounter (Signed)
Spoke with patient, states he will call back to schedule an appointment.

## 2016-11-20 ENCOUNTER — Ambulatory Visit (INDEPENDENT_AMBULATORY_CARE_PROVIDER_SITE_OTHER): Payer: Medicare Other | Admitting: Internal Medicine

## 2016-11-20 VITALS — BP 118/72 | HR 87 | Temp 98.1°F | Ht 71.0 in | Wt 169.4 lb

## 2016-11-20 DIAGNOSIS — E1165 Type 2 diabetes mellitus with hyperglycemia: Secondary | ICD-10-CM | POA: Diagnosis not present

## 2016-11-20 DIAGNOSIS — E118 Type 2 diabetes mellitus with unspecified complications: Secondary | ICD-10-CM | POA: Diagnosis present

## 2016-11-20 DIAGNOSIS — I255 Ischemic cardiomyopathy: Secondary | ICD-10-CM

## 2016-11-20 DIAGNOSIS — I10 Essential (primary) hypertension: Secondary | ICD-10-CM

## 2016-11-20 DIAGNOSIS — I2510 Atherosclerotic heart disease of native coronary artery without angina pectoris: Secondary | ICD-10-CM | POA: Diagnosis not present

## 2016-11-20 LAB — POCT GLYCOSYLATED HEMOGLOBIN (HGB A1C): Hemoglobin A1C: 12.3

## 2016-11-20 MED ORDER — INSULIN GLARGINE 100 UNITS/ML SOLOSTAR PEN: PEN_INJECTOR | SUBCUTANEOUS | 11 refills | Status: DC

## 2016-11-20 MED ORDER — NITROGLYCERIN 0.4 MG SL SUBL: 0.4000 mg | SUBLINGUAL_TABLET | SUBLINGUAL | 11 refills | Status: DC | PRN

## 2016-11-20 MED ORDER — ISOSORBIDE MONONITRATE ER 30 MG PO TB24: 30.0000 mg | ORAL_TABLET | Freq: Every day | ORAL | 1 refills | Status: DC

## 2016-11-20 MED ORDER — METFORMIN HCL 1000 MG PO TABS: 1000.0000 mg | ORAL_TABLET | Freq: Two times a day (BID) | ORAL | 1 refills | Status: DC

## 2016-11-20 NOTE — Patient Instructions (Signed)
I want you to start taking Lantus. You can take this medication once at night. You can check your blood sugars once daily at breakfast time. If your blood sugars are elevated over 150 increase by 1 unit weekly. Follow-up with me in 1 month. I will try to fill out sure FMLA form, however may not be able to complete this form immediately. Consider an eye exam sometime in the future.

## 2016-11-20 NOTE — Progress Notes (Signed)
   [name] [name], MD Phone: [phone]  Reason For Visit: T2DM, HTN,  CAD    CHRONIC DM, Type 2: Has had uncontrolled diabetes for at least 3 years. Previously was on insulin however this was stopped and patient has never restarted this medication. Patient does not check his blood sugars he states that checking his blood sugars gives him a lot of anxiety. Patient seemed to be surprised when I told him his A1c was very uncontrolled. He has been taking glipizide 5 mg twice a day and metformin form and I thousand milligrams twice a day. Patient has been a diabetic for many years- for several years was controlled with an A1c of 7 and then 3 years ago had A1c is greater than 10 moving forward  Denies any any hypoglycemia episodes: Denies palpations, diaphoresis, fatigue, weakness, jittery Indicates polyuria, polydipsia   CHRONIC HTN: Refilled Norvasc  Denies CP, dyspnea, HA, edema, dizziness / lightheadedness  CAD - Follows with cardiology  - Most recent appointment about 2 weeks ago  Past Medical History Reviewed problem list.  Medications- reviewed and updated No additions to family history Social history- patient is a non- smoker  Objective: BP 118/72   Pulse 87   Temp 98.1 F (36.7 C) (Oral)   Ht  (1.803 m)   Wt 169 lb 6.4 oz (76.8 kg)   SpO2 100%   BMI 23.63 kg/m  Gen: NAD, alert, cooperative with exam Cardio: regular rate and rhythm, S1S2 heard, no murmurs appreciated Pulm: clear to auscultation bilaterally, no wheezes, rhonchi or rales  Diabetic Foot Exam - Simple   Simple Foot Form Diabetic Foot exam was performed with the following findings:  Yes 11/20/2016 11:00 AM  Visual Inspection No deformities, no ulcerations, no other skin breakdown bilaterally:  Yes See comments:  Yes Sensation Testing Intact to touch and monofilament testing bilaterally:  Yes See comments:  Yes Pulse Check Posterior Tibialis and Dorsalis pulse  intact bilaterally:  Yes Comments    Assessment/Plan: See problem based a/p   Hypertension Well controlled  - continue norvasc, quinpril-HCTZ  - Will obtain BMET  - 3 month follow up   CAD (coronary artery disease) CAD - Hx significant for stent placement, cardiomyopathy with an EF of 45% -  Negative for bilateral swelling in legs, no crackles on exam, no SOB, no orthopnea, negative for chest pain.  -  Last cardiology follow up about two weeks ago -  Continue coreg, imdur, and plavix  - Will continue to follow along with cardiology   Diabetes mellitus type 2, uncontrolled A1C 12.3 today,  A1C is elevated btw 11-12 for the last 3 years. Has previously been on insulin in the past and was well controlled. Most recently has been on glipizide 5 mg BID and metformin. Patient does not normally check blood sugars  - Will start patient on Lantus 10 units, patient confident that he can take this medication without issue-> patient to increase by 1 unit weekly if AM blood sugars above 150  - Stopped glipizide and continue metformin  - Patient with issues check blood sugars- anxiety associated with check blood sugars - discussed check at least AM blood sugar - Patient to call if blood sugars are below 90  - Follow up in 1 month

## 2016-11-21 LAB — BASIC METABOLIC PANEL
BUN/Creatinine Ratio: 13 (ref 9–20)
BUN: 15 mg/dL (ref 6–24)
CO2: 28 mmol/L (ref 18–29)
Calcium: 10.1 mg/dL (ref 8.7–10.2)
Chloride: 87 mmol/L — ABNORMAL LOW (ref 96–106)
Creatinine, Ser: 1.19 mg/dL (ref 0.76–1.27)
GFR calc Af Amer: 84 mL/min/{1.73_m2} (ref >59)
GFR calc non Af Amer: 73 mL/min/{1.73_m2} (ref >59)
Glucose: 363 mg/dL — ABNORMAL HIGH (ref 65–99)
Potassium: 4.2 mmol/L (ref 3.5–5.2)
Sodium: 133 mmol/L — ABNORMAL LOW (ref 134–144)

## 2016-11-25 ENCOUNTER — Ambulatory Visit: Payer: Medicare Other | Admitting: Internal Medicine

## 2016-11-25 NOTE — Assessment & Plan Note (Addendum)
A1C 12.3 today,  A1C is elevated btw 11-12 for the last 3 years. Has previously been on insulin in the past and was well controlled. Most recently has been on glipizide 5 mg BID and metformin. Patient does not normally check blood sugars  - Will start patient on Lantus 10 units, patient confident that he can take this medication without issue-> patient to increase by 1 unit weekly if AM blood sugars above 150  - Stopped glipizide and continue metformin  - Patient with issues check blood sugars- anxiety associated with check blood sugars - discussed check at least AM blood sugar - Patient to call if blood sugars are below 90  - Follow up in 1 month

## 2016-11-25 NOTE — Assessment & Plan Note (Addendum)
CAD - Hx significant for stent placement, cardiomyopathy with an EF of 45% -  Negative for bilateral swelling in legs, no crackles on exam, no SOB, no orthopnea, negative for chest pain.  -  Last cardiology follow up about two weeks ago -  Continue coreg, imdur, and plavix  - Will continue to follow along with cardiology - Would like me to fill out FMLA form

## 2016-11-25 NOTE — Assessment & Plan Note (Signed)
Well controlled  - continue norvasc, quinpril-HCTZ  - Will obtain BMET  - 3 month follow up

## 2016-11-27 ENCOUNTER — Telehealth: Payer: Self-pay | Admitting: Internal Medicine

## 2016-11-27 NOTE — Telephone Encounter (Signed)
Please let patient know that there is no medical signatures required by me, though I have helped fill in some the dates for him. Let him know he can pick up the documents at the front desk today

## 2016-11-27 NOTE — Telephone Encounter (Signed)
Pt informed. Deseree Blount, CMA  

## 2016-11-27 NOTE — Telephone Encounter (Signed)
Pt states he gave PCP FMLA papers at his last appointment on the 13th and would like to know when he can pick them up? ep

## 2017-01-17 ENCOUNTER — Other Ambulatory Visit: Payer: Self-pay | Admitting: Internal Medicine

## 2017-01-21 ENCOUNTER — Other Ambulatory Visit: Payer: Self-pay | Admitting: Internal Medicine

## 2017-03-16 DIAGNOSIS — I10 Essential (primary) hypertension: Secondary | ICD-10-CM | POA: Diagnosis not present

## 2017-03-16 DIAGNOSIS — S0101XA Laceration without foreign body of scalp, initial encounter: Secondary | ICD-10-CM | POA: Diagnosis not present

## 2017-03-16 DIAGNOSIS — S0990XA Unspecified injury of head, initial encounter: Secondary | ICD-10-CM | POA: Diagnosis not present

## 2017-03-16 DIAGNOSIS — E7800 Pure hypercholesterolemia, unspecified: Secondary | ICD-10-CM | POA: Diagnosis not present

## 2017-03-16 DIAGNOSIS — E119 Type 2 diabetes mellitus without complications: Secondary | ICD-10-CM | POA: Diagnosis not present

## 2017-03-16 DIAGNOSIS — T7611XA Adult physical abuse, suspected, initial encounter: Secondary | ICD-10-CM | POA: Diagnosis not present

## 2017-03-16 DIAGNOSIS — Z23 Encounter for immunization: Secondary | ICD-10-CM | POA: Diagnosis not present

## 2017-03-16 DIAGNOSIS — S0190XA Unspecified open wound of unspecified part of head, initial encounter: Secondary | ICD-10-CM | POA: Diagnosis not present

## 2017-03-27 DIAGNOSIS — Z4802 Encounter for removal of sutures: Secondary | ICD-10-CM | POA: Diagnosis not present

## 2017-03-27 DIAGNOSIS — I10 Essential (primary) hypertension: Secondary | ICD-10-CM | POA: Diagnosis not present

## 2017-03-27 DIAGNOSIS — Z7982 Long term (current) use of aspirin: Secondary | ICD-10-CM | POA: Diagnosis not present

## 2017-03-27 DIAGNOSIS — S0101XD Laceration without foreign body of scalp, subsequent encounter: Secondary | ICD-10-CM | POA: Diagnosis not present

## 2017-03-27 DIAGNOSIS — Z79899 Other long term (current) drug therapy: Secondary | ICD-10-CM | POA: Diagnosis not present

## 2017-03-27 DIAGNOSIS — E119 Type 2 diabetes mellitus without complications: Secondary | ICD-10-CM | POA: Diagnosis not present

## 2017-03-27 DIAGNOSIS — E7800 Pure hypercholesterolemia, unspecified: Secondary | ICD-10-CM | POA: Diagnosis not present

## 2017-03-27 DIAGNOSIS — Z7984 Long term (current) use of oral hypoglycemic drugs: Secondary | ICD-10-CM | POA: Diagnosis not present

## 2017-04-17 ENCOUNTER — Other Ambulatory Visit: Payer: Self-pay | Admitting: Internal Medicine

## 2017-05-02 ENCOUNTER — Other Ambulatory Visit: Payer: Self-pay | Admitting: Internal Medicine

## 2017-06-14 ENCOUNTER — Telehealth: Payer: Self-pay | Admitting: Internal Medicine

## 2017-06-14 DIAGNOSIS — I10 Essential (primary) hypertension: Secondary | ICD-10-CM

## 2017-06-14 NOTE — Telephone Encounter (Signed)
Will forward to MD, pharmacy changed in epic 

## 2017-06-14 NOTE — Telephone Encounter (Signed)
Pt is out of town and needs his Rx for isosorbide mononitrate send to the out of town pharmacy. The [facility] , address: [address] [place] [address], phone number: [phone].

## 2017-06-15 MED ORDER — INSULIN GLARGINE 100 UNITS/ML SOLOSTAR PEN: PEN_INJECTOR | SUBCUTANEOUS | 11 refills | Status: DC

## 2017-06-22 ENCOUNTER — Telehealth: Payer: Self-pay | Admitting: Internal Medicine

## 2017-06-22 DIAGNOSIS — I10 Essential (primary) hypertension: Secondary | ICD-10-CM

## 2017-06-22 MED ORDER — ISOSORBIDE MONONITRATE ER 30 MG PO TB24: 30.0000 mg | ORAL_TABLET | Freq: Every day | ORAL | 1 refills | Status: DC

## 2017-06-22 MED ORDER — INSULIN GLARGINE 100 UNITS/ML SOLOSTAR PEN: PEN_INJECTOR | SUBCUTANEOUS | 11 refills | Status: DC

## 2017-06-22 NOTE — Telephone Encounter (Signed)
Pt called again about his isosorbide being called into his new [facility] pharmacy that's listed below. He was very upset on the phone because he has not had this medication for over 2 weeks now and hes called multiple times. Pt would like to be called by dr or nurse about this situation. Please advise.

## 2017-06-22 NOTE — Telephone Encounter (Signed)
Filled IMDUR. Please that patient know.

## 2017-06-22 NOTE — Addendum Note (Signed)
Addended by: Noralee CharsMIKELL, Alieah Brinton Z on: 06/22/2017 04:57 PM   Modules accepted: Orders

## 2017-06-23 NOTE — Telephone Encounter (Signed)
Pt informed. Joscelin Fray, CMA  

## 2017-07-06 ENCOUNTER — Other Ambulatory Visit: Payer: Self-pay | Admitting: Internal Medicine

## 2017-07-06 DIAGNOSIS — F411 Generalized anxiety disorder: Secondary | ICD-10-CM

## 2017-07-06 MED ORDER — CITALOPRAM HYDROBROMIDE 20 MG PO TABS: 20.0000 mg | ORAL_TABLET | Freq: Two times a day (BID) | ORAL | 3 refills | Status: DC

## 2017-07-06 MED ORDER — HYDROXYZINE HCL 25 MG PO TABS: 25.0000 mg | ORAL_TABLET | Freq: Three times a day (TID) | ORAL | 0 refills | Status: DC | PRN

## 2017-07-06 NOTE — Telephone Encounter (Signed)
Sent in prescription. Patient needs to come in for a check up in the next couple of months.

## 2017-07-06 NOTE — Telephone Encounter (Signed)
Pt needs refills on hydroxyzine and citalopram faxed to [facility] [address] [place]. Their fax number is [phone]. His old pharmacy has closed down. Please advise

## 2017-07-07 NOTE — Telephone Encounter (Signed)
Pt informed and will call back to make an appt. Deseree Blount, CMA  

## 2017-07-15 ENCOUNTER — Telehealth: Payer: Self-pay | Admitting: Internal Medicine

## 2017-07-15 NOTE — Telephone Encounter (Signed)
Pt needs refill on clopidogrel sent to [facility] in [place] on [address] Please advise

## 2017-07-16 MED ORDER — CLOPIDOGREL BISULFATE 75 MG PO TABS: 75.0000 mg | ORAL_TABLET | Freq: Every day | ORAL | 3 refills | Status: DC

## 2017-07-16 NOTE — Addendum Note (Signed)
Addended by: Noralee CharsMIKELL, ASIYAH Z on: 07/16/2017 08:07 AM   Modules accepted: Orders

## 2017-07-22 NOTE — Telephone Encounter (Signed)
Cl[facility]ogrel needs to be [facility] to [facility] [place] on [address]

## 2017-07-27 ENCOUNTER — Other Ambulatory Visit: Payer: Self-pay | Admitting: Internal Medicine

## 2017-07-27 NOTE — Telephone Encounter (Signed)
Pt want all medications sent to this pharmacy now [facility]  [address] [place] [id number] Fax number: [phone]  Pt needs refill on clopidogrel sent to this pharmacy now. Please advise

## 2017-07-28 MED ORDER — CLOPIDOGREL BISULFATE 75 MG PO TABS: 75.0000 mg | ORAL_TABLET | Freq: Every day | ORAL | 3 refills | Status: DC

## 2017-07-28 NOTE — Telephone Encounter (Signed)
Refilled plavix 

## 2017-08-06 ENCOUNTER — Other Ambulatory Visit: Payer: Self-pay | Admitting: *Deleted

## 2017-08-06 MED ORDER — AMLODIPINE BESYLATE 5 MG PO TABS: 5.0000 mg | ORAL_TABLET | Freq: Every day | ORAL | 1 refills | Status: DC

## 2017-08-06 NOTE — Telephone Encounter (Signed)
Patient left message on nurse line requesting refill on amlodipine at [facility] in [place] on [address] Kinnie FeilL. Ducatte, RN, BSN

## 2017-09-03 ENCOUNTER — Other Ambulatory Visit: Payer: Self-pay | Admitting: *Deleted

## 2017-09-04 MED ORDER — METFORMIN HCL 1000 MG PO TABS: 1000.0000 mg | ORAL_TABLET | Freq: Two times a day (BID) | ORAL | 1 refills | Status: DC

## 2017-09-14 ENCOUNTER — Other Ambulatory Visit: Payer: Self-pay

## 2017-09-14 DIAGNOSIS — F411 Generalized anxiety disorder: Secondary | ICD-10-CM

## 2017-09-14 NOTE — Telephone Encounter (Signed)
Patient left message on nurse line requesting refill of [facility] to [facility] on [address] [place]. Ples SpecterAlisa Jiovanny Burdell, RN [facility] RN)

## 2017-09-15 MED ORDER — HYDROXYZINE HCL 25 MG PO TABS
25.0000 mg | ORAL_TABLET | Freq: Three times a day (TID) | ORAL | 0 refills | Status: DC | PRN
Start: 2017-09-15 — End: 2017-09-30

## 2017-09-30 ENCOUNTER — Ambulatory Visit (INDEPENDENT_AMBULATORY_CARE_PROVIDER_SITE_OTHER): Payer: Medicare Other | Admitting: Internal Medicine

## 2017-09-30 VITALS — BP 120/80 | HR 94 | Temp 97.8°F | Wt 161.8 lb

## 2017-09-30 DIAGNOSIS — E118 Type 2 diabetes mellitus with unspecified complications: Secondary | ICD-10-CM

## 2017-09-30 DIAGNOSIS — E785 Hyperlipidemia, unspecified: Secondary | ICD-10-CM | POA: Diagnosis not present

## 2017-09-30 DIAGNOSIS — E1165 Type 2 diabetes mellitus with hyperglycemia: Secondary | ICD-10-CM

## 2017-09-30 DIAGNOSIS — Z1159 Encounter for screening for other viral diseases: Secondary | ICD-10-CM | POA: Insufficient documentation

## 2017-09-30 DIAGNOSIS — I10 Essential (primary) hypertension: Secondary | ICD-10-CM | POA: Diagnosis not present

## 2017-09-30 DIAGNOSIS — I255 Ischemic cardiomyopathy: Secondary | ICD-10-CM | POA: Diagnosis not present

## 2017-09-30 DIAGNOSIS — I2510 Atherosclerotic heart disease of native coronary artery without angina pectoris: Secondary | ICD-10-CM | POA: Diagnosis not present

## 2017-09-30 DIAGNOSIS — Z114 Encounter for screening for human immunodeficiency virus [HIV]: Secondary | ICD-10-CM | POA: Diagnosis not present

## 2017-09-30 DIAGNOSIS — L209 Atopic dermatitis, unspecified: Secondary | ICD-10-CM

## 2017-09-30 LAB — POCT GLYCOSYLATED HEMOGLOBIN (HGB A1C): Hemoglobin A1C: >15

## 2017-09-30 MED ORDER — ISOSORBIDE MONONITRATE ER 30 MG PO TB24: 30.0000 mg | ORAL_TABLET | Freq: Every day | ORAL | 1 refills | Status: DC

## 2017-09-30 MED ORDER — CARVEDILOL 25 MG PO TABS: 25.0000 mg | ORAL_TABLET | Freq: Two times a day (BID) | ORAL | 2 refills | Status: DC

## 2017-09-30 MED ORDER — TRIAMCINOLONE ACETONIDE 0.1 % EX CREA: 1.0000 "application " | TOPICAL_CREAM | Freq: Two times a day (BID) | CUTANEOUS | 0 refills | Status: DC

## 2017-09-30 MED ORDER — AMLODIPINE BESYLATE 5 MG PO TABS: 5.0000 mg | ORAL_TABLET | Freq: Every day | ORAL | 1 refills | Status: DC

## 2017-09-30 MED ORDER — NITROGLYCERIN 0.4 MG SL SUBL
0.4000 mg | SUBLINGUAL_TABLET | SUBLINGUAL | 11 refills | Status: DC | PRN
Start: 2017-09-30 — End: 2018-10-04

## 2017-09-30 MED ORDER — NITROGLYCERIN 0.4 MG SL SUBL
0.4000 mg | SUBLINGUAL_TABLET | SUBLINGUAL | 11 refills | Status: DC | PRN
Start: 2017-09-30 — End: 2017-09-30

## 2017-09-30 MED ORDER — HYDRALAZINE HCL 25 MG PO TABS: 25.0000 mg | ORAL_TABLET | Freq: Three times a day (TID) | ORAL | 4 refills | Status: DC

## 2017-09-30 MED ORDER — CLOPIDOGREL BISULFATE 75 MG PO TABS: 75.0000 mg | ORAL_TABLET | Freq: Every day | ORAL | 3 refills | Status: DC

## 2017-09-30 MED ORDER — ASPIRIN 81 MG PO TBEC: 81.0000 mg | DELAYED_RELEASE_TABLET | Freq: Every day | ORAL | 11 refills | Status: DC

## 2017-09-30 MED ORDER — QUINAPRIL-HYDROCHLOROTHIAZIDE 20-25 MG PO TABS: 1.0000 | ORAL_TABLET | Freq: Every day | ORAL | 3 refills | Status: DC

## 2017-09-30 MED ORDER — ONETOUCH VERIO W/DEVICE KIT: 1.0000 | PACK | Freq: Once | 0 refills | Status: AC

## 2017-09-30 NOTE — Patient Instructions (Signed)
It was nice seeing you today.  Continue taking your Lantus and metformin.  Please make sure to follow-up in July as discussed.  Hopefully the doctor who sees you next can help you get your diabetes under even better control.

## 2017-09-30 NOTE — Progress Notes (Signed)
   [name] Paul CharsAsiyah Dawnn Nam, MD Phone: [phone]  Reason For Visit:   # CHRONIC DM, Type 2: Reports patient denies any concerns.  He states that his blood sugars are generally controlled. CBGs: Avg CBG 180 -200, CBG 500  Meds: Metformin 1000 mg , Lantus 15 units,  Reports good compliance. Tolerating well w/o side-effects Currently on ACEi / ARB  Any hypoglycemia episodes: Denies palpations, diaphoresis, fatigue, weakness, jittery Denies polyuria, visual changes Endorse signficant numbness or tingling.  #CAD  -Patient denies any significant chest pain, lower extremity swelling -He is currently on Plavix and Coreg he takes these consistently -Has not seen his cardiologist recently-has a follow-up as soon as he can.  He states he has had trouble with transportation.  Offered to provide him resources through social work.  He is not interested.  #HYPERLIPIDEMIA Disease Monitoring: See symptoms for Hypertension Medications: Takes pravastatin and aspirin Right upper quadrant pain- none Muscle aches- none   #Depression/Anxiety  -Indicates he is doing well on his current SSRI -He is currently been taking the Celexa daily.  He states he takes BuSpar only when he has trouble sleeping. -He denies any significant worry. -He denies any SI or significant sadness. -He states when he had  some financial troubles a couple months ago was he was very worried at that time. however since then things seem to have improved.  Past Medical History Reviewed problem list.  Medications- reviewed and updated No additions to family history Social history- patient is a non-smoker  Objective: BP 120/80 (BP Location: Right Arm, Patient Position: Sitting, Cuff Size: Normal)   Pulse 94   Temp 97.8 F (36.6 C) (Oral)   Wt 161 lb 12.8 oz (73.4 kg)   SpO2 (!) 89%   BMI 22.57 kg/m  Gen: NAD, alert, cooperative with exam Cardio: regular rate and rhythm, S1S2 heard, no murmurs  appreciated Pulm: clear to auscultation bilaterally, no wheezes, rhonchi or rales Extremities: warm, well perfused, No edema, cyanosis or clubbing;  MSK: Normal gait and station Skin: dry, intact, no rashes or lesions   Assessment/Plan: See problem based a/p  Diabetes mellitus type 2, uncontrolled Uncontrolled type 2 diabetes.  Last A1c greater than 15 patient has not followed up with me in over 6 months.  He states he has difficulty getting to these appointments due to transportation.  When I offered to see what resources we could provide this through social work he denied desire to do this.  He states in June his car will be fixed and he will be able to make it to appointments more consistently -Continue metformin at thousand milligrams twice daily -Currently taking Lantus 15 units -Will add on Jardiance 25 mg -Given patient cannot follow-up physically for the next 6 months, gust with patient that if he has any concerns or any hypoglycemic episodes to please give me a call -Follow-up as soon as possible  Hyperlipidemia LDL goal <100 Continue pravastatin Obtain lipid panel  CAD (coronary artery disease) History of significant CAD, cardiomyopathy with an EF of 45 -No concern for volume overload -Continue Coreg, Plavix, Imdur, hydralazine, quinapril -Follow-up with cardiology soon as possible   Hypertension Blood pressure well controlled

## 2017-10-01 LAB — LIPID PANEL
Chol/HDL Ratio: 5.8 ratio — ABNORMAL HIGH (ref 0.0–5.0)
Cholesterol, Total: 127 mg/dL (ref 100–199)
HDL: 22 mg/dL — ABNORMAL LOW (ref >39)
LDL Calculated: 35 mg/dL (ref 0–99)
Triglycerides: 352 mg/dL — ABNORMAL HIGH (ref 0–149)
VLDL Cholesterol Cal: 70 mg/dL — ABNORMAL HIGH (ref 5–40)

## 2017-10-01 LAB — HIV ANTIBODY (ROUTINE TESTING W REFLEX): HIV Screen 4th Generation wRfx: NONREACTIVE

## 2017-10-04 ENCOUNTER — Telehealth: Payer: Self-pay | Admitting: Internal Medicine

## 2017-10-04 DIAGNOSIS — E1165 Type 2 diabetes mellitus with hyperglycemia: Secondary | ICD-10-CM

## 2017-10-04 MED ORDER — EMPAGLIFLOZIN 25 MG PO TABS: 25.0000 mg | ORAL_TABLET | Freq: Every day | ORAL | 0 refills | Status: DC

## 2017-10-04 NOTE — Telephone Encounter (Signed)
Called patient regarding his A1c. patient with severely elevated A1c.  Currently on Lantus 15 mg and metformin thousand milligrams twice daily.  Will start him on Jardiance.  Patient is not able to follow-up with me for the next 6 months.  Therefore I have told him to call me with any concerns about low blood sugars.  Follow-up with provider as soon as he can

## 2017-10-05 MED ORDER — PRAVASTATIN SODIUM 40 MG PO TABS: 40.0000 mg | ORAL_TABLET | Freq: Every evening | ORAL | 11 refills | Status: DC

## 2017-10-05 NOTE — Assessment & Plan Note (Signed)
History of significant CAD, cardiomyopathy with an EF of 45 -No concern for volume overload -Continue Coreg, Plavix, Imdur, hydralazine, quinapril -Follow-up with cardiology soon as possible

## 2017-10-05 NOTE — Assessment & Plan Note (Signed)
Uncontrolled type 2 diabetes.  Last A1c greater than 15 patient has not followed up with me in over 6 months.  He states he has difficulty getting to these appointments due to transportation.  When I offered to see what resources we could provide this through social work he denied desire to do this.  He states in June his car will be fixed and he will be able to make it to appointments more consistently -Continue metformin at thousand milligrams twice daily -Currently taking Lantus 15 units -Will add on Jardiance 25 mg -Given patient cannot follow-up physically for the next 6 months, gust with patient that if he has any concerns or any hypoglycemic episodes to please give me a call -Follow-up as soon as possible

## 2017-10-05 NOTE — Assessment & Plan Note (Addendum)
Continue pravastatin Obtain lipid panel

## 2017-10-05 NOTE — Assessment & Plan Note (Signed)
Blood pressure well controlled

## 2017-12-09 ENCOUNTER — Encounter: Payer: Self-pay | Admitting: Internal Medicine

## 2017-12-09 ENCOUNTER — Telehealth: Payer: Self-pay

## 2017-12-09 NOTE — Telephone Encounter (Signed)
Called patient. Sent letter in the mail per patient request.

## 2017-12-09 NOTE — Telephone Encounter (Signed)
Patient left message on nurse line that he needs a statement/letter from PCP stating that his A1c was greater than 15. He would like to speak to PCP to explain.  Call back is [phone].   Ples Specter, RN [facility] RN)

## 2017-12-13 ENCOUNTER — Telehealth: Payer: Self-pay | Admitting: Internal Medicine

## 2017-12-13 DIAGNOSIS — E1165 Type 2 diabetes mellitus with hyperglycemia: Secondary | ICD-10-CM

## 2017-12-13 NOTE — Telephone Encounter (Signed)
Pt needs another letter from dr Cathlean Cower stating his condition will not get any better and he needs his medicines and his isosorb-mono 30 mg tablets.  He also takes jardinace 25 mg.  He needs these medcines in order to breathe

## 2017-12-14 ENCOUNTER — Other Ambulatory Visit: Payer: Self-pay | Admitting: Internal Medicine

## 2017-12-14 DIAGNOSIS — E1165 Type 2 diabetes mellitus with hyperglycemia: Secondary | ICD-10-CM

## 2017-12-14 MED ORDER — EMPAGLIFLOZIN 25 MG PO TABS: 25.0000 mg | ORAL_TABLET | Freq: Every day | ORAL | 0 refills | Status: DC

## 2017-12-14 NOTE — Telephone Encounter (Signed)
Patient's PCP Dr. Cathlean Cower sent letter 12/09/17. Patient already has refills of imdur through the summer; this should be available at his pharmacy. Placed refill order for jardiance. Please inform patient.  Dani Gobble, MD [facility], PGY-3

## 2017-12-15 NOTE — Telephone Encounter (Signed)
Pt informed. Deseree Blount, CMA  

## 2018-01-04 ENCOUNTER — Telehealth: Payer: Self-pay | Admitting: Internal Medicine
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
## 2018-01-10 ENCOUNTER — Encounter: Payer: Self-pay | Admitting: Internal Medicine

## 2018-01-10 ENCOUNTER — Other Ambulatory Visit: Payer: Self-pay

## 2018-01-10 ENCOUNTER — Ambulatory Visit (INDEPENDENT_AMBULATORY_CARE_PROVIDER_SITE_OTHER): Payer: Medicare Other | Admitting: Internal Medicine

## 2018-01-10 VITALS — BP 94/68 | HR 115 | Temp 98.4°F | Ht 71.0 in | Wt 155.0 lb

## 2018-01-10 DIAGNOSIS — I255 Ischemic cardiomyopathy: Secondary | ICD-10-CM | POA: Diagnosis not present

## 2018-01-10 DIAGNOSIS — I10 Essential (primary) hypertension: Secondary | ICD-10-CM | POA: Diagnosis not present

## 2018-01-10 DIAGNOSIS — E1165 Type 2 diabetes mellitus with hyperglycemia: Secondary | ICD-10-CM

## 2018-01-10 DIAGNOSIS — E785 Hyperlipidemia, unspecified: Secondary | ICD-10-CM

## 2018-01-10 DIAGNOSIS — E119 Type 2 diabetes mellitus without complications: Secondary | ICD-10-CM | POA: Diagnosis not present

## 2018-01-10 DIAGNOSIS — E1142 Type 2 diabetes mellitus with diabetic polyneuropathy: Secondary | ICD-10-CM

## 2018-01-10 DIAGNOSIS — E1140 Type 2 diabetes mellitus with diabetic neuropathy, unspecified: Secondary | ICD-10-CM | POA: Insufficient documentation

## 2018-01-10 LAB — POCT GLYCOSYLATED HEMOGLOBIN (HGB A1C): HbA1c, POC (controlled diabetic range): 13 % — AB (ref 0.0–7.0)

## 2018-01-10 MED ORDER — METFORMIN HCL 1000 MG PO TABS: 1000.0000 mg | ORAL_TABLET | Freq: Two times a day (BID) | ORAL | 1 refills | Status: DC

## 2018-01-10 MED ORDER — GABAPENTIN 100 MG PO CAPS: 100.0000 mg | ORAL_CAPSULE | Freq: Three times a day (TID) | ORAL | 3 refills | Status: DC

## 2018-01-10 MED ORDER — EMPAGLIFLOZIN 25 MG PO TABS: 25.0000 mg | ORAL_TABLET | Freq: Every day | ORAL | 0 refills | Status: DC

## 2018-01-10 MED ORDER — HYDRALAZINE HCL 10 MG PO TABS: 10.0000 mg | ORAL_TABLET | Freq: Three times a day (TID) | ORAL | 1 refills | Status: DC

## 2018-01-10 MED ORDER — INSULIN GLARGINE 100 UNITS/ML SOLOSTAR PEN: PEN_INJECTOR | SUBCUTANEOUS | 11 refills | Status: DC

## 2018-01-10 MED ORDER — GLUCOSE BLOOD VI STRP: ORAL_STRIP | 5 refills | Status: AC

## 2018-01-10 MED ORDER — PRAVASTATIN SODIUM 40 MG PO TABS: 40.0000 mg | ORAL_TABLET | Freq: Every evening | ORAL | 11 refills | Status: DC

## 2018-01-10 NOTE — Assessment & Plan Note (Signed)
Gabapentin as needed for neuropathy

## 2018-01-10 NOTE — Assessment & Plan Note (Signed)
Continue Pravastatin  Check Lipid Panel

## 2018-01-10 NOTE — Patient Instructions (Addendum)
Please follow up Dr. Raymondo BandKoval in the next two weeks- to discuss better control of diabetes. I am going increase your Lantus to 25 units every morning. I am going to stop the amlodopine and reduce the hydralazine as your blood pressure are low. Please follow up in the next 2-3. Please watch your hydration status

## 2018-01-10 NOTE — Assessment & Plan Note (Signed)
Blood sugars not well controlled, A1c 13 at this visit previously greater than 15, does not check his blood sugars states he has a lot of financial and transport barriers associated with his health care.  Discussed with patient following with social work. He states he is currently not interested -Increased patient's Lantus from 15 to 25 units -Continue Jardiance and metformin - HgB A1c - insulin glargine (LANTUS) 100 unit/mL SOPN; Take 25 units daily  Dispense: 25 mL; Refill: 11 - empagliflozin (JARDIANCE) 25 MG TABS tablet; Take 25 mg by mouth daily.  Dispense: 90 tablet; Refill: 0 - Basic Metabolic Panel - glucose blood (ONE TOUCH ULTRA TEST) test strip; Check sugar once daily in the morning. ICD10 E11.65  Dispense: 30 each; Refill: 5 -Patient to follow-up with Dr. Raymondo BandKoval in the diabetic clinic in the next 2 to 3 weeks

## 2018-01-10 NOTE — Assessment & Plan Note (Signed)
Blood pressure is on the lower side, slightly tachycardic -Given patient's blood sugars are elevated and the fact that he indicates he has been dehydrated for the past couple of days -likely contributing to his low blood pressures and tachycardia -We will decrease hydralazine to 10 mg from 25, will stop Norvasc -Currently continuing Accuretic, Coreg and IMDUR  -Ideally I would have patient follow-up with me in 3 to 5 days to recheck his blood pressure-he states he will not be able to do this discussed he will self check his blood pressure and -will consider decreasing his Imdur if it is still low - Basic Metabolic Panel

## 2018-01-10 NOTE — Progress Notes (Signed)
   [facility] Noralee CharsAsiyah Almadelia Looman, MD Phone: [phone]  Reason For Visit: Follow-up  # CHRONIC DM, Type 2: CBGs: Has  Not been check his blood sugar Meds: Lantus 15 units, Jardiance and metformin Reports good compliance. Tolerating well w/o side-effects Currently on ACEi / ARB  Lifestyle: Diet eating lunch meat for two weeks, bread, Any hypoglycemia episodes: Denies palpations, diaphoresis, fatigue, weakness, jittery Denies polyuria, visual changes, numbness or tingling.  CHRONIC HTN: Current Meds - Accuretic, IMDUR, Hydralazine, coreg, norvasc Reports good compliance, took meds today. Tolerating well, w/o complaints. Denies CP, dyspnea, HA, edema, dizziness / lightheadedness Endorse having SOB he exerts himself significantly  HYPERLIPIDEMIA Disease Monitoring: See symptoms for Hypertension Medications: Pravastatin  Past Medical History Reviewed problem list.  Medications- reviewed and updated No additions to family history Social history- patient is a non smoker  Objective: BP 94/68   Pulse (!) 115   Temp 98.4 F (36.9 C) (Oral)   Ht 5\' 11"  (1.803 m)   Wt 155 lb (70.3 kg)   SpO2 96%   BMI 21.62 kg/m  Gen: NAD, alert, cooperative with exam Cardio: regular rate and rhythm, S1S2 heard, no murmurs appreciated Pulm: clear to auscultation bilaterally, no wheezes, rhonchi or rales Extremities: warm, well perfused, No edema, cyanosis or clubbing;  Skin: dry, intact, no rashes or lesions    Assessment/Plan: See problem based a/p  Hypertension Blood pressure is on the lower side, slightly tachycardic -Given patient's blood sugars are elevated and the fact that he indicates he has been dehydrated for the past couple of days -likely contributing to his low blood pressures and tachycardia -We will decrease hydralazine to 10 mg from 25, will stop Norvasc -Currently continuing Accuretic, Coreg and IMDUR  -Ideally I would have patient follow-up with me  in 3 to 5 days to recheck his blood pressure-he states he will not be able to do this discussed he will self check his blood pressure and -will consider decreasing his Imdur if it is still low - Basic Metabolic Panel    Hyperlipidemia LDL goal <100 Continue Pravastatin  Check Lipid Panel   Diabetic neuropathy (HCC) Gabapentin as needed for neuropathy   Diabetes mellitus type 2, uncontrolled Blood sugars not well controlled, A1c 13 at this visit previously greater than 15, does not check his blood sugars states he has a lot of financial and transport barriers associated with his health care.  Discussed with patient following with social work. He states he is currently not interested -Increased patient's Lantus from 15 to 25 units -Continue Jardiance and metformin - HgB A1c - insulin glargine (LANTUS) 100 unit/mL SOPN; Take 25 units daily  Dispense: 25 mL; Refill: 11 - empagliflozin (JARDIANCE) 25 MG TABS tablet; Take 25 mg by mouth daily.  Dispense: 90 tablet; Refill: 0 - Basic Metabolic Panel - glucose blood (ONE TOUCH ULTRA TEST) test strip; Check sugar once daily in the morning. ICD10 E11.65  Dispense: 30 each; Refill: 5 -Patient to follow-up with Dr. Raymondo BandKoval in the diabetic clinic in the next 2 to 3 weeks

## 2018-01-11 ENCOUNTER — Telehealth: Payer: Self-pay | Admitting: Cardiology

## 2018-01-11 LAB — BASIC METABOLIC PANEL
BUN/Creatinine Ratio: 10 (ref 9–20)
BUN: 12 mg/dL (ref 6–24)
CO2: 26 mmol/L (ref 20–29)
Calcium: 9.7 mg/dL (ref 8.7–10.2)
Chloride: 93 mmol/L — ABNORMAL LOW (ref 96–106)
Creatinine, Ser: 1.17 mg/dL (ref 0.76–1.27)
GFR calc Af Amer: 85 mL/min/{1.73_m2} (ref >59)
GFR calc non Af Amer: 74 mL/min/{1.73_m2} (ref >59)
Glucose: 224 mg/dL — ABNORMAL HIGH (ref 65–99)
Potassium: 4.7 mmol/L (ref 3.5–5.2)
Sodium: 137 mmol/L (ref 134–144)

## 2018-01-11 LAB — LIPID PANEL
Chol/HDL Ratio: 5.9 ratio — ABNORMAL HIGH (ref 0.0–5.0)
Cholesterol, Total: 141 mg/dL (ref 100–199)
HDL: 24 mg/dL — ABNORMAL LOW (ref >39)
LDL Calculated: 72 mg/dL (ref 0–99)
Triglycerides: 226 mg/dL — ABNORMAL HIGH (ref 0–149)
VLDL Cholesterol Cal: 45 mg/dL — ABNORMAL HIGH (ref 5–40)
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
## 2018-01-11 NOTE — Telephone Encounter (Signed)
Pt has not seen Dr Antoine PocheHochrein since 2017, appt was made by pt for 06/28 @ 11:00 am.

## 2018-01-11 NOTE — Telephone Encounter (Signed)
New Message   Pt is requesting a letter for disability

## 2018-01-12 ENCOUNTER — Encounter: Payer: Self-pay | Admitting: Internal Medicine

## 2018-01-13 ENCOUNTER — Encounter: Payer: Self-pay | Admitting: Cardiology
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
## 2018-01-14 ENCOUNTER — Encounter: Payer: Self-pay | Admitting: Cardiology

## 2018-01-14 ENCOUNTER — Ambulatory Visit (INDEPENDENT_AMBULATORY_CARE_PROVIDER_SITE_OTHER): Payer: Medicare Other | Admitting: Cardiology

## 2018-01-14 VITALS — BP 125/85 | HR 102 | Ht 71.0 in | Wt 158.8 lb

## 2018-01-14 DIAGNOSIS — I429 Cardiomyopathy, unspecified: Secondary | ICD-10-CM | POA: Diagnosis not present

## 2018-01-14 DIAGNOSIS — I2510 Atherosclerotic heart disease of native coronary artery without angina pectoris: Secondary | ICD-10-CM | POA: Diagnosis not present

## 2018-01-14 DIAGNOSIS — E118 Type 2 diabetes mellitus with unspecified complications: Secondary | ICD-10-CM

## 2018-01-14 DIAGNOSIS — I255 Ischemic cardiomyopathy: Secondary | ICD-10-CM

## 2018-01-14 NOTE — Patient Instructions (Signed)

## 2018-01-14 NOTE — Telephone Encounter (Signed)
Pt has an appt with Dr Antoine PocheHochrein today, letter written and given to pt

## 2018-02-04 ENCOUNTER — Ambulatory Visit: Payer: Medicare Other | Admitting: Cardiology

## 2018-02-04 ENCOUNTER — Ambulatory Visit (HOSPITAL_COMMUNITY): Payer: Medicare Other | Attending: Cardiovascular Disease

## 2018-02-04 ENCOUNTER — Other Ambulatory Visit: Payer: Self-pay

## 2018-02-04 DIAGNOSIS — E119 Type 2 diabetes mellitus without complications: Secondary | ICD-10-CM | POA: Diagnosis not present

## 2018-02-04 DIAGNOSIS — I10 Essential (primary) hypertension: Secondary | ICD-10-CM | POA: Insufficient documentation

## 2018-02-04 DIAGNOSIS — I371 Nonrheumatic pulmonary valve insufficiency: Secondary | ICD-10-CM | POA: Insufficient documentation

## 2018-02-04 DIAGNOSIS — I2510 Atherosclerotic heart disease of native coronary artery without angina pectoris: Secondary | ICD-10-CM | POA: Diagnosis not present

## 2018-02-04 DIAGNOSIS — I255 Ischemic cardiomyopathy: Secondary | ICD-10-CM | POA: Insufficient documentation

## 2018-02-04 DIAGNOSIS — I071 Rheumatic tricuspid insufficiency: Secondary | ICD-10-CM | POA: Insufficient documentation

## 2018-02-04 DIAGNOSIS — E785 Hyperlipidemia, unspecified: Secondary | ICD-10-CM | POA: Diagnosis not present

## 2018-02-04 DIAGNOSIS — I429 Cardiomyopathy, unspecified: Secondary | ICD-10-CM

## 2018-02-04 MED ORDER — PERFLUTREN LIPID MICROSPHERE
1.0000 mL | INTRAVENOUS | Status: AC | PRN
  Administered 2018-02-04: 2 mL via INTRAVENOUS

## 2018-05-14 DIAGNOSIS — Z23 Encounter for immunization: Secondary | ICD-10-CM | POA: Diagnosis not present

## 2018-05-16 ENCOUNTER — Other Ambulatory Visit: Payer: Self-pay | Admitting: Internal Medicine

## 2018-05-16 DIAGNOSIS — E1165 Type 2 diabetes mellitus with hyperglycemia: Secondary | ICD-10-CM

## 2018-06-17 ENCOUNTER — Ambulatory Visit (INDEPENDENT_AMBULATORY_CARE_PROVIDER_SITE_OTHER): Payer: Medicare Other | Admitting: Family Medicine

## 2018-06-17 VITALS — BP 120/80 | Temp 98.0°F | Wt 162.4 lb

## 2018-06-17 DIAGNOSIS — E1165 Type 2 diabetes mellitus with hyperglycemia: Secondary | ICD-10-CM | POA: Diagnosis not present

## 2018-06-17 DIAGNOSIS — I255 Ischemic cardiomyopathy: Secondary | ICD-10-CM

## 2018-06-17 LAB — POCT GLYCOSYLATED HEMOGLOBIN (HGB A1C): HbA1c, POC (controlled diabetic range): 12.2 % — AB (ref 0.0–7.0)

## 2018-06-17 MED ORDER — LIRAGLUTIDE 18 MG/3ML ~~LOC~~ SOPN: 0.6000 mg | PEN_INJECTOR | Freq: Every day | SUBCUTANEOUS | 3 refills | Status: DC

## 2018-06-17 NOTE — Patient Instructions (Addendum)
It was great to meet you today! Thank you for letting me participate in your care!  Today, we discussed your uncontrolled diabetes. I have started you on a new medication called Liraglutide. Please start by using 0.61mL once daily for one week, then increase the dose to 1.1mL once daily. It will be important to CONTINUE taking Metformin, Jardiance, and Lantus as you have previously been doing. Please do the following  1) Check your blood sugar once every morning and keep a log. Bring it with you to your next appointment.  2) Stop drinking sodas 3) Try to exercise for at least 30 minutes a day for 5 times per week  Be well, Jules Schick, DO PGY-2, [facility]

## 2018-06-17 NOTE — Progress Notes (Signed)
Subjective: Chief Complaint  Patient presents with  . Diabetes   HPI: Paul [name] is a 47 y.o. presenting to clinic today to discuss the following:  DM Management Patient is here for a routine check up for his T2DM. He is not regularly checking his blood sugar every morning. He is on 25U of Lantus in the morning, 1000mg  of Metformin BID, and Jardiance 25mg  daily. He continues to drink soft drinks and other sugary drinks and he does not monitor his diet closely. As a result, his A1c today is 12.2.   We discussed that is DM is not well controlled and the health risks that this poses for him. He expressed understanding and agreed to begin checking his blood sugar in the morning before breakfast every morning. He will attempt to stop drinking sodas. I informed him he must stop consuming the amounts of sugar he currently is taking or he will begin to suffer the long term consequences of having uncontrolled DM.  He denies any urinary frequency or pain, no increased thirst, no significant weight gain.  He has no abdominal pain, early satiety, no nausea or vomiting.  Health Maintenance: referral opthamology, foot exam     ROS noted in HPI.   Past Medical, Surgical, Social, and Family History Reviewed & Updated per EMR.   Pertinent Historical Findings include:   Social History   Tobacco Use  Smoking Status Never Smoker  Smokeless Tobacco Never Used   Objective: BP 120/80   Temp 98 F (36.7 C) (Oral)   Wt 162 lb 6.4 oz (73.7 kg)   BMI 22.65 kg/m  Vitals and nursing notes reviewed  Physical Exam Gen: Alert and Oriented x 3, NAD HEENT: Normocephalic, atraumatic Neck: trachea midline, no thyroidmegaly, no LAD CV: RRR, no murmurs, normal S1, S2 split Resp: CTAB, no wheezing, rales, or rhonchi, comfortable work of breathing Ext: no clubbing, cyanosis, or edema; diabetic foot exam performed see quality metrics Neuro: No gross deficits, gross sensation intact in the upper  and lower extremities Skin: warm, dry, intact, no rashes  Results for orders placed or performed in visit on 06/17/18 (from the past 72 hour(s))  HgB A1c     Status: Abnormal   Collection Time: 06/17/18  1:35 PM  Result Value Ref Range   Hemoglobin A1C     HbA1c POC (<> result, manual entry)     HbA1c, POC (prediabetic range)     HbA1c, POC (controlled diabetic range) 12.2 (A) 0.0 - 7.0 %    Assessment/Plan:  Diabetes mellitus type 2, uncontrolled Remains poorly controlled. Patient will attempt lifestyle modifications that will help bring his A1c under better control. In the meantime, given his elevated A1c I will make medication adjustments today and attempt to get better control. Discussed with patient the need to follow this closely. - Patient will increase insulin (Lantus) by 1 unit every morning he checks his blood sugar and it is over 160. - Starting Liraglutide at 0.6mg  daily for one week, then increasing to effective dose of 1.2mg  daily - Cont Metformin 1000mg  BID and Jardiance 25mg  daily - Follow up in 4 weeks  PATIENT EDUCATION PROVIDED: See AVS    Diagnosis and plan along with any newly prescribed medication(s) were discussed in detail with this patient today. The patient verbalized understanding and agreed with the plan. Patient advised if symptoms worsen return to clinic or ER.   Health Maintainance: foot exam performed today, referral for diabetic eye exam today  Orders Placed This Encounter  Procedures  . HgB A1c    Meds ordered this encounter  Medications  . liraglutide (VICTOZA) 18 MG/3ML SOPN    Sig: Inject 0.1 mLs (0.6 mg total) into the skin daily. Inject 1.13mL into the skin daily after taking for one week.    Dispense:  3 mL    Refill:  3     Jules Schick, DO 06/17/2018, 1:49 PM PGY-2 [facility]

## 2018-06-22 ENCOUNTER — Encounter: Payer: Self-pay | Admitting: Family Medicine

## 2018-06-22 NOTE — Assessment & Plan Note (Signed)
Remains poorly controlled. Patient will attempt lifestyle modifications that will help bring his A1c under better control. In the meantime, given his elevated A1c I will make medication adjustments today and attempt to get better control. Discussed with patient the need to follow this closely. - Patient will increase insulin (Lantus) by 1 unit every morning he checks his blood sugar and it is over 160. - Starting Liraglutide at 0.6mg  daily for one week, then increasing to effective dose of 1.2mg  daily - Cont Metformin 1000mg  BID and Jardiance 25mg  daily - Follow up in 4 weeks

## 2018-09-19 ENCOUNTER — Other Ambulatory Visit: Payer: Self-pay | Admitting: *Deleted

## 2018-09-19 DIAGNOSIS — E1165 Type 2 diabetes mellitus with hyperglycemia: Secondary | ICD-10-CM

## 2018-09-19 DIAGNOSIS — F411 Generalized anxiety disorder: Secondary | ICD-10-CM

## 2018-09-19 MED ORDER — CITALOPRAM HYDROBROMIDE 20 MG PO TABS: 20.0000 mg | ORAL_TABLET | Freq: Two times a day (BID) | ORAL | 3 refills | Status: DC

## 2018-09-19 MED ORDER — EMPAGLIFLOZIN 25 MG PO TABS: 25.0000 mg | ORAL_TABLET | Freq: Every day | ORAL | 0 refills | Status: DC

## 2018-09-19 MED ORDER — PRAVASTATIN SODIUM 40 MG PO TABS: 40.0000 mg | ORAL_TABLET | Freq: Every evening | ORAL | 3 refills | Status: DC

## 2018-09-19 MED ORDER — METFORMIN HCL 1000 MG PO TABS: ORAL_TABLET | ORAL | 3 refills | Status: DC

## 2018-09-20 ENCOUNTER — Other Ambulatory Visit: Payer: Self-pay | Admitting: *Deleted

## 2018-09-20 DIAGNOSIS — I10 Essential (primary) hypertension: Secondary | ICD-10-CM

## 2018-09-21 MED ORDER — ISOSORBIDE MONONITRATE ER 30 MG PO TB24: 30.0000 mg | ORAL_TABLET | Freq: Every day | ORAL | 1 refills | Status: DC

## 2018-09-30 ENCOUNTER — Telehealth: Payer: Self-pay

## 2018-09-30 NOTE — Telephone Encounter (Signed)
Pharmacist at [facility] has questions about directions on Victoza.  Call back is [phone]  Ples Specter, RN [facility] RN)

## 2018-10-03 ENCOUNTER — Other Ambulatory Visit: Payer: Self-pay | Admitting: Family Medicine

## 2018-10-03 DIAGNOSIS — Z23 Encounter for immunization: Secondary | ICD-10-CM | POA: Diagnosis not present

## 2018-10-03 MED ORDER — LIRAGLUTIDE 18 MG/3ML ~~LOC~~ SOPN: 0.6000 mg | PEN_INJECTOR | Freq: Every day | SUBCUTANEOUS | 3 refills | Status: DC

## 2018-10-03 NOTE — Telephone Encounter (Signed)
[facility] called again in regards to Victoza prescription.  Per pharmacist, the rx was written for 1.86ml, it should be written for 1.2mg . Please send in new rx. Or call with verbal.

## 2018-10-03 NOTE — Progress Notes (Signed)
Correcting typo on reorder of medication

## 2018-10-04 ENCOUNTER — Other Ambulatory Visit: Payer: Self-pay

## 2018-10-04 DIAGNOSIS — I255 Ischemic cardiomyopathy: Secondary | ICD-10-CM

## 2018-10-04 MED ORDER — CARVEDILOL 25 MG PO TABS: 25.0000 mg | ORAL_TABLET | Freq: Two times a day (BID) | ORAL | 2 refills | Status: DC

## 2018-10-04 MED ORDER — NITROGLYCERIN 0.4 MG SL SUBL: 0.4000 mg | SUBLINGUAL_TABLET | SUBLINGUAL | 11 refills | Status: DC | PRN

## 2018-10-25 ENCOUNTER — Ambulatory Visit: Payer: Medicare Other | Admitting: Family Medicine

## 2018-11-08 ENCOUNTER — Ambulatory Visit: Payer: Medicare Other | Admitting: Family Medicine

## 2018-11-28 ENCOUNTER — Telehealth: Payer: Self-pay | Admitting: Cardiology

## 2018-11-28 NOTE — Telephone Encounter (Signed)
Spoke with pt to let him know he need to drop off FMLA paperwork at the office to Medical records, pt voice understanding and thanks

## 2018-11-28 NOTE — Telephone Encounter (Signed)
New Message            Patient called today to get an appointment tor fill out a disability form. I was not sure what type of an appointment he would need. Pls call to advise.  [phone]

## 2018-11-28 NOTE — Telephone Encounter (Signed)
Follow up    Pt is returning call and wanting someone to call him back    Please call

## 2018-11-29 ENCOUNTER — Telehealth: Payer: Self-pay | Admitting: Family Medicine

## 2018-11-29 ENCOUNTER — Other Ambulatory Visit: Payer: Self-pay

## 2018-11-29 ENCOUNTER — Ambulatory Visit (INDEPENDENT_AMBULATORY_CARE_PROVIDER_SITE_OTHER): Payer: Medicare Other | Admitting: Family Medicine

## 2018-11-29 VITALS — BP 92/62 | HR 81 | Temp 98.6°F | Ht 71.0 in | Wt 163.4 lb

## 2018-11-29 DIAGNOSIS — F4310 Post-traumatic stress disorder, unspecified: Secondary | ICD-10-CM

## 2018-11-29 DIAGNOSIS — I255 Ischemic cardiomyopathy: Secondary | ICD-10-CM

## 2018-11-29 MED ORDER — TRAZODONE HCL 50 MG PO TABS: 50.0000 mg | ORAL_TABLET | Freq: Every evening | ORAL | 0 refills | Status: DC | PRN

## 2018-11-29 MED ORDER — ARIPIPRAZOLE 5 MG PO TABS: 5.0000 mg | ORAL_TABLET | Freq: Every day | ORAL | 0 refills | Status: DC

## 2018-11-29 NOTE — Telephone Encounter (Signed)
Social Security  form dropped off for at front desk for completion.  Verified that patient section of form has been completed.  Last DOS/WCC with PCP was11/18/19  Placed form in team folder to be completed by clinical staff.  Paul [name]

## 2018-11-29 NOTE — Telephone Encounter (Signed)
Clinical info completed on SOCIAL SECURITY form.  Place form in PCP's box for completion.  Aquilla Solian, CMA

## 2018-11-29 NOTE — Telephone Encounter (Signed)
Placed in mail to [id number] as requested by pt.  Copy made for batch scanning. Jone Baseman, CMA

## 2018-11-29 NOTE — Telephone Encounter (Signed)
Saw patient for disability visit today. Got social security form from Dr. Carollee Sires box. Will fill out and mail to patient.  Myrene Buddy MD PGY-2 Family Medicine Resident

## 2018-11-29 NOTE — Patient Instructions (Addendum)
It was great seeing you, I am glad that things have been going well. I will fill out your form and mail to your address. I updated your medication list to reflect that you are now taking abilify and trazodone. Keep up all the good work and continue making your visits to [employer] [facility].

## 2018-11-29 NOTE — Telephone Encounter (Signed)
Filled out form and placed in nurses box  Myrene Buddy MD PGY-2 Family Medicine Resident

## 2018-12-01 DIAGNOSIS — F4310 Post-traumatic stress disorder, unspecified: Secondary | ICD-10-CM | POA: Insufficient documentation

## 2018-12-01 NOTE — Progress Notes (Signed)
   HPI 48 year old male who presents for paperwork for disability.  Patient is requesting that Abilify and trazodone be added to medication list.  He states that he is getting care for PTSD and depression at [facility].  He states that he is seen at [facility] 1 time per month.  States that he is in a "good place right now".  He is requesting that his most recent records from our clinic the mailed to him for his disability paperwork renewal.  CC: Disability paperwork renewal   ROS:   Review of Systems See HPI for ROS.   CC, SH/smoking status, and VS noted  Objective: BP 92/62   Pulse 81   Temp 98.6 F (37 C) (Oral)   Ht 5\' 11"  (1.803 m)   Wt 163 lb 6.4 oz (74.1 kg)   SpO2 91%   BMI 22.79 kg/m  Gen: 49 year old African-American male, no acute stress, resting comfortably CV: Regular rate and rhythm, no MRG Resp: CTAB, no wheezes, non-labored Abd: SNTND, BS present, no guarding or organomegaly Neuro: Alert and oriented, Speech clear, No gross deficits   Assessment and plan:  PTSD (post-traumatic stress disorder) Patient now being seen at [facility] for PTSD and depression.  Asked that Abilify and trazodone be added to his medication list.  Fill out report and sent most recent lab results and clinic notes to patient's address.   No orders of the defined types were placed in this encounter.   Meds ordered this encounter  Medications  . ARIPiprazole (ABILIFY) 5 MG tablet    Sig: Take 1 tablet (5 mg total) by mouth daily.    Dispense:  1 tablet    Refill:  0  . traZODone (DESYREL) 50 MG tablet    Sig: Take 1 tablet (50 mg total) by mouth at bedtime as needed for sleep.    Dispense:  1 tablet    Refill:  0     Myrene Buddy MD PGY-2 Family Medicine Resident  12/01/2018 9:37 PM

## 2018-12-01 NOTE — Assessment & Plan Note (Signed)
Patient now being seen at [facility] for PTSD and depression.  Asked that Abilify and trazodone be added to his medication list.  Fill out report and sent most recent lab results and clinic notes to patient's address.

## 2018-12-19 ENCOUNTER — Other Ambulatory Visit: Payer: Self-pay

## 2018-12-20 MED ORDER — QUINAPRIL-HYDROCHLOROTHIAZIDE 20-25 MG PO TABS: 1.0000 | ORAL_TABLET | Freq: Every day | ORAL | 3 refills | Status: DC

## 2018-12-20 MED ORDER — CLOPIDOGREL BISULFATE 75 MG PO TABS: 75.0000 mg | ORAL_TABLET | Freq: Every day | ORAL | 3 refills | Status: DC

## 2018-12-22 ENCOUNTER — Other Ambulatory Visit: Payer: Self-pay | Admitting: Family Medicine

## 2018-12-22 DIAGNOSIS — E1165 Type 2 diabetes mellitus with hyperglycemia: Secondary | ICD-10-CM

## 2018-12-30 ENCOUNTER — Other Ambulatory Visit: Payer: Self-pay | Admitting: Family Medicine

## 2018-12-30 DIAGNOSIS — E1165 Type 2 diabetes mellitus with hyperglycemia: Secondary | ICD-10-CM

## 2019-01-03 ENCOUNTER — Other Ambulatory Visit: Payer: Self-pay | Admitting: Family Medicine

## 2019-01-03 ENCOUNTER — Other Ambulatory Visit: Payer: Self-pay | Admitting: *Deleted

## 2019-01-03 DIAGNOSIS — E1165 Type 2 diabetes mellitus with hyperglycemia: Secondary | ICD-10-CM

## 2019-01-03 MED ORDER — EMPAGLIFLOZIN 25 MG PO TABS: 25.0000 mg | ORAL_TABLET | Freq: Every day | ORAL | 0 refills | Status: DC

## 2019-02-08 NOTE — Progress Notes (Signed)
Virtual Visit via Video Note   This visit type was conducted due to national recommendations for restrictions regarding the COVID-19 Pandemic (e.g. social distancing) in an effort to limit this patient's exposure and mitigate transmission in our community.  Due to his co-morbid illnesses, this patient is at least at moderate risk for complications without adequate follow up.  This format is felt to be most appropriate for this patient at this time.  All issues noted in this document were discussed and addressed.  A limited physical exam was performed with this format.  Please refer to the patient's chart for his consent to telehealth for [facility].   Date:  02/09/2019   ID:  Paul [name], DOB March 12, 1971, MRN [id number]  Patient Location: Home Provider Location: Home  PCP:  Nuala Alpha, DO  Cardiologist:  Minus Breeding, MD  Electrophysiologist:  None   Evaluation Performed:  Follow-Up Visit  Chief Complaint:  CAD  History of Present Illness:    Paul [name] is a 48 y.o. male who is referred for evaluation of CAD.  He lives near [place].  He was having some social issues and transportation problems when I saw him before but it sounds like he is doing better.  Last year he had some syncope because of low blood sugars.  He still has some low blood sugars and he needs to get a monitor apparently.  His A1c is not at all controlled although better than it has been.  From a cardiac standpoint he says he is doing well. The patient denies any new symptoms such as chest discomfort, neck or arm discomfort. There has been no new shortness of breath, PND or orthopnea. There have been no reported palpitations, presyncope or syncope.  He does some walking for exercise.  He does have to use a cane if he goes uphill.  The patient does not have symptoms concerning for COVID-19 infection (fever, chills, cough, or new shortness of breath).    Past Medical History:  Diagnosis Date   . Anxiety   . CAD (coronary artery disease)    Anterior MI 2007 with DES stenting of an occluded LAD, distal 70% stenosis, OM 60% stenosis, circumflex 90% stenosis treated with a drug-eluting stent, PA 90% stenosis treated medically.  Cath January 2013 70-80% diagonal stenosis in a small vessel, OM 50% stenosis, PDA long 75% stenosis. EF 45%. He was managed medically.  . Cardiomyopathy, ischemic    EF 45%  . Diabetes mellitus   . History of chest pain 11/25/2010  . Hyperlipidemia   . Hypertension    Past Surgical History:  Procedure Laterality Date  . Catherization  08/25/11   Dr. Percival Spanish  . CORONARY ANGIOPLASTY WITH STENT PLACEMENT  207, 2011   4 stents placed  . LEFT HEART CATHETERIZATION WITH CORONARY ANGIOGRAM N/A 08/25/2011   Procedure: LEFT HEART CATHETERIZATION WITH CORONARY ANGIOGRAM;  Surgeon: Minus Breeding, MD;  Location: [facility] CATH LAB;  Service: Cardiovascular;  Laterality: N/A;    Prior to Admission medications   Medication Sig Start Date End Date Taking? Authorizing Provider  ARIPiprazole (ABILIFY) 5 MG tablet Take 1 tablet (5 mg total) by mouth daily. 11/29/18  Yes Guadalupe Dawn, MD  aspirin 81 MG EC tablet Take 1 tablet (81 mg total) by mouth daily. 09/30/17  Yes Mikell, Jeani Sow, MD  Blood Glucose Monitoring Suppl (ONE TOUCH ULTRA 2) W/DEVICE KIT Check sugar once daily. ICD10 E11.65 01/24/15  Yes Leone Brand, MD  busPIRone (BUSPAR) 7.5 MG tablet  Take 1 tablet (7.5 mg total) by mouth daily. 03/06/16  Yes Mikell, Jeani Sow, MD  carvedilol (COREG) 25 MG tablet Take 1 tablet (25 mg total) by mouth 2 (two) times daily with a meal. 10/04/18  Yes Lockamy, Timothy, DO  citalopram (CELEXA) 20 MG tablet Take 1 tablet (20 mg total) by mouth 2 (two) times daily. 09/19/18  Yes Nuala Alpha, DO  clopidogrel (PLAVIX) 75 MG tablet Take 1 tablet (75 mg total) by mouth daily. 12/20/18  Yes Lockamy, Timothy, DO  empagliflozin (JARDIANCE) 25 MG TABS tablet Take 25 mg by mouth daily.  01/03/19  Yes Lockamy, Timothy, DO  gabapentin (NEURONTIN) 100 MG capsule Take 1 capsule (100 mg total) by mouth 3 (three) times daily. 01/10/18  Yes Mikell, Jeani Sow, MD  GARLIC PO Take by mouth. Pt eats 2 garlic cloves a day mixed with applesauce   Yes [provider]  glucose blood (ONE TOUCH ULTRA TEST) test strip Check sugar once daily in the morning. ICD10 E11.65 01/10/18  Yes Mikell, Jeani Sow, MD  hydrALAZINE (APRESOLINE) 10 MG tablet Take 1 tablet (10 mg total) by mouth 3 (three) times daily. 01/10/18  Yes Mikell, Jeani Sow, MD  insulin glargine (LANTUS) 100 unit/mL SOPN Take 25 units daily 01/10/18  Yes Mikell, Jeani Sow, MD  isosorbide mononitrate (IMDUR) 30 MG 24 hr tablet Take 1 tablet (30 mg total) by mouth daily. 09/21/18  Yes Nuala Alpha, DO  JARDIANCE 25 MG TABS tablet Take 1 tablet by mouth once daily 01/03/19  Yes Lockamy, Timothy, DO  liraglutide (VICTOZA) 18 MG/3ML SOPN Inject 0.1 mLs (0.6 mg total) into the skin daily. Inject 1.35m into the skin daily. 10/03/18  Yes Lockamy, Timothy, DO  metFORMIN (GLUCOPHAGE) 1000 MG tablet TAKE 1 TABLET BY MOUTH TWICE DAILY WITH A MEAL 09/19/18  Yes Lockamy, Timothy, DO  Multiple Vitamin (MULTIVITAMIN WITH MINERALS) TABS tablet Take 1 tablet by mouth daily.   Yes [provider]  nitroGLYCERIN (NITROSTAT) 0.4 MG SL tablet Place 1 tablet (0.4 mg total) under the tongue every 5 (five) minutes as needed. 10/04/18  Yes Lockamy, Timothy, DO  ONE TOUCH LANCETS MISC Use to check sugar once daily. ICD10 E11.65 01/09/15  Yes BWilleen Niece MD  pravastatin (PRAVACHOL) 40 MG tablet Take 1 tablet (40 mg total) by mouth every evening. 09/19/18 09/14/19 Yes LNuala Alpha DO  Probiotic Product (PROBIOTIC PO) Take 1 tablet by mouth daily.   Yes [provider]  quinapril-hydrochlorothiazide (ACCURETIC) 20-25 MG tablet Take 1 tablet by mouth daily. 12/20/18  Yes Lockamy, TChristia Reading DO  traZODone (DESYREL) 50 MG tablet Take 1 tablet  (50 mg total) by mouth at bedtime as needed for sleep. 11/29/18  Yes FGuadalupe Dawn MD  triamcinolone cream (KENALOG) 0.1 % Apply 1 application topically 2 (two) times daily. 09/30/17  Yes Mikell, AJeani Sow MD    Allergies:   Shrimp [shellfish allergy]   Social History   Tobacco Use  . Smoking status: Never Smoker  . Smokeless tobacco: Never Used  Substance Use Topics  . Alcohol use: No  . Drug use: No     Family Hx: The patient's family history includes Cancer in his maternal grandmother; Diabetes in his maternal aunt and maternal uncle; Hypertension in his father and mother.  ROS:   Please see the history of present illness.    As stated in the HPI and negative for all other systems.   Prior CV studies:   The following studies were reviewed today:  Labs/Other Tests and Data Reviewed:    EKG:  No ECG reviewed.  Recent Labs: No results found for requested labs within last 8760 hours.   Recent Lipid Panel Lab Results  Component Value Date/Time   CHOL 141 01/10/2018 02:19 PM   TRIG 226 (H) 01/10/2018 02:19 PM   HDL 24 (L) 01/10/2018 02:19 PM   CHOLHDL 5.9 (H) 01/10/2018 02:19 PM   CHOLHDL 6.5 (H) 08/21/2016 09:17 AM   LDLCALC 72 01/10/2018 02:19 PM   LDLDIRECT 84 09/03/2011 03:48 PM    Wt Readings from Last 3 Encounters:  02/09/19 163 lb (73.9 kg)  11/29/18 163 lb 6.4 oz (74.1 kg)  06/17/18 162 lb 6.4 oz (73.7 kg)     Objective:    Vital Signs:  Ht 5' 11"  (1.803 m)   Wt 163 lb (73.9 kg)   BMI 22.73 kg/m    VITAL SIGNS:  reviewed GEN:  no acute distress EYES:  sclerae anicteric, EOMI - Extraocular Movements Intact NEURO:  alert and oriented x 3, no obvious focal deficit PSYCH:  normal affect  ASSESSMENT & PLAN:    CAD (coronary artery disease) -  The patient has no new symptoms.  No further cardiovascular testing is suggested.  He needs aggressive risk reduction.   Cardiomyopathy, ischemic -  His ejection fraction last year was stable at  45%.  He is on excellent medicines and seems to be euvolemic by history.  No change in therapy.   Hyperlipidemia -  His LDL was good last year.  I think he can wait till he comes up here for an appointment at some point in time to get this repeated and he will remain on the meds as listed.  DM - A1c was 12.1.  I sent a message to his primary care to see if they can establish a virtual visit with him in perhaps take advantage of our current situation to get him to a better A1c.  Because he lives far away virtual visits will be ideal.   COVID-19 Education: The signs and symptoms of COVID-19 were discussed with the patient and how to seek care for testing (follow up with PCP or arrange E-visit).  The importance of social distancing was discussed today.  Time:   Today, I have spent 20 minutes with the patient with telehealth technology discussing the above problems.     Medication Adjustments/Labs and Tests Ordered: Current medicines are reviewed at length with the patient today.  Concerns regarding medicines are outlined above.   Tests Ordered: No orders of the defined types were placed in this encounter.   Medication Changes: No orders of the defined types were placed in this encounter.   Follow Up:  In Person in one year  Signed, Minus Breeding, MD  02/09/2019 3:54 PM    [place]

## 2019-02-09 ENCOUNTER — Telehealth (INDEPENDENT_AMBULATORY_CARE_PROVIDER_SITE_OTHER): Payer: Medicare Other | Admitting: Cardiology

## 2019-02-09 ENCOUNTER — Telehealth: Payer: Medicare Other | Admitting: Cardiology

## 2019-02-09 ENCOUNTER — Encounter: Payer: Self-pay | Admitting: Cardiology

## 2019-02-09 ENCOUNTER — Telehealth: Payer: Self-pay | Admitting: *Deleted

## 2019-02-09 VITALS — Ht 71.0 in | Wt 163.0 lb

## 2019-02-09 DIAGNOSIS — I255 Ischemic cardiomyopathy: Secondary | ICD-10-CM

## 2019-02-09 DIAGNOSIS — I2510 Atherosclerotic heart disease of native coronary artery without angina pectoris: Secondary | ICD-10-CM

## 2019-02-09 DIAGNOSIS — E118 Type 2 diabetes mellitus with unspecified complications: Secondary | ICD-10-CM | POA: Diagnosis not present

## 2019-02-09 DIAGNOSIS — I429 Cardiomyopathy, unspecified: Secondary | ICD-10-CM | POA: Diagnosis not present

## 2019-02-09 DIAGNOSIS — Z7189 Other specified counseling: Secondary | ICD-10-CM

## 2019-02-09 NOTE — Telephone Encounter (Signed)
Pt scheduled for a virtual visit Monday with dr Tammi Klippel. Delray Alt, CMA

## 2019-02-09 NOTE — Telephone Encounter (Signed)
-----   Message from Nuala Alpha, DO sent at 02/09/2019  3:34 PM EDT ----- Regarding: contact patient to schedule an appointment Hello! Can we please contact this patient to get him scheduled with a virtual or in office visit with me? I got a notification from Dr. Patria Mane that his blood glucose is very high and I want to get him in to be seen ASAP. Thanks!!  Paul [name]

## 2019-02-09 NOTE — Patient Instructions (Signed)
Medication Instructions:  Your physician recommends that you continue on your current medications as directed. Please refer to the Current Medication list given to you today.  If you need a refill on your cardiac medications before your next appointment, please call your pharmacy.   Lab work: NONE   Testing/Procedures: NONE  Follow-Up: At [facility], you and your health needs are our priority.  As part of our continuing mission to provide you with exceptional heart care, we have created designated Provider Care Teams.  These Care Teams include your primary Cardiologist (physician) and Advanced Practice Providers (APPs -  Physician Assistants and Nurse Practitioners) who all work together to provide you with the care you need, when you need it. You will need a follow up appointment in 12 months. IN THE OFFICE   Please call our office 2 months in advance to schedule this appointment.  You may see Minus Breeding, MD or one of the following Advanced Practice Providers on your designated Care Team:   Rosaria Ferries, PA-C . Jory Sims, DNP, ANP

## 2019-02-12 NOTE — Progress Notes (Signed)
[place] Telemedicine Visit  I connected with  Paul [name] on 02/13/19 by a video enabled telemedicine application and verified that I am speaking with the correct person using two identifiers.   I discussed the limitations of evaluation and management by telemedicine. The patient expressed understanding and agreed to proceed.  Patient consented to have virtual visit. Method of visit: Telephone  Encounter participants: Patient: Paul [name] - located at Home Provider: Caroline More - located at [facility] Others (if applicable): none  Chief Complaint: Diabetes   HPI: Diabetes Fasting checks: not really checking, when he does check they are around 125  Post prandial does not check   Compliance: good  Diet: only eats once a day, eats lots of fruits, likes fast food. Yesterday ate ramen noodles  Exercise: walks to the store sometimes  Eye exam: needs exam Foot exam: due  A1C: 12.2 on 06/2018 Symptoms: no symptoms of hypoglycemia. no symptoms of  polyuria, polydipsia. no numbness in extremities, and no foot ulcers/trauma Meds: Jardiance 25 mg daily, Lantus 25 units daily, Victoza 1.2 mg daily, Metformin 1000 mg twice daily Pneumonia vaccine: UTD Urine micro albumin:creatine ratio: UTD  Monitoring Labs and Parameters Last A1C:  Lab Results  Component Value Date   HGBA1C 12.2 (A) 06/17/2018   Last Lipid:     Component Value Date/Time   CHOL 141 01/10/2018 1419   HDL 24 (L) 01/10/2018 1419   LDLDIRECT 84 09/03/2011 1548   Last Bmet  Potassium  Date Value Ref Range Status  01/10/2018 4.7 3.5 - 5.2 mmol/L Final   Sodium  Date Value Ref Range Status  01/10/2018 137 134 - 144 mmol/L Final   Creatinine, Ser  Date Value Ref Range Status  01/10/2018 1.17 0.76 - 1.27 mg/dL Final      ROS: per HPI   Pertinent PMHx: T2DM (uncontrolled), CAD, HTN  Exam:  Respiratory: no respiratory distress, speaking full sentences, NAD    Assessment/Plan:  Diabetes mellitus type 2, uncontrolled Per documentation from November patient was uncontrolled.  Patient reports noncompliance with checking his blood sugars.  Does state that when he does check them his fasting blood sugars are around 125 or less.  Patient reports poor diet and exercise as well.  Current medication regimen including Jardiance, Lantus, Victoza, metformin.  I would like to get blood work to ensure that patient is truly compliant.  Advised that patient follow-up in 1 week so that A1c can be measured.  If A1c is elevated can consider increasing Lantus.  Discussed in detail the importance of healthy eating and daily exercise.  Went through a normal diabetic diet with patient.  Discussed not eating fast food or Ramen noodles as these are high in carbohydrates.  Also discussed that several fruits are high in sugar as well.  Patient states that he will work on his diet and exercise.  Informed patient that he needs to keep a log of his fasting and postprandial sugars and bring them in when he follows up with me in 1 week. -Continue current regimen -Follow-up in 1 week -Strict return precautions given    Time spent during visit with patient: 15 minutes

## 2019-02-13 ENCOUNTER — Other Ambulatory Visit: Payer: Self-pay

## 2019-02-13 ENCOUNTER — Telehealth (INDEPENDENT_AMBULATORY_CARE_PROVIDER_SITE_OTHER): Payer: Medicare Other | Admitting: Family Medicine

## 2019-02-13 DIAGNOSIS — E1165 Type 2 diabetes mellitus with hyperglycemia: Secondary | ICD-10-CM

## 2019-02-13 DIAGNOSIS — R739 Hyperglycemia, unspecified: Secondary | ICD-10-CM

## 2019-02-13 NOTE — Assessment & Plan Note (Signed)
Per documentation from November patient was uncontrolled.  Patient reports noncompliance with checking his blood sugars.  Does state that when he does check them his fasting blood sugars are around 125 or less.  Patient reports poor diet and exercise as well.  Current medication regimen including Jardiance, Lantus, Victoza, metformin.  I would like to get blood work to ensure that patient is truly compliant.  Advised that patient follow-up in 1 week so that A1c can be measured.  If A1c is elevated can consider increasing Lantus.  Discussed in detail the importance of healthy eating and daily exercise.  Went through a normal diabetic diet with patient.  Discussed not eating fast food or Ramen noodles as these are high in carbohydrates.  Also discussed that several fruits are high in sugar as well.  Patient states that he will work on his diet and exercise.  Informed patient that he needs to keep a log of his fasting and postprandial sugars and bring them in when he follows up with me in 1 week. -Continue current regimen -Follow-up in 1 week -Strict return precautions given

## 2019-02-19 NOTE — Progress Notes (Signed)
Subjective:    Patient ID: Paul [name], male    DOB: [birth year], 48 y.o.   MRN: [id number]   CC: diabetes   HPI: Diabetes Fasting checks: does not check, has fear of injections  Post prandial does not check, fear of injections   Compliance: good  Diet: no more sweets, eats vegetables more, no breads  Exercise: none  Eye exam: needs new doctor, referral placed  Foot exam: UTD  A1C: 10.8 Symptoms: no symptoms of hypoglycemia. no symptoms of  polyuria, polydipsia. no numbness in extremities, and no foot ulcers/trauma Meds: Jardiance 25 mg daily, Lantus 25 units daily, Victoza 1.2 mg daily, Metformin 1000 mg twice daily Pneumonia vaccine: completed per HM tab  Urine micro albumin:creatine ratio: will obtain today   Monitoring Labs and Parameters Last A1C:  Lab Results  Component Value Date   HGBA1C 10.8 (A) 02/20/2019   Last Lipid:     Component Value Date/Time   CHOL 141 01/10/2018 1419   HDL 24 (L) 01/10/2018 1419   LDLDIRECT 84 09/03/2011 1548   Last Bmet  Potassium  Date Value Ref Range Status  01/10/2018 4.7 3.5 - 5.2 mmol/L Final   Sodium  Date Value Ref Range Status  01/10/2018 137 134 - 144 mmol/L Final   Creatinine, Ser  Date Value Ref Range Status  01/10/2018 1.17 0.76 - 1.27 mg/dL Final      Hypotension with h/o Hypertension: - Medications: accuretic 20-25mg , imdur 30mg  daily, carvedilol 25mg , asa 81mg   - Compliance: good  - Checking BP at home: no  - Denies any SOB, CP, vision changes, LE edema, medication SEs, or symptoms of hypotension - Diet: see above  - Exercise: see above  -Patient with hypotension on arrival.  Blood pressure was low was 87/62 with repeat 71/52.  Patient reports he is asymptomatic.  On further questioning he does say he is "dizzy when standing for long periods of time".  Did have some "shortness of breath" secondary to meth use but states that when he takes his mask off he is no longer short of breath.  Denies any edema.   Does sleep in a couch x3 years.  Denies any PND.  Depression Follows with [facility].  States that his anxiety and depression keep her from checking his blood sugars.   Objective:  BP (!) 71/52    Pulse (!) 114  Vitals and nursing note reviewed  General: well nourished, in no acute distress HEENT: normocephalic, no scleral icterus or conjunctival pallor, no nasal discharge, moist mucous membranes, good dentition without erythema or discharge noted in posterior oropharynx Cardiac: RRR, clear S1 and S2, no murmurs, rubs, or gallops Respiratory: clear to auscultation bilaterally, no increased work of breathing Abdomen: soft, nontender, nondistended, no masses or organomegaly. Bowel sounds present Extremities: no edema or cyanosis. Warm, well perfused. 2+ radial pulses bilaterally Skin: warm and dry, no rashes noted Neuro: alert and oriented, no focal deficits, oriented x4   Assessment & Plan:    Hypotension EKG obtained today given hypotension with tachycardia.  Showing changes from previous.  Discussed this with patient.  Discussed with preceptor Dr. Manson PasseyBrown as well who agrees with recommendation to go to the emergency department for further work-up as he may be undergoing a cardiac event.  Has a history of cardiac disease as well as heart failure with EF of 40-45% per last echo from 01/2018.  Patient informed that I recommended he go to ED. Risks of death and killing others discussed. Recommended patient  not drive. Patient refusing ED at this time. States he would rather go to [place]. Recommended against this but patient refused. Was oriented x4. Able to verbalize risks and benefits of ED. Given that patient had capacity and was able to verbalize risks he has chosen to refuse ED transfer.  Again stressed risks of hurting others and hurting himself and inform patient not to drive.  Strict return precautions given.  Advised patient that he should call this afternoon if any symptoms worsen has  there is an after-hours emergency line physician.  Obtained phone number that we may call patient.  Phone number is [phone]. Discontinued all BP medications other than carvedilol. Have ordered BMP, CBC, and BNP. Follow up with PCP in 1 week  Diabetes mellitus type 2, uncontrolled Poorly controlled with A1c of 10.8.  Patient however is very noncompliant and does not check his sugars.  Does administer all medications as prescribed.  Was able to verbalize dosages of medications to confirm he was taking them appropriately.  Current medications are Jardiance, Lantus, Victoza, metformin.  I am concerned to increase Lantus dosing as patient does not check his CBGs and this can lead to hypoglycemia.  We will attempt to do continuous glucose monitor so that patient can have fewer sticks.  Discussed that insurance may not cover this cost and patient is hopeful to obtain.  Stressed importance of exercise and patient states he will work on this. -Continue current regimen -Follow-up in 1 week with PCP -Strict return precautions given -amb ref to ophthalmology  -CGM ordered -microalbumin: creatinine ratio ordered   Anxiety state Follows with [facility].  Advised to continue to follow-up.  Does not wish to speak with family medicine behavioral health.  Discussed with Dr. Owens Shark    Return in about 9 days (around 03/01/2019).   Caroline More, DO, PGY-3

## 2019-02-20 ENCOUNTER — Other Ambulatory Visit: Payer: Self-pay

## 2019-02-20 ENCOUNTER — Ambulatory Visit (HOSPITAL_COMMUNITY)
Admission: RE | Admit: 2019-02-20 | Discharge: 2019-02-20 | Disposition: A | Discharge status: Home / Self Care | Payer: Medicare Other | Source: Ambulatory Visit | Attending: Family Medicine | Admitting: Family Medicine

## 2019-02-20 ENCOUNTER — Encounter: Payer: Self-pay | Admitting: Family Medicine

## 2019-02-20 ENCOUNTER — Ambulatory Visit (INDEPENDENT_AMBULATORY_CARE_PROVIDER_SITE_OTHER): Payer: Medicare Other | Admitting: Family Medicine

## 2019-02-20 VITALS — BP 71/52 | HR 114

## 2019-02-20 DIAGNOSIS — R000 Tachycardia, unspecified: Secondary | ICD-10-CM | POA: Insufficient documentation

## 2019-02-20 DIAGNOSIS — F411 Generalized anxiety disorder: Secondary | ICD-10-CM | POA: Diagnosis not present

## 2019-02-20 DIAGNOSIS — E119 Type 2 diabetes mellitus without complications: Secondary | ICD-10-CM | POA: Diagnosis not present

## 2019-02-20 DIAGNOSIS — R0609 Other forms of dyspnea: Secondary | ICD-10-CM | POA: Diagnosis not present

## 2019-02-20 DIAGNOSIS — E1165 Type 2 diabetes mellitus with hyperglycemia: Secondary | ICD-10-CM

## 2019-02-20 DIAGNOSIS — I959 Hypotension, unspecified: Secondary | ICD-10-CM | POA: Insufficient documentation

## 2019-02-20 DIAGNOSIS — I9589 Other hypotension: Secondary | ICD-10-CM

## 2019-02-20 DIAGNOSIS — R0600 Dyspnea, unspecified: Secondary | ICD-10-CM

## 2019-02-20 LAB — POCT GLYCOSYLATED HEMOGLOBIN (HGB A1C): HbA1c, POC (controlled diabetic range): 10.8 % — AB (ref 0.0–7.0)

## 2019-02-20 MED ORDER — CONTINUOUS GLUCOSE MONITOR KIT: 1.0000 | PACK | Freq: Every day | 0 refills | Status: AC

## 2019-02-20 NOTE — Assessment & Plan Note (Addendum)
Poorly controlled with A1c of 10.8.  Patient however is very noncompliant and does not check his sugars.  Does administer all medications as prescribed.  Was able to verbalize dosages of medications to confirm he was taking them appropriately.  Current medications are Jardiance, Lantus, Victoza, metformin.  I am concerned to increase Lantus dosing as patient does not check his CBGs and this can lead to hypoglycemia.  We will attempt to do continuous glucose monitor so that patient can have fewer sticks.  Discussed that insurance may not cover this cost and patient is hopeful to obtain.  Stressed importance of exercise and patient states he will work on this. -Continue current regimen -Follow-up in 1 week with PCP -Strict return precautions given -amb ref to ophthalmology  -CGM ordered -microalbumin: creatinine ratio ordered

## 2019-02-20 NOTE — Assessment & Plan Note (Signed)
Follows with [facility].  Advised to continue to follow-up.  Does not wish to speak with family medicine behavioral health.

## 2019-02-20 NOTE — Patient Instructions (Signed)
It was a pleasure seeing you today.   Today we discussed your blood pressure and diabetes   For your diabetes: I have ordered you a continuous glucose monitor  For your blood pressure: stop all your blood pressure medicines other than carvedilol. I AM RECOMMENDING YOU GO TO THE EMERGENCY ROOM IMMEDIATLY. I DO NOT THINK IT IS SAFE FOR YOU TO DRIVE. Please seek care as soon as possible. The risk of you leaving is DEATH/DYING and KILLING ANOTHER PERSON.   Please follow up in 1 week with your PCP or sooner if symptoms persist or worsen. Please call the clinic immediately if you have any concerns.   Our clinic's number is [phone]. Please call with questions or concerns.   Please go to the emergency room as soon as possible.   Thank you,  Caroline More, DO

## 2019-02-20 NOTE — Assessment & Plan Note (Addendum)
EKG obtained today given hypotension with tachycardia.  Showing changes from previous.  Discussed this with patient.  Discussed with preceptor Dr. Owens Shark as well who agrees with recommendation to go to the emergency department for further work-up as he may be undergoing a cardiac event.  Has a history of cardiac disease as well as heart failure with EF of 40-45% per last echo from 01/2018.  Patient informed that I recommended he go to ED. Risks of death and killing others discussed. Recommended patient not drive. Patient refusing ED at this time. States he would rather go to [place]. Recommended against this but patient refused. Was oriented x4. Able to verbalize risks and benefits of ED. Given that patient had capacity and was able to verbalize risks he has chosen to refuse ED transfer.  Again stressed risks of hurting others and hurting himself and inform patient not to drive.  Strict return precautions given.  Advised patient that he should call this afternoon if any symptoms worsen has there is an after-hours emergency line physician.  Obtained phone number that we may call patient.  Phone number is [phone]. Discontinued all BP medications other than carvedilol. Have ordered BMP, CBC, and BNP. Follow up with PCP in 1 week

## 2019-02-21 ENCOUNTER — Telehealth: Payer: Self-pay | Admitting: *Deleted

## 2019-02-21 DIAGNOSIS — Z9119 Patient's noncompliance with other medical treatment and regimen: Secondary | ICD-10-CM | POA: Diagnosis not present

## 2019-02-21 DIAGNOSIS — Z955 Presence of coronary angioplasty implant and graft: Secondary | ICD-10-CM | POA: Diagnosis not present

## 2019-02-21 DIAGNOSIS — R002 Palpitations: Secondary | ICD-10-CM | POA: Diagnosis not present

## 2019-02-21 DIAGNOSIS — I5022 Chronic systolic (congestive) heart failure: Secondary | ICD-10-CM | POA: Diagnosis not present

## 2019-02-21 DIAGNOSIS — I110 Hypertensive heart disease with heart failure: Secondary | ICD-10-CM | POA: Diagnosis not present

## 2019-02-21 DIAGNOSIS — I959 Hypotension, unspecified: Secondary | ICD-10-CM | POA: Diagnosis not present

## 2019-02-21 DIAGNOSIS — Z9181 History of falling: Secondary | ICD-10-CM | POA: Insufficient documentation

## 2019-02-21 DIAGNOSIS — R079 Chest pain, unspecified: Secondary | ICD-10-CM | POA: Diagnosis not present

## 2019-02-21 LAB — CBC WITH DIFFERENTIAL/PLATELET
Basophils Absolute: 0 10*3/uL (ref 0.0–0.2)
Basos: 0 %
EOS (ABSOLUTE): 0.3 10*3/uL (ref 0.0–0.4)
Eos: 3 %
Hematocrit: 41.2 % (ref 37.5–51.0)
Hemoglobin: 13.9 g/dL (ref 13.0–17.7)
Immature Grans (Abs): 0 10*3/uL (ref 0.0–0.1)
Immature Granulocytes: 0 %
Lymphocytes Absolute: 3.1 10*3/uL (ref 0.7–3.1)
Lymphs: 39 %
MCH: 27 pg (ref 26.6–33.0)
MCHC: 33.7 g/dL (ref 31.5–35.7)
MCV: 80 fL (ref 79–97)
Monocytes Absolute: 0.6 10*3/uL (ref 0.1–0.9)
Monocytes: 7 %
Neutrophils Absolute: 4 10*3/uL (ref 1.4–7.0)
Neutrophils: 51 %
Platelets: 272 10*3/uL (ref 150–450)
RBC: 5.14 x10E6/uL (ref 4.14–5.80)
RDW: 12.9 % (ref 11.6–15.4)
WBC: 8 10*3/uL (ref 3.4–10.8)

## 2019-02-21 LAB — BASIC METABOLIC PANEL
BUN/Creatinine Ratio: 13 (ref 9–20)
BUN: 17 mg/dL (ref 6–24)
CO2: 27 mmol/L (ref 20–29)
Calcium: 9.5 mg/dL (ref 8.7–10.2)
Chloride: 95 mmol/L — ABNORMAL LOW (ref 96–106)
Creatinine, Ser: 1.28 mg/dL — ABNORMAL HIGH (ref 0.76–1.27)
GFR calc Af Amer: 76 mL/min/{1.73_m2} (ref >59)
GFR calc non Af Amer: 66 mL/min/{1.73_m2} (ref >59)
Glucose: 118 mg/dL — ABNORMAL HIGH (ref 65–99)
Potassium: 3.7 mmol/L (ref 3.5–5.2)
Sodium: 136 mmol/L (ref 134–144)

## 2019-02-21 LAB — BRAIN NATRIURETIC PEPTIDE: BNP: 16 pg/mL (ref 0.0–100.0)

## 2019-02-21 LAB — MICROALBUMIN / CREATININE URINE RATIO
Creatinine, Urine: 81.8 mg/dL
Microalb/Creat Ratio: 7 mg/g creat (ref 0–29)
Microalbumin, Urine: 5.5 ug/mL

## 2019-02-21 MED ORDER — DEXCOM G5 MOB/G4 PLAT SENSOR MISC: 1.0000 "application " | Freq: Once | 0 refills | Status: AC

## 2019-02-21 NOTE — Telephone Encounter (Signed)
[facility] in [place] called and they need different scripts sent in.  They need specific instructions, basically stating to change every 10 days.  To PCP.  Christen Bame, CMA

## 2019-02-21 NOTE — Telephone Encounter (Signed)
RX corrected and sent to pharmacy. Wrote in instructions to change every 10 days.   Dalphine Handing, PGY-3 [facility] 02/21/2019 12:04 PM

## 2019-03-01 ENCOUNTER — Telehealth: Payer: Medicare Other | Admitting: Family Medicine

## 2019-03-01 ENCOUNTER — Other Ambulatory Visit: Payer: Self-pay

## 2019-03-01 NOTE — Progress Notes (Signed)
Attempted to call patient but no answer. LVM.  Harolyn Rutherford, DO [facility], PGY-3

## 2019-03-13 ENCOUNTER — Other Ambulatory Visit: Payer: Self-pay

## 2019-03-13 DIAGNOSIS — E1165 Type 2 diabetes mellitus with hyperglycemia: Secondary | ICD-10-CM

## 2019-03-13 MED ORDER — INSULIN GLARGINE 100 UNITS/ML SOLOSTAR PEN: PEN_INJECTOR | SUBCUTANEOUS | 11 refills | Status: DC

## 2019-03-20 ENCOUNTER — Other Ambulatory Visit: Payer: Self-pay | Admitting: *Deleted

## 2019-03-20 MED ORDER — VICTOZA 18 MG/3ML ~~LOC~~ SOPN: 0.6000 mg | PEN_INJECTOR | Freq: Every day | SUBCUTANEOUS | 3 refills | Status: DC

## 2019-03-21 ENCOUNTER — Other Ambulatory Visit: Payer: Self-pay | Admitting: Family Medicine

## 2019-03-21 ENCOUNTER — Telehealth: Payer: Self-pay | Admitting: *Deleted

## 2019-03-21 ENCOUNTER — Other Ambulatory Visit: Payer: Self-pay

## 2019-03-21 MED ORDER — ISOSORBIDE MONONITRATE ER 30 MG PO TB24: 30.0000 mg | ORAL_TABLET | Freq: Every day | ORAL | 6 refills | Status: DC

## 2019-03-21 MED ORDER — VICTOZA 18 MG/3ML ~~LOC~~ SOPN: 1.2000 mg | PEN_INJECTOR | Freq: Every day | SUBCUTANEOUS | 3 refills | Status: DC

## 2019-03-21 NOTE — Progress Notes (Signed)
Refill for patient.

## 2019-03-21 NOTE — Telephone Encounter (Signed)
Received call from pharmacy there are 2 sets of directions on victoza.  Please resend with correct dosage. Christen Bame, CMA

## 2019-04-06 ENCOUNTER — Other Ambulatory Visit: Payer: Self-pay | Admitting: Family Medicine

## 2019-04-06 DIAGNOSIS — E1165 Type 2 diabetes mellitus with hyperglycemia: Secondary | ICD-10-CM

## 2019-07-12 ENCOUNTER — Encounter (HOSPITAL_COMMUNITY): Payer: Self-pay | Admitting: Emergency Medicine

## 2019-07-12 ENCOUNTER — Ambulatory Visit (INDEPENDENT_AMBULATORY_CARE_PROVIDER_SITE_OTHER)
Admission: EM | Admit: 2019-07-12 | Discharge: 2019-07-12 | Disposition: A | Discharge status: Home / Self Care | Payer: Medicare Other | Source: Home / Self Care

## 2019-07-12 ENCOUNTER — Other Ambulatory Visit: Payer: Self-pay

## 2019-07-12 DIAGNOSIS — I10 Essential (primary) hypertension: Secondary | ICD-10-CM | POA: Diagnosis not present

## 2019-07-12 DIAGNOSIS — E119 Type 2 diabetes mellitus without complications: Secondary | ICD-10-CM | POA: Insufficient documentation

## 2019-07-12 DIAGNOSIS — I2510 Atherosclerotic heart disease of native coronary artery without angina pectoris: Secondary | ICD-10-CM | POA: Diagnosis not present

## 2019-07-12 DIAGNOSIS — Z794 Long term (current) use of insulin: Secondary | ICD-10-CM | POA: Insufficient documentation

## 2019-07-12 DIAGNOSIS — R221 Localized swelling, mass and lump, neck: Secondary | ICD-10-CM | POA: Diagnosis not present

## 2019-07-12 DIAGNOSIS — I255 Ischemic cardiomyopathy: Secondary | ICD-10-CM

## 2019-07-12 DIAGNOSIS — E1169 Type 2 diabetes mellitus with other specified complication: Secondary | ICD-10-CM | POA: Diagnosis not present

## 2019-07-12 DIAGNOSIS — R000 Tachycardia, unspecified: Secondary | ICD-10-CM | POA: Diagnosis not present

## 2019-07-12 DIAGNOSIS — Z79899 Other long term (current) drug therapy: Secondary | ICD-10-CM | POA: Diagnosis not present

## 2019-07-12 DIAGNOSIS — I252 Old myocardial infarction: Secondary | ICD-10-CM | POA: Insufficient documentation

## 2019-07-12 NOTE — ED Provider Notes (Signed)
[facility]   MRN: [id number] DOB: 10-19-1970  Subjective:   Paul [name] is a 48 y.o. male presenting for 84-monthhistory of left-sided intermittent mildly painful growth of the left side of his neck/jaw.  Patient states that the size of the mass fluctuates and generally is not painful but sometimes it is.  Regarding his tachycardia, patient has a history of cardiomyopathy, CAD, hypertension, diabetes, hyperlipidemia.  Patient is status post coronary stent.  He does admit that he has had elevated heart rate in the past.  Denies any chest pain, palpitations, shortness of breath, nausea, vomiting, belly pain.  No current facility-administered medications for this encounter.   Current Outpatient Medications:    ARIPiprazole (ABILIFY) 5 MG tablet, Take 1 tablet (5 mg total) by mouth daily., Disp: 1 tablet, Rfl: 0   aspirin 81 MG EC tablet, Take 1 tablet (81 mg total) by mouth daily., Disp: 30 tablet, Rfl: 11   Blood Glucose Monitoring Suppl (ONE TOUCH ULTRA 2) W/DEVICE KIT, Check sugar once daily. ICD10 E11.65, Disp: 1 each, Rfl: 0   carvedilol (COREG) 25 MG tablet, Take 1 tablet (25 mg total) by mouth 2 (two) times daily with a meal., Disp: 180 tablet, Rfl: 2   citalopram (CELEXA) 20 MG tablet, Take 1 tablet (20 mg total) by mouth 2 (two) times daily., Disp: 180 tablet, Rfl: 3   clopidogrel (PLAVIX) 75 MG tablet, Take 1 tablet (75 mg total) by mouth daily., Disp: 90 tablet, Rfl: 3   Continuous Glucose Monitor KIT, 1 kit by Does not apply route daily., Disp: 1 kit, Rfl: 0   gabapentin (NEURONTIN) 100 MG capsule, Take 1 capsule (100 mg total) by mouth 3 (three) times daily., Disp: 90 capsule, Rfl: 3   GARLIC PO, Take by mouth. Pt eats 2 garlic cloves a day mixed with applesauce, Disp: , Rfl:    glucose blood (ONE TOUCH ULTRA TEST) test strip, Check sugar once daily in the morning. ICD10 E11.65, Disp: 30 each, Rfl: 5   insulin glargine (LANTUS) 100 unit/mL SOPN, Take 25  units daily, Disp: 25 mL, Rfl: 11   isosorbide mononitrate (IMDUR) 30 MG 24 hr tablet, Take 1 tablet (30 mg total) by mouth daily., Disp: 90 tablet, Rfl: 6   JARDIANCE 25 MG TABS tablet, Take 1 tablet by mouth once daily, Disp: 90 tablet, Rfl: 0   liraglutide (VICTOZA) 18 MG/3ML SOPN, Inject 0.2 mLs (1.2 mg total) into the skin daily., Disp: 3 mL, Rfl: 3   metFORMIN (GLUCOPHAGE) 1000 MG tablet, TAKE 1 TABLET BY MOUTH TWICE DAILY WITH A MEAL, Disp: 180 tablet, Rfl: 3   Multiple Vitamin (MULTIVITAMIN WITH MINERALS) TABS tablet, Take 1 tablet by mouth daily., Disp: , Rfl:    nitroGLYCERIN (NITROSTAT) 0.4 MG SL tablet, Place 1 tablet (0.4 mg total) under the tongue every 5 (five) minutes as needed., Disp: 25 tablet, Rfl: 11   ONE TOUCH LANCETS MISC, Use to check sugar once daily. ICD10 E11.65, Disp: 30 each, Rfl: 5   pravastatin (PRAVACHOL) 40 MG tablet, Take 1 tablet (40 mg total) by mouth every evening., Disp: 90 tablet, Rfl: 3   Probiotic Product (PROBIOTIC PO), Take 1 tablet by mouth daily., Disp: , Rfl:    traZODone (DESYREL) 50 MG tablet, Take 1 tablet (50 mg total) by mouth at bedtime as needed for sleep., Disp: 1 tablet, Rfl: 0   triamcinolone cream (KENALOG) 0.1 %, Apply 1 application topically 2 (two) times daily., Disp: 453.6 g, Rfl: 0  Allergies  Allergen Reactions   Shrimp [Shellfish Allergy] Swelling    Past Medical History:  Diagnosis Date   Anxiety    CAD (coronary artery disease)    Anterior MI 2007 with DES stenting of an occluded LAD, distal 70% stenosis, OM 60% stenosis, circumflex 90% stenosis treated with a drug-eluting stent, PA 90% stenosis treated medically.  Cath January 2013 70-80% diagonal stenosis in a small vessel, OM 50% stenosis, PDA long 75% stenosis. EF 45%. He was managed medically.   Cardiomyopathy, ischemic    EF 45%   Diabetes mellitus    History of chest pain 11/25/2010   Hyperlipidemia    Hypertension      Past Surgical History:    Procedure Laterality Date   Catherization  08/25/11   Dr. Percival Spanish   CORONARY ANGIOPLASTY WITH STENT PLACEMENT  207, 2011   4 stents placed   LEFT HEART CATHETERIZATION WITH CORONARY ANGIOGRAM N/A 08/25/2011   Procedure: LEFT HEART CATHETERIZATION WITH CORONARY ANGIOGRAM;  Surgeon: Minus Breeding, MD;  Location[facility] C[facility] LAB;  Service: Cardiovascular;  Laterality: N/A;    Family History  Problem Relation Age of Onset   Hypertension Mother    Hypertension Father    Diabetes Maternal Aunt    Diabetes Maternal Uncle    Cancer Maternal Grandmother     Social History   Tobacco Use   Smoking status: Never Smoker   Smokeless tobacco: Never Used  Substance Use Topics   Alcohol use: No   Drug use: No    ROS   Objective:   Vitals: BP 116/78 (BP Location: Left Arm) Comment: second attempt at blood pressure   Pulse (!) 120    Temp 98.6 F (37 C) (Oral)    Resp 18    SpO2 99%   Vitals with BMI 07/12/2019 02/20/2019 02/20/2019  Height - - -  Weight - - -  BMI - - -  Systolic 007 71 87  Diastolic 78 52 62  Pulse 622 114 113   Physical Exam Constitutional:      General: He is not in acute distress.    Appearance: Normal appearance. He is well-developed. He is not ill-appearing, toxic-appearing or diaphoretic.  HENT:     Head: Normocephalic and atraumatic.      Right Ear: External ear normal.     Left Ear: External ear normal.     Nose: Nose normal.     Mouth/Throat:     Mouth: Mucous membranes are moist.     Pharynx: Oropharynx is clear.  Eyes:     General: No scleral icterus.    Extraocular Movements: Extraocular movements intact.     Pupils: Pupils are equal, round, and reactive to light.  Cardiovascular:     Rate and Rhythm: Regular rhythm. Tachycardia present.     Heart sounds: Normal heart sounds. No murmur. No friction rub. No gallop.   Pulmonary:     Effort: Pulmonary effort is normal. No respiratory distress.     Breath sounds: Normal breath sounds.  No stridor. No wheezing, rhonchi or rales.  Neurological:     Mental Status: He is alert and oriented to person, place, and time.  Psychiatric:        Mood and Affect: Mood normal.        Behavior: Behavior normal.        Thought Content: Thought content normal.    ED ECG REPORT   Date: 07/12/2019  Rate: 117bpm  Rhythm: sinus tachycardia  QRS  Axis: normal  Intervals: normal  ST/T Wave abnormalities: nonspecific T wave changes  Conduction Disutrbances:none  Narrative Interpretation: Q wave in lead III, T wave inversion in lead I and aVL.  Sinus tachycardia at 170 bpm.  Previous EKG from 02/20/2019 was very comparable apart from having PVCs on that EKG.  He did have sinus tachycardia then as well.  Old EKG Reviewed: very comparable  I have personally reviewed the EKG tracing and agree with the computerized printout as noted.   Assessment and Plan :   1. Neck mass   2. Tachycardia   3. Coronary artery disease, angina presence unspecified, unspecified vessel or lesion type, unspecified whether native or transplanted heart   4. Cardiomyopathy, ischemic   5. Essential hypertension   6. Type 2 diabetes mellitus with other specified complication, without long-term current use of insulin (HCC)     Do not suspect infectious process.  Patient needs imaging to further evaluate the neck mass.  Differential includes lymphadenopathy, cyst, malignancy.  Patient has sinus tachycardia with EKG very comparable today compared to EKG from July 2020.  He has a history of tachycardia.  Recommended patient follow-up with PCP as soon as possible and cardiologist.  For now we will hold off on medication until more studies can be done. Counseled patient on potential for adverse effects with medications prescribed/recommended today, strict ER and return-to-clinic precautions discussed, patient verbalized understanding.    Jaynee Eagles, [place] 07/12/19 1839

## 2019-07-12 NOTE — ED Triage Notes (Signed)
Noticed knot to left side of neck 2 months ago. Knot is intermittently painful.   Reports knot gets gradually larger than gets smaller.  Knot is in beard hair

## 2019-07-12 NOTE — Discharge Instructions (Signed)
I do not believe that the neck mass you have is an infection or abscess.  You will need to obtain an ultrasound for further evaluation.  This can be done through your primary care provider, PCP.  If you start to develop fever, neck pain or difficulty swallowing then please report to the emergency room as soon as possible.

## 2019-07-13 ENCOUNTER — Emergency Department (HOSPITAL_COMMUNITY)
Admission: EM | Admit: 2019-07-13 | Discharge: 2019-07-13 | Disposition: A | Discharge status: Home / Self Care | Payer: Medicare Other | Attending: Emergency Medicine | Admitting: Emergency Medicine

## 2019-07-13 ENCOUNTER — Ambulatory Visit: Payer: Medicare Other

## 2019-07-13 ENCOUNTER — Encounter (HOSPITAL_COMMUNITY): Payer: Self-pay | Admitting: Emergency Medicine

## 2019-07-13 ENCOUNTER — Other Ambulatory Visit: Payer: Self-pay

## 2019-07-13 DIAGNOSIS — R221 Localized swelling, mass and lump, neck: Secondary | ICD-10-CM

## 2019-07-13 NOTE — ED Triage Notes (Signed)
Pt to ED due to knot on left side of neck off and on x's 2 months.  Pt was seen at Urgent Care earlier today and was told to follow up with his MD for a ultrasound.  Pt st's he decided to come to the ED tonight for ultrasound

## 2019-07-13 NOTE — ED Provider Notes (Signed)
TIME SEEN: 4:07 AM  CHIEF COMPLAINT: Neck mass  HPI: Patient is a 48 year old male with history of CAD, hypertension, diabetes, hyperlipidemia, ischemic cardiomyopathy who presents to the emergency department with a left-sided neck mass.  Reports it has been intermittent for several years.  States at times it will get bigger and then will improve.  It is not painful.  There is never any drainage.  It is not red or warm.  There is no dental pain, sore throat.  No difficulty swallowing, speaking or breathing.  States he went to urgent care today who recommended an ultrasound as an outpatient.  He came to the ED for this ultrasound.  He has not followed up with his primary care physician for this.  He does have a PCP and insurance.  ROS: See HPI Constitutional: no fever  Eyes: no drainage  ENT: no runny nose   Cardiovascular:  no chest pain  Resp: no SOB  GI: no vomiting GU: no dysuria Integumentary: no rash  Allergy: no hives  Musculoskeletal: no leg swelling  Neurological: no slurred speech ROS otherwise negative  PAST MEDICAL HISTORY/PAST SURGICAL HISTORY:  Past Medical History:  Diagnosis Date   Anxiety    CAD (coronary artery disease)    Anterior MI 2007 with DES stenting of an occluded LAD, distal 70% stenosis, OM 60% stenosis, circumflex 90% stenosis treated with a drug-eluting stent, PA 90% stenosis treated medically.  Cath January 2013 70-80% diagonal stenosis in a small vessel, OM 50% stenosis, PDA long 75% stenosis. EF 45%. He was managed medically.   Cardiomyopathy, ischemic    EF 45%   Diabetes mellitus    History of chest pain 11/25/2010   Hyperlipidemia    Hypertension     MEDICATIONS:  Prior to Admission medications   Medication Sig Start Date End Date Taking? Authorizing Provider  ARIPiprazole (ABILIFY) 5 MG tablet Take 1 tablet (5 mg total) by mouth daily. 11/29/18   Guadalupe Dawn, MD  aspirin 81 MG EC tablet Take 1 tablet (81 mg total) by mouth daily.  09/30/17   Mikell, Jeani Sow, MD  Blood Glucose Monitoring Suppl (ONE TOUCH ULTRA 2) W/DEVICE KIT Check sugar once daily. ICD10 E11.65 01/24/15   Leone Brand, MD  carvedilol (COREG) 25 MG tablet Take 1 tablet (25 mg total) by mouth 2 (two) times daily with a meal. 10/04/18   Lockamy, Timothy, DO  citalopram (CELEXA) 20 MG tablet Take 1 tablet (20 mg total) by mouth 2 (two) times daily. 09/19/18   Nuala Alpha, DO  clopidogrel (PLAVIX) 75 MG tablet Take 1 tablet (75 mg total) by mouth daily. 12/20/18   Nuala Alpha, DO  Continuous Glucose Monitor KIT 1 kit by Does not apply route daily. 02/20/19   Caroline More, DO  gabapentin (NEURONTIN) 100 MG capsule Take 1 capsule (100 mg total) by mouth 3 (three) times daily. 01/10/18   Mikell, Jeani Sow, MD  GARLIC PO Take by mouth. Pt eats 2 garlic cloves a day mixed with applesauce    [provider]  glucose blood (ONE TOUCH ULTRA TEST) test strip Check sugar once daily in the morning. ICD10 E11.65 01/10/18   Tonette Bihari, MD  insulin glargine (LANTUS) 100 unit/mL SOPN Take 25 units daily 03/13/19   Lockamy, Timothy, DO  isosorbide mononitrate (IMDUR) 30 MG 24 hr tablet Take 1 tablet (30 mg total) by mouth daily. 03/21/19   Lockamy, Timothy, DO  JARDIANCE 25 MG TABS tablet Take 1 tablet by mouth  once daily 04/06/19   Lockamy, Timothy, DO  liraglutide (VICTOZA) 18 MG/3ML SOPN Inject 0.2 mLs (1.2 mg total) into the skin daily. 03/21/19   Nuala Alpha, DO  metFORMIN (GLUCOPHAGE) 1000 MG tablet TAKE 1 TABLET BY MOUTH TWICE DAILY WITH A MEAL 09/19/18   Nuala Alpha, DO  Multiple Vitamin (MULTIVITAMIN WITH MINERALS) TABS tablet Take 1 tablet by mouth daily.    [provider]  nitroGLYCERIN (NITROSTAT) 0.4 MG SL tablet Place 1 tablet (0.4 mg total) under the tongue every 5 (five) minutes as needed. 10/04/18   Nuala Alpha, DO  ONE TOUCH LANCETS MISC Use to check sugar once daily. ICD10 E11.65 01/09/15   Willeen Niece, MD    pravastatin (PRAVACHOL) 40 MG tablet Take 1 tablet (40 mg total) by mouth every evening. 09/19/18 09/14/19  Nuala Alpha, DO  Probiotic Product (PROBIOTIC PO) Take 1 tablet by mouth daily.    [provider]  traZODone (DESYREL) 50 MG tablet Take 1 tablet (50 mg total) by mouth at bedtime as needed for sleep. 11/29/18   Guadalupe Dawn, MD  triamcinolone cream (KENALOG) 0.1 % Apply 1 application topically 2 (two) times daily. 09/30/17   Mikell, Jeani Sow, MD    ALLERGIES:  Allergies  Allergen Reactions   Shrimp [Shellfish Allergy] Swelling    SOCIAL HISTORY:  Social History   Tobacco Use   Smoking status: Never Smoker   Smokeless tobacco: Never Used  Substance Use Topics   Alcohol use: No    FAMILY HISTORY: Family History  Problem Relation Age of Onset   Hypertension Mother    Hypertension Father    Diabetes Maternal Aunt    Diabetes Maternal Uncle    Cancer Maternal Grandmother     EXAM: BP 115/74    Pulse 100    Temp 98.9 F (37.2 C) (Oral)    Resp 20    Ht 5' 11"  (1.803 m)    Wt 84.4 kg    SpO2 96%    BMI 25.94 kg/m  CONSTITUTIONAL: Alert and oriented and responds appropriately to questions. Well-appearing; well-nourished HEAD: Normocephalic EYES: Conjunctivae clear, pupils appear equal, EOM appear intact ENT: normal nose; moist mucous membranes, normal-appearing dentition with no tenderness, no swelling underneath the tongue, tongue appears normal and sits flat in the bottom of the mouth; there is no stridor, trismus or drooling; he has normal phonation, able to swallow his secretions without difficulty.  No swelling over the face. NECK: Supple, normal ROM, patient has a 4 x 5 cm mobile firm mass underneath the left mandible.  This area is nontender and has no redness, warmth, fluctuance, induration.  Please see pictures below.  Trachea is midline.  No sign of expanding hematoma.  Able to palpate carotid pulses.  No carotid bruit. CARD: Regular and  intermittently tachycardic; S1 and S2 appreciated; no murmurs, no clicks, no rubs, no gallops RESP: Normal chest excursion without splinting or tachypnea; breath sounds clear and equal bilaterally; no wheezes, no rhonchi, no rales, no hypoxia or respiratory distress, speaking full sentences ABD/GI: Normal bowel sounds; non-distended; soft, non-tender, no rebound, no guarding, no peritoneal signs, no hepatosplenomegaly BACK:  The back appears normal EXT: Normal ROM in all joints; no deformity noted, no edema; no cyanosis SKIN: Normal color for age and race; warm; no rash on exposed skin NEURO: Moves all extremities equally, ambulates without difficulty, normal speech, no facial asymmetry PSYCH: The patient's mood and manner are appropriate.   MEDICAL DECISION MAKING: Patient here with  a mass underneath the left mandible.  It does not appear to be infectious in nature.  He reports it has been waxing and waning for many years.  He reports his ex-wife told him to get evaluated today.  He went to urgent care and they recommended outpatient follow-up and he interpreted that as coming to the ER.  He has no emergent symptoms.  No pain, fevers, difficulty swallowing, difficulty breathing or difficulty speaking.  Discussed with patient that this could be on the thyroid.  It seems lower than the submandibular glands.  I do not think this is an acute abscess.  Also discussed that this could be an abnormal lymph node or mass but given the chronicity of symptoms and that there is no current symptom of an emergent condition that I agree with the urgent care provider that he would be better served with outpatient follow-up with his PCP and they may recommend a CT scan versus a thyroid ultrasound.  We did discuss the possibility of performing a CT scan here in the emergency department but patient feels comfortable following up with his primary care doctor.  We did discuss return precautions including worsening swelling,  difficulty breathing or speaking, difficulty swallowing, pain with movement of the neck, redness or warmth, fever.  Patient verbalized understanding.  He states he will follow-up closely with his primary care physician.  At this time, I do not feel there is any life-threatening condition present. I have reviewed, interpreted and discussed all results (EKG, imaging, lab, urine as appropriate) and exam findings with patient/family. I have reviewed nursing notes and appropriate previous records.  I feel the patient is safe to be discharged home without further emergent workup and can continue workup as an outpatient as needed. Discussed usual and customary return precautions. Patient/family verbalize understanding and are comfortable with this plan.  Outpatient follow-up has been provided as needed. All questions have been answered.        Patient gave verbal permission to utilize photo for medical documentation only. The image was not stored on any personal device.     Ido Wollman was evaluated in Emergency Department on 07/13/2019 for the symptoms described in the history of present illness. He was evaluated in the context of the global COVID-19 pandemic, which necessitated consideration that the patient might be at risk for infection with the SARS-CoV-2 virus that causes COVID-19. Institutional protocols and algorithms that pertain to the evaluation of patients at risk for COVID-19 are in a state of rapid change based on information released by regulatory bodies including the CDC and federal and state organizations. These policies and algorithms were followed during the patient's care in the ED.  Patient was seen wearing N95, face shield, gloves.    Sheyli Horwitz, Delice Bison, DO 07/13/19 [phone]

## 2019-07-13 NOTE — Discharge Instructions (Addendum)
Please follow-up closely with your primary care physician for further evaluation of this chronic left neck mass.  You may need an ultrasound of your thyroid which cannot be done from the emergency department.  Your primary care physician may also recommend a CT scan.  Given this is a chronic condition without emergent features, I recommend outpatient follow-up for further work-up.  If you develop pain to this area, fever of 100.4 or higher, difficulty swallowing or speaking or breathing, pain with movement of the neck or this area seems to be swelling significantly, please return to the emergency department.  Steps to find a Primary Care Provider (PCP):  Call [phone] or [phone] to access "[place] [facility] Service."  2.  You may also go on the [facility] website at [email or web address]  3.  [facility] also frequently accepts new patients.  [place] [phone]  4.  There are also multiple [facility], [facility] and [facility] practices throughout the [place] that are frequently accepting new patients. You may find a clinic that is close to your home and contact them.  [facility] [email or web address] [phone]  [facility] [email or web address]  [facility] [email or web address] [place] [email or web address] [phone]  5.  Local Health Departments also can provide primary care services.  [facility]  [place] [address] [phone]  [facility] [place] [address] [place] [facility] [place] [phone]

## 2019-07-14 ENCOUNTER — Encounter: Payer: Self-pay | Admitting: Family Medicine

## 2019-07-14 ENCOUNTER — Ambulatory Visit (INDEPENDENT_AMBULATORY_CARE_PROVIDER_SITE_OTHER): Payer: Medicare Other | Admitting: Family Medicine

## 2019-07-14 ENCOUNTER — Other Ambulatory Visit: Payer: Self-pay

## 2019-07-14 VITALS — BP 104/60 | Ht 71.0 in | Wt 154.5 lb

## 2019-07-14 DIAGNOSIS — R221 Localized swelling, mass and lump, neck: Secondary | ICD-10-CM

## 2019-07-14 DIAGNOSIS — E1165 Type 2 diabetes mellitus with hyperglycemia: Secondary | ICD-10-CM | POA: Diagnosis not present

## 2019-07-14 DIAGNOSIS — R000 Tachycardia, unspecified: Secondary | ICD-10-CM | POA: Diagnosis not present

## 2019-07-14 LAB — POCT GLYCOSYLATED HEMOGLOBIN (HGB A1C): HbA1c, POC (controlled diabetic range): 7.6 % — AB (ref 0.0–7.0)

## 2019-07-14 NOTE — Assessment & Plan Note (Signed)
A1c much improved.  No change to regimen.  Recommend patient follow-up with PCP 3 to 6 months for diabetes check.

## 2019-07-14 NOTE — Patient Instructions (Signed)
It was a pleasure to see you today! Thank you for choosing [facility] for your primary care. Paul [name] was seen for neck mass.   For this neck mass we are going to get your blood counts and your thyroid levels.  We are ordered a CT scan of your neck.  Because the CT uses contrast, we are checking your kidney function.  We have also referred you to ENT doctor who will also help [place] with this process.  Your diabetes is well controlled.  Please follow-up in 6 months for diabetic recheck.  Best,  Marny Lowenstein, MD, MS FAMILY MEDICINE RESIDENT - PGY3 07/14/2019 2:21 PM

## 2019-07-14 NOTE — Assessment & Plan Note (Signed)
Neck mass with intermittent flaring left lateral neck.  Differential includes malignancy, sialadenitis, thyroglossal duct cyst, less likely thyroid mass, although is tachycardic. Will get CT Neck w/ contrast, CBC, TSH, and refer to ENT.

## 2019-07-14 NOTE — Progress Notes (Signed)
    Subjective:  Paul [name] is a 48 y.o. male who presents to the [facility] today with a chief complaint of left neck mass.   HPI:  Neck mass Patient reports that he has had a left neck mass intermittently since the beginning of year.  Patient reports that the most recently flared up the past week.  Is mildly uncomfortable.  Denies any trouble breathing, difficulty swallowing, fevers, drainage.  Patient was recently seen in urgent care and ED.  They recommended follow-up with PCP, they did not feel that it was infectious..  Patient denies any recent illnesses such as cold.  Patient is a never smoker.  Patient reports dry mouth and halitosis.  Of note patient was tachycardic at his urgent care visit to 120.  Denies any chest pain or feeling of racing heart.  Diabetes mellitus type 2 Patient here for follow-up for A1c check.  It is much improved today at 7.6 down from 10.8.  Patient is on Jardiance 25 mg, glipizide 1.2 mg injection, metformin 1000 mg twice daily.  Patient takes pravastatin.  Patient's blood pressure is at goal.     ROS: Per HPI  PMH: Smoking history reviewed.    Objective:  Physical Exam: BP 104/60   Ht 5\' 11"  (1.803 m)   Wt 154 lb 8 oz (70.1 kg)   BMI 21.55 kg/m   Heart rate is 108 Gen: NAD, resting comfortably HEENT: Moist mucous membranes, lateral, nontender, palpable neck mass, feels euthyroid,  CV: RRR with no murmurs appreciated Pulm: NWOB, CTAB with no crackles, wheezes, or rhonchi GI: Normal bowel sounds present. Soft, Nontender, Nondistended. MSK: no edema, cyanosis, or clubbing noted Skin: warm, dry Neuro: grossly normal, moves all extremities Psych: Normal affect and thought content  Results for orders placed or performed in visit on 07/14/19 (from the past 72 hour(s))  HgB A1c     Status: Abnormal   Collection Time: 07/14/19  1:58 PM  Result Value Ref Range   Hemoglobin A1C     HbA1c POC (<> result, manual entry)     HbA1c, POC (prediabetic  range)     HbA1c, POC (controlled diabetic range) 7.6 (A) 0.0 - 7.0 %     Assessment/Plan:  Neck mass Neck mass with intermittent flaring left lateral neck.  Differential includes malignancy, sialadenitis, thyroglossal duct cyst, less likely thyroid mass, although is tachycardic. Will get CT Neck w/ contrast, CBC, TSH, and refer to ENT.   Diabetes mellitus type 2, uncontrolled A1c much improved.  No change to regimen.  Recommend patient follow-up with PCP 3 to 6 months for diabetes check.    Lab Orders     TSH     CBC with Differential     Basic Metabolic Panel     HgB O2V  No orders of the defined types were placed in this encounter.     Marny Lowenstein, MD, MS FAMILY MEDICINE RESIDENT - PGY3 07/14/2019 3:32 PM

## 2019-07-15 LAB — BASIC METABOLIC PANEL
BUN/Creatinine Ratio: 16 (ref 9–20)
BUN: 17 mg/dL (ref 6–24)
CO2: 26 mmol/L (ref 20–29)
Calcium: 8.8 mg/dL (ref 8.7–10.2)
Chloride: 99 mmol/L (ref 96–106)
Creatinine, Ser: 1.09 mg/dL (ref 0.76–1.27)
GFR calc Af Amer: 92 mL/min/{1.73_m2} (ref >59)
GFR calc non Af Amer: 80 mL/min/{1.73_m2} (ref >59)
Glucose: 221 mg/dL — ABNORMAL HIGH (ref 65–99)
Potassium: 4.1 mmol/L (ref 3.5–5.2)
Sodium: 138 mmol/L (ref 134–144)

## 2019-07-15 LAB — CBC WITH DIFFERENTIAL/PLATELET
Basophils Absolute: 0 10*3/uL (ref 0.0–0.2)
Basos: 0 %
EOS (ABSOLUTE): 0.1 10*3/uL (ref 0.0–0.4)
Eos: 2 %
Hematocrit: 40.9 % (ref 37.5–51.0)
Hemoglobin: 13.1 g/dL (ref 13.0–17.7)
Immature Grans (Abs): 0 10*3/uL (ref 0.0–0.1)
Immature Granulocytes: 0 %
Lymphocytes Absolute: 1.9 10*3/uL (ref 0.7–3.1)
Lymphs: 29 %
MCH: 25.6 pg — ABNORMAL LOW (ref 26.6–33.0)
MCHC: 32 g/dL (ref 31.5–35.7)
MCV: 80 fL (ref 79–97)
Monocytes Absolute: 0.5 10*3/uL (ref 0.1–0.9)
Monocytes: 7 %
Neutrophils Absolute: 4.1 10*3/uL (ref 1.4–7.0)
Neutrophils: 62 %
Platelets: 390 10*3/uL (ref 150–450)
RBC: 5.11 x10E6/uL (ref 4.14–5.80)
RDW: 14.8 % (ref 11.6–15.4)
WBC: 6.6 10*3/uL (ref 3.4–10.8)

## 2019-07-15 LAB — TSH: TSH: 1.21 u[IU]/mL (ref 0.450–4.500)

## 2019-07-18 ENCOUNTER — Telehealth: Payer: Self-pay | Admitting: Family Medicine

## 2019-07-18 ENCOUNTER — Ambulatory Visit
Admission: RE | Admit: 2019-07-18 | Discharge: 2019-07-18 | Disposition: A | Discharge status: Home / Self Care | Payer: Medicare Other | Source: Ambulatory Visit | Attending: Family Medicine | Admitting: Family Medicine

## 2019-07-18 DIAGNOSIS — R221 Localized swelling, mass and lump, neck: Secondary | ICD-10-CM

## 2019-07-18 MED ORDER — IOPAMIDOL (ISOVUE-300) INJECTION 61%
75.0000 mL | Freq: Once | INTRAVENOUS | Status: AC | PRN
  Administered 2019-07-18: 75 mL via INTRAVENOUS

## 2019-07-18 NOTE — Telephone Encounter (Signed)
Will forward to referral coordinator to assist with this. Paul [name],CMA

## 2019-07-18 NOTE — Telephone Encounter (Signed)
Patient with malignant appearing neck mass on CT Neck. We will urgently refer him to ENT for biopsy. I have called patient and informed him of this.

## 2019-07-20 ENCOUNTER — Telehealth: Payer: Self-pay | Admitting: Family Medicine

## 2019-07-20 NOTE — Telephone Encounter (Signed)
Will forward to Dr. Grandville Silos, to discuss ENT appt with patient. Ridgely Anastacio,CMA

## 2019-07-20 NOTE — Telephone Encounter (Signed)
Patient calls about a phone call he was supposed to receive/did receive. Patient was a little all over the place with the information.    Patient says 'I was supposed to receive a call from Dr. Owens Shark about my results.' I told patient it looked like Dr. Grandville Silos called him about the ENT referral. Patient states 'yes, I have already went there on Wednesday and Dr. Owens Shark was supposed to call me.' Dr. Owens Shark said she had not tried contacting him and to send to blue team nurse. Patient says he's not very happy with some news he received from the doctor earlier and would like for someone to call him to discuss this. Please advise.

## 2019-07-20 NOTE — Telephone Encounter (Signed)
Called patient. Informed him of the referral status to Dr. Pollie Friar office and provided his office information. Advised patient to call Dr. Pollie Friar office for further details. Patient appreciated the call.

## 2019-07-21 ENCOUNTER — Other Ambulatory Visit (HOSPITAL_COMMUNITY)
Admission: RE | Admit: 2019-07-21 | Discharge: 2019-07-21 | Disposition: A | Discharge status: Home / Self Care | Payer: Medicare Other | Source: Ambulatory Visit | Attending: Otolaryngology | Admitting: Otolaryngology

## 2019-07-21 ENCOUNTER — Ambulatory Visit (INDEPENDENT_AMBULATORY_CARE_PROVIDER_SITE_OTHER): Payer: Medicare Other | Admitting: Otolaryngology

## 2019-07-21 ENCOUNTER — Encounter (INDEPENDENT_AMBULATORY_CARE_PROVIDER_SITE_OTHER): Payer: Self-pay | Admitting: Otolaryngology

## 2019-07-21 ENCOUNTER — Other Ambulatory Visit: Payer: Self-pay

## 2019-07-21 VITALS — Temp 97.3°F

## 2019-07-21 DIAGNOSIS — I255 Ischemic cardiomyopathy: Secondary | ICD-10-CM | POA: Diagnosis not present

## 2019-07-21 DIAGNOSIS — R591 Generalized enlarged lymph nodes: Secondary | ICD-10-CM | POA: Diagnosis not present

## 2019-07-21 DIAGNOSIS — R221 Localized swelling, mass and lump, neck: Secondary | ICD-10-CM | POA: Diagnosis not present

## 2019-07-21 NOTE — Progress Notes (Signed)
HPI: Paul [name] is a 48 y.o. male who presents is referred by [facility] for evaluation of left neck mass.  Patient states that this is been present for several months and tends to come and go but over the past 3 to 4 weeks is been a little bit larger and more persistent.  It is not tender.  Patient denies a sore throat with no trouble swallowing.  He underwent a CT scan that showed a 2 and half centimeter by 1/2 cm mass adjacent to the right submandibular gland that has cystic changes and was suspicious for possible malignant lymph node.  He is referred here for further evaluation and biopsy..  Past Medical History:  Diagnosis Date  . Anxiety   . CAD (coronary artery disease)    Anterior MI 2007 with DES stenting of an occluded LAD, distal 70% stenosis, OM 60% stenosis, circumflex 90% stenosis treated with a drug-eluting stent, PA 90% stenosis treated medically.  Cath January 2013 70-80% diagonal stenosis in a small vessel, OM 50% stenosis, PDA long 75% stenosis. EF 45%. He was managed medically.  . Cardiomyopathy, ischemic    EF 45%  . Diabetes mellitus   . History of chest pain 11/25/2010  . Hyperlipidemia   . Hypertension    Past Surgical History:  Procedure Laterality Date  . Catherization  08/25/11   Dr. Percival Spanish  . CORONARY ANGIOPLASTY WITH STENT PLACEMENT  207, 2011   4 stents placed  . LEFT HEART CATHETERIZATION WITH CORONARY ANGIOGRAM N/A 08/25/2011   Procedure: LEFT HEART CATHETERIZATION WITH CORONARY ANGIOGRAM;  Surgeon: Minus Breeding, MD;  Location: [facility] CATH LAB;  Service: Cardiovascular;  Laterality: N/A;   Social History   Socioeconomic History  . Marital status: Single    Spouse name: Not on file  . Number of children: Not on file  . Years of education: Not on file  . Highest education level: Not on file  Occupational History  . Not on file  Tobacco Use  . Smoking status: Never Smoker  . Smokeless tobacco: Never Used  Substance and Sexual  Activity  . Alcohol use: No  . Drug use: No  . Sexual activity: Not on file  Other Topics Concern  . Not on file  Social History Narrative  . Not on file   Social Determinants of Health   Financial Resource Strain:   . Difficulty of Paying Living Expenses: Not on file  Food Insecurity:   . Worried About Charity fundraiser in the Last Year: Not on file  . Ran Out of Food in the Last Year: Not on file  Transportation Needs:   . Lack of Transportation (Medical): Not on file  . Lack of Transportation (Non-Medical): Not on file  Physical Activity:   . Days of Exercise per Week: Not on file  . Minutes of Exercise per Session: Not on file  Stress:   . Feeling of Stress : Not on file  Social Connections:   . Frequency of Communication with Friends and Family: Not on file  . Frequency of Social Gatherings with Friends and Family: Not on file  . Attends Religious Services: Not on file  . Active Member of Clubs or Organizations: Not on file  . Attends Archivist Meetings: Not on file  . Marital Status: Not on file   Family History  Problem Relation Age of Onset  . Hypertension Mother   . Hypertension Father   . Diabetes Maternal Aunt   .  Diabetes Maternal Uncle   . Cancer Maternal Grandmother    Allergies  Allergen Reactions  . Shrimp [Shellfish Allergy] Swelling   Prior to Admission medications   Medication Sig Start Date End Date Taking? Authorizing Provider  ARIPiprazole (ABILIFY) 5 MG tablet Take 1 tablet (5 mg total) by mouth daily. 11/29/18  Yes Guadalupe Dawn, MD  aspirin 81 MG EC tablet Take 1 tablet (81 mg total) by mouth daily. 09/30/17  Yes Mikell, Jeani Sow, MD  Blood Glucose Monitoring Suppl (ONE TOUCH ULTRA 2) W/DEVICE KIT Check sugar once daily. ICD10 E11.65 01/24/15  Yes Leone Brand, MD  carvedilol (COREG) 25 MG tablet Take 1 tablet (25 mg total) by mouth 2 (two) times daily with a meal. 10/04/18  Yes Lockamy, Timothy, DO  citalopram (CELEXA) 20  MG tablet Take 1 tablet (20 mg total) by mouth 2 (two) times daily. 09/19/18  Yes Nuala Alpha, DO  clopidogrel (PLAVIX) 75 MG tablet Take 1 tablet (75 mg total) by mouth daily. 12/20/18  Yes Lockamy, Timothy, DO  Continuous Glucose Monitor KIT 1 kit by Does not apply route daily. 02/20/19  Yes Tammi Klippel, Sherin, DO  gabapentin (NEURONTIN) 100 MG capsule Take 1 capsule (100 mg total) by mouth 3 (three) times daily. 01/10/18  Yes Mikell, Jeani Sow, MD  GARLIC PO Take by mouth. Pt eats 2 garlic cloves a day mixed with applesauce   Yes [provider]  glucose blood (ONE TOUCH ULTRA TEST) test strip Check sugar once daily in the morning. ICD10 E11.65 01/10/18  Yes Mikell, Jeani Sow, MD  insulin glargine (LANTUS) 100 unit/mL SOPN Take 25 units daily 03/13/19  Yes Lockamy, Timothy, DO  isosorbide mononitrate (IMDUR) 30 MG 24 hr tablet Take 1 tablet (30 mg total) by mouth daily. 03/21/19  Yes Nuala Alpha, DO  JARDIANCE 25 MG TABS tablet Take 1 tablet by mouth once daily 04/06/19  Yes Lockamy, Timothy, DO  liraglutide (VICTOZA) 18 MG/3ML SOPN Inject 0.2 mLs (1.2 mg total) into the skin daily. 03/21/19  Yes Lockamy, Timothy, DO  metFORMIN (GLUCOPHAGE) 1000 MG tablet TAKE 1 TABLET BY MOUTH TWICE DAILY WITH A MEAL 09/19/18  Yes Lockamy, Timothy, DO  Multiple Vitamin (MULTIVITAMIN WITH MINERALS) TABS tablet Take 1 tablet by mouth daily.   Yes [provider]  nitroGLYCERIN (NITROSTAT) 0.4 MG SL tablet Place 1 tablet (0.4 mg total) under the tongue every 5 (five) minutes as needed. 10/04/18  Yes Lockamy, Timothy, DO  ONE TOUCH LANCETS MISC Use to check sugar once daily. ICD10 E11.65 01/09/15  Yes Willeen Niece, MD  pravastatin (PRAVACHOL) 40 MG tablet Take 1 tablet (40 mg total) by mouth every evening. 09/19/18 09/14/19 Yes Nuala Alpha, DO  Probiotic Product (PROBIOTIC PO) Take 1 tablet by mouth daily.   Yes [provider]  traZODone (DESYREL) 50 MG tablet Take 1 tablet (50 mg total)  by mouth at bedtime as needed for sleep. 11/29/18  Yes Guadalupe Dawn, MD  triamcinolone cream (KENALOG) 0.1 % Apply 1 application topically 2 (two) times daily. 09/30/17  Yes Mikell, Jeani Sow, MD     Positive ROS: Otherwise negative  All other systems have been reviewed and were otherwise negative with the exception of those mentioned in the HPI and as above.  Physical Exam: Constitutional: Alert, well-appearing, no acute distress Ears: External ears without lesions or tenderness. Ear canals are clear bilaterally with intact, clear TMs.  Nasal: External nose without lesions. Septum relatively midline.. Clear nasal passages Oral: Lips and  gums without lesions. Tongue and palate mucosa without lesions.  Floor of mouth was normal to evaluation.  Tongue was normal to evaluation.  Posterior oropharynx clear.  Left tonsil region is slightly larger than the right tonsillar region and has slight induration on palpation manually but could not identify definite malignancy or ulceration. Indirect laryngoscopy revealed a clear base of tongue, vallecula and epiglottis. Neck: Patient has a firm approximately 2 and half centimeter mass in the left submandibular region.  Fine-needle aspirate of the mass was performed in the office today.  We aspirated 2 to 3 cc of purulent material that was sent for cytology as well as culture.  No palpable adenopathy on the right side of the neck.  No palpable adenopathy lower on the left side of the neck.  No thyroid masses palpable or supra clavicular adenopathy noted. Respiratory: Breathing comfortably  Skin: No facial/neck lesions or rash noted. I reviewed the CT scan and it appears patient has some fullness in the left tonsillar region with possible necrotic left neck lymph node versus submandibular cyst although this has not really been painful.  FNA  Date/Time: 07/21/2019 5:42 PM Performed by: Rozetta Nunnery, MD Authorized by: Rozetta Nunnery, MD    Consent:    Consent obtained:  Verbal   Consent given by:  Patient   Risks discussed:  Bleeding, infection and pain Pre-procedure details:    Indications: lymphadenopathy   Anesthesia:    Anesthesia method:  Local infiltration   Local anesthetic:  1% Xylocaine with epi Procedure Details:    Location:  Left neck left submandibular region   Needle:  21 G   Lab: Fluid sent to cytology   Post-procedure details:    Patient tolerance of procedure:  Tolerated well, no immediate complications    Assessment: Left neck mass suspicious for malignant lymph node.  Plan: Fine-needle aspirate was performed in the office today.  Placed him on Keflex 500 mg 3 times daily for 10 days.  He will follow-up in 1 week for recheck and review FNA results.  If these are negative he may need surgical biopsy. I will plan on reviewing the CT scan with radiologist this next week.   Radene Journey, MD   CC:

## 2019-07-22 NOTE — Telephone Encounter (Signed)
error 

## 2019-07-24 LAB — CYTOLOGY - NON PAP

## 2019-07-25 ENCOUNTER — Other Ambulatory Visit: Payer: Self-pay | Admitting: Family Medicine

## 2019-07-25 ENCOUNTER — Other Ambulatory Visit: Payer: Self-pay

## 2019-07-25 ENCOUNTER — Telehealth: Payer: Self-pay | Admitting: Family Medicine

## 2019-07-25 DIAGNOSIS — E1165 Type 2 diabetes mellitus with hyperglycemia: Secondary | ICD-10-CM

## 2019-07-25 MED ORDER — JARDIANCE 25 MG PO TABS: 25.0000 mg | ORAL_TABLET | Freq: Every day | ORAL | 0 refills | Status: DC

## 2019-07-25 NOTE — Telephone Encounter (Signed)
Called patient to inform him that biopsy results appeared inflammatory rather than malignant. Patient appreciated call. Encouraged patient to keep ENT follow up.

## 2019-07-30 LAB — ANAEROBIC AND AEROBIC CULTURE: AER RESULT:: NO GROWTH

## 2019-07-31 ENCOUNTER — Encounter (INDEPENDENT_AMBULATORY_CARE_PROVIDER_SITE_OTHER): Payer: Self-pay | Admitting: Otolaryngology

## 2019-07-31 ENCOUNTER — Other Ambulatory Visit: Payer: Self-pay

## 2019-07-31 ENCOUNTER — Ambulatory Visit (INDEPENDENT_AMBULATORY_CARE_PROVIDER_SITE_OTHER): Payer: Medicare Other | Admitting: Otolaryngology

## 2019-07-31 VITALS — Temp 97.2°F

## 2019-07-31 DIAGNOSIS — R599 Enlarged lymph nodes, unspecified: Secondary | ICD-10-CM | POA: Diagnosis not present

## 2019-07-31 DIAGNOSIS — I255 Ischemic cardiomyopathy: Secondary | ICD-10-CM | POA: Diagnosis not present

## 2019-08-01 ENCOUNTER — Telehealth: Payer: Self-pay | Admitting: Cardiology

## 2019-08-01 ENCOUNTER — Other Ambulatory Visit: Payer: Self-pay | Admitting: *Deleted

## 2019-08-01 DIAGNOSIS — L209 Atopic dermatitis, unspecified: Secondary | ICD-10-CM

## 2019-08-01 MED ORDER — TRIAMCINOLONE ACETONIDE 0.1 % EX CREA: 1.0000 "application " | TOPICAL_CREAM | Freq: Two times a day (BID) | CUTANEOUS | 0 refills | Status: AC

## 2019-08-01 NOTE — Telephone Encounter (Signed)
Dr. Percival Spanish, this pt has hx of Anterior MI 2007 with DES to LAD and Circ. Cath January 2013 70-80% diagonal stenosis in a small vessel, OM 50% stenosis, PDA long 75% stenosis. EF 45%. He was managed medically. EF 45% with WMA consistent with old MI per echo 01/2018. Last seen by you via phone 02/09/2019.   He is planned for neck node biopsy and they request to hold Plavix for 3-4 days prior and 1 day after. Is this Ok with you? He is also on aspirin which is not requested to be held.   Please route response back to P CV DIV PREOP  Thanks

## 2019-08-01 NOTE — Telephone Encounter (Signed)
OK to hold Plavix as needed for the planned procedure

## 2019-08-01 NOTE — Telephone Encounter (Signed)
New Message     [facility] Pre-operative Risk Assessment    Request for surgical clearance:  1. What type of surgery is being performed? Neck Node Biopsy  2. When is this surgery scheduled? TBD   3. What type of clearance is required (medical clearance vs. Pharmacy clearance to hold med vs. Both)? Both  4. Are there any medications that need to be held prior to surgery and how long? Holding Plavix 3-4 days, restarting 1 day after procedure  5. Practice name and name of physician performing surgery? Dr. Melony Overly [facility]  6. What is your office phone number [phone]   7.   What is your office fax number [phone]  8.   Anesthesia type (None, local, MAC, general) ? General   Jenkins Rouge 08/01/2019, 12:50 PM  _________________________________________________________________   (provider comments below)

## 2019-08-01 NOTE — Progress Notes (Signed)
HPI: Paul [name] is a 48 y.o. male who returns today for evaluation of left neck node status post FNA for cytology and culture.  The cytology report revealed only changes consistent with inflammation.  Culture showed no growth.  He denies any sore throat or trouble swallowing.  No hoarseness.  No real tenderness associated with the left neck mass.  Past Medical History:  Diagnosis Date  . Anxiety   . CAD (coronary artery disease)    Anterior MI 2007 with DES stenting of an occluded LAD, distal 70% stenosis, OM 60% stenosis, circumflex 90% stenosis treated with a drug-eluting stent, PA 90% stenosis treated medically.  Cath January 2013 70-80% diagonal stenosis in a small vessel, OM 50% stenosis, PDA long 75% stenosis. EF 45%. He was managed medically.  . Cardiomyopathy, ischemic    EF 45%  . Diabetes mellitus   . History of chest pain 11/25/2010  . Hyperlipidemia   . Hypertension    Past Surgical History:  Procedure Laterality Date  . Catherization  08/25/11   Dr. Percival Spanish  . CORONARY ANGIOPLASTY WITH STENT PLACEMENT  207, 2011   4 stents placed  . LEFT HEART CATHETERIZATION WITH CORONARY ANGIOGRAM N/A 08/25/2011   Procedure: LEFT HEART CATHETERIZATION WITH CORONARY ANGIOGRAM;  Surgeon: Minus Breeding, MD;  Location: [facility] CATH LAB;  Service: Cardiovascular;  Laterality: N/A;   Social History   Socioeconomic History  . Marital status: Single    Spouse name: Not on file  . Number of children: Not on file  . Years of education: Not on file  . Highest education level: Not on file  Occupational History  . Not on file  Tobacco Use  . Smoking status: Never Smoker  . Smokeless tobacco: Never Used  Substance and Sexual Activity  . Alcohol use: No  . Drug use: No  . Sexual activity: Not on file  Other Topics Concern  . Not on file  Social History Narrative  . Not on file   Social Determinants of Health   Financial Resource Strain:   . Difficulty of Paying Living Expenses: Not  on file  Food Insecurity:   . Worried About Charity fundraiser in the Last Year: Not on file  . Ran Out of Food in the Last Year: Not on file  Transportation Needs:   . Lack of Transportation (Medical): Not on file  . Lack of Transportation (Non-Medical): Not on file  Physical Activity:   . Days of Exercise per Week: Not on file  . Minutes of Exercise per Session: Not on file  Stress:   . Feeling of Stress : Not on file  Social Connections:   . Frequency of Communication with Friends and Family: Not on file  . Frequency of Social Gatherings with Friends and Family: Not on file  . Attends Religious Services: Not on file  . Active Member of Clubs or Organizations: Not on file  . Attends Archivist Meetings: Not on file  . Marital Status: Not on file   Family History  Problem Relation Age of Onset  . Hypertension Mother   . Hypertension Father   . Diabetes Maternal Aunt   . Diabetes Maternal Uncle   . Cancer Maternal Grandmother    Allergies  Allergen Reactions  . Shrimp [Shellfish Allergy] Swelling   Prior to Admission medications   Medication Sig Start Date End Date Taking? Authorizing Provider  ARIPiprazole (ABILIFY) 5 MG tablet Take 1 tablet (5 mg total) by mouth daily. 11/29/18  Yes Guadalupe Dawn, MD  aspirin 81 MG EC tablet Take 1 tablet (81 mg total) by mouth daily. 09/30/17  Yes Mikell, Jeani Sow, MD  Blood Glucose Monitoring Suppl (ONE TOUCH ULTRA 2) W/DEVICE KIT Check sugar once daily. ICD10 E11.65 01/24/15  Yes Leone Brand, MD  carvedilol (COREG) 25 MG tablet Take 1 tablet (25 mg total) by mouth 2 (two) times daily with a meal. 10/04/18  Yes Lockamy, Timothy, DO  citalopram (CELEXA) 20 MG tablet Take 1 tablet (20 mg total) by mouth 2 (two) times daily. 09/19/18  Yes Nuala Alpha, DO  clopidogrel (PLAVIX) 75 MG tablet Take 1 tablet (75 mg total) by mouth daily. 12/20/18  Yes Lockamy, Timothy, DO  Continuous Glucose Monitor KIT 1 kit by Does not apply  route daily. 02/20/19  Yes Tammi Klippel, Sherin, DO  empagliflozin (JARDIANCE) 25 MG TABS tablet Take 25 mg by mouth daily. 07/25/19  Yes Bonnita Hollow, MD  gabapentin (NEURONTIN) 100 MG capsule Take 1 capsule (100 mg total) by mouth 3 (three) times daily. 01/10/18  Yes Mikell, Jeani Sow, MD  GARLIC PO Take by mouth. Pt eats 2 garlic cloves a day mixed with applesauce   Yes [provider]  glucose blood (ONE TOUCH ULTRA TEST) test strip Check sugar once daily in the morning. ICD10 E11.65 01/10/18  Yes Mikell, Jeani Sow, MD  insulin glargine (LANTUS) 100 unit/mL SOPN Take 25 units daily 03/13/19  Yes Lockamy, Timothy, DO  isosorbide mononitrate (IMDUR) 30 MG 24 hr tablet Take 1 tablet (30 mg total) by mouth daily. 03/21/19  Yes Lockamy, Timothy, DO  liraglutide (VICTOZA) 18 MG/3ML SOPN Inject 0.2 mLs (1.2 mg total) into the skin daily. 03/21/19  Yes Lockamy, Timothy, DO  metFORMIN (GLUCOPHAGE) 1000 MG tablet TAKE 1 TABLET BY MOUTH TWICE DAILY WITH A MEAL 09/19/18  Yes Lockamy, Timothy, DO  Multiple Vitamin (MULTIVITAMIN WITH MINERALS) TABS tablet Take 1 tablet by mouth daily.   Yes [provider]  nitroGLYCERIN (NITROSTAT) 0.4 MG SL tablet Place 1 tablet (0.4 mg total) under the tongue every 5 (five) minutes as needed. 10/04/18  Yes Lockamy, Timothy, DO  ONE TOUCH LANCETS MISC Use to check sugar once daily. ICD10 E11.65 01/09/15  Yes Willeen Niece, MD  pravastatin (PRAVACHOL) 40 MG tablet Take 1 tablet (40 mg total) by mouth every evening. 09/19/18 09/14/19 Yes Nuala Alpha, DO  Probiotic Product (PROBIOTIC PO) Take 1 tablet by mouth daily.   Yes [provider]  traZODone (DESYREL) 50 MG tablet Take 1 tablet (50 mg total) by mouth at bedtime as needed for sleep. 11/29/18  Yes Guadalupe Dawn, MD  triamcinolone cream (KENALOG) 0.1 % Apply 1 application topically 2 (two) times daily. 09/30/17  Yes Mikell, Jeani Sow, MD     Positive ROS: Otherwise negative  All other systems  have been reviewed and were otherwise negative with the exception of those mentioned in the HPI and as above.  Physical Exam: Constitutional: Alert, well-appearing, no acute distress Ears: External ears without lesions or tenderness. Ear canals are clear bilaterally with intact, clear TMs.  Nasal: External nose without lesions. Septum relatively midline.. Clear nasal passages.  No signs of infection Oral: Lips and gums without lesions. Tongue and palate mucosa without lesions. Posterior oropharynx clear.  Left tonsil is slightly larger than the right but no obvious mucosal lesions noted.  No ulcerations noted.  Indirect laryngoscopy revealed a clear base of tongue vallecula and epiglottis.  Vocal cords were clear bilaterally with  normal vocal cord mobility. Neck: Patient has an approximate 2-1/2 to 3 cm left submandibular neck mass.  No palpable adenopathy in the lower jugular chain or in the supraclavicular region. Respiratory: Breathing comfortably.  Lungs clear to auscultation Cardiac: Regular rate and rhythm Skin: No facial/neck lesions or rash noted.  Procedures  Assessment: Left neck mass suspicious for possible metastatic lymph node but otherwise clear upper airway examination clinically in the office as well as by the recent CT scan. History of cardiac disease status post placement of stents as well as congestive heart failure. Insulin-dependent diabetes  Plan: We will need to get cardiac clearance prior to surgical intervention which will require direct laryngoscopy and possible biopsy as well as excisional biopsy of left neck node. Patient is also on blood thinner which will need to be stopped   Radene Journey, MD

## 2019-08-02 NOTE — Telephone Encounter (Signed)
   Primary Cardiologist: Paul Breeding, MD  Chart reviewed as part of pre-operative protocol coverage. Patient was contacted 08/02/2019 in reference to pre-operative risk assessment for pending surgery as outlined below.  Paul [name] was last seen on 02/09/2019 via telephone by Dr. Percival Spanish.  Since that day, Paul [name] has done well with no new cardiac complaints.  Per Dr. Percival Spanish, Plavix can be held as needed for the planned procedure.  Therefore, based on ACC/AHA guidelines, the patient would be at acceptable risk for the planned procedure without further cardiovascular testing.   I will route this recommendation to the requesting party via Epic fax function and remove from pre-op pool.  Please call with questions.  Daune Perch, NP 08/02/2019, 10:18 AM

## 2019-08-07 ENCOUNTER — Encounter (INDEPENDENT_AMBULATORY_CARE_PROVIDER_SITE_OTHER): Payer: Self-pay

## 2019-08-07 NOTE — Pre-Procedure Instructions (Signed)
[address] [place], [place] - [address] [place] [address] Phone: [phone] Fax: [phone]  [facility] - [address] ([place]), [place] - [address] [place] ([place]) [place] [address] Phone: [phone] Fax: [phone]    Your procedure is scheduled on Mon. Jan. 4, 2021 from 7:30AM-9:30AM  Report to [facility] Entrance "A" at 5:30AM  Call this number if you have problems the morning of surgery:  [phone]   Remember:  Do not eat or drink after midnight on Jan. 3rd    Take these medicines the morning of surgery with A SIP OF WATER: ARIPiprazole (ABILIFY) Carvedilol (COREG) Citalopram (CELEXA) Gabapentin (NEURONTIN)   Isosorbide mononitrate (IMDUR)  If Needed:  NitroGLYCERIN (NITROSTAT)  Take last dose of Plavix on 08/10/2019 Follow your surgeon's instructions on when to stop Aspirin.  If no instructions were given by your surgeon then you will need to call the office to get those instructions.    As of today, stop taking all Aspirin (unless instructed by your doctor) and Other Aspirin containing products, Vitamins, Fish oils, and Herbal medications. Also stop all NSAIDS i.e. Advil, Ibuprofen, Motrin, Aleve, Anaprox, Naproxen, BC, Goody Powders, and all Supplements.   . Take last dose of Empagliflozin (JARDIANCE) on 08/12/2019.  Marland Kitchen Do not take MetFORMIN (GLUCOPHAGE) the morning of surgery.  . THE NIGHT BEFORE SURGERY, take ____12_______ units of ____Lantus_______insulin.  . THE MORNING OF SURGERY, take _____12________ units of ___Lantus_______insulin.  . The day of surgery, do not take other diabetes injectables Victoza (liraglutide).  Why is it important to control my blood sugar before and after surgery? . Improving blood sugar levels before and after surgery helps healing and can limit problems. . A way of improving blood sugar control is eating a healthy diet by: o  Eating less sugar  and carbohydrates o  Increasing activity/exercise o  Talking with your doctor about reaching your blood sugar goals . High blood sugars (greater than 180 mg/dL) can raise your risk of infections and slow your recovery, so you will need to focus on controlling your diabetes during the weeks before surgery. . Make sure that the doctor who takes care of your diabetes knows about your planned surgery including the date and location.  How do I manage my blood sugar before surgery? . Check your blood sugar at least 4 times a day, starting 2 days before surgery, to make sure that the level is not too high or low. o Check your blood sugar the morning of your surgery when you wake up and every 2 hours until you get to the Short Stay unit. . If your blood sugar is less than 70 mg/dL, you will need to treat for low blood sugar: o Do not take insulin. o Treat a low blood sugar (less than 70 mg/dL) with  cup of clear juice (cranberry or apple), 4 glucose tablets, OR glucose gel. Recheck blood sugar in 15 minutes after treatment (to make sure it is greater than 70 mg/dL). If your blood sugar is not greater than 70 mg/dL on recheck, call [phone] o  for further instructions. .  If your CBG is greater than 220 mg/dL, inform the staff upon arrival to Short Stay.  . If you are admitted to the hospital after surgery: o Your blood sugar will be checked by the staff and you will probably be given insulin after surgery (instead of oral diabetes medicines) to make sure you  have good blood sugar levels. o The goal for blood sugar control after surgery is 80-180 mg/dL.  Reviewed and Endorsed by [facility] Patient Education Committee, August 2015  No Smoking of any kind, Tobacco, or Alcohol products 24 hours prior to your procedure. If you use a Cpap at night, you may bring the machine and all equipment for your overnight stay.  Special instructions: [place]- Preparing For Surgery  Before surgery, you  can play an important role. Because skin is not sterile, your skin needs to be as free of germs as possible. You can reduce the number of germs on your skin by washing with CHG (chlorahexidine gluconate) Soap before surgery.  CHG is an antiseptic cleaner which kills germs and bonds with the skin to continue killing germs even after washing.    Please do not use if you have an allergy to CHG or antibacterial soaps. If your skin becomes reddened/irritated stop using the CHG.  Do not shave (including legs and underarms) for at least 48 hours prior to first CHG shower. It is OK to shave your face.  Please follow these instructions carefully.   1. Shower the NIGHT BEFORE SURGERY and the MORNING OF SURGERY with CHG.   2. If you chose to wash your hair, wash your hair first as usual with your normal shampoo.  3. After you shampoo, rinse your hair and body thoroughly to remove the shampoo.  4. Use CHG as you would any other liquid soap. You can apply CHG directly to the skin and wash gently with a scrungie or a clean washcloth.   5. Apply the CHG Soap to your body ONLY FROM THE NECK DOWN.  Do not use on open wounds or open sores. Avoid contact with your eyes, ears, mouth and genitals (private parts). Wash Face and genitals (private parts)  with your normal soap.  6. Wash thoroughly, paying special attention to the area where your surgery will be performed.  7. Thoroughly rinse your body with warm water from the neck down.  8. DO NOT shower/wash with your normal soap after using and rinsing off the CHG Soap.  9. Pat yourself dry with a CLEAN TOWEL.  10. Wear CLEAN PAJAMAS to bed the night before surgery, wear comfortable clothes the morning of surgery  11. Place CLEAN SHEETS on your bed the night of your first shower and DO NOT SLEEP WITH PETS.   Day of Surgery:            Remember to brush your teeth WITH YOUR REGULAR TOOTHPASTE.  Do not wear jewelry.  Do not wear lotions, powders, colognes,  or deodorant.  Do not shave 48 hours prior to surgery.  Men may shave face and neck.  Do not bring valuables to the hospital.  [facility] is not responsible for any belongings or valuables.  Contacts, dentures or bridgework may not be worn into surgery.   For patients admitted to the hospital, discharge time will be determined by your treatment team.  Patients discharged the day of surgery will not be allowed to drive home, and someone age 71 and over needs to stay with them for 24 hours.  Please wear clean clothes to the hospital/surgery center.    Please read over the following fact sheets that you were given.

## 2019-08-08 ENCOUNTER — Other Ambulatory Visit: Payer: Self-pay

## 2019-08-08 ENCOUNTER — Encounter (HOSPITAL_COMMUNITY)
Admission: RE | Admit: 2019-08-08 | Discharge: 2019-08-08 | Disposition: A | Discharge status: Home / Self Care | Payer: Medicare Other | Source: Ambulatory Visit | Attending: Otolaryngology | Admitting: Otolaryngology

## 2019-08-08 ENCOUNTER — Telehealth: Payer: Self-pay | Admitting: Cardiology

## 2019-08-08 ENCOUNTER — Encounter (HOSPITAL_COMMUNITY): Payer: Self-pay | Admitting: Otolaryngology

## 2019-08-08 DIAGNOSIS — Z01812 Encounter for preprocedural laboratory examination: Secondary | ICD-10-CM | POA: Insufficient documentation

## 2019-08-08 DIAGNOSIS — E119 Type 2 diabetes mellitus without complications: Secondary | ICD-10-CM | POA: Insufficient documentation

## 2019-08-08 LAB — CBC
HCT: 43.9 % (ref 39.0–52.0)
Hemoglobin: 13.6 g/dL (ref 13.0–17.0)
MCH: 25.7 pg — ABNORMAL LOW (ref 26.0–34.0)
MCHC: 31 g/dL (ref 30.0–36.0)
MCV: 83 fL (ref 80.0–100.0)
Platelets: 295 10*3/uL (ref 150–400)
RBC: 5.29 MIL/uL (ref 4.22–5.81)
RDW: 17.1 % — ABNORMAL HIGH (ref 11.5–15.5)
WBC: 7.2 10*3/uL (ref 4.0–10.5)
nRBC: 0 % (ref 0.0–0.2)

## 2019-08-08 LAB — BASIC METABOLIC PANEL
Anion gap: 9 (ref 5–15)
BUN: 19 mg/dL (ref 6–20)
CO2: 28 mmol/L (ref 22–32)
Calcium: 9 mg/dL (ref 8.9–10.3)
Chloride: 97 mmol/L — ABNORMAL LOW (ref 98–111)
Creatinine, Ser: 1.08 mg/dL (ref 0.61–1.24)
GFR calc Af Amer: >60 mL/min (ref >60)
GFR calc non Af Amer: >60 mL/min (ref >60)
Glucose, Bld: 204 mg/dL — ABNORMAL HIGH (ref 70–99)
Potassium: 3.7 mmol/L (ref 3.5–5.1)
Sodium: 134 mmol/L — ABNORMAL LOW (ref 135–145)

## 2019-08-08 LAB — GLUCOSE, CAPILLARY: Glucose-Capillary: 211 mg/dL — ABNORMAL HIGH (ref 70–99)

## 2019-08-08 NOTE — Anesthesia Preprocedure Evaluation (Addendum)
Anesthesia Evaluation  Patient identified by MRN, date of birth, ID band Patient awake    Reviewed: Allergy & Precautions, NPO status , Patient's Chart, lab work & pertinent test results, reviewed documented beta blocker date and time   Airway Mallampati: II  TM Distance: >3 FB Neck ROM: Full    Dental no notable dental hx. (+) Teeth Intact   Pulmonary  Left neck mass Hx/o TB, treated   Pulmonary exam normal breath sounds clear to auscultation       Cardiovascular hypertension, Pt. on medications and Pt. on home beta blockers + CAD, + Past MI and + Cardiac Stents  Normal cardiovascular exam Rhythm:Regular Rate:Normal  Ischemic CM EF45% MI Ant. Wall 2007 PCI 2003 stent x2, 2007 stent x2  EKG 02/20/2019 ST, inferior infarct, PVC's, lateral ischemia  ECHO 02/04/2018 Left ventricle: Septal and apical akinesis. The cavity size wall mildly dilated. Wall thickness was increased in a pattern of mild LVH. Systolic function was mildly to moderately reduced. The estimated ejection fraction was in the range of 40% to 45%. Left ventricular diastolic function parameters were normal. - Atrial septum: No defect or patent foramen ovale was identified   Neuro/Psych PSYCHIATRIC DISORDERS Anxiety Bipolar Disorder Hx/o PTSDDiabetic peripheral neuropathy    GI/Hepatic   Endo/Other  diabetes, Poorly Controlled, Type 2, Oral Hypoglycemic Agents, Insulin DependentHyperlipidemia  Renal/GU   negative genitourinary   Musculoskeletal negative musculoskeletal ROS (+)   Abdominal   Peds  Hematology Plavix therapy- last dose   Anesthesia Other Findings   Reproductive/Obstetrics                          Anesthesia Physical Anesthesia Plan  ASA: III  Anesthesia Plan: General   Post-op Pain Management:    Induction: Intravenous  PONV Risk Score and Plan: 4 or greater and Ondansetron, Dexamethasone, Treatment may vary  due to age or medical condition and Midazolam  Airway Management Planned: Oral ETT  Additional Equipment:   Intra-op Plan:   Post-operative Plan: Extubation in OR  Informed Consent: I have reviewed the patients History and Physical, chart, labs and discussed the procedure including the risks, benefits and alternatives for the proposed anesthesia with the patient or authorized representative who has indicated his/her understanding and acceptance.     Dental advisory given  Plan Discussed with: CRNA and Surgeon  Anesthesia Plan Comments: (See PAT note written by Myra Gianotti, PA-C. )    Anesthesia Quick Evaluation

## 2019-08-08 NOTE — Telephone Encounter (Signed)
SPOKE TO ALLISON PA AT  PRE OP AT [facility].  Paul [name]  States patient is there now with  Low blood pressure  Only complaint is that he is tired. Recent had diarrhea last week. Patient was taking  Antibiotics but stopped them  Because he thought the diarrhea was i was coming from the antibiotics.  patient is taking coreg and quinapril/hctz . He has taken both dose of coreg today and  The quinapril/hctz.   Would like recommendation from  Dr Percival Spanish or another doctor.  RN informed Paul [name] will review with Doctor of Day.  reviewed  With Dr Debara Pickett. Recommended to hydrate now  tmorrow and hold coreg and quinapril/hctz tomorrow.  contact  Dr Pollie Friar office to inform them that he did complete antibiotics.   Paul [name] verbalized understanding and will inform patient

## 2019-08-08 NOTE — Progress Notes (Addendum)
Anesthesia PAT Evaluation:  Case: [id number] Date/Time: 08/14/19 0715   Procedures:      DIRECT LARYNGOSCOPY (Left )     BIOPSY EXCISION OF LEFT NECK MASS (Left )   Anesthesia type: General   Pre-op diagnosis: LEFT NECK MASS   Location: [place] OR ROOM 09 / [place] OR   Surgeons: Rozetta Nunnery, MD      DISCUSSION: Patient is a 48 year male scheduled for the above procedure. He has a left neck mass. FNA 07/21/19 showed acute inflammation and cultures showed no growth. Definitive tissue diagnosis desired.    History include never smoker, HTN, CAD (anterior MI 2007, s/p DES to occluded LAD 10/19/05 with staged DES and medical therapy for 90% small PDA 10/22/05; Diffuse nonobstructive coronary disease with patent stents, EF 45% felt consistent with mixed ischemic and nonischemic cardiomyopathy secondary to hypertension 08/24/09), DM2, HLD, TB (1989, s/p treatment), PTSD, Bipolar disorder.   I evaluated patient during his PAT visit due to hypotension, without acute symptoms. He had been on antibiotics (doesn't known name) for his left neck mass which caused some diarrhea, so he stopped on his own after 07/31/19 (advised that he let Dr. Lucia Gaskins know). Although diarrhea frequency has improved, still with occasional loose stools, but feels his oral intake has been good. He has felt generally weak for several days though, but no chest pain, SOB, dizziness, syncope. He has not felt that his symptoms were so severe that he needed to contact a medical doctor or go to the ED/urgent care (and confirmed that again at his PAT visit). He has been having right leg cramping with walking for several months now, but it sounds likes full work-up has not taken place yet (vascular versus neurogenic claudication ??). Given his RLE pain, he feels his balance has not been as good when he first stands, but doesn't describe this as feeling dizzy. He has been prescribed Neurontin for his leg symptoms, but hasn't started it yet.  He denied  cough or fever. He has had some mild LE edema, but nothing significantly changed from his baseline. He does not currently have a BP cuff where he is staying, but says he should be able to purchase one by 08/11/19.  He is compliant with his BP meds which include Coreg, Imdur, and quinapril-HCTZ. He takes first morning medications between ~ 7:00-8:00 AM and PM Coreg dose in the afternoon (had taken shortly before his PAT visit).  SBP readings at PAT primarily in the 70's-80's range. Best reading documented was 97/74 with lowest 70/50.  Modified orthostatics showed BP 73/53 LUE sitting and up to 77/51 with standing. HR stayed in the 70's and regular without ectopic on auscultation. (He had both manual and automatic BP readings and BP checks in both arms with consistent results by PAT RN.) He was alert and oriented. Even laughing during part of the exam. He denied dizziness with standing. He was able to walk independently. He was given approximately 500 mL of water (including ~ 60 mL of juice) to drink while at PAT. His CBG was 211 on arrival. O2 sats 96-98%. He uses public transportation and is taking the bus home from PAT.   Given he is recovering from loose stools, likely associated with antibiotics, it is possible he is somewhat dehydrated, although BUN/Cr did come back normal at 19/1.08 and modified orthostatics not positive. He had 2-3+ bilateral radial pulses and only 1+ brachial and less on the RUE, so could consider calcified vessels as a  contributing factor in low readings--although last few SBP readings I found were not as low--104/60, 115/74, 116/78 and his reported previous home readings were reported as SBP ~ 120. He denied any new BP medication adjustment or new pain medications. He is not planning to start gabapentin until his BP improves. I contacted [facility] and spoke with triage nurse Ivin Booty regarding BP readings to inquire about any additional recommendations regarding BP medications. She  discussed with DOD Lyman Bishop, MD, and patient to stay well hydrated and hold Coreg and Accuretic on 08/09/19 (he has already taken doses for 08/08/19). He will try to get a BP cuff before 08/11/19 and contact Dr. Rosezella [place] office if reading are consistently low and seek immediate medical attention if he become symptomatic.      Preoperative cardiology input outlined by Daune Perch, NP on 08/02/19: "Paul [name] was last seen on 02/09/2019 via telephone by Dr. Percival Spanish.  Since that day, Paul [name] has done well with no new cardiac complaints.  Per Dr. Percival Spanish, Plavix can be held as needed for the planned procedure.  Therefore, based on ACC/AHA guidelines, the patient would be at acceptable risk for the planned procedure without further cardiovascular testing."  He reported instructions for last Plavix 08/10/19.   CBG 211 at PAT. A1c 7.6%. He does not check home CBGs.   Presurgical COVID-19 test is scheduled for 08/10/19.   VS: BP 97/74   Pulse 76   Temp 36.4 C   Resp 18   Ht 5' 11"  (1.803 m)   Wt 69.6 kg   SpO2 96%   BMI 21.41 kg/m  Heart RRR, no murmur or ectopy noted. Lungs clear. He does have a left neck mass, but clear speech (and denied dysphagia). Mild ankle edema, BLE. Ambulated independently and answered questions appropriately at PAT visit. I wore a N95 and face shield during visit.     PROVIDERS: Nuala Alpha, DO is PCP  Minus Breeding, MD is cardiologist   LABS: Labs reviewed: Acceptable for surgery. A1c 7.6% 07/14/19.  (all labs ordered are listed, but only abnormal results are displayed)  Labs Reviewed  GLUCOSE, CAPILLARY - Abnormal; Notable for the following components:      Result Value   Glucose-Capillary 211 (*)    All other components within normal limits  BASIC METABOLIC PANEL  CBC     IMAGES: CT soft tissue neck 07/18/19: IMPRESSION: 1. Palpable mass left submandibular region measures 26 x 16 mm. Cystic change within the mass  with enhancing solid component. Favor malignant lymph node over submandibular mass. Biopsy recommended. No pharyngeal mass. Direct pharyngeal mucosal evaluation recommended. No other adenopathy. 2. Atherosclerotic disease carotid artery bilaterally. Mild to moderate stenosis left internal carotid artery.   EKG: 02/20/19:  Sinus tachycardia with frequent Premature ventricular complexes Inferior infarct , age undetermined T wave abnormality, consider lateral ischemia Since previous tracing 08/25/11 anterior septal infarct NO LONGER PRESENT Premature ventricular complexes are new - He had more recent comparison tracing done at [facility] on 06/22/16 and 01/14/18 that also showed lateral T wave abnormality and possible inferior infarct.    CV: Echo 02/04/18: Study Conclusions - Left ventricle: Septal and apical akinesis. The cavity size was   mildly dilated. Wall thickness was increased in a pattern of mild   LVH. Systolic function was mildly to moderately reduced. The   estimated ejection fraction was in the range of 40% to 45%. Left   ventricular diastolic function parameters were normal. - Atrial septum: No defect or patent  foramen ovale was identified. (Comparison: LVEF 40-45%, apical and mid-distal septal akinesis 09/16/09; and "EF is mildly reduced but unchanged" 4/11/4 echo)    Cardiac cath 08/25/11: Coronary angiography:  Coronary dominance: Right Left mainstem:   Normal Left anterior descending (LAD):   Proximal stent patent with diffuse luminal irregularities. Diffuse mid and distal disease with moderately severe apical stenosis. Mid and distal disease is nonobstructive. First diagonal is small and branching. There is proximal long 70-80% stenosis. Second diagonal is small with ostial 80% stenosis. Left circumflex (LCx):  Proximal AV groove luminal irregularities. Mid stent in the AV groove is widely patent with luminal irregularities. Large mid obtuse marginal with proximal  50% stenosis, mid 40% stenosis and distal nonobstructive disease. Large posterior lateral has ostial 30% stenosis and mid 25% stenosis. Right coronary artery (RCA):  The right coronary artery proximal long 30% stenosis. There is catheter induced vasospasm. Ostial flush injection demonstrated no fixed ostial disease. Mid RCA long 25-30% stenosis. PDA is small with diffuse disease and proximal 75% stenosis. Left ventriculography: Left ventricular systolic function is normal, LVEF is estimated at 45% with mild global hypokinesis, there is ectopy-induced mitral regurgitation. Aortogram:  Distal aortogram demonstrated widely patent renals with no obstructive disease. Conclusions:   Diffuse nonobstructive coronary disease with patent stents. He does have some small vessel disease which could be causing symptoms. His renal arteries were widely patent. His EF, as before, is mildly reduced. This is consistent with a mixed ischemic and nonischemic cardiomyopathy secondary to hypertension. Recommendations:  He will need aggressive medical management. He will need compliance with medications.   Past Medical History:  Diagnosis Date  . Anxiety   . Bipolar disorder ([place])   . CAD (coronary artery disease)    Anterior MI 2007 with DES stenting of an occluded LAD, distal 70% stenosis, OM 60% stenosis, circumflex 90% stenosis treated with a drug-eluting stent, PA 90% stenosis treated medically.  Cath January 2013 70-80% diagonal stenosis in a small vessel, OM 50% stenosis, PDA long 75% stenosis. EF 45%. He was managed medically.  . Cardiomyopathy, ischemic    EF 45%  . Diabetes mellitus   . History of chest pain 11/25/2010  . Hyperlipidemia   . Hypertension   . Myocardial infarction ([place])   . PTSD (post-traumatic stress disorder)   . Tuberculosis    1989, took pills for it     Past Surgical History:  Procedure Laterality Date  . Catherization  08/25/11   Dr. Percival Spanish  . CORONARY ANGIOPLASTY WITH STENT  PLACEMENT  207, 2011   4 stents placed  . LEFT HEART CATHETERIZATION WITH CORONARY ANGIOGRAM N/A 08/25/2011   Procedure: LEFT HEART CATHETERIZATION WITH CORONARY ANGIOGRAM;  Surgeon: Minus Breeding, MD;  Location: [facility] CATH LAB;  Service: Cardiovascular;  Laterality: N/A;    MEDICATIONS: . ARIPiprazole (ABILIFY) 5 MG tablet  . aspirin 81 MG EC tablet  . Blood Glucose Monitoring Suppl (ONE TOUCH ULTRA 2) W/DEVICE KIT  . carvedilol (COREG) 25 MG tablet  . citalopram (CELEXA) 20 MG tablet  . clopidogrel (PLAVIX) 75 MG tablet  . Continuous Glucose Monitor KIT  . empagliflozin (JARDIANCE) 25 MG TABS tablet  . gabapentin (NEURONTIN) 100 MG capsule  . glucose blood (ONE TOUCH ULTRA TEST) test strip  . insulin glargine (LANTUS) 100 unit/mL SOPN  . isosorbide mononitrate (IMDUR) 30 MG 24 hr tablet  . liraglutide (VICTOZA) 18 MG/3ML SOPN  . metFORMIN (GLUCOPHAGE) 1000 MG tablet  . Multiple Vitamin (MULTIVITAMIN WITH MINERALS) TABS tablet  .  nitroGLYCERIN (NITROSTAT) 0.4 MG SL tablet  . ONE TOUCH LANCETS MISC  . pravastatin (PRAVACHOL) 40 MG tablet  . quinapril-hydrochlorothiazide (ACCURETIC) 20-25 MG tablet  . traZODone (DESYREL) 50 MG tablet  . triamcinolone cream (KENALOG) 0.1 %   No current facility-administered medications for this encounter.    Myra Gianotti, PA-C Surgical Short Stay/Anesthesiology [facility] Phone [phone] [facility] Phone [phone] 08/08/2019 5:31 PM

## 2019-08-08 NOTE — Progress Notes (Signed)
PCP - Dr. Nuala Alpha Cardiologist - Dr. Minus Breeding  PPM/ICD - denies Device Orders - N/A  Rep Notified - N/A  Chest x-ray - N/A EKG - 02/20/2019 Stress Test -2012 ECHO - 02/04/18 Cardiac Cath - 2013  Sleep Study - denies CPAP - N/A  Fasting Blood glucose: per patient, does not check blood sugar  Blood Thinner Instructions: Plavix: LD: 08/10/2019 Aspirin Instructions: Patient instructed to call surgeon's office for instructions on when/if to stop  ERAS Protcol - No pre-op orders  PRE-SURGERY Ensure or G2- No pre-op orders  COVID TEST- Scheduled for 08/10/2019. Patient verbalized understanding of self-quarantine instructions, appointment time and place.  Anesthesia review: YES, cardiac clearance, heart hx  Low blood pressure at PAT appointment, rechecked numerous times ranging from 11H-41D [id number]/85-63'J diastolic. Rechecked manually and on both arms. Patient only c/o feeling tired, no dizziness, light-headedness, chest pain, or breathing problems. Ebony Hail, [place] assessed patient, cardiologist's office contacted, instructions given to patient about BP meds and checking BP for the upcoming days. Patient also instructed to seek emergency services if begins to feel symptomatic. Patient verbalized understanding of instructions.  Patient walked out to main entrance by two NT's.  Patient denies shortness of breath, fever, cough and chest pain at PAT appointment  All instructions explained to the patient, with a verbal understanding of the material. Patient agrees to go over the instructions while at home for a better understanding. Patient also instructed to self quarantine after being tested for COVID-19. The opportunity to ask questions was provided.

## 2019-08-08 NOTE — Telephone Encounter (Signed)
Ebony Hail from [facility] calling in regards to the patient's blood pressure being low. She states it is in the mid 70's and would like to discuss this with a nurse.

## 2019-08-10 ENCOUNTER — Other Ambulatory Visit (HOSPITAL_COMMUNITY)
Admission: RE | Admit: 2019-08-10 | Discharge: 2019-08-10 | Disposition: A | Discharge status: Home / Self Care | Payer: Medicare Other | Source: Ambulatory Visit | Attending: Otolaryngology | Admitting: Otolaryngology

## 2019-08-10 DIAGNOSIS — Z20828 Contact with and (suspected) exposure to other viral communicable diseases: Secondary | ICD-10-CM | POA: Diagnosis not present

## 2019-08-10 DIAGNOSIS — Z01812 Encounter for preprocedural laboratory examination: Secondary | ICD-10-CM | POA: Diagnosis present

## 2019-08-11 LAB — NOVEL CORONAVIRUS, NAA (HOSP ORDER, SEND-OUT TO REF LAB; TAT 18-24 HRS): SARS-CoV-2, NAA: NOT DETECTED

## 2019-08-13 ENCOUNTER — Encounter (HOSPITAL_COMMUNITY): Payer: Self-pay | Admitting: Otolaryngology

## 2019-08-14 ENCOUNTER — Ambulatory Visit (HOSPITAL_COMMUNITY): Payer: Medicare Other | Admitting: Anesthesiology

## 2019-08-14 ENCOUNTER — Encounter (HOSPITAL_COMMUNITY)
Admission: RE | Disposition: A | Discharge status: Home / Self Care | Payer: Self-pay | Source: Home / Self Care | Attending: Otolaryngology

## 2019-08-14 ENCOUNTER — Ambulatory Visit (HOSPITAL_COMMUNITY)
Admission: RE | Admit: 2019-08-14 | Discharge: 2019-08-14 | Disposition: A | Discharge status: Home / Self Care | Payer: Medicare Other | Attending: Otolaryngology | Admitting: Otolaryngology

## 2019-08-14 ENCOUNTER — Other Ambulatory Visit: Payer: Self-pay

## 2019-08-14 ENCOUNTER — Ambulatory Visit (HOSPITAL_COMMUNITY): Payer: Medicare Other | Admitting: Vascular Surgery

## 2019-08-14 ENCOUNTER — Encounter (HOSPITAL_COMMUNITY): Payer: Self-pay | Admitting: Otolaryngology

## 2019-08-14 DIAGNOSIS — E119 Type 2 diabetes mellitus without complications: Secondary | ICD-10-CM | POA: Diagnosis not present

## 2019-08-14 DIAGNOSIS — I10 Essential (primary) hypertension: Secondary | ICD-10-CM | POA: Diagnosis not present

## 2019-08-14 DIAGNOSIS — F419 Anxiety disorder, unspecified: Secondary | ICD-10-CM | POA: Diagnosis not present

## 2019-08-14 DIAGNOSIS — F319 Bipolar disorder, unspecified: Secondary | ICD-10-CM | POA: Insufficient documentation

## 2019-08-14 DIAGNOSIS — I2510 Atherosclerotic heart disease of native coronary artery without angina pectoris: Secondary | ICD-10-CM | POA: Insufficient documentation

## 2019-08-14 DIAGNOSIS — Z794 Long term (current) use of insulin: Secondary | ICD-10-CM | POA: Diagnosis not present

## 2019-08-14 DIAGNOSIS — G629 Polyneuropathy, unspecified: Secondary | ICD-10-CM | POA: Diagnosis not present

## 2019-08-14 DIAGNOSIS — I252 Old myocardial infarction: Secondary | ICD-10-CM | POA: Insufficient documentation

## 2019-08-14 DIAGNOSIS — F4310 Post-traumatic stress disorder, unspecified: Secondary | ICD-10-CM | POA: Diagnosis not present

## 2019-08-14 DIAGNOSIS — Z7982 Long term (current) use of aspirin: Secondary | ICD-10-CM | POA: Insufficient documentation

## 2019-08-14 DIAGNOSIS — E785 Hyperlipidemia, unspecified: Secondary | ICD-10-CM | POA: Diagnosis not present

## 2019-08-14 DIAGNOSIS — Z79899 Other long term (current) drug therapy: Secondary | ICD-10-CM | POA: Diagnosis not present

## 2019-08-14 DIAGNOSIS — Z955 Presence of coronary angioplasty implant and graft: Secondary | ICD-10-CM | POA: Diagnosis not present

## 2019-08-14 DIAGNOSIS — R590 Localized enlarged lymph nodes: Secondary | ICD-10-CM | POA: Diagnosis not present

## 2019-08-14 DIAGNOSIS — R221 Localized swelling, mass and lump, neck: Secondary | ICD-10-CM

## 2019-08-14 DIAGNOSIS — Z7902 Long term (current) use of antithrombotics/antiplatelets: Secondary | ICD-10-CM | POA: Diagnosis not present

## 2019-08-14 DIAGNOSIS — I255 Ischemic cardiomyopathy: Secondary | ICD-10-CM | POA: Insufficient documentation

## 2019-08-14 DIAGNOSIS — L040 Acute lymphadenitis of face, head and neck: Secondary | ICD-10-CM | POA: Diagnosis not present

## 2019-08-14 DIAGNOSIS — K140 Glossitis: Secondary | ICD-10-CM | POA: Diagnosis not present

## 2019-08-14 HISTORY — DX: Respiratory tuberculosis unspecified: A15.9

## 2019-08-14 HISTORY — PX: DIRECT LARYNGOSCOPY: SHX5326

## 2019-08-14 HISTORY — DX: Post-traumatic stress disorder, unspecified: F43.10

## 2019-08-14 HISTORY — DX: Bipolar disorder, unspecified: F31.9

## 2019-08-14 HISTORY — PX: MASS BIOPSY: SHX5445

## 2019-08-14 LAB — GLUCOSE, CAPILLARY
Glucose-Capillary: 112 mg/dL — ABNORMAL HIGH (ref 70–99)
Glucose-Capillary: 192 mg/dL — ABNORMAL HIGH (ref 70–99)

## 2019-08-14 SURGERY — LARYNGOSCOPY, DIRECT
Anesthesia: General | Laterality: Left

## 2019-08-14 MED ORDER — DEXAMETHASONE SODIUM PHOSPHATE 10 MG/ML IJ SOLN
INTRAMUSCULAR | Status: DC | PRN
  Administered 2019-08-14: 4 mg via INTRAVENOUS

## 2019-08-14 MED ORDER — CEFAZOLIN SODIUM-DEXTROSE 2-4 GM/100ML-% IV SOLN
INTRAVENOUS | Status: AC
  Filled 2019-08-14: qty 100

## 2019-08-14 MED ORDER — LIDOCAINE-EPINEPHRINE 1 %-1:100000 IJ SOLN
INTRAMUSCULAR | Status: AC
  Filled 2019-08-14: qty 1

## 2019-08-14 MED ORDER — EPINEPHRINE HCL (NASAL) 0.1 % NA SOLN
NASAL | Status: AC
  Filled 2019-08-14: qty 30

## 2019-08-14 MED ORDER — HYDROCODONE-ACETAMINOPHEN 5-325 MG PO TABS: 1.0000 | ORAL_TABLET | Freq: Four times a day (QID) | ORAL | 0 refills | Status: DC | PRN

## 2019-08-14 MED ORDER — MIDAZOLAM HCL 5 MG/5ML IJ SOLN
INTRAMUSCULAR | Status: DC | PRN
  Administered 2019-08-14: 2 mg via INTRAVENOUS

## 2019-08-14 MED ORDER — PHENYLEPHRINE 40 MCG/ML (10ML) SYRINGE FOR IV PUSH (FOR BLOOD PRESSURE SUPPORT)
PREFILLED_SYRINGE | INTRAVENOUS | Status: AC
  Filled 2019-08-14: qty 10

## 2019-08-14 MED ORDER — CLINDAMYCIN HCL 300 MG PO CAPS: 300.0000 mg | ORAL_CAPSULE | Freq: Three times a day (TID) | ORAL | 0 refills | Status: AC

## 2019-08-14 MED ORDER — LIDOCAINE-EPINEPHRINE 1 %-1:100000 IJ SOLN
INTRAMUSCULAR | Status: DC | PRN
  Administered 2019-08-14: 4 mL

## 2019-08-14 MED ORDER — CEFAZOLIN SODIUM-DEXTROSE 2-4 GM/100ML-% IV SOLN
2.0000 g | Freq: Once | INTRAVENOUS | Status: AC
  Administered 2019-08-14: 08:00:00 2 g via INTRAVENOUS

## 2019-08-14 MED ORDER — SUGAMMADEX SODIUM 200 MG/2ML IV SOLN: INTRAVENOUS | Status: DC | PRN

## 2019-08-14 MED ORDER — ROCURONIUM BROMIDE 10 MG/ML (PF) SYRINGE
PREFILLED_SYRINGE | INTRAVENOUS | Status: DC | PRN
  Administered 2019-08-14: 20 mg via INTRAVENOUS
  Administered 2019-08-14: 10 mg via INTRAVENOUS
  Administered 2019-08-14: 40 mg via INTRAVENOUS

## 2019-08-14 MED ORDER — FENTANYL CITRATE (PF) 100 MCG/2ML IJ SOLN
INTRAMUSCULAR | Status: DC | PRN
  Administered 2019-08-14: 150 ug via INTRAVENOUS
  Administered 2019-08-14: 50 ug via INTRAVENOUS

## 2019-08-14 MED ORDER — SODIUM CHLORIDE 0.9 % IR SOLN
Status: DC | PRN
  Administered 2019-08-14: 1000 mL

## 2019-08-14 MED ORDER — BACITRACIN ZINC 500 UNIT/GM EX OINT
TOPICAL_OINTMENT | CUTANEOUS | Status: AC
  Filled 2019-08-14: qty 28.35

## 2019-08-14 MED ORDER — FENTANYL CITRATE (PF) 100 MCG/2ML IJ SOLN: 25.0000 ug | INTRAMUSCULAR | Status: DC | PRN

## 2019-08-14 MED ORDER — FENTANYL CITRATE (PF) 250 MCG/5ML IJ SOLN
INTRAMUSCULAR | Status: AC
  Filled 2019-08-14: qty 5

## 2019-08-14 MED ORDER — MEPERIDINE HCL 25 MG/ML IJ SOLN: 6.2500 mg | INTRAMUSCULAR | Status: DC | PRN

## 2019-08-14 MED ORDER — SUCCINYLCHOLINE CHLORIDE 200 MG/10ML IV SOSY
PREFILLED_SYRINGE | INTRAVENOUS | Status: DC | PRN
  Administered 2019-08-14: 100 mg via INTRAVENOUS

## 2019-08-14 MED ORDER — STERILE WATER FOR IRRIGATION IR SOLN
Status: DC | PRN
  Administered 2019-08-14: 1000 mL

## 2019-08-14 MED ORDER — LACTATED RINGERS IV SOLN: INTRAVENOUS | Status: DC | PRN

## 2019-08-14 MED ORDER — LIDOCAINE HCL (PF) 1 % IJ SOLN
INTRAMUSCULAR | Status: AC
  Filled 2019-08-14: qty 30

## 2019-08-14 MED ORDER — LIDOCAINE 20MG/ML (2%) 15 ML SYRINGE OPTIME
INTRAMUSCULAR | Status: DC | PRN
  Administered 2019-08-14: 80 mg via INTRAVENOUS

## 2019-08-14 MED ORDER — OXYMETAZOLINE HCL 0.05 % NA SOLN
NASAL | Status: AC
  Filled 2019-08-14: qty 30

## 2019-08-14 MED ORDER — ONDANSETRON HCL 4 MG/2ML IJ SOLN
INTRAMUSCULAR | Status: DC | PRN
  Administered 2019-08-14: 4 mg via INTRAVENOUS

## 2019-08-14 MED ORDER — LIDOCAINE 2% (20 MG/ML) 5 ML SYRINGE
INTRAMUSCULAR | Status: AC
  Filled 2019-08-14: qty 5

## 2019-08-14 MED ORDER — ONDANSETRON HCL 4 MG/2ML IJ SOLN: 4.0000 mg | Freq: Once | INTRAMUSCULAR | Status: DC | PRN

## 2019-08-14 MED ORDER — PROPOFOL 10 MG/ML IV BOLUS
INTRAVENOUS | Status: AC
  Filled 2019-08-14: qty 40

## 2019-08-14 MED ORDER — BACITRACIN ZINC 500 UNIT/GM EX OINT
TOPICAL_OINTMENT | CUTANEOUS | Status: DC | PRN
  Administered 2019-08-14: 1 via TOPICAL

## 2019-08-14 MED ORDER — DEXAMETHASONE SODIUM PHOSPHATE 10 MG/ML IJ SOLN
INTRAMUSCULAR | Status: AC
  Filled 2019-08-14: qty 1

## 2019-08-14 MED ORDER — PROPOFOL 10 MG/ML IV BOLUS
INTRAVENOUS | Status: DC | PRN
  Administered 2019-08-14: 20 mg via INTRAVENOUS
  Administered 2019-08-14: 120 mg via INTRAVENOUS

## 2019-08-14 MED ORDER — PHENYLEPHRINE HCL-NACL 10-0.9 MG/250ML-% IV SOLN
INTRAVENOUS | Status: DC | PRN
  Administered 2019-08-14: 10 ug/min via INTRAVENOUS

## 2019-08-14 MED ORDER — ONDANSETRON HCL 4 MG/2ML IJ SOLN
INTRAMUSCULAR | Status: AC
  Filled 2019-08-14: qty 2

## 2019-08-14 MED ORDER — SUGAMMADEX SODIUM 200 MG/2ML IV SOLN
INTRAVENOUS | Status: DC | PRN
  Administered 2019-08-14: 140 mg via INTRAVENOUS

## 2019-08-14 MED ORDER — ROCURONIUM BROMIDE 10 MG/ML (PF) SYRINGE
PREFILLED_SYRINGE | INTRAVENOUS | Status: AC
  Filled 2019-08-14: qty 10

## 2019-08-14 MED ORDER — MIDAZOLAM HCL 2 MG/2ML IJ SOLN
INTRAMUSCULAR | Status: AC
  Filled 2019-08-14: qty 2

## 2019-08-14 MED ORDER — EPHEDRINE 5 MG/ML INJ
INTRAVENOUS | Status: AC
  Filled 2019-08-14: qty 10

## 2019-08-14 MED ORDER — SUCCINYLCHOLINE CHLORIDE 200 MG/10ML IV SOSY
PREFILLED_SYRINGE | INTRAVENOUS | Status: AC
  Filled 2019-08-14: qty 10

## 2019-08-14 SURGICAL SUPPLY — 36 items
ATTRACTOMAT 16X20 MAGNETIC DRP (DRAPES) ×1 IMPLANT
BALLN PULM 15 16.5 18X75 (BALLOONS)
BALLOON PULM 15 16.5 18X75 (BALLOONS) IMPLANT
BLADE SURG 15 STRL LF DISP TIS (BLADE) IMPLANT
BLADE SURG 15 STRL SS (BLADE)
CANISTER SUCT 3000ML PPV (MISCELLANEOUS) ×2 IMPLANT
COAGULATOR SUCT 8FR VV (MISCELLANEOUS) ×1 IMPLANT
CONT SPEC 4OZ CLIKSEAL STRL BL (MISCELLANEOUS) ×2 IMPLANT
COVER BACK TABLE 60X90IN (DRAPES) ×2 IMPLANT
COVER MAYO STAND STRL (DRAPES) ×2 IMPLANT
COVER WAND RF STERILE (DRAPES) ×2 IMPLANT
DRAIN PENROSE 1/4X12 LTX STRL (WOUND CARE) ×1 IMPLANT
DRAPE HALF SHEET 40X57 (DRAPES) ×2 IMPLANT
GAUZE 4X4 16PLY RFD (DISPOSABLE) IMPLANT
GAUZE SPONGE 4X4 12PLY STRL (GAUZE/BANDAGES/DRESSINGS) ×2 IMPLANT
GLOVE SS BIOGEL STRL SZ 7.5 (GLOVE) ×1 IMPLANT
GLOVE SUPERSENSE BIOGEL SZ 7.5 (GLOVE) ×1
GUARD TEETH (MISCELLANEOUS) IMPLANT
KIT BASIN OR (CUSTOM PROCEDURE TRAY) ×2 IMPLANT
KIT TURNOVER KIT B (KITS) ×2 IMPLANT
NDL HYPO 25GX1X1/2 BEV (NEEDLE) IMPLANT
NEEDLE HYPO 25GX1X1/2 BEV (NEEDLE) IMPLANT
NS IRRIG 1000ML POUR BTL (IV SOLUTION) ×2 IMPLANT
PAD ARMBOARD 7.5X6 YLW CONV (MISCELLANEOUS) ×4 IMPLANT
PATTIES SURGICAL .5 X3 (DISPOSABLE) IMPLANT
SOL ANTI FOG 6CC (MISCELLANEOUS) IMPLANT
SOLUTION ANTI FOG 6CC (MISCELLANEOUS)
SPECIMEN JAR SMALL (MISCELLANEOUS) ×2 IMPLANT
SURGILUBE 2OZ TUBE FLIPTOP (MISCELLANEOUS) IMPLANT
SUT CHROMIC 3 0 SH 27 (SUTURE) ×1 IMPLANT
SUT ETHILON 5 0 P 3 18 (SUTURE) ×1
SUT NYLON ETHILON 5-0 P-3 1X18 (SUTURE) IMPLANT
SUT SILK 3 0 REEL (SUTURE) ×1 IMPLANT
TAPE CLOTH SURG 4X10 WHT LF (GAUZE/BANDAGES/DRESSINGS) ×1 IMPLANT
TOWEL GREEN STERILE FF (TOWEL DISPOSABLE) ×2 IMPLANT
TUBE CONNECTING 12X1/4 (SUCTIONS) ×2 IMPLANT

## 2019-08-14 NOTE — Discharge Instructions (Signed)
Keep dressing dry and clean Return to see Dr Ezzard Standing this Wednesday at 11:30 to have the dressing removed Start antibiotic Clindamycin 300 mg tid today Tylenol, ibuprofen, or hydrocodone 5 mg tab 1 every 6 hrs prn pain Use mouth lozenges for sore throat as needed.

## 2019-08-14 NOTE — Brief Op Note (Signed)
08/14/2019  10:42 AM  PATIENT:  [name].  49 y.o. male  PRE-OPERATIVE DIAGNOSIS:  LEFT NECK MASS  POST-OPERATIVE DIAGNOSIS:  LEFT NECK MASS  PROCEDURE:  Procedure(s): DIRECT LARYNGOSCOPY (Left) BIOPSY EXCISION OF LEFT NECK MASS (Left)  SURGEON:  Surgeon(s) and Role:    Drema Halon, MD - Primary  PHYSICIAN ASSISTANT:   ASSISTANTS: none   ANESTHESIA:   general  EBL:  50 mL   BLOOD ADMINISTERED:none  DRAINS: Penrose drain in the left neck   LOCAL MEDICATIONS USED:  XYLOCAINE with EPI  4 cc  SPECIMEN:  Source of Specimen:  left neck  DISPOSITION OF SPECIMEN:  PATHOLOGY  COUNTS:  YES  TOURNIQUET:  * No tourniquets in log *  DICTATION: .Other Dictation: Dictation Number [id number]  PLAN OF CARE: Discharge to home after PACU  PATIENT DISPOSITION:  PACU - hemodynamically stable.   Delay start of Pharmacological VTE agent (>24hrs) due to surgical blood loss or risk of bleeding: yes

## 2019-08-14 NOTE — H&P (Signed)
PREOPERATIVE H&P  Chief Complaint: Left neck mass   HPI: Paul [name]. is a 49 y.o. male who presents for evaluation of direct left neck mass.  He had a CT performed that was suspicious for metastatic left neck lymphadenopathy. FNA of the neck mass was nondiagnostic.  Patient is taken to the operating room at this time for direct laryngoscopy with and biopsy and possible excision of left neck mass for diagnostic purposes.  Of note on exam in the office and review the CT scan there was no obvious upper airway pathology noted.  Past Medical History:  Diagnosis Date  . Anxiety   . Bipolar disorder ([place])   . CAD (coronary artery disease)    Anterior MI 2007 with DES stenting of an occluded LAD, distal 70% stenosis, OM 60% stenosis, circumflex 90% stenosis treated with a drug-eluting stent, PA 90% stenosis treated medically.  Cath January 2013 70-80% diagonal stenosis in a small vessel, OM 50% stenosis, PDA long 75% stenosis. EF 45%. He was managed medically.  . Cardiomyopathy, ischemic    EF 45%  . Diabetes mellitus   . History of chest pain 11/25/2010  . Hyperlipidemia   . Hypertension   . Myocardial infarction ([place])   . PTSD (post-traumatic stress disorder)   . Tuberculosis    1989, took pills for it    Past Surgical History:  Procedure Laterality Date  . Catherization  08/25/11   Dr. Percival Spanish  . CORONARY ANGIOPLASTY WITH STENT PLACEMENT  207, 2011   4 stents placed  . LEFT HEART CATHETERIZATION WITH CORONARY ANGIOGRAM N/A 08/25/2011   Procedure: LEFT HEART CATHETERIZATION WITH CORONARY ANGIOGRAM;  Surgeon: Minus Breeding, MD;  Location: [facility] CATH LAB;  Service: Cardiovascular;  Laterality: N/A;   Social History   Socioeconomic History  . Marital status: Single    Spouse name: Not on file  . Number of children: Not on file  . Years of education: Not on file  . Highest education level: Not on file  Occupational History  . Not on file  Tobacco Use  . Smoking status: Never  Smoker  . Smokeless tobacco: Never Used  Substance and Sexual Activity  . Alcohol use: No  . Drug use: No  . Sexual activity: Not on file  Other Topics Concern  . Not on file  Social History Narrative  . Not on file   Social Determinants of Health   Financial Resource Strain:   . Difficulty of Paying Living Expenses: Not on file  Food Insecurity:   . Worried About Charity fundraiser in the Last Year: Not on file  . Ran Out of Food in the Last Year: Not on file  Transportation Needs:   . Lack of Transportation (Medical): Not on file  . Lack of Transportation (Non-Medical): Not on file  Physical Activity:   . Days of Exercise per Week: Not on file  . Minutes of Exercise per Session: Not on file  Stress:   . Feeling of Stress : Not on file  Social Connections:   . Frequency of Communication with Friends and Family: Not on file  . Frequency of Social Gatherings with Friends and Family: Not on file  . Attends Religious Services: Not on file  . Active Member of Clubs or Organizations: Not on file  . Attends Archivist Meetings: Not on file  . Marital Status: Not on file   Family History  Problem Relation Age of Onset  . Hypertension Mother   .  Hypertension Father   . Diabetes Maternal Aunt   . Diabetes Maternal Uncle   . Cancer Maternal Grandmother    Allergies  Allergen Reactions  . Shrimp [Shellfish Allergy] Swelling    tongue   Prior to Admission medications   Medication Sig Start Date End Date Taking? Authorizing Provider  aspirin 81 MG EC tablet Take 1 tablet (81 mg total) by mouth daily. 09/30/17  Yes Mikell, Jeani Sow, MD  carvedilol (COREG) 25 MG tablet Take 1 tablet (25 mg total) by mouth 2 (two) times daily with a meal. 10/04/18  Yes Lockamy, Timothy, DO  citalopram (CELEXA) 20 MG tablet Take 1 tablet (20 mg total) by mouth 2 (two) times daily. 09/19/18  Yes Nuala Alpha, DO  clopidogrel (PLAVIX) 75 MG tablet Take 1 tablet (75 mg total) by mouth  daily. 12/20/18  Yes Lockamy, Timothy, DO  empagliflozin (JARDIANCE) 25 MG TABS tablet Take 25 mg by mouth daily. 07/25/19  Yes Bonnita Hollow, MD  insulin glargine (LANTUS) 100 unit/mL SOPN Take 25 units daily Patient taking differently: Inject 25 Units into the skin every morning.  03/13/19  Yes Lockamy, Timothy, DO  isosorbide mononitrate (IMDUR) 30 MG 24 hr tablet Take 1 tablet (30 mg total) by mouth daily. 03/21/19  Yes Lockamy, Timothy, DO  metFORMIN (GLUCOPHAGE) 1000 MG tablet TAKE 1 TABLET BY MOUTH TWICE DAILY WITH A MEAL Patient taking differently: Take 1,000 mg by mouth 2 (two) times daily with a meal.  09/19/18  Yes Lockamy, Timothy, DO  Multiple Vitamin (MULTIVITAMIN WITH MINERALS) TABS tablet Take 1 tablet by mouth daily.   Yes [provider]  nitroGLYCERIN (NITROSTAT) 0.4 MG SL tablet Place 1 tablet (0.4 mg total) under the tongue every 5 (five) minutes as needed. Patient taking differently: Place 0.4 mg under the tongue every 5 (five) minutes as needed for chest pain.  10/04/18  Yes Lockamy, Timothy, DO  pravastatin (PRAVACHOL) 40 MG tablet Take 1 tablet (40 mg total) by mouth every evening. 09/19/18 09/14/19 Yes Lockamy, Timothy, DO  quinapril-hydrochlorothiazide (ACCURETIC) 20-25 MG tablet Take 1 tablet by mouth daily. 06/13/19  Yes [provider]  triamcinolone cream (KENALOG) 0.1 % Apply 1 application topically 2 (two) times daily. 08/01/19  Yes Bland, Scott, DO  ARIPiprazole (ABILIFY) 5 MG tablet Take 1 tablet (5 mg total) by mouth daily. Patient not taking: Reported on 08/08/2019 11/29/18   Guadalupe Dawn, MD  Blood Glucose Monitoring Suppl (ONE TOUCH ULTRA 2) W/DEVICE KIT Check sugar once daily. ICD10 E11.65 01/24/15   Leone Brand, MD  Continuous Glucose Monitor KIT 1 kit by Does not apply route daily. 02/20/19   Caroline More, DO  gabapentin (NEURONTIN) 100 MG capsule Take 1 capsule (100 mg total) by mouth 3 (three) times daily. Patient not taking: Reported  on 08/08/2019 01/10/18   Tonette Bihari, MD  glucose blood (ONE TOUCH ULTRA TEST) test strip Check sugar once daily in the morning. ICD10 E11.65 01/10/18   Mikell, Jeani Sow, MD  liraglutide (VICTOZA) 18 MG/3ML SOPN Inject 0.2 mLs (1.2 mg total) into the skin daily. Patient not taking: Reported on 08/08/2019 03/21/19   Nuala Alpha, DO  ONE TOUCH LANCETS MISC Use to check sugar once daily. ICD10 E11.65 01/09/15   Willeen Niece, MD  traZODone (DESYREL) 50 MG tablet Take 1 tablet (50 mg total) by mouth at bedtime as needed for sleep. Patient not taking: Reported on 08/08/2019 11/29/18   Guadalupe Dawn, MD     Positive ROS:  Denies sore throat.  No hoarseness.  All other systems have been reviewed and were otherwise negative with the exception of those mentioned in the HPI and as above.  Physical Exam: Vitals:   08/14/19 0700 08/14/19 0716  BP: (!) 191/15   Temp:  98.6 F (37 C)  SpO2: 92%     General: Alert, no acute distress Oral: Normal oral mucosa and tonsils.  The left tonsil slightly indurated to palpation. Nasal: Clear nasal passages Neck: Approximate 3 cm firm left neck mass in the region of the submandibular gland and upper jugular nodes right. Ear: Ear canal is clear with normal appearing TMs Cardiovascular: Regular rate and rhythm, no murmur.  Respiratory: Clear to auscultation Neurologic: Alert and oriented x 3   Assessment/Plan: LEFT NECK MASS Plan for Procedure(s): DIRECT LARYNGOSCOPY BIOPSY EXCISION OF LEFT NECK MASS   Melony Overly, MD 08/14/2019 7:34 AM

## 2019-08-14 NOTE — Interval H&P Note (Signed)
History and Physical Interval Note:  08/14/2019 7:42 AM  Paul [name].  has presented today for surgery, with the diagnosis of LEFT NECK MASS.  The various methods of treatment have been discussed with the patient and family. After consideration of risks, benefits and other options for treatment, the patient has consented to  Procedure(s): DIRECT LARYNGOSCOPY (Left) BIOPSY EXCISION OF LEFT NECK MASS (Left) as a surgical intervention.  The patient's history has been reviewed, patient examined, no change in status, stable for surgery.  I have reviewed the patient's chart and labs.  Questions were answered to the patient's satisfaction.     [name]

## 2019-08-14 NOTE — Progress Notes (Signed)
Elevated BP. Dr. Malen Gauze in room, stated okay for patient to take at home BP meds this morning prior to surgery.

## 2019-08-14 NOTE — Anesthesia Postprocedure Evaluation (Signed)
Anesthesia Post Note  Patient: Paul [name].  Procedure(s) Performed: DIRECT LARYNGOSCOPY (Left ) BIOPSY EXCISION OF LEFT NECK MASS (Left )     Patient location during evaluation: PACU Anesthesia Type: General Level of consciousness: awake and alert and oriented Pain management: pain level controlled Vital Signs Assessment: post-procedure vital signs reviewed and stable Respiratory status: spontaneous breathing, nonlabored ventilation and respiratory function stable Cardiovascular status: blood pressure returned to baseline and stable Postop Assessment: no apparent nausea or vomiting Anesthetic complications: no    Last Vitals:  Vitals:   08/14/19 1120 08/14/19 1135  BP: 125/84 125/86  Pulse: 79 79  Resp: (!) 3 18  Temp:    SpO2: 94% 96%    Last Pain:  Vitals:   08/14/19 1120  PainSc: 0-No pain                 Darnita Woodrum A.

## 2019-08-14 NOTE — Op Note (Signed)
NAME: Paul [name], Paul [name] MEDICAL RECORD WI:[id number] ACCOUNT [id number] DATE OF BIRTH:1971/01/21 FACILITY: [facility] LOCATION: [facility] PHYSICIAN:Mykia Holton Braxton Feathers, MD  OPERATIVE REPORT  DATE OF PROCEDURE:  08/14/2019  PREOPERATIVE DIAGNOSIS:  Left neck lymphadenopathy consistent with possible metastatic carcinoma.  POSTOPERATIVE DIAGNOSIS:  Left neck lymphadenopathy consistent with possible metastatic carcinoma, findings were consistent with chronic infection.  OPERATION PERFORMED:   1.  Direct laryngoscopy and biopsy.   2.  Incisional biopsy of left neck mass.  SURGEON:  [name], MD  ANESTHESIA:  General endotracheal.  COMPLICATIONS:  None.  BRIEF CLINICAL NOTE:  The patient is a 49 year old gentleman who has had a longstanding left neck mass for several months now.  He has been treated with antibiotics.  CT scan was suspicious for possible metastatic carcinoma in left submandibular nodes  versus abscess.  Fine needle aspirate was nondiagnostic.  The patient was taken to the operating room at this time for direct laryngoscopy, biopsy and excisional biopsy of left neck mass.  DESCRIPTION OF PROCEDURE:  After undergoing general endotracheal, first direct laryngoscopy was performed.  On direct laryngoscopy, no gross abnormalities were noted on the left base of tongue, left vallecula, left AE folds, piriform sinus or vocal  cords.  A slightly large left tonsil compared to right, but no gross ulceration, no obvious cancer noted.  A biopsy was obtained from the base of tongue as well as the left tonsil.  This was sent for frozen section and both of these biopsies were  negative for any cancer.  Next, the left neck was prepped with Betadine solution and draped with sterile towels.  A horizontal incision was made just below the mass on the left side of the neck.  On elevation of this, there were very poor planes and this  was all consistent clinically with chronic scar tissue  and inflammation.  A portion of the thickened tissue was sent for frozen section and this again revealed no obvious cancer.  The abscess was opened up and drained and cultures were obtained.  The  area of inflammation was really separate from the submandibular gland, which appeared fairly normal.  A portion of the gland had thickened tissue adjacent to the gland.  It was also sent for frozen section.  This also was negative.  After opening up the  abscess and washing this area out with saline, a quarter-inch Penrose drain was placed in this area.  Hemostasis was obtained with cautery.  The ____ vein had to be ligated and tied with 2-0 silk sutures.  After adequate hemostasis, the wound was closed  with 2-0 chromic suture subcutaneously and 5-0 nylon to reapproximate the skin edges and secured with a quarter-inch Penrose drain, which was placed within the abscess.  The patient was awoken from anesthesia and transferred to the recovery room  postoperatively doing well.  DISPOSITION:  The patient is discharged home later this morning on clindamycin 300 mg t.i.d. for 1 week, along with Tylenol, ibuprofen and/or hydrocodone p.r.n. pain.  He will follow up in my office in 2 days for a recheck and have the Penrose drain  removed.  VN/NUANCE  D:08/14/2019 T:08/14/2019 JOB:[id number]

## 2019-08-14 NOTE — Anesthesia Procedure Notes (Signed)
Procedure Name: Intubation Date/Time: 08/14/2019 7:45 AM Performed by: Lowella Dell, CRNA Pre-anesthesia Checklist: Patient identified, Emergency Drugs available, Suction available and Patient being monitored Patient Re-evaluated:Patient Re-evaluated prior to induction Oxygen Delivery Method: Circle System Utilized Preoxygenation: Pre-oxygenation with 100% oxygen Induction Type: IV induction and Rapid sequence Laryngoscope Size: Mac and 4 Grade View: Grade I Tube type: Oral Tube size: 6.5 mm Number of attempts: 1 Airway Equipment and Method: Stylet Placement Confirmation: ETT inserted through vocal cords under direct vision,  positive ETCO2 and breath sounds checked- equal and bilateral Secured at: 22 cm Tube secured with: Tape Dental Injury: Teeth and Oropharynx as per pre-operative assessment

## 2019-08-14 NOTE — Transfer of Care (Signed)
Immediate Anesthesia Transfer of Care Note  Patient: Paul [name].  Procedure(s) Performed: DIRECT LARYNGOSCOPY (Left ) BIOPSY EXCISION OF LEFT NECK MASS (Left )  Patient Location: PACU  Anesthesia Type:General  Level of Consciousness: drowsy and patient cooperative  Airway & Oxygen Therapy: Patient connected to face mask  Post-op Assessment: Report given to RN and Post -op Vital signs reviewed and stable  Post vital signs: Reviewed and stable  Last Vitals:  Vitals Value Taken Time  BP 139/97 08/14/19 1050  Temp    Pulse 97 08/14/19 1051  Resp 12 08/14/19 1051  SpO2 100 % 08/14/19 1051  Vitals shown include unvalidated device data.  Last Pain:  Vitals:   08/14/19 0700  PainSc: 0-No pain         Complications: No apparent anesthesia complications

## 2019-08-16 LAB — SURGICAL PATHOLOGY

## 2019-08-17 ENCOUNTER — Ambulatory Visit (INDEPENDENT_AMBULATORY_CARE_PROVIDER_SITE_OTHER): Payer: Medicare Other | Admitting: Otolaryngology

## 2019-08-17 DIAGNOSIS — Z4889 Encounter for other specified surgical aftercare: Secondary | ICD-10-CM

## 2019-08-18 ENCOUNTER — Other Ambulatory Visit: Payer: Self-pay

## 2019-08-18 NOTE — Progress Notes (Signed)
HPI: Paul [name]. is a 49 y.o. male who presents 3 days s/p drainage of left neck mass.  Presents today to have the Penrose drain removed..   Past Medical History:  Diagnosis Date  . Anxiety   . Bipolar disorder ([place])   . CAD (coronary artery disease)    Anterior MI 2007 with DES stenting of an occluded LAD, distal 70% stenosis, OM 60% stenosis, circumflex 90% stenosis treated with a drug-eluting stent, PA 90% stenosis treated medically.  Cath January 2013 70-80% diagonal stenosis in a small vessel, OM 50% stenosis, PDA long 75% stenosis. EF 45%. He was managed medically.  . Cardiomyopathy, ischemic    EF 45%  . Diabetes mellitus   . History of chest pain 11/25/2010  . Hyperlipidemia   . Hypertension   . Myocardial infarction ([place])   . PTSD (post-traumatic stress disorder)   . Tuberculosis    1989, took pills for it    Past Surgical History:  Procedure Laterality Date  . Catherization  08/25/11   Dr. Percival Spanish  . CORONARY ANGIOPLASTY WITH STENT PLACEMENT  207, 2011   4 stents placed  . DIRECT LARYNGOSCOPY Left 08/14/2019   Procedure: DIRECT LARYNGOSCOPY;  Surgeon: Rozetta Nunnery, MD;  Location: [place];  Service: ENT;  Laterality: Left;  . LEFT HEART CATHETERIZATION WITH CORONARY ANGIOGRAM N/A 08/25/2011   Procedure: LEFT HEART CATHETERIZATION WITH CORONARY ANGIOGRAM;  Surgeon: Minus Breeding, MD;  Location[facility] C[facility] LAB;  Service: Cardiovascular;  Laterality: N/A;  . MASS BIOPSY Left 08/14/2019   Procedure: BIOPSY EXCISION OF LEFT NECK MASS;  Surgeon: Rozetta Nunnery, MD;  Location: [facility] OR;  Service: ENT;  Laterality: Left;   Social History   Socioeconomic History  . Marital status: Single    Spouse name: Not on file  . Number of children: Not on file  . Years of education: Not on file  . Highest education level: Not on file  Occupational History  . Not on file  Tobacco Use  . Smoking status: Never Smoker  . Smokeless tobacco: Never Used  Substance and Sexual  Activity  . Alcohol use: No  . Drug use: No  . Sexual activity: Not on file  Other Topics Concern  . Not on file  Social History Narrative  . Not on file   Social Determinants of Health   Financial Resource Strain:   . Difficulty of Paying Living Expenses: Not on file  Food Insecurity:   . Worried About Charity fundraiser in the Last Year: Not on file  . Ran Out of Food in the Last Year: Not on file  Transportation Needs:   . Lack of Transportation (Medical): Not on file  . Lack of Transportation (Non-Medical): Not on file  Physical Activity:   . Days of Exercise per Week: Not on file  . Minutes of Exercise per Session: Not on file  Stress:   . Feeling of Stress : Not on file  Social Connections:   . Frequency of Communication with Friends and Family: Not on file  . Frequency of Social Gatherings with Friends and Family: Not on file  . Attends Religious Services: Not on file  . Active Member of Clubs or Organizations: Not on file  . Attends Archivist Meetings: Not on file  . Marital Status: Not on file   Family History  Problem Relation Age of Onset  . Hypertension Mother   . Hypertension Father   . Diabetes Maternal Aunt   .  Diabetes Maternal Uncle   . Cancer Maternal Grandmother    Allergies  Allergen Reactions  . Shrimp [Shellfish Allergy] Swelling    tongue   Prior to Admission medications   Medication Sig Start Date End Date Taking? Authorizing Provider  ARIPiprazole (ABILIFY) 5 MG tablet Take 1 tablet (5 mg total) by mouth daily. Patient not taking: Reported on 08/08/2019 11/29/18   Guadalupe Dawn, MD  aspirin 81 MG EC tablet Take 1 tablet (81 mg total) by mouth daily. 09/30/17   Mikell, Jeani Sow, MD  Blood Glucose Monitoring Suppl (ONE TOUCH ULTRA 2) W/DEVICE KIT Check sugar once daily. ICD10 E11.65 01/24/15   Leone Brand, MD  carvedilol (COREG) 25 MG tablet Take 1 tablet (25 mg total) by mouth 2 (two) times daily with a meal. 10/04/18    Lockamy, Timothy, DO  citalopram (CELEXA) 20 MG tablet Take 1 tablet (20 mg total) by mouth 2 (two) times daily. 09/19/18   Nuala Alpha, DO  clindamycin (CLEOCIN) 300 MG capsule Take 1 capsule (300 mg total) by mouth 3 (three) times daily for 10 days. 08/14/19 08/24/19  Rozetta Nunnery, MD  Continuous Glucose Monitor KIT 1 kit by Does not apply route daily. 02/20/19   Caroline More, DO  empagliflozin (JARDIANCE) 25 MG TABS tablet Take 25 mg by mouth daily. 07/25/19   Bonnita Hollow, MD  gabapentin (NEURONTIN) 100 MG capsule Take 1 capsule (100 mg total) by mouth 3 (three) times daily. Patient not taking: Reported on 08/08/2019 01/10/18   Tonette Bihari, MD  glucose blood (ONE TOUCH ULTRA TEST) test strip Check sugar once daily in the morning. ICD10 E11.65 01/10/18   Tonette Bihari, MD  HYDROcodone-acetaminophen (NORCO/VICODIN) 5-325 MG tablet Take 1-2 tablets by mouth every 6 (six) hours as needed for moderate pain. 08/14/19 08/13/20  Rozetta Nunnery, MD  insulin glargine (LANTUS) 100 unit/mL SOPN Take 25 units daily Patient taking differently: Inject 25 Units into the skin every morning.  03/13/19   Nuala Alpha, DO  isosorbide mononitrate (IMDUR) 30 MG 24 hr tablet Take 1 tablet (30 mg total) by mouth daily. 03/21/19   Lockamy, Christia [place], DO  liraglutide (VICTOZA) 18 MG/3ML SOPN Inject 0.2 mLs (1.2 mg total) into the skin daily. Patient not taking: Reported on 08/08/2019 03/21/19   Nuala Alpha, DO  metFORMIN (GLUCOPHAGE) 1000 MG tablet TAKE 1 TABLET BY MOUTH TWICE DAILY WITH A MEAL Patient taking differently: Take 1,000 mg by mouth 2 (two) times daily with a meal.  09/19/18   Nuala Alpha, DO  Multiple Vitamin (MULTIVITAMIN WITH MINERALS) TABS tablet Take 1 tablet by mouth daily.    [provider]  nitroGLYCERIN (NITROSTAT) 0.4 MG SL tablet Place 1 tablet (0.4 mg total) under the tongue every 5 (five) minutes as needed. Patient taking differently: Place 0.4 mg  under the tongue every 5 (five) minutes as needed for chest pain.  10/04/18   Nuala Alpha, DO  ONE TOUCH LANCETS MISC Use to check sugar once daily. ICD10 E11.65 01/09/15   Willeen Niece, MD  pravastatin (PRAVACHOL) 40 MG tablet Take 1 tablet (40 mg total) by mouth every evening. 09/19/18 09/14/19  Nuala Alpha, DO  quinapril-hydrochlorothiazide (ACCURETIC) 20-25 MG tablet Take 1 tablet by mouth daily. 06/13/19   [provider]  traZODone (DESYREL) 50 MG tablet Take 1 tablet (50 mg total) by mouth at bedtime as needed for sleep. Patient not taking: Reported on 08/08/2019 11/29/18   Guadalupe Dawn, MD  triamcinolone cream (  KENALOG) 0.1 % Apply 1 application topically 2 (two) times daily. 08/01/19   Sherene Sires, DO     Physical Exam: Neck incision is healing nicely.  The Penrose drain was removed without difficulty and the wound redressed.   Assessment: S/p excision of left neck mass.  Plan: Reviewed pathology with him which was benign and consistent more with chronic inflammation. He will continue with antibiotics and follow-up next week to have sutures removed.   Radene Journey, MD

## 2019-08-22 LAB — AEROBIC/ANAEROBIC CULTURE W GRAM STAIN (SURGICAL/DEEP WOUND)

## 2019-08-23 ENCOUNTER — Other Ambulatory Visit: Payer: Self-pay

## 2019-08-23 ENCOUNTER — Encounter (HOSPITAL_COMMUNITY): Payer: Self-pay | Admitting: *Deleted

## 2019-08-23 ENCOUNTER — Emergency Department (HOSPITAL_COMMUNITY)
Admission: EM | Admit: 2019-08-23 | Discharge: 2019-08-23 | Disposition: A | Discharge status: Home / Self Care | Payer: Medicare Other | Attending: Emergency Medicine | Admitting: Emergency Medicine

## 2019-08-23 DIAGNOSIS — Z7984 Long term (current) use of oral hypoglycemic drugs: Secondary | ICD-10-CM | POA: Diagnosis not present

## 2019-08-23 DIAGNOSIS — R221 Localized swelling, mass and lump, neck: Secondary | ICD-10-CM | POA: Insufficient documentation

## 2019-08-23 DIAGNOSIS — Z7982 Long term (current) use of aspirin: Secondary | ICD-10-CM | POA: Insufficient documentation

## 2019-08-23 DIAGNOSIS — Z79899 Other long term (current) drug therapy: Secondary | ICD-10-CM | POA: Diagnosis not present

## 2019-08-23 DIAGNOSIS — S1191XD Laceration without foreign body of unspecified part of neck, subsequent encounter: Secondary | ICD-10-CM | POA: Diagnosis not present

## 2019-08-23 DIAGNOSIS — E1140 Type 2 diabetes mellitus with diabetic neuropathy, unspecified: Secondary | ICD-10-CM | POA: Diagnosis not present

## 2019-08-23 DIAGNOSIS — I10 Essential (primary) hypertension: Secondary | ICD-10-CM | POA: Diagnosis not present

## 2019-08-23 DIAGNOSIS — Z4802 Encounter for removal of sutures: Secondary | ICD-10-CM | POA: Insufficient documentation

## 2019-08-23 NOTE — Discharge Instructions (Addendum)
Please keep the wound clean and dry.  Continue the antibiotics you are taking at home.  Follow-up with Dr. Allene Pyo office next week on 08/30/2019 in his office between the times of 11 AM and 12 PM for reassessment.  Please return to the emergency department for any new or worsening symptoms in the meantime.

## 2019-08-23 NOTE — ED Provider Notes (Signed)
[facility] EMERGENCY DEPARTMENT Provider Note   CSN: [id number] Arrival date & time: 08/23/19  1126     History Chief Complaint  Patient presents with  . Suture / Staple Removal    Winson Eichorn. is a 49 y.o. male.  HPI   Patient is a 49 year old male with history of anxiety, bipolar disorder, CAD, cardiomyopathy, diabetes, hyperlipidemia, hypertension, MI, PTSD, tuberculosis, who presents the emergency department today for suture removal.  Patient had biopsy of a left neck mass earlier this month with Dr. Lucia Gaskins, ENT.  He had a Penrose drain in the wound following this which was removed last week.  He states he was supposed to get his sutures out 2 days ago however he did not go to his appointment and he states that Dr. Lucia Gaskins was not in the office.  He reports minimal drainage from the wound.  No increased swelling or warmth.  Denies any tenderness and states that the area is still numb from surgery.  He has had no fevers.  Past Medical History:  Diagnosis Date  . Anxiety   . Bipolar disorder ([place])   . CAD (coronary artery disease)    Anterior MI 2007 with DES stenting of an occluded LAD, distal 70% stenosis, OM 60% stenosis, circumflex 90% stenosis treated with a drug-eluting stent, PA 90% stenosis treated medically.  Cath January 2013 70-80% diagonal stenosis in a small vessel, OM 50% stenosis, PDA long 75% stenosis. EF 45%. He was managed medically.  . Cardiomyopathy, ischemic    EF 45%  . Diabetes mellitus   . History of chest pain 11/25/2010  . Hyperlipidemia   . Hypertension   . Myocardial infarction ([place])   . PTSD (post-traumatic stress disorder)   . Tuberculosis    1989, took pills for it     Patient Active Problem List   Diagnosis Date Noted  . Neck mass 07/14/2019  . Hypotension 02/20/2019  . PTSD (post-traumatic stress disorder) 12/01/2018  . Cardiomyopathy ([place]) 01/14/2018  . Diabetic neuropathy ([place]) 01/10/2018  . Nausea alone  02/28/2013  . Cold sensation of skin 07/29/2012  . Hypertension   . CAD (coronary artery disease)   . Hyperlipidemia LDL goal <100   . Stented coronary artery 04/30/2011  . Atopic dermatitis 08/19/2010  . Anxiety state 09/27/2009  . Diabetes mellitus type 2, uncontrolled ([place]) 09/19/2009    Past Surgical History:  Procedure Laterality Date  . Catherization  08/25/11   Dr. Percival Spanish  . CORONARY ANGIOPLASTY WITH STENT PLACEMENT  207, 2011   4 stents placed  . DIRECT LARYNGOSCOPY Left 08/14/2019   Procedure: DIRECT LARYNGOSCOPY;  Surgeon: Rozetta Nunnery, MD;  Location: [facility];  Service: ENT;  Laterality: Left;  . LEFT HEART CATHETERIZATION WITH CORONARY ANGIOGRAM N/A 08/25/2011   Procedure: LEFT HEART CATHETERIZATION WITH CORONARY ANGIOGRAM;  Surgeon: Minus Breeding, MD;  Location: [facility] CATH LAB;  Service: Cardiovascular;  Laterality: N/A;  . MASS BIOPSY Left 08/14/2019   Procedure: BIOPSY EXCISION OF LEFT NECK MASS;  Surgeon: Rozetta Nunnery, MD;  Location: [facility] OR;  Service: ENT;  Laterality: Left;       Family History  Problem Relation Age of Onset  . Hypertension Mother   . Hypertension Father   . Diabetes Maternal Aunt   . Diabetes Maternal Uncle   . Cancer Maternal Grandmother     Social History   Tobacco Use  . Smoking status: Never Smoker  . Smokeless tobacco: Never Used  Substance Use Topics  .  Alcohol use: No  . Drug use: No    Home Medications Prior to Admission medications   Medication Sig Start Date End Date Taking? Authorizing Provider  ARIPiprazole (ABILIFY) 5 MG tablet Take 1 tablet (5 mg total) by mouth daily. Patient not taking: Reported on 08/08/2019 11/29/18   Guadalupe Dawn, MD  aspirin 81 MG EC tablet Take 1 tablet (81 mg total) by mouth daily. 09/30/17   Mikell, Jeani Sow, MD  Blood Glucose Monitoring Suppl (ONE TOUCH ULTRA 2) W/DEVICE KIT Check sugar once daily. ICD10 E11.65 01/24/15   Leone Brand, MD  carvedilol (COREG) 25 MG tablet  Take 1 tablet (25 mg total) by mouth 2 (two) times daily with a meal. 10/04/18   Lockamy, Timothy, DO  citalopram (CELEXA) 20 MG tablet Take 1 tablet (20 mg total) by mouth 2 (two) times daily. 09/19/18   Nuala Alpha, DO  clindamycin (CLEOCIN) 300 MG capsule Take 1 capsule (300 mg total) by mouth 3 (three) times daily for 10 days. 08/14/19 08/24/19  Rozetta Nunnery, MD  Continuous Glucose Monitor KIT 1 kit by Does not apply route daily. 02/20/19   Caroline More, DO  empagliflozin (JARDIANCE) 25 MG TABS tablet Take 25 mg by mouth daily. 07/25/19   Bonnita Hollow, MD  gabapentin (NEURONTIN) 100 MG capsule Take 1 capsule (100 mg total) by mouth 3 (three) times daily. Patient not taking: Reported on 08/08/2019 01/10/18   Tonette Bihari, MD  glucose blood (ONE TOUCH ULTRA TEST) test strip Check sugar once daily in the morning. ICD10 E11.65 01/10/18   Tonette Bihari, MD  HYDROcodone-acetaminophen (NORCO/VICODIN) 5-325 MG tablet Take 1-2 tablets by mouth every 6 (six) hours as needed for moderate pain. 08/14/19 08/13/20  Rozetta Nunnery, MD  insulin glargine (LANTUS) 100 unit/mL SOPN Take 25 units daily Patient taking differently: Inject 25 Units into the skin every morning.  03/13/19   Nuala Alpha, DO  isosorbide mononitrate (IMDUR) 30 MG 24 hr tablet Take 1 tablet (30 mg total) by mouth daily. 03/21/19   Lockamy, Christia Reading, DO  liraglutide (VICTOZA) 18 MG/3ML SOPN Inject 0.2 mLs (1.2 mg total) into the skin daily. Patient not taking: Reported on 08/08/2019 03/21/19   Nuala Alpha, DO  metFORMIN (GLUCOPHAGE) 1000 MG tablet TAKE 1 TABLET BY MOUTH TWICE DAILY WITH A MEAL Patient taking differently: Take 1,000 mg by mouth 2 (two) times daily with a meal.  09/19/18   Nuala Alpha, DO  Multiple Vitamin (MULTIVITAMIN WITH MINERALS) TABS tablet Take 1 tablet by mouth daily.    [provider]  nitroGLYCERIN (NITROSTAT) 0.4 MG SL tablet Place 1 tablet (0.4 mg total) under the  tongue every 5 (five) minutes as needed. Patient taking differently: Place 0.4 mg under the tongue every 5 (five) minutes as needed for chest pain.  10/04/18   Nuala Alpha, DO  ONE TOUCH LANCETS MISC Use to check sugar once daily. ICD10 E11.65 01/09/15   Willeen Niece, MD  pravastatin (PRAVACHOL) 40 MG tablet Take 1 tablet (40 mg total) by mouth every evening. 09/19/18 09/14/19  Nuala Alpha, DO  quinapril-hydrochlorothiazide (ACCURETIC) 20-25 MG tablet Take 1 tablet by mouth daily. 06/13/19   [provider]  traZODone (DESYREL) 50 MG tablet Take 1 tablet (50 mg total) by mouth at bedtime as needed for sleep. Patient not taking: Reported on 08/08/2019 11/29/18   Guadalupe Dawn, MD  triamcinolone cream (KENALOG) 0.1 % Apply 1 application topically 2 (two) times daily. 08/01/19   Criss Rosales,  Scott, DO    Allergies    Shrimp [shellfish allergy]  Review of Systems   Review of Systems  Constitutional: Negative for fever.  HENT: Negative for sore throat.   Eyes: Negative for visual disturbance.  Cardiovascular: Negative for chest pain.  Gastrointestinal: Negative for abdominal pain.  Skin: Positive for wound. Negative for color change.  All other systems reviewed and are negative.   Physical Exam Updated Vital Signs BP (!) 120/101 (BP Location: Right Arm)   Pulse (!) 106   Temp 98.2 F (36.8 C) (Oral)   Resp 16   Ht _0  (1.803 m)   Wt 69.6 kg   SpO2 98%   BMI 21.40 kg/m   Physical Exam Constitutional:      General: He is not in acute distress.    Appearance: He is well-developed.  Eyes:     Conjunctiva/sclera: Conjunctivae normal.  Neck:     Comments: Sutures noted to the left side of the neck with some surrounding swelling noted. There is no erythema, warmth, or tenderness. There is some fluctuance noted around the wound. Scant drainage noted.  Cardiovascular:     Rate and Rhythm: Normal rate.  Pulmonary:     Effort: Pulmonary effort is normal.  Skin:     General: Skin is warm and dry.  Neurological:     Mental Status: He is alert and oriented to person, place, and time.     ED Results / Procedures / Treatments   Labs (all labs ordered are listed, but only abnormal results are displayed) Labs Reviewed - No data to display  EKG None  Radiology No results found.  Procedures .Suture Removal  Date/Time: 08/23/2019 12:58 PM Performed by: Rodney Booze, PA-C Authorized by: Rodney Booze, PA-C   Consent:    Consent obtained:  Verbal   Consent given by:  Patient   Risks discussed:  Bleeding, pain and wound separation   Alternatives discussed:  Delayed treatment and no treatment Location:    Location:  Head/neck   Head/neck location:  Neck Procedure details:    Objective wound description: some fluctuance noted with scant drainage, no erythema, warmth, ttp, or indruation. Post-procedure details:    Post-removal:  Dressing applied   Patient tolerance of procedure:  Tolerated well, no immediate complications   (including critical care time)  Medications Ordered in ED Medications - No data to display  ED Course  I have reviewed the triage vital signs and the nursing notes.  Pertinent labs & imaging results that were available during my care of the patient were reviewed by me and considered in my medical decision making (see chart for details).    MDM Rules/Calculators/A&P                      Patient is a 49 year old male presenting for suture removal after he had a biopsy of a left neck mass last week with Dr. Lucia Gaskins. Was supposed to f/u this week for suture removal but was unable to be seen this week.  Patient denies any fevers.  He states he has had some drainage from the wound.  The wound is well-healing and does not appear to be acutely infected though there is some fluctuance on exam.  I did discuss patient case with Dr. Lucia Gaskins with ENT who recommends removal of the sutures.  He is not concerned about the area of  fluctuance.  He recommended the patient follow-up in the office next week.  Sutures removed without difficulty.  Patient tolerated procedure well.  Wound dressing was placed.  Patient advised on follow-up appointment next week.  Advised on return precautions.  He voices understanding of plan and reasons to return.  All questions answered.  Patient stable for discharge.   Final Clinical Impression(s) / ED Diagnoses Final diagnoses:  Encounter for removal of sutures    Rx / DC Orders ED Discharge Orders    None       Rodney Booze, PA-C 08/23/19 Chriss, Samak, DO 08/23/19 1613

## 2019-08-23 NOTE — ED Triage Notes (Signed)
Pt states he had sutures placed after a biopsy last Fri.  Here to have stitches removed.  No other complaints.  No s/s infection.

## 2019-08-25 ENCOUNTER — Emergency Department (EMERGENCY_DEPARTMENT_HOSPITAL): Payer: Medicare Other

## 2019-08-25 ENCOUNTER — Emergency Department (HOSPITAL_COMMUNITY): Payer: Medicare Other

## 2019-08-25 ENCOUNTER — Encounter (HOSPITAL_COMMUNITY): Payer: Self-pay

## 2019-08-25 ENCOUNTER — Other Ambulatory Visit: Payer: Self-pay

## 2019-08-25 ENCOUNTER — Emergency Department (HOSPITAL_COMMUNITY)
Admission: EM | Admit: 2019-08-25 | Discharge: 2019-08-25 | Disposition: A | Discharge status: Home / Self Care | Payer: Medicare Other | Attending: Emergency Medicine | Admitting: Emergency Medicine

## 2019-08-25 DIAGNOSIS — I959 Hypotension, unspecified: Secondary | ICD-10-CM | POA: Diagnosis not present

## 2019-08-25 DIAGNOSIS — M79604 Pain in right leg: Secondary | ICD-10-CM

## 2019-08-25 DIAGNOSIS — Z9889 Other specified postprocedural states: Secondary | ICD-10-CM | POA: Insufficient documentation

## 2019-08-25 DIAGNOSIS — Z7982 Long term (current) use of aspirin: Secondary | ICD-10-CM | POA: Insufficient documentation

## 2019-08-25 DIAGNOSIS — E119 Type 2 diabetes mellitus without complications: Secondary | ICD-10-CM | POA: Diagnosis not present

## 2019-08-25 DIAGNOSIS — I252 Old myocardial infarction: Secondary | ICD-10-CM | POA: Insufficient documentation

## 2019-08-25 DIAGNOSIS — I2510 Atherosclerotic heart disease of native coronary artery without angina pectoris: Secondary | ICD-10-CM | POA: Diagnosis not present

## 2019-08-25 DIAGNOSIS — R52 Pain, unspecified: Secondary | ICD-10-CM | POA: Diagnosis not present

## 2019-08-25 DIAGNOSIS — M79641 Pain in right hand: Secondary | ICD-10-CM | POA: Insufficient documentation

## 2019-08-25 DIAGNOSIS — E161 Other hypoglycemia: Secondary | ICD-10-CM | POA: Diagnosis not present

## 2019-08-25 DIAGNOSIS — Z79899 Other long term (current) drug therapy: Secondary | ICD-10-CM | POA: Insufficient documentation

## 2019-08-25 DIAGNOSIS — R200 Anesthesia of skin: Secondary | ICD-10-CM | POA: Diagnosis not present

## 2019-08-25 DIAGNOSIS — R9431 Abnormal electrocardiogram [ECG] [EKG]: Secondary | ICD-10-CM | POA: Diagnosis not present

## 2019-08-25 DIAGNOSIS — Z794 Long term (current) use of insulin: Secondary | ICD-10-CM | POA: Diagnosis not present

## 2019-08-25 DIAGNOSIS — M79643 Pain in unspecified hand: Secondary | ICD-10-CM | POA: Diagnosis not present

## 2019-08-25 DIAGNOSIS — R404 Transient alteration of awareness: Secondary | ICD-10-CM | POA: Diagnosis not present

## 2019-08-25 DIAGNOSIS — E162 Hypoglycemia, unspecified: Secondary | ICD-10-CM | POA: Diagnosis not present

## 2019-08-25 DIAGNOSIS — I10 Essential (primary) hypertension: Secondary | ICD-10-CM | POA: Diagnosis not present

## 2019-08-25 LAB — CBG MONITORING, ED: Glucose-Capillary: 77 mg/dL (ref 70–99)

## 2019-08-25 MED ORDER — HYDROCODONE-ACETAMINOPHEN 5-325 MG PO TABS
1.0000 | ORAL_TABLET | Freq: Once | ORAL | Status: DC
  Filled 2019-08-25: qty 1

## 2019-08-25 NOTE — ED Triage Notes (Signed)
Pt bib ems. Ems reports taht the pt was ona  Bus and called 911 for potential low blood pressure. Pressure 154/100. pts cbg was 74 then dropped down to 26. Pt aox4. Pt complaining of hand pain.   Hr 72 100%ra

## 2019-08-25 NOTE — Progress Notes (Signed)
Upper extremity venous has been completed.   Preliminary results in CV Proc.   Blanch Media 08/25/2019 6:56 PM

## 2019-08-25 NOTE — ED Provider Notes (Signed)
[facility] EMERGENCY DEPARTMENT Provider Note   CSN: [id number] Arrival date & time: 08/25/19  1702     History Chief Complaint  Patient presents with  . Hypotension    Paul [name]. is a 49 y.o. male with history of coronary artery disease, ischemic cardiomyopathy, type 2 diabetes mellitus, anxiety, bipolar disorder, hypertension, hyperlipidemia presents for evaluation of acute onset, constant right hand pain since this morning.  Reports pain is severe, radiates into all the digits of the palmar aspect of the hand and up into the forearm.  Unable to describe any aggravating or alleviating factors.  He has not tried anything for his symptoms.  He called out to EMS for his pain and reports that while he was being loaded into the ambulance he had an episode of nonbloody nonbilious emesis.  Denies chest pain, shortness of breath, abdominal pain, diarrhea, constipation, headaches or lightheadedness. He reportedly had a CBG of 74 with EMS and was given glucose with this but on recheck had apparently dropped to 26 but was asymptomatic from a hypoglycemia standpoint and POC CBG here is 135. Of note, he is 11 days status post biopsy of left sided neck mass. He is right-hand dominant. He is a poor historian.   The history is provided by the patient.       Past Medical History:  Diagnosis Date  . Anxiety   . Bipolar disorder ([place])   . CAD (coronary artery disease)    Anterior MI 2007 with DES stenting of an occluded LAD, distal 70% stenosis, OM 60% stenosis, circumflex 90% stenosis treated with a drug-eluting stent, PA 90% stenosis treated medically.  Cath January 2013 70-80% diagonal stenosis in a small vessel, OM 50% stenosis, PDA long 75% stenosis. EF 45%. He was managed medically.  . Cardiomyopathy, ischemic    EF 45%  . Diabetes mellitus   . History of chest pain 11/25/2010  . Hyperlipidemia   . Hypertension   . Myocardial infarction ([place])   . PTSD  (post-traumatic stress disorder)   . Tuberculosis    1989, took pills for it     Patient Active Problem List   Diagnosis Date Noted  . Neck mass 07/14/2019  . Hypotension 02/20/2019  . PTSD (post-traumatic stress disorder) 12/01/2018  . Cardiomyopathy ([place]) 01/14/2018  . Diabetic neuropathy ([place]) 01/10/2018  . Nausea alone 02/28/2013  . Cold sensation of skin 07/29/2012  . Hypertension   . CAD (coronary artery disease)   . Hyperlipidemia LDL goal <100   . Stented coronary artery 04/30/2011  . Atopic dermatitis 08/19/2010  . Anxiety state 09/27/2009  . Diabetes mellitus type 2, uncontrolled ([place]) 09/19/2009    Past Surgical History:  Procedure Laterality Date  . Catherization  08/25/11   Dr. Percival Spanish  . CORONARY ANGIOPLASTY WITH STENT PLACEMENT  207, 2011   4 stents placed  . DIRECT LARYNGOSCOPY Left 08/14/2019   Procedure: DIRECT LARYNGOSCOPY;  Surgeon: Rozetta Nunnery, MD;  Location: [facility];  Service: ENT;  Laterality: Left;  . LEFT HEART CATHETERIZATION WITH CORONARY ANGIOGRAM N/A 08/25/2011   Procedure: LEFT HEART CATHETERIZATION WITH CORONARY ANGIOGRAM;  Surgeon: Minus Breeding, MD;  Location[facility] C[facility] LAB;  Service: Cardiovascular;  Laterality: N/A;  . MASS BIOPSY Left 08/14/2019   Procedure: BIOPSY EXCISION OF LEFT NECK MASS;  Surgeon: Rozetta Nunnery, MD;  Location: [facility] OR;  Service: ENT;  Laterality: Left;       Family History  Problem Relation Age of Onset  .  Hypertension Mother   . Hypertension Father   . Diabetes Maternal Aunt   . Diabetes Maternal Uncle   . Cancer Maternal Grandmother     Social History   Tobacco Use  . Smoking status: Never Smoker  . Smokeless tobacco: Never Used  Substance Use Topics  . Alcohol use: No  . Drug use: No    Home Medications Prior to Admission medications   Medication Sig Start Date End Date Taking? Authorizing Provider  ARIPiprazole (ABILIFY) 5 MG tablet Take 1 tablet (5 mg total) by mouth daily. Patient not  taking: Reported on 08/08/2019 11/29/18   Guadalupe Dawn, MD  aspirin 81 MG EC tablet Take 1 tablet (81 mg total) by mouth daily. 09/30/17   Mikell, Jeani Sow, MD  Blood Glucose Monitoring Suppl (ONE TOUCH ULTRA 2) W/DEVICE KIT Check sugar once daily. ICD10 E11.65 01/24/15   Leone Brand, MD  carvedilol (COREG) 25 MG tablet Take 1 tablet (25 mg total) by mouth 2 (two) times daily with a meal. 10/04/18   Lockamy, Timothy, DO  citalopram (CELEXA) 20 MG tablet Take 1 tablet (20 mg total) by mouth 2 (two) times daily. 09/19/18   Nuala Alpha, DO  Continuous Glucose Monitor KIT 1 kit by Does not apply route daily. 02/20/19   Caroline More, DO  empagliflozin (JARDIANCE) 25 MG TABS tablet Take 25 mg by mouth daily. 07/25/19   Bonnita Hollow, MD  gabapentin (NEURONTIN) 100 MG capsule Take 1 capsule (100 mg total) by mouth 3 (three) times daily. Patient not taking: Reported on 08/08/2019 01/10/18   Tonette Bihari, MD  glucose blood (ONE TOUCH ULTRA TEST) test strip Check sugar once daily in the morning. ICD10 E11.65 01/10/18   Tonette Bihari, MD  HYDROcodone-acetaminophen (NORCO/VICODIN) 5-325 MG tablet Take 1-2 tablets by mouth every 6 (six) hours as needed for moderate pain. 08/14/19 08/13/20  Rozetta Nunnery, MD  insulin glargine (LANTUS) 100 unit/mL SOPN Take 25 units daily Patient taking differently: Inject 25 Units into the skin every morning.  03/13/19   Nuala Alpha, DO  isosorbide mononitrate (IMDUR) 30 MG 24 hr tablet Take 1 tablet (30 mg total) by mouth daily. 03/21/19   Lockamy, Christia [place], DO  liraglutide (VICTOZA) 18 MG/3ML SOPN Inject 0.2 mLs (1.2 mg total) into the skin daily. Patient not taking: Reported on 08/08/2019 03/21/19   Nuala Alpha, DO  metFORMIN (GLUCOPHAGE) 1000 MG tablet TAKE 1 TABLET BY MOUTH TWICE DAILY WITH A MEAL Patient taking differently: Take 1,000 mg by mouth 2 (two) times daily with a meal.  09/19/18   Nuala Alpha, DO  Multiple Vitamin  (MULTIVITAMIN WITH MINERALS) TABS tablet Take 1 tablet by mouth daily.    [provider]  nitroGLYCERIN (NITROSTAT) 0.4 MG SL tablet Place 1 tablet (0.4 mg total) under the tongue every 5 (five) minutes as needed. Patient taking differently: Place 0.4 mg under the tongue every 5 (five) minutes as needed for chest pain.  10/04/18   Nuala Alpha, DO  ONE TOUCH LANCETS MISC Use to check sugar once daily. ICD10 E11.65 01/09/15   Willeen Niece, MD  pravastatin (PRAVACHOL) 40 MG tablet Take 1 tablet (40 mg total) by mouth every evening. 09/19/18 09/14/19  Nuala Alpha, DO  quinapril-hydrochlorothiazide (ACCURETIC) 20-25 MG tablet Take 1 tablet by mouth daily. 06/13/19   [provider]  traZODone (DESYREL) 50 MG tablet Take 1 tablet (50 mg total) by mouth at bedtime as needed for sleep. Patient not taking: Reported on  08/08/2019 11/29/18   Guadalupe Dawn, MD  triamcinolone cream (KENALOG) 0.1 % Apply 1 application topically 2 (two) times daily. 08/01/19   Sherene Sires, DO    Allergies    Shrimp [shellfish allergy]  Review of Systems   Review of Systems  Constitutional: Negative for chills and fever.  Respiratory: Negative for shortness of breath.   Cardiovascular: Negative for chest pain.  Gastrointestinal: Positive for nausea and vomiting.  Musculoskeletal: Positive for arthralgias.  Neurological: Negative for light-headedness and headaches.  All other systems reviewed and are negative.   Physical Exam Updated Vital Signs BP 122/87 (BP Location: Right Arm)   Pulse 84   Temp 98.2 F (36.8 C) (Oral)   Resp 16   Ht 5' 11" (1.803 m)   Wt 69.9 kg   SpO2 100%   BMI 21.48 kg/m   Physical Exam Vitals and nursing note reviewed.  Constitutional:      General: He is not in acute distress.    Appearance: He is well-developed.  HENT:     Head: Normocephalic and atraumatic.  Eyes:     General:        Right eye: No discharge.        Left eye: No discharge.      Conjunctiva/sclera: Conjunctivae normal.     Pupils: Pupils are equal, round, and reactive to light.  Neck:     Vascular: No JVD.     Trachea: No tracheal deviation.     Comments: Swelling to left side of neck, tolerating oral secretions without difficulty. No erythema, swelling, or tenderness. No drainage noted.  Cardiovascular:     Rate and Rhythm: Normal rate and regular rhythm.     Pulses: Normal pulses.  Pulmonary:     Effort: Pulmonary effort is normal.     Breath sounds: Normal breath sounds.  Abdominal:     General: Abdomen is flat. Bowel sounds are normal. There is no distension.     Palpations: Abdomen is soft.     Tenderness: There is no abdominal tenderness. There is no guarding or rebound.  Musculoskeletal:        General: Tenderness present.     Cervical back: Normal range of motion and neck supple.     Comments: Generalized tenderness to palpation of the entire right hand, primarily along the palmar aspect. No erythema or swelling noted.  Patient hesitant to flex or extend digits of the right hand secondary to pain but is able to flex and extend at the wrist without any difficulty and otherwise 5/5 strength of BUE and BLE major muscle groups.  When distracted patient freely moves his hand without any apparent difficulty or pain.  Skin:    General: Skin is warm and dry.     Capillary Refill: Capillary refill takes 2 to 3 seconds.     Findings: No erythema.  Neurological:     Mental Status: He is alert.     Comments: Mental Status:  Alert, thought content appropriate, able to give a coherent history. Speech fluent without evidence of aphasia. Able to follow 2 step commands without difficulty.  Cranial Nerves:  II:  Peripheral visual fields grossly normal, pupils equal, round, reactive to light III,IV, VI: ptosis not present, extra-ocular motions intact bilaterally  V,VII: smile symmetric, facial light touch sensation equal VIII: hearing grossly normal to voice  X: uvula  elevates symmetrically  XI: bilateral shoulder shrug symmetric and strong XII: midline tongue extension without fassiculations Motor:  Normal tone. 5/5  strength of BUE and BLE major muscle groups except with grip strength of the right hand/movement of digits of right hand though much of this appears effort-based due to pain. Sensory: light touch normal in all extremities. Cerebellar: normal finger-to-nose with bilateral upper extremities, Romberg sign absent. Gait: normal gait and balance. Able to walk on toes and heels with ease.     Psychiatric:        Behavior: Behavior normal.     ED Results / Procedures / Treatments   Labs (all labs ordered are listed, but only abnormal results are displayed) Labs Reviewed  CBG MONITORING, ED  CBG MONITORING, ED    EKG EKG Interpretation  Date/Time:  Friday August 25 2019 17:07:18 EST Ventricular Rate:  73 PR Interval:    QRS Duration: 111 QT Interval:  435 QTC Calculation: 480 R Axis:   5 Text Interpretation: Sinus rhythm Consider anterior infarct Abnormal T, consider ischemia, lateral leads Baseline wander in lead(s) V6 since last tracing no significant change Confirmed by Noemi Chapel [phone]) on 08/25/2019 6:25:22 PM   Radiology DG Hand Complete Right  Result Date: 08/25/2019 CLINICAL DATA:  Numbness and tingling in the right hand EXAM: RIGHT HAND - COMPLETE 3+ VIEW COMPARISON:  None. FINDINGS: There is no evidence of fracture or dislocation. There is no evidence of arthropathy or other focal bone abnormality. Soft tissues are unremarkable. IMPRESSION: Negative. Electronically Signed   By: Donavan Foil M.D.   On: 08/25/2019 18:24   UE VENOUS DUPLEX ([facility] 7 am - 7 pm)  Result Date: 08/25/2019 UPPER VENOUS STUDY  Indications: Pain Comparison Study: no prior Performing Technologist: Abram Sander RVS  Examination Guidelines: A complete evaluation includes B-mode imaging, spectral Doppler, color Doppler, and power Doppler as needed  of all accessible portions of each vessel. Bilateral testing is considered an integral part of a complete examination. Limited examinations for reoccurring indications may be performed as noted.  Right Findings: +----------+------------+---------+-----------+----------+-------+ RIGHT     CompressiblePhasicitySpontaneousPropertiesSummary +----------+------------+---------+-----------+----------+-------+ IJV           Full       Yes       Yes                      +----------+------------+---------+-----------+----------+-------+ Subclavian    Full       Yes       Yes                      +----------+------------+---------+-----------+----------+-------+ Axillary      Full       Yes       Yes                      +----------+------------+---------+-----------+----------+-------+ Brachial      Full       Yes       Yes                      +----------+------------+---------+-----------+----------+-------+ Radial        Full                                          +----------+------------+---------+-----------+----------+-------+ Ulnar         Full                                          +----------+------------+---------+-----------+----------+-------+  Cephalic      Full                                          +----------+------------+---------+-----------+----------+-------+ Basilic       Full                                          +----------+------------+---------+-----------+----------+-------+  Summary:  Right: No evidence of deep vein thrombosis in the upper extremity. No evidence of superficial vein thrombosis in the upper extremity.  *See table(s) above for measurements and observations.    Preliminary     Procedures Procedures (including critical care time)  Medications Ordered in ED Medications  HYDROcodone-acetaminophen (NORCO/VICODIN) 5-325 MG per tablet 1 tablet (1 tablet Oral Refused 08/25/19 1905)    ED Course  I have reviewed  the triage vital signs and the nursing notes.  Pertinent labs & imaging results that were available during my care of the patient were reviewed by me and considered in my medical decision making (see chart for details).    MDM Rules/Calculators/A&P                      Patient presenting for evaluation of atraumatic right hand pain.  He is afebrile, initially mildly hypertensive in the ED but this resolved on reevaluation.  He is uncomfortable but nontoxic in appearance.  Peripheral pulses are intact and compartments are soft.  His extremities are equally warm though his capillary refill of the digits of the right upper extremity are little bit slow.  Sensation is intact.  He is reticent to move his digits but when distracted he is able to do so without difficulty.  Radiographs obtained which show no subcutaneous gas, swelling or acute osseous abnormality.  EKG shows normal sinus rhythm with no acute ischemic abnormalities.  Due to recent biopsy of neck mass a DVT study was obtained which was negative.  On reevaluation the patient is resting very comfortably in no apparent distress reports that he is feeling much better.  He tells me that he thinks that he was just out in the cold and his hand got cold.  With EMS he had mentioned that he had not checked his CBG that day so they checked it and it was 74.  They did give him glucose (unclear if amp of D50 or p.o. glucose was administered).  A CBG was rechecked in the field and was reportedly 26 but he was completely asymptomatic and his CBG here is over 100 so I suspect this is in error.  I tolerating p.o. in the ED and blood sugars maintained on recheck.  Encouraged him to continue taking his medications.  Considered possible arterial occlusion or peripheral arterial disease however with resolution of symptoms this can be evaluated on an outpatient basis.  Differential also includes Raynaud's, thoracic outlet syndrome, or vascular or neurogenic claudication.   Recommend follow-up with PCP this week.  Discussed strict ED return precautions. Patient verbalized understanding of and agreement with plan and is safe for discharge home at this time. Patient seen and evaluated by Dr. Sabra Heck who agrees with assessment and plan at this time.  Final Clinical Impression(s) / ED Diagnoses Final diagnoses:  Right hand pain    Rx / DC Orders ED  Discharge Orders    None       Debroah Baller 08/25/19 2054    Noemi Chapel, MD 08/25/19 [id number]

## 2019-08-25 NOTE — Discharge Instructions (Signed)
Continue taking all of your medications as prescribed.  You can take 1 to 2 tablets of Tylenol every 6 hours as needed for pain.  Please call your primary care provider for reevaluation and to schedule follow-up.  They may recommend seeing a vascular specialist who can evaluate for possible blockages in the extremities (arms or legs).  There was no evidence of blood clot on your work-up today.  Your x-ray was normal as well.  Return to the emergency department if any concerning signs or symptoms develop

## 2019-08-25 NOTE — ED Notes (Signed)
Pt provided "ED happy Meal"

## 2019-08-25 NOTE — ED Provider Notes (Signed)
This patient is a 49 year old male, he has presented today with right-sided hand pain which seem to have been going on since this morning.  States it was worse when he was cold, initially presented feeling as if he could not get comfortable with his hand however at this time the patient states he is back to his normal self and has absolutely no symptoms whatsoever.  On my exam he does have good pulses at the right radial artery with normal and symmetrical capillary refill.  He does not appear in distress and his vital signs are unremarkable.  He does have a submandibular mass mid to left side which has been recently biopsied.  He will follow up in the outpatient setting and has good follow-up.  Ultrasound of his venous system prior to discharge, anticipate his ability to go home  Medical screening examination/treatment/procedure(s) were conducted as a shared visit with non-physician practitioner(s) and myself.  I personally evaluated the patient during the encounter.  Clinical Impression:   Final diagnoses:  Right hand pain         Eber Hong, MD 08/25/19 2324

## 2019-08-25 NOTE — ED Notes (Signed)
Patient transported to X-ray 

## 2019-08-28 LAB — CBG MONITORING, ED: Glucose-Capillary: 135 mg/dL — ABNORMAL HIGH (ref 70–99)

## 2019-08-30 ENCOUNTER — Ambulatory Visit (INDEPENDENT_AMBULATORY_CARE_PROVIDER_SITE_OTHER): Payer: Medicare Other | Admitting: Otolaryngology

## 2019-08-30 ENCOUNTER — Other Ambulatory Visit: Payer: Self-pay

## 2019-08-30 DIAGNOSIS — Z4889 Encounter for other specified surgical aftercare: Secondary | ICD-10-CM

## 2019-08-30 NOTE — Progress Notes (Signed)
HPI: Paul [name]. is a 49 y.o. male who presents 2 weeks s/p DL/BX and left neck biopsy and drainage of abscess. Final pathology report revealed no evidence of neoplasm or cancer. Changes consistent with chronic inflammation. Sutures were removed last week in the emergency room. He has been doing well with no real pain no increased swelling.   Past Medical History:  Diagnosis Date  . Anxiety   . Bipolar disorder ([place])   . CAD (coronary artery disease)    Anterior MI 2007 with DES stenting of an occluded LAD, distal 70% stenosis, OM 60% stenosis, circumflex 90% stenosis treated with a drug-eluting stent, PA 90% stenosis treated medically.  Cath January 2013 70-80% diagonal stenosis in a small vessel, OM 50% stenosis, PDA long 75% stenosis. EF 45%. He was managed medically.  . Cardiomyopathy, ischemic    EF 45%  . Diabetes mellitus   . History of chest pain 11/25/2010  . Hyperlipidemia   . Hypertension   . Myocardial infarction ([place])   . PTSD (post-traumatic stress disorder)   . Tuberculosis    1989, took pills for it    Past Surgical History:  Procedure Laterality Date  . Catherization  08/25/11   Dr. Percival Spanish  . CORONARY ANGIOPLASTY WITH STENT PLACEMENT  207, 2011   4 stents placed  . DIRECT LARYNGOSCOPY Left 08/14/2019   Procedure: DIRECT LARYNGOSCOPY;  Surgeon: Rozetta Nunnery, MD;  Location: [facility];  Service: ENT;  Laterality: Left;  . LEFT HEART CATHETERIZATION WITH CORONARY ANGIOGRAM N/A 08/25/2011   Procedure: LEFT HEART CATHETERIZATION WITH CORONARY ANGIOGRAM;  Surgeon: Minus Breeding, MD;  Location: [facility] C[facility] LAB;  Service: Cardiovascular;  Laterality: N/A;  . MASS BIOPSY Left 08/14/2019   Procedure: BIOPSY EXCISION OF LEFT NECK MASS;  Surgeon: Rozetta Nunnery, MD;  Location: [facility] OR;  Service: ENT;  Laterality: Left;   Social History   Socioeconomic History  . Marital status: Single    Spouse name: Not on file  . Number of children: Not on file  . Years of  education: Not on file  . Highest education level: Not on file  Occupational History  . Not on file  Tobacco Use  . Smoking status: Never Smoker  . Smokeless tobacco: Never Used  Substance and Sexual Activity  . Alcohol use: No  . Drug use: No  . Sexual activity: Not on file  Other Topics Concern  . Not on file  Social History Narrative  . Not on file   Social Determinants of Health   Financial Resource Strain:   . Difficulty of Paying Living Expenses: Not on file  Food Insecurity:   . Worried About Charity fundraiser in the Last Year: Not on file  . Ran Out of Food in the Last Year: Not on file  Transportation Needs:   . Lack of Transportation (Medical): Not on file  . Lack of Transportation (Non-Medical): Not on file  Physical Activity:   . Days of Exercise per Week: Not on file  . Minutes of Exercise per Session: Not on file  Stress:   . Feeling of Stress : Not on file  Social Connections:   . Frequency of Communication with Friends and Family: Not on file  . Frequency of Social Gatherings with Friends and Family: Not on file  . Attends Religious Services: Not on file  . Active Member of Clubs or Organizations: Not on file  . Attends Archivist Meetings: Not on file  .  Marital Status: Not on file   Family History  Problem Relation Age of Onset  . Hypertension Mother   . Hypertension Father   . Diabetes Maternal Aunt   . Diabetes Maternal Uncle   . Cancer Maternal Grandmother    Allergies  Allergen Reactions  . Shrimp [Shellfish Allergy] Swelling    tongue   Prior to Admission medications   Medication Sig Start Date End Date Taking? Authorizing Provider  ARIPiprazole (ABILIFY) 5 MG tablet Take 1 tablet (5 mg total) by mouth daily. Patient not taking: Reported on 08/08/2019 11/29/18   Guadalupe Dawn, MD  aspirin 81 MG EC tablet Take 1 tablet (81 mg total) by mouth daily. 09/30/17   Mikell, Jeani Sow, MD  Blood Glucose Monitoring Suppl (ONE TOUCH  ULTRA 2) W/DEVICE KIT Check sugar once daily. ICD10 E11.65 01/24/15   Leone Brand, MD  carvedilol (COREG) 25 MG tablet Take 1 tablet (25 mg total) by mouth 2 (two) times daily with a meal. 10/04/18   Lockamy, Timothy, DO  citalopram (CELEXA) 20 MG tablet Take 1 tablet (20 mg total) by mouth 2 (two) times daily. 09/19/18   Nuala Alpha, DO  Continuous Glucose Monitor KIT 1 kit by Does not apply route daily. 02/20/19   Caroline More, DO  empagliflozin (JARDIANCE) 25 MG TABS tablet Take 25 mg by mouth daily. 07/25/19   Bonnita Hollow, MD  gabapentin (NEURONTIN) 100 MG capsule Take 1 capsule (100 mg total) by mouth 3 (three) times daily. Patient not taking: Reported on 08/08/2019 01/10/18   Tonette Bihari, MD  glucose blood (ONE TOUCH ULTRA TEST) test strip Check sugar once daily in the morning. ICD10 E11.65 01/10/18   Tonette Bihari, MD  HYDROcodone-acetaminophen (NORCO/VICODIN) 5-325 MG tablet Take 1-2 tablets by mouth every 6 (six) hours as needed for moderate pain. 08/14/19 08/13/20  Rozetta Nunnery, MD  insulin glargine (LANTUS) 100 unit/mL SOPN Take 25 units daily Patient taking differently: Inject 25 Units into the skin every morning.  03/13/19   Nuala Alpha, DO  isosorbide mononitrate (IMDUR) 30 MG 24 hr tablet Take 1 tablet (30 mg total) by mouth daily. 03/21/19   Lockamy, Christia Reading, DO  liraglutide (VICTOZA) 18 MG/3ML SOPN Inject 0.2 mLs (1.2 mg total) into the skin daily. Patient not taking: Reported on 08/08/2019 03/21/19   Nuala Alpha, DO  metFORMIN (GLUCOPHAGE) 1000 MG tablet TAKE 1 TABLET BY MOUTH TWICE DAILY WITH A MEAL Patient taking differently: Take 1,000 mg by mouth 2 (two) times daily with a meal.  09/19/18   Nuala Alpha, DO  Multiple Vitamin (MULTIVITAMIN WITH MINERALS) TABS tablet Take 1 tablet by mouth daily.    [provider]  nitroGLYCERIN (NITROSTAT) 0.4 MG SL tablet Place 1 tablet (0.4 mg total) under the tongue every 5 (five) minutes as  needed. Patient taking differently: Place 0.4 mg under the tongue every 5 (five) minutes as needed for chest pain.  10/04/18   Nuala Alpha, DO  ONE TOUCH LANCETS MISC Use to check sugar once daily. ICD10 E11.65 01/09/15   Willeen Niece, MD  pravastatin (PRAVACHOL) 40 MG tablet Take 1 tablet (40 mg total) by mouth every evening. 09/19/18 09/14/19  Nuala Alpha, DO  quinapril-hydrochlorothiazide (ACCURETIC) 20-25 MG tablet Take 1 tablet by mouth daily. 06/13/19   [provider]  traZODone (DESYREL) 50 MG tablet Take 1 tablet (50 mg total) by mouth at bedtime as needed for sleep. Patient not taking: Reported on 08/08/2019 11/29/18   Kris Mouton,  Edison Nasuti, MD  triamcinolone cream (KENALOG) 0.1 % Apply 1 application topically 2 (two) times daily. 08/01/19   Sherene Sires, DO     Physical Exam: Excision site is healing nicely with no signs of infection no drainage. Still has moderate swelling in the left submandibular area that is firm but not tender. No erythema. Intraoral examination reveals no obvious dental infection. Normal oral mucosa. Buccal mucosa and floor mouth are normal.   Assessment: S/p DL/Bx and left neck biopsy and drainage of abscess. Pathology report revealed no evidence of neoplasm.  Plan: At this point patient is doing well with no significant symptoms. He still has moderate swelling and induration of the left semitubular region. But no real pain. Recommended just observation for now. He will follow-up if he notices any increasing pain or swelling. Gave him a prescription for doxycycline to take if he develops any increasing pain. At time of surgery of the left neck findings were consistent with abscess lymphadenitis as the small portion of submandibular gland appeared normal.   Radene Journey, MD

## 2019-09-12 ENCOUNTER — Other Ambulatory Visit: Payer: Self-pay

## 2019-09-12 ENCOUNTER — Ambulatory Visit (INDEPENDENT_AMBULATORY_CARE_PROVIDER_SITE_OTHER): Payer: Medicare Other | Admitting: Family Medicine

## 2019-09-12 VITALS — BP 120/85 | HR 80 | Wt 148.0 lb

## 2019-09-12 DIAGNOSIS — M79641 Pain in right hand: Secondary | ICD-10-CM | POA: Diagnosis not present

## 2019-09-12 NOTE — Patient Instructions (Signed)
It was great to see you today! Thank you for letting me participate in your care!  Today, we discussed your right hand pain that occurs under exposure to anything cold. The best solution is avoidance of cold temperatures and conditions. Wear gloves even indoors and use hot hands in your pockets to keep your hands warm. If these measures aren't controlling your symptoms please follow back up with me in one month.  Be well, Jules Schick, DO PGY-3, [facility]

## 2019-09-15 NOTE — Assessment & Plan Note (Signed)
Leading differentials include Raynaud's, RA, or other autoimmune disorders such as systemic scleroderma. Patient exhibits no systemic symptoms to suggest scleroderma. No swelling, bony deformity, and pain pattern not consistent with RA, no changes seen on recent x-ray of the hand done in the ED. Most likely Raynaud's due to the fact that cold seems to bring on his symptoms and warmth alleviates symptoms. - Discussed starting medications such as calcium channel blocker vs cream to but decided wih conservative therapy such as using gloves indoors and hot hands, warm water, etc. - Will return in one month and if no improvement start calcium channel blocker

## 2019-09-15 NOTE — Progress Notes (Signed)
   CHIEF COMPLAINT / HPI: Right Hand Pain  Patient recently went to the ED for right hand pain. He states it started this year as the weather has gotten progressively colder. He states the pain is in his "knuckles" and "fingers" and feels like a "thrashing pain" that gets better with heat and worse with cold temperatures. The pain is intermittent. He denies smoking, dry eyes, dry mouth, excessive dental carries. He has no ulcerations of his skin, he has not noticed any rashes or nail bed changes.  PERTINENT  PMH / PSH: CAD, CHF, HTN, T2DM, Atopic Dermatitis, HLD   OBJECTIVE: BP 120/85   Pulse 80   Wt 148 lb (67.1 kg)   BMI 20.64 kg/m   Gen: Alert and Oriented x 3, NAD MSK: Right Hand: finger tips are pale compared to left, +2 distal radial pulse, no clubbing of the fingernails, no pitting, full active ROM, gross sensation intact, 5/5 strength in RUE, tender to palpation over the MCP joints, no swelling or obvious bony deformities.          Left Hand: no swelling, ecchymosis, or obvious bony deformities, non-tender to palpation, full active ROM, gross sensation intact, 5/5 strength in LUE, +2 distal radial pulse Ext: no clubbing, cyanosis, or edema Skin: warm, dry, intact, no rashes   ASSESSMENT / PLAN:  Right hand pain Leading differentials include Raynaud's, RA, or other autoimmune disorders such as systemic scleroderma. Patient exhibits no systemic symptoms to suggest scleroderma. No swelling, bony deformity, and pain pattern not consistent with RA, no changes seen on recent x-ray of the hand done in the ED. Most likely Raynaud's due to the fact that cold seems to bring on his symptoms and warmth alleviates symptoms. - Discussed starting medications such as calcium channel blocker vs cream to but decided wih conservative therapy such as using gloves indoors and hot hands, warm water, etc. - Will return in one month and if no improvement start calcium channel blocker     Arlyce Harman, DO, PGY-3 [facility]

## 2019-10-06 ENCOUNTER — Other Ambulatory Visit: Payer: Self-pay | Admitting: *Deleted

## 2019-10-06 DIAGNOSIS — I255 Ischemic cardiomyopathy: Secondary | ICD-10-CM

## 2019-10-07 MED ORDER — TRAZODONE HCL 50 MG PO TABS: 50.0000 mg | ORAL_TABLET | Freq: Every evening | ORAL | 0 refills | Status: DC | PRN

## 2019-10-07 MED ORDER — NITROGLYCERIN 0.4 MG SL SUBL: 0.4000 mg | SUBLINGUAL_TABLET | SUBLINGUAL | 1 refills | Status: AC | PRN

## 2019-10-07 MED ORDER — CARVEDILOL 25 MG PO TABS: 25.0000 mg | ORAL_TABLET | Freq: Two times a day (BID) | ORAL | 3 refills | Status: DC

## 2019-10-07 MED ORDER — ARIPIPRAZOLE 5 MG PO TABS: 5.0000 mg | ORAL_TABLET | Freq: Every day | ORAL | 0 refills | Status: DC

## 2019-10-18 ENCOUNTER — Encounter (INDEPENDENT_AMBULATORY_CARE_PROVIDER_SITE_OTHER): Payer: Self-pay

## 2019-10-20 ENCOUNTER — Other Ambulatory Visit: Payer: Self-pay | Admitting: Family Medicine

## 2019-10-20 MED ORDER — ARIPIPRAZOLE 5 MG PO TABS: 5.0000 mg | ORAL_TABLET | Freq: Every day | ORAL | 0 refills | Status: DC

## 2019-10-20 MED ORDER — TRAZODONE HCL 50 MG PO TABS: 50.0000 mg | ORAL_TABLET | Freq: Every evening | ORAL | 0 refills | Status: DC | PRN

## 2019-10-20 NOTE — Telephone Encounter (Signed)
Pt. Walked in requesting refills on all  medications stating he is out of medication including Aripiprazole and  Trazadone 50 mg sent to [facility]  Ph# [phone] Last appt. 2/21

## 2019-10-25 ENCOUNTER — Other Ambulatory Visit: Payer: Self-pay

## 2019-10-25 DIAGNOSIS — F411 Generalized anxiety disorder: Secondary | ICD-10-CM

## 2019-10-25 DIAGNOSIS — E1165 Type 2 diabetes mellitus with hyperglycemia: Secondary | ICD-10-CM

## 2019-10-25 MED ORDER — TRAZODONE HCL 50 MG PO TABS: 50.0000 mg | ORAL_TABLET | Freq: Every evening | ORAL | 0 refills | Status: DC | PRN

## 2019-10-25 MED ORDER — ARIPIPRAZOLE 5 MG PO TABS: 5.0000 mg | ORAL_TABLET | Freq: Every day | ORAL | 0 refills | Status: DC

## 2019-10-25 NOTE — Addendum Note (Signed)
Addended by: Veronda Prude on: 10/25/2019 09:29 AM   Modules accepted: Orders

## 2019-10-25 NOTE — Telephone Encounter (Signed)
Patient calls nurse line regarding medications not being received by pharmacy. Medications were sent in as "no print", changed rx to normal to be sent to patients preferred phamacy.   To PCP as FYI.   Veronda Prude, RN

## 2019-10-25 NOTE — Telephone Encounter (Signed)
Patient comes into front office stating that he needs the following medications refilled. Patient reports that pharmacy states that abilify and trazadone rx were only called in for one pill. Refill was sent in to dispense 1 pill. Adjusted to fill for 30 days as shown below. However, patient does request 90 day supplies for medication, was not sure if these medications could be dispensed this way.   To PCP  Please advise  Veronda Prude, RN

## 2019-10-26 MED ORDER — TRAZODONE HCL 50 MG PO TABS: 50.0000 mg | ORAL_TABLET | Freq: Every evening | ORAL | 0 refills | Status: DC | PRN

## 2019-10-26 MED ORDER — PRAVASTATIN SODIUM 40 MG PO TABS: 40.0000 mg | ORAL_TABLET | Freq: Every evening | ORAL | 3 refills | Status: DC

## 2019-10-26 MED ORDER — ARIPIPRAZOLE 5 MG PO TABS: 5.0000 mg | ORAL_TABLET | Freq: Every day | ORAL | 0 refills | Status: DC

## 2019-10-26 MED ORDER — CITALOPRAM HYDROBROMIDE 20 MG PO TABS: 20.0000 mg | ORAL_TABLET | Freq: Two times a day (BID) | ORAL | 3 refills | Status: AC

## 2019-10-26 MED ORDER — METFORMIN HCL 1000 MG PO TABS: 1000.0000 mg | ORAL_TABLET | Freq: Two times a day (BID) | ORAL | 3 refills | Status: DC

## 2019-11-04 ENCOUNTER — Emergency Department (HOSPITAL_COMMUNITY): Payer: Medicare Other

## 2019-11-04 ENCOUNTER — Encounter (HOSPITAL_COMMUNITY): Payer: Self-pay | Admitting: Emergency Medicine

## 2019-11-04 ENCOUNTER — Inpatient Hospital Stay (HOSPITAL_COMMUNITY)
Admission: EM | Admit: 2019-11-04 | Discharge: 2019-11-07 | DRG: 177 | Disposition: A | Discharge status: Home / Self Care | Payer: Medicare Other | Attending: Family Medicine | Admitting: Family Medicine

## 2019-11-04 ENCOUNTER — Other Ambulatory Visit: Payer: Self-pay

## 2019-11-04 DIAGNOSIS — J188 Other pneumonia, unspecified organism: Secondary | ICD-10-CM

## 2019-11-04 DIAGNOSIS — I959 Hypotension, unspecified: Secondary | ICD-10-CM | POA: Diagnosis present

## 2019-11-04 DIAGNOSIS — D509 Iron deficiency anemia, unspecified: Secondary | ICD-10-CM | POA: Diagnosis present

## 2019-11-04 DIAGNOSIS — R05 Cough: Secondary | ICD-10-CM | POA: Diagnosis not present

## 2019-11-04 DIAGNOSIS — J1282 Pneumonia due to coronavirus disease 2019: Secondary | ICD-10-CM | POA: Diagnosis not present

## 2019-11-04 DIAGNOSIS — E785 Hyperlipidemia, unspecified: Secondary | ICD-10-CM | POA: Diagnosis present

## 2019-11-04 DIAGNOSIS — E1165 Type 2 diabetes mellitus with hyperglycemia: Secondary | ICD-10-CM

## 2019-11-04 DIAGNOSIS — R0789 Other chest pain: Secondary | ICD-10-CM | POA: Diagnosis not present

## 2019-11-04 DIAGNOSIS — Z91013 Allergy to seafood: Secondary | ICD-10-CM

## 2019-11-04 DIAGNOSIS — I252 Old myocardial infarction: Secondary | ICD-10-CM

## 2019-11-04 DIAGNOSIS — F411 Generalized anxiety disorder: Secondary | ICD-10-CM | POA: Diagnosis present

## 2019-11-04 DIAGNOSIS — R001 Bradycardia, unspecified: Secondary | ICD-10-CM | POA: Diagnosis not present

## 2019-11-04 DIAGNOSIS — F319 Bipolar disorder, unspecified: Secondary | ICD-10-CM | POA: Diagnosis present

## 2019-11-04 DIAGNOSIS — Z20822 Contact with and (suspected) exposure to covid-19: Secondary | ICD-10-CM

## 2019-11-04 DIAGNOSIS — R7989 Other specified abnormal findings of blood chemistry: Secondary | ICD-10-CM

## 2019-11-04 DIAGNOSIS — J189 Pneumonia, unspecified organism: Secondary | ICD-10-CM

## 2019-11-04 DIAGNOSIS — Z955 Presence of coronary angioplasty implant and graft: Secondary | ICD-10-CM

## 2019-11-04 DIAGNOSIS — E871 Hypo-osmolality and hyponatremia: Secondary | ICD-10-CM | POA: Diagnosis not present

## 2019-11-04 DIAGNOSIS — E118 Type 2 diabetes mellitus with unspecified complications: Secondary | ICD-10-CM

## 2019-11-04 DIAGNOSIS — I2510 Atherosclerotic heart disease of native coronary artery without angina pectoris: Secondary | ICD-10-CM | POA: Diagnosis not present

## 2019-11-04 DIAGNOSIS — E861 Hypovolemia: Secondary | ICD-10-CM | POA: Diagnosis present

## 2019-11-04 DIAGNOSIS — Z8249 Family history of ischemic heart disease and other diseases of the circulatory system: Secondary | ICD-10-CM

## 2019-11-04 DIAGNOSIS — Z7982 Long term (current) use of aspirin: Secondary | ICD-10-CM

## 2019-11-04 DIAGNOSIS — E1122 Type 2 diabetes mellitus with diabetic chronic kidney disease: Secondary | ICD-10-CM | POA: Diagnosis present

## 2019-11-04 DIAGNOSIS — Z794 Long term (current) use of insulin: Secondary | ICD-10-CM

## 2019-11-04 DIAGNOSIS — I129 Hypertensive chronic kidney disease with stage 1 through stage 4 chronic kidney disease, or unspecified chronic kidney disease: Secondary | ICD-10-CM | POA: Diagnosis present

## 2019-11-04 DIAGNOSIS — R0602 Shortness of breath: Secondary | ICD-10-CM | POA: Diagnosis not present

## 2019-11-04 DIAGNOSIS — R079 Chest pain, unspecified: Secondary | ICD-10-CM | POA: Diagnosis not present

## 2019-11-04 DIAGNOSIS — F4310 Post-traumatic stress disorder, unspecified: Secondary | ICD-10-CM | POA: Diagnosis present

## 2019-11-04 DIAGNOSIS — Z79899 Other long term (current) drug therapy: Secondary | ICD-10-CM

## 2019-11-04 DIAGNOSIS — I255 Ischemic cardiomyopathy: Secondary | ICD-10-CM

## 2019-11-04 DIAGNOSIS — E11649 Type 2 diabetes mellitus with hypoglycemia without coma: Secondary | ICD-10-CM | POA: Diagnosis not present

## 2019-11-04 DIAGNOSIS — R778 Other specified abnormalities of plasma proteins: Secondary | ICD-10-CM

## 2019-11-04 DIAGNOSIS — R042 Hemoptysis: Secondary | ICD-10-CM | POA: Diagnosis present

## 2019-11-04 DIAGNOSIS — U071 COVID-19: Secondary | ICD-10-CM | POA: Diagnosis not present

## 2019-11-04 DIAGNOSIS — Z7952 Long term (current) use of systemic steroids: Secondary | ICD-10-CM

## 2019-11-04 DIAGNOSIS — N182 Chronic kidney disease, stage 2 (mild): Secondary | ICD-10-CM | POA: Diagnosis present

## 2019-11-04 DIAGNOSIS — E1140 Type 2 diabetes mellitus with diabetic neuropathy, unspecified: Secondary | ICD-10-CM | POA: Diagnosis present

## 2019-11-04 LAB — TRIGLYCERIDES: Triglycerides: 201 mg/dL — ABNORMAL HIGH (ref <150)

## 2019-11-04 LAB — CBC
HCT: 39.1 % (ref 39.0–52.0)
Hemoglobin: 11.6 g/dL — ABNORMAL LOW (ref 13.0–17.0)
MCH: 22.3 pg — ABNORMAL LOW (ref 26.0–34.0)
MCHC: 29.7 g/dL — ABNORMAL LOW (ref 30.0–36.0)
MCV: 75 fL — ABNORMAL LOW (ref 80.0–100.0)
Platelets: 339 10*3/uL (ref 150–400)
RBC: 5.21 MIL/uL (ref 4.22–5.81)
RDW: 19.1 % — ABNORMAL HIGH (ref 11.5–15.5)
WBC: 6.6 10*3/uL (ref 4.0–10.5)
nRBC: 0 % (ref 0.0–0.2)

## 2019-11-04 LAB — BASIC METABOLIC PANEL
Anion gap: 13 (ref 5–15)
BUN: 18 mg/dL (ref 6–20)
CO2: 25 mmol/L (ref 22–32)
Calcium: 8.6 mg/dL — ABNORMAL LOW (ref 8.9–10.3)
Chloride: 93 mmol/L — ABNORMAL LOW (ref 98–111)
Creatinine, Ser: 1.38 mg/dL — ABNORMAL HIGH (ref 0.61–1.24)
GFR calc Af Amer: >60 mL/min (ref >60)
GFR calc non Af Amer: 60 mL/min — ABNORMAL LOW (ref >60)
Glucose, Bld: 176 mg/dL — ABNORMAL HIGH (ref 70–99)
Potassium: 4.2 mmol/L (ref 3.5–5.1)
Sodium: 131 mmol/L — ABNORMAL LOW (ref 135–145)

## 2019-11-04 LAB — RESPIRATORY PANEL BY RT PCR (FLU A&B, COVID)
Influenza A by PCR: NEGATIVE
Influenza B by PCR: NEGATIVE
SARS Coronavirus 2 by RT PCR: POSITIVE — AB

## 2019-11-04 LAB — TROPONIN I (HIGH SENSITIVITY)
Troponin I (High Sensitivity): 49 ng/L — ABNORMAL HIGH (ref <18)
Troponin I (High Sensitivity): 46 ng/L — ABNORMAL HIGH (ref <18)

## 2019-11-04 LAB — LACTIC ACID, PLASMA: Lactic Acid, Venous: 2.3 mmol/L (ref 0.5–1.9)

## 2019-11-04 LAB — FERRITIN: Ferritin: 71 ng/mL (ref 24–336)

## 2019-11-04 LAB — LACTATE DEHYDROGENASE: LDH: 178 U/L (ref 98–192)

## 2019-11-04 LAB — D-DIMER, QUANTITATIVE: D-Dimer, Quant: 0.97 ug/mL-FEU — ABNORMAL HIGH (ref 0.00–0.50)

## 2019-11-04 LAB — PROCALCITONIN: Procalcitonin: 0.12 ng/mL

## 2019-11-04 LAB — FIBRINOGEN: Fibrinogen: 516 mg/dL — ABNORMAL HIGH (ref 210–475)

## 2019-11-04 LAB — POC SARS CORONAVIRUS 2 AG -  ED: SARS Coronavirus 2 Ag: POSITIVE — AB

## 2019-11-04 LAB — C-REACTIVE PROTEIN: CRP: 5.5 mg/dL — ABNORMAL HIGH (ref <1.0)

## 2019-11-04 MED ORDER — SODIUM CHLORIDE 0.9 % IV SOLN
200.0000 mg | Freq: Once | INTRAVENOUS | Status: AC
  Administered 2019-11-05: 200 mg via INTRAVENOUS
  Filled 2019-11-04: qty 40

## 2019-11-04 MED ORDER — ZINC SULFATE 220 (50 ZN) MG PO CAPS
220.0000 mg | ORAL_CAPSULE | Freq: Every day | ORAL | Status: DC
  Administered 2019-11-05: 220 mg via ORAL
  Filled 2019-11-04: qty 1

## 2019-11-04 MED ORDER — SODIUM CHLORIDE 0.9 % IV SOLN
1.0000 g | Freq: Once | INTRAVENOUS | Status: AC
  Administered 2019-11-04: 1 g via INTRAVENOUS
  Filled 2019-11-04: qty 10

## 2019-11-04 MED ORDER — ASPIRIN 81 MG PO CHEW
324.0000 mg | CHEWABLE_TABLET | Freq: Once | ORAL | Status: AC
  Administered 2019-11-04: 324 mg via ORAL
  Filled 2019-11-04: qty 4

## 2019-11-04 MED ORDER — AZITHROMYCIN 250 MG PO TABS
500.0000 mg | ORAL_TABLET | Freq: Once | ORAL | Status: AC
  Administered 2019-11-04: 500 mg via ORAL
  Filled 2019-11-04: qty 2

## 2019-11-04 MED ORDER — INSULIN ASPART 100 UNIT/ML ~~LOC~~ SOLN: 0.0000 [IU] | Freq: Three times a day (TID) | SUBCUTANEOUS | Status: DC

## 2019-11-04 MED ORDER — CARVEDILOL 12.5 MG PO TABS
12.5000 mg | ORAL_TABLET | Freq: Two times a day (BID) | ORAL | Status: DC
  Administered 2019-11-05 – 2019-11-07 (×5): 12.5 mg via ORAL
  Filled 2019-11-04 (×5): qty 1

## 2019-11-04 MED ORDER — PRAVASTATIN SODIUM 40 MG PO TABS
40.0000 mg | ORAL_TABLET | Freq: Every evening | ORAL | Status: DC
  Administered 2019-11-05 – 2019-11-06 (×2): 40 mg via ORAL
  Filled 2019-11-04 (×2): qty 1

## 2019-11-04 MED ORDER — SODIUM CHLORIDE 0.9 % IV SOLN
1000.0000 mL | INTRAVENOUS | Status: DC
  Administered 2019-11-04: 1000 mL via INTRAVENOUS

## 2019-11-04 MED ORDER — ACETAMINOPHEN 325 MG PO TABS: 650.0000 mg | ORAL_TABLET | Freq: Four times a day (QID) | ORAL | Status: DC | PRN

## 2019-11-04 MED ORDER — ENOXAPARIN SODIUM 40 MG/0.4ML ~~LOC~~ SOLN
40.0000 mg | SUBCUTANEOUS | Status: DC
  Administered 2019-11-05 – 2019-11-07 (×3): 40 mg via SUBCUTANEOUS
  Filled 2019-11-04 (×3): qty 0.4

## 2019-11-04 MED ORDER — CITALOPRAM HYDROBROMIDE 10 MG PO TABS: 20.0000 mg | ORAL_TABLET | Freq: Two times a day (BID) | ORAL | Status: DC

## 2019-11-04 MED ORDER — ASCORBIC ACID 500 MG PO TABS
500.0000 mg | ORAL_TABLET | Freq: Every day | ORAL | Status: DC
  Administered 2019-11-05: 500 mg via ORAL
  Filled 2019-11-04: qty 1

## 2019-11-04 MED ORDER — IOHEXOL 350 MG/ML SOLN
75.0000 mL | Freq: Once | INTRAVENOUS | Status: AC | PRN
  Administered 2019-11-04: 75 mL via INTRAVENOUS

## 2019-11-04 MED ORDER — INSULIN GLARGINE 100 UNIT/ML ~~LOC~~ SOLN
10.0000 [IU] | Freq: Every day | SUBCUTANEOUS | Status: DC
  Administered 2019-11-05: 10 [IU] via SUBCUTANEOUS
  Filled 2019-11-04 (×2): qty 0.1

## 2019-11-04 MED ORDER — LINAGLIPTIN 5 MG PO TABS
5.0000 mg | ORAL_TABLET | Freq: Every day | ORAL | Status: DC
  Administered 2019-11-05: 5 mg via ORAL
  Filled 2019-11-04 (×2): qty 1

## 2019-11-04 MED ORDER — SODIUM CHLORIDE 0.9 % IV BOLUS
1000.0000 mL | Freq: Once | INTRAVENOUS | Status: AC
  Administered 2019-11-04: 1000 mL via INTRAVENOUS

## 2019-11-04 MED ORDER — BENZONATATE 100 MG PO CAPS
100.0000 mg | ORAL_CAPSULE | Freq: Once | ORAL | Status: AC
  Administered 2019-11-04: 100 mg via ORAL
  Filled 2019-11-04: qty 1

## 2019-11-04 MED ORDER — SODIUM CHLORIDE 0.9% FLUSH: 3.0000 mL | Freq: Once | INTRAVENOUS | Status: DC

## 2019-11-04 MED ORDER — SODIUM CHLORIDE 0.9 % IV SOLN
100.0000 mg | Freq: Every day | INTRAVENOUS | Status: DC
  Administered 2019-11-05 – 2019-11-07 (×3): 100 mg via INTRAVENOUS
  Filled 2019-11-04 (×5): qty 20

## 2019-11-04 MED ORDER — ARIPIPRAZOLE 5 MG PO TABS
5.0000 mg | ORAL_TABLET | Freq: Every day | ORAL | Status: DC
  Administered 2019-11-05 – 2019-11-07 (×3): 5 mg via ORAL
  Filled 2019-11-04 (×3): qty 1

## 2019-11-04 MED ORDER — POLYETHYLENE GLYCOL 3350 17 G PO PACK
17.0000 g | PACK | Freq: Every day | ORAL | Status: DC | PRN
  Administered 2019-11-06: 17 g via ORAL
  Filled 2019-11-04: qty 1

## 2019-11-04 NOTE — ED Notes (Signed)
Informed PA [place] pt covid test pos

## 2019-11-04 NOTE — H&P (Addendum)
[facility] [name] Admission History and Physical Service Pager: [phone]  Patient name: Paul [name]. Medical record number: [id number] Date of birth: 1971/04/22 Age: 49 y.o. Gender: male  Primary Care Provider: Nuala Alpha, DO Consultants: None  Code Status: Full Preferred Emergency Contact: Paul [name], Ex-wife, [phone]  Chief Complaint: Cough/chest pain/SOB  Assessment and Plan: Paul [name]. is a 49 y.o. male presenting with . PMH is significant for CAD with prior MI, cardiomyopathy with EF 40-45% in 2019, diabetes, and bipolar disorder.   Multifocal pneumonia 2/2 COVID-19 Infection: Acute, stable.  Acute worsening of dyspnea and chest pain today, in setting of 2-week subjective fever and productive cough.  Hypotensive (SBP 60s/80s) on arrival that resolved quickly with IVF, otherwise afebrile and hemodynamically stable. No increased WOB on RA, coarse crackles within RLL consistent with CT chest showing multifocal pneumonia within this region. Certainly suspect his clinical picture is secondary to Covid due to COVID positive in the ED.  Mild increase in inflammatory markers with CRP 5.5 and fibrinogen 516 with LA 2.3.  Non-smoker without history of COPD which makes an exacerbation much less likely.  D-dimer 0.97 in the setting of Covid, no PE seen on CTA. Reassuringly procalcitonin 0.12, low likelihood of concurrent bacterial pneumonia and will discontinue ceftriaxone/azithromycin.  Suspect will be stable for discharge on 3/28 if remained stable overnight. -Admit to [name], attending Dr. Owens [name] -Start remdesivir IV due to requiring hospitalization, however may not need full course -Start linagliptin 5 mg due to concurrent diabetes -Will hold on dexamethasone as not requiring oxygen -Prone as often as possible -Vitamin C and zinc supplementation -Trend fibrinogen, CRP, LDH, D-dimer -Follow-up blood cultures obtained -Oxygen  supplementation if O2 <94% consistently   Chest pain: Acute, resolved.  Reported nonradiating chest tightness while experiencing worsening SOB earlier today, resolved once arrived to ED with IVF (did not receive nitro via EMS).  EKG NSR without acute ischemic changes noted. Troponin 49 >46.  Low concern for cardiac etiology, likely related to demand/symptomatic Covid as above.  While he does have a history of ischemic cardiomyopathy with an EF of 40-45%, he appears mildly hypovolemic on exam with clinical presentation more consistent for above so doubt exacerbation. -Monitor symptoms -Supportive/medication support for Covid as above  Non-insulin-dependent diabetes: Chronic, stable. Last A1c 7.6 in 07/2019.  CBG 176 on admit. Taking Metformin, lantus, and Jardiance outpatient.  No longer taking Victoza due to cost.  Prescribed Lantus 25 units, however has been taking 10 at home. -Obtain hemoglobin A1c -Hold home Metformin and Jardiance while inpatient  -SSI with meals and at bedtime -CBGs with meals/bedtime -Continue Lantus 10 units -Start linagliptin 5 mg for concurrent Covid as above  Ischemic cardiomyopathy, CAD with prior MI: Stable. EF 40-45% in 01/2018.  Euvolemic. Follows with Dr. Percival [name], last seen virtually in 02/2019 and recommended follow-up in 1 year.  Takes Coreg, Imdur, pravastatin, and quinapril-HCTZ at home. -Hold home Imdur and quinapril-HCTZ given hypotension on arrival -Start half dose of Coreg 12.5 tomorrow to avoid rebound if BP allows -Continue home pravastatin -Consider obtaining up-to-date echocardiogram as last in 2019  Hypotension in the setting of hypertension: Chronic, stable. SBP 60s/80s on arrival with rapid improvement following IVF in the setting of infection and hypovolemia.  Takes Coreg, Imdur, and quinapril-HCTZ at home. -Hold home medications as discussed above   BPD: Chronic, stable.  No SI or HI.  Reports well managed on Abilify and Celexa. -Continue  home Abilify 5 mg and Celexa 40 mg  CKD stage II: Stable.  Cr 1.38, baseline around 1.08-1.2. May have mild acute injury currently in the setting of hypotension/infection as above however will likely resolve with IVF. -Monitor BMP -IVF overnight  Mild hyponatremia: Stable.  Na 131, corrected to 132-133 for hyperglycemia.  Likely hypovolemic hyponatremia, could be related to HCTZ use at home. -NS at 100 mL/hour overnight, s/p 1L -Monitor BMP  Microcytic, hypochromic anemia: Stable.  Hgb 11.6 on admit, BL 13-14.  Suspect IDA with RBC morphology, reports recent blood-tinged sputum, but otherwise denies blood loss elsewhere.  Ferritin 71, likely truly lower as currently has acute inflammation. -Obtain iron panel -Monitor CBC -Consider colonoscopy as outpatient, no family history of colon cancer  Hyperlipidemia: Chronic, stable. Secondary prevention. LDL 72 in 2019. -Continue home pravastatin -Obtain lipid panel, goal <70   FEN/GI: Carb modified diet, IVF x12 hours  Prophylaxis: Lovenox  Disposition: MedSurg, attending Dr. Owens [name]  History of Present Illness:  Paul [name]. is a 49 y.o. male presenting with acute worsening of shortness of breath and chest pain, in the setting of 2-week history of subjective fever and productive cough.  He reports approximately 1-2 weeks ago he started feeling febrile, however checked his temperature via thermometer.  For the past week he has been frequently coughing with sputum production, in the last few days has noted some blood-tinged sputum.  He is unemployed and lives with a roommate, has been trying to stay at home however went to a thrift store today and started feeling progressively short of breath as it became more cold outside associated with chest tightness.  Reports it felt like that he "needed oxygen in his lungs." Pressure-like chest pain without radiation, his chest pain and shortness of breath started to improve after he received IV  fluids and antibiotics.  Has had occasional nonbloody diarrhea and little appetite, otherwise denies any congestion, sore throat, myalgias, loss of taste and smell.  His roommate recently received his Covid vaccine and had a cough a few days afterwards but is otherwise felt fine.  He has worn his mask in the community, does not wear it at home.  ED course: On arrival via EMS he was noted to be hypotensive to 67/51 but otherwise vitals WNL.  Received 1 L NS with significant improvement of BPs into the 299-242 systolic.  D-dimer slightly elevated, obtained CT PE scan showing multifocal pneumonia and no PE.  Satting at 94-95% on room air, has not needed oxygen.  Labs showing troponin 49 and 46, creatinine 1.38, hemoglobin 11.6.  Triglycerides 201, LDH 178, procalcitonin 0.12, CRP 5.5.  Covid positive, influenza negative.  ED request admission due to significant hypotension on arrival and symptomatic pneumonia.  Review Of Systems: Per HPI with the following additions:   Review of Systems  Constitutional: Positive for fever.  HENT: Negative for congestion and sore throat.   Eyes: Negative for blurred vision.  Respiratory: Positive for cough, sputum production and shortness of breath. Negative for hemoptysis and wheezing.   Cardiovascular: Positive for chest pain. Negative for palpitations, leg swelling and PND.  Gastrointestinal: Positive for diarrhea. Negative for abdominal pain, blood in stool, constipation, melena, nausea and vomiting.  Genitourinary: Negative for dysuria and hematuria.  Musculoskeletal: Negative for myalgias.  Neurological: Negative for dizziness and headaches.    Patient Active Problem List   Diagnosis Date Noted  . Neck mass 07/14/2019  . Hypotension 02/20/2019  . PTSD (post-traumatic stress disorder) 12/01/2018  . Cardiomyopathy ([place]) 01/14/2018  . Diabetic neuropathy ([place])  01/10/2018  . Nausea alone 02/28/2013  . Cold sensation of skin 07/29/2012  . Right hand pain  05/03/2012  . Hypertension   . CAD (coronary artery disease)   . Hyperlipidemia LDL goal <100   . Stented coronary artery 04/30/2011  . Atopic dermatitis 08/19/2010  . Anxiety state 09/27/2009  . Diabetes mellitus type 2, uncontrolled ([place]) 09/19/2009    Past Medical History: Past Medical History:  Diagnosis Date  . Anxiety   . Bipolar disorder ([place])   . CAD (coronary artery disease)    Anterior MI 2007 with DES stenting of an occluded LAD, distal 70% stenosis, OM 60% stenosis, circumflex 90% stenosis treated with a drug-eluting stent, PA 90% stenosis treated medically.  Cath January 2013 70-80% diagonal stenosis in a small vessel, OM 50% stenosis, PDA long 75% stenosis. EF 45%. He was managed medically.  . Cardiomyopathy, ischemic    EF 45%  . Diabetes mellitus   . History of chest pain 11/25/2010  . Hyperlipidemia   . Hypertension   . Myocardial infarction ([place])   . PTSD (post-traumatic stress disorder)   . Tuberculosis    1989, took pills for it     Past Surgical History: Past Surgical History:  Procedure Laterality Date  . Catherization  08/25/11   Dr. Percival [name]  . CORONARY ANGIOPLASTY WITH STENT PLACEMENT  207, 2011   4 stents placed  . DIRECT LARYNGOSCOPY Left 08/14/2019   Procedure: DIRECT LARYNGOSCOPY;  Surgeon: Rozetta Nunnery, MD;  Location: [facility];  Service: ENT;  Laterality: Left;  . LEFT HEART CATHETERIZATION WITH CORONARY ANGIOGRAM N/A 08/25/2011   Procedure: LEFT HEART CATHETERIZATION WITH CORONARY ANGIOGRAM;  Surgeon: Minus Breeding, MD;  Location: [facility] CATH LAB;  Service: Cardiovascular;  Laterality: N/A;  . MASS BIOPSY Left 08/14/2019   Procedure: BIOPSY EXCISION OF LEFT NECK MASS;  Surgeon: Rozetta Nunnery, MD;  Location[facility] OR;  Service: ENT;  Laterality: Left;    Social History: Social History   Tobacco Use  . Smoking status: Never Smoker  . Smokeless tobacco: Never Used  Substance Use Topics  . Alcohol use: No  . Drug use: No   Additional  social history: Lives with a roommate, not currently employed.  Denies tobacco or alcohol use. Please also refer to relevant sections of EMR.  Family History: Family History  Problem Relation Age of Onset  . Hypertension Mother   . Hypertension Father   . Diabetes Maternal Aunt   . Diabetes Maternal Uncle   . Cancer Maternal Grandmother    Allergies and Medications: Allergies  Allergen Reactions  . Shrimp [Shellfish Allergy] Swelling    tongue   No current facility-administered medications on file prior to encounter.   Current Outpatient Medications on File Prior to Encounter  Medication Sig Dispense Refill  . ARIPiprazole (ABILIFY) 5 MG tablet Take 1 tablet (5 mg total) by mouth daily. 30 tablet 0  . aspirin 81 MG EC tablet Take 1 tablet (81 mg total) by mouth daily. 30 tablet 11  . Blood Glucose Monitoring Suppl (ONE TOUCH ULTRA 2) W/DEVICE KIT Check sugar once daily. ICD10 E11.65 1 each 0  . carvedilol (COREG) 25 MG tablet Take 1 tablet (25 mg total) by mouth 2 (two) times daily with a meal. 180 tablet 3  . citalopram (CELEXA) 20 MG tablet Take 1 tablet (20 mg total) by mouth 2 (two) times daily. 180 tablet 3  . Continuous Glucose Monitor KIT 1 kit by Does not apply route daily. 1  kit 0  . empagliflozin (JARDIANCE) 25 MG TABS tablet Take 25 mg by mouth daily. 90 tablet 0  . glucose blood (ONE TOUCH ULTRA TEST) test strip Check sugar once daily in the morning. ICD10 E11.65 30 each 5  . HYDROcodone-acetaminophen (NORCO/VICODIN) 5-325 MG tablet Take 1-2 tablets by mouth every 6 (six) hours as needed for moderate pain. 16 tablet 0  . insulin glargine (LANTUS) 100 unit/mL SOPN Take 25 units daily (Patient taking differently: Inject 25 Units into the skin every morning. ) 25 mL 11  . isosorbide mononitrate (IMDUR) 30 MG 24 hr tablet Take 1 tablet (30 mg total) by mouth daily. 90 tablet 6  . liraglutide (VICTOZA) 18 MG/3ML SOPN Inject 0.2 mLs (1.2 mg total) into the skin daily. 3 mL 3   . metFORMIN (GLUCOPHAGE) 1000 MG tablet Take 1 tablet (1,000 mg total) by mouth 2 (two) times daily with a meal. 60 tablet 3  . Multiple Vitamin (MULTIVITAMIN WITH MINERALS) TABS tablet Take 1 tablet by mouth daily.    . nitroGLYCERIN (NITROSTAT) 0.4 MG SL tablet Place 1 tablet (0.4 mg total) under the tongue every 5 (five) minutes as needed for chest pain. 30 tablet 1  . ONE TOUCH LANCETS MISC Use to check sugar once daily. ICD10 E11.65 30 each 5  . pravastatin (PRAVACHOL) 40 MG tablet Take 1 tablet (40 mg total) by mouth every evening. 90 tablet 3  . quinapril-hydrochlorothiazide (ACCURETIC) 20-25 MG tablet Take 1 tablet by mouth daily.    . traZODone (DESYREL) 50 MG tablet Take 1 tablet (50 mg total) by mouth at bedtime as needed for sleep. 30 tablet 0  . triamcinolone cream (KENALOG) 0.1 % Apply 1 application topically 2 (two) times daily. 453.6 g 0  . gabapentin (NEURONTIN) 100 MG capsule Take 1 capsule (100 mg total) by mouth 3 (three) times daily. (Patient not taking: Reported on 08/08/2019) 90 capsule 3    Objective: BP (!) 134/94   Pulse 97   Temp 98.9 F (37.2 C) (Rectal)   Resp 15   SpO2 96%  Exam: General: Alert, NAD, sitting up comfortably HEENT: NCAT, slightly dry mucous membranes Cardiac: RRR no m/g/r Lungs: Clear anteriorly with coarse crackles bibasilarly with predominance within right base, no increased work of breathing, able to speak in full sentences without stopping, on room air Abdomen: soft, non-tender, non-distended  Msk: Moves all extremities spontaneously  Ext: Extremities slightly cool with palpable dorsalis pedis and posterior tibialis pulses bilaterally, 2+ distal pulses, no edema Derm: No rashes noted Psych: Appropriate mood and affect, hold normal conversation  Labs and Imaging: CBC BMET  Recent Labs  Lab 11/04/19 1729  WBC 6.6  HGB 11.6*  HCT 39.1  PLT 339   Recent Labs  Lab 11/04/19 1729  NA 131*  K 4.2  CL 93*  CO2 25  BUN 18   CREATININE 1.38*  GLUCOSE 176*  CALCIUM 8.6Patriciaann Clan, DO 11/04/2019, 10:21 PM PGY-2, [place] [facility] pager: [phone], text pages welcome

## 2019-11-04 NOTE — ED Provider Notes (Signed)
[facility] EMERGENCY DEPARTMENT Provider Note   CSN: [id number] Arrival date & time: 11/04/19  1713     History Chief Complaint  Patient presents with  . Chest Pain    Kandon Hosking. is a 49 y.o. male.  The history is provided by the patient and medical records. No language interpreter was used.  Chest Pain    49 year old male with history of CAD, cardiomyopathy, hypotension, diabetes,  prior MI, bipolar brought here via EMS from home for evaluation of chest pain.  Patient report a week ago he experienced some subjective fever and chills.  He then isolate himself.  For the past 4 days he has had persistent cough with flegm as well as specks of blood.  Also endorsed sinus congestion, sneezing, throat irritation.  He endorsed mild shortness of breath.  He does endorse a mild pleuritic chest pain.  He denies lightheadedness or dizziness no nausea vomiting or diarrhea no loss of taste or smell, no abdominal pain or back pain no calf tenderness or leg swelling.  No prior history of PE or DVT.  He is currently on Plavix.  He denies exertional chest pain.  Denies any recent Covid exposure.  No history of cancer.  Past Medical History:  Diagnosis Date  . Anxiety   . Bipolar disorder ([place])   . CAD (coronary artery disease)    Anterior MI 2007 with DES stenting of an occluded LAD, distal 70% stenosis, OM 60% stenosis, circumflex 90% stenosis treated with a drug-eluting stent, PA 90% stenosis treated medically.  Cath January 2013 70-80% diagonal stenosis in a small vessel, OM 50% stenosis, PDA long 75% stenosis. EF 45%. He was managed medically.  . Cardiomyopathy, ischemic    EF 45%  . Diabetes mellitus   . History of chest pain 11/25/2010  . Hyperlipidemia   . Hypertension   . Myocardial infarction ([place])   . PTSD (post-traumatic stress disorder)   . Tuberculosis    1989, took pills for it     Patient Active Problem List   Diagnosis Date Noted  . Neck mass  07/14/2019  . Hypotension 02/20/2019  . PTSD (post-traumatic stress disorder) 12/01/2018  . Cardiomyopathy ([place]) 01/14/2018  . Diabetic neuropathy ([place]) 01/10/2018  . Nausea alone 02/28/2013  . Cold sensation of skin 07/29/2012  . Right hand pain 05/03/2012  . Hypertension   . CAD (coronary artery disease)   . Hyperlipidemia LDL goal <100   . Stented coronary artery 04/30/2011  . Atopic dermatitis 08/19/2010  . Anxiety state 09/27/2009  . Diabetes mellitus type 2, uncontrolled ([place]) 09/19/2009    Past Surgical History:  Procedure Laterality Date  . Catherization  08/25/11   Dr. Percival Spanish  . CORONARY ANGIOPLASTY WITH STENT PLACEMENT  207, 2011   4 stents placed  . DIRECT LARYNGOSCOPY Left 08/14/2019   Procedure: DIRECT LARYNGOSCOPY;  Surgeon: Rozetta Nunnery, MD;  Location: [facility];  Service: ENT;  Laterality: Left;  . LEFT HEART CATHETERIZATION WITH CORONARY ANGIOGRAM N/A 08/25/2011   Procedure: LEFT HEART CATHETERIZATION WITH CORONARY ANGIOGRAM;  Surgeon: Minus Breeding, MD;  Location: [facility] CATH LAB;  Service: Cardiovascular;  Laterality: N/A;  . MASS BIOPSY Left 08/14/2019   Procedure: BIOPSY EXCISION OF LEFT NECK MASS;  Surgeon: Rozetta Nunnery, MD;  Location: [facility] OR;  Service: ENT;  Laterality: Left;       Family History  Problem Relation Age of Onset  . Hypertension Mother   . Hypertension Father   . Diabetes  Maternal Aunt   . Diabetes Maternal Uncle   . Cancer Maternal Grandmother     Social History   Tobacco Use  . Smoking status: Never Smoker  . Smokeless tobacco: Never Used  Substance Use Topics  . Alcohol use: No  . Drug use: No    Home Medications Prior to Admission medications   Medication Sig Start Date End Date Taking? Authorizing Provider  ARIPiprazole (ABILIFY) 5 MG tablet Take 1 tablet (5 mg total) by mouth daily. 10/26/19   Nuala Alpha, DO  aspirin 81 MG EC tablet Take 1 tablet (81 mg total) by mouth daily. 09/30/17   Mikell, Jeani Sow,  MD  Blood Glucose Monitoring Suppl (ONE TOUCH ULTRA 2) W/DEVICE KIT Check sugar once daily. ICD10 E11.65 01/24/15   Leone Brand, MD  carvedilol (COREG) 25 MG tablet Take 1 tablet (25 mg total) by mouth 2 (two) times daily with a meal. 10/07/19   Lockamy, Timothy, DO  citalopram (CELEXA) 20 MG tablet Take 1 tablet (20 mg total) by mouth 2 (two) times daily. 10/26/19   Nuala Alpha, DO  Continuous Glucose Monitor KIT 1 kit by Does not apply route daily. 02/20/19   Caroline More, DO  empagliflozin (JARDIANCE) 25 MG TABS tablet Take 25 mg by mouth daily. 07/25/19   Bonnita Hollow, MD  gabapentin (NEURONTIN) 100 MG capsule Take 1 capsule (100 mg total) by mouth 3 (three) times daily. Patient not taking: Reported on 08/08/2019 01/10/18   Tonette Bihari, MD  glucose blood (ONE TOUCH ULTRA TEST) test strip Check sugar once daily in the morning. ICD10 E11.65 01/10/18   Tonette Bihari, MD  HYDROcodone-acetaminophen (NORCO/VICODIN) 5-325 MG tablet Take 1-2 tablets by mouth every 6 (six) hours as needed for moderate pain. 08/14/19 08/13/20  Rozetta Nunnery, MD  insulin glargine (LANTUS) 100 unit/mL SOPN Take 25 units daily Patient taking differently: Inject 25 Units into the skin every morning.  03/13/19   Nuala Alpha, DO  isosorbide mononitrate (IMDUR) 30 MG 24 hr tablet Take 1 tablet (30 mg total) by mouth daily. 03/21/19   Lockamy, Christia [place], DO  liraglutide (VICTOZA) 18 MG/3ML SOPN Inject 0.2 mLs (1.2 mg total) into the skin daily. Patient not taking: Reported on 08/08/2019 03/21/19   Nuala Alpha, DO  metFORMIN (GLUCOPHAGE) 1000 MG tablet Take 1 tablet (1,000 mg total) by mouth 2 (two) times daily with a meal. 10/26/19   Nuala Alpha, DO  Multiple Vitamin (MULTIVITAMIN WITH MINERALS) TABS tablet Take 1 tablet by mouth daily.    [provider]  nitroGLYCERIN (NITROSTAT) 0.4 MG SL tablet Place 1 tablet (0.4 mg total) under the tongue every 5 (five) minutes as needed for  chest pain. 10/07/19   Nuala Alpha, DO  ONE TOUCH LANCETS MISC Use to check sugar once daily. ICD10 E11.65 01/09/15   Willeen Niece, MD  pravastatin (PRAVACHOL) 40 MG tablet Take 1 tablet (40 mg total) by mouth every evening. 10/26/19 10/20/20  Nuala Alpha, DO  quinapril-hydrochlorothiazide (ACCURETIC) 20-25 MG tablet Take 1 tablet by mouth daily. 06/13/19   [provider]  traZODone (DESYREL) 50 MG tablet Take 1 tablet (50 mg total) by mouth at bedtime as needed for sleep. 10/26/19   Nuala Alpha, DO  triamcinolone cream (KENALOG) 0.1 % Apply 1 application topically 2 (two) times daily. 08/01/19   Sherene Sires, DO    Allergies    Shrimp [shellfish allergy]  Review of Systems   Review of Systems  Cardiovascular: Positive for  chest pain.  All other systems reviewed and are negative.   Physical Exam Updated Vital Signs BP 103/80 (BP Location: Right Arm)   Pulse 97   Temp 98.7 F (37.1 C) (Oral)   Resp 16   SpO2 96%   Physical Exam Vitals and nursing note reviewed.  Constitutional:      General: He is not in acute distress.    Appearance: He is well-developed.  HENT:     Head: Atraumatic.  Eyes:     Conjunctiva/sclera: Conjunctivae normal.  Cardiovascular:     Rate and Rhythm: Tachycardia present.     Pulses: Normal pulses.     Heart sounds: Normal heart sounds.  Pulmonary:     Effort: Pulmonary effort is normal.     Breath sounds: Rales (Crackles heard at right lower lung base.) present. No wheezing or rhonchi.  Abdominal:     Palpations: Abdomen is soft.     Tenderness: There is no abdominal tenderness.  Musculoskeletal:        General: No swelling or tenderness.     Cervical back: Neck supple.  Skin:    Capillary Refill: Capillary refill takes less than 2 seconds.     Findings: No rash.  Neurological:     Mental Status: He is alert and oriented to person, place, and time.  Psychiatric:        Mood and Affect: Mood normal.     ED Results /  Procedures / Treatments   Labs (all labs ordered are listed, but only abnormal results are displayed) Labs Reviewed  BASIC METABOLIC PANEL - Abnormal; Notable for the following components:      Result Value   Sodium 131 (*)    Chloride 93 (*)    Glucose, Bld 176 (*)    Creatinine, Ser 1.38 (*)    Calcium 8.6 (*)    GFR calc non Af Amer 60 (*)    All other components within normal limits  CBC - Abnormal; Notable for the following components:   Hemoglobin 11.6 (*)    MCV 75.0 (*)    MCH 22.3 (*)    MCHC 29.7 (*)    RDW 19.1 (*)    All other components within normal limits  D-DIMER, QUANTITATIVE (NOT AT [facility]) - Abnormal; Notable for the following components:   D-Dimer, Quant 0.97 (*)    All other components within normal limits  LACTIC ACID, PLASMA - Abnormal; Notable for the following components:   Lactic Acid, Venous 2.3 (*)    All other components within normal limits  TROPONIN I (HIGH SENSITIVITY) - Abnormal; Notable for the following components:   Troponin I (High Sensitivity) 49 (*)    All other components within normal limits  TROPONIN I (HIGH SENSITIVITY) - Abnormal; Notable for the following components:   Troponin I (High Sensitivity) 46 (*)    All other components within normal limits  SARS CORONAVIRUS 2 (TAT 6-24 HRS)  CULTURE, BLOOD (ROUTINE X 2)  CULTURE, BLOOD (ROUTINE X 2)  PROCALCITONIN  LACTATE DEHYDROGENASE  FERRITIN  TRIGLYCERIDES  FIBRINOGEN  C-REACTIVE PROTEIN  POC SARS CORONAVIRUS 2 AG -  ED    EKG EKG Interpretation  Date/Time:  Saturday November 04 2019 17:18:24 EDT Ventricular Rate:  99 PR Interval:  190 QRS Duration: 82 QT Interval:  354 QTC Calculation: 454 R Axis:   52 Text Interpretation: Normal sinus rhythm Possible Inferior infarct , age undetermined Anterior infarct , age undetermined Abnormal ECG No acute changes Confirmed by  Varney Biles ([id number]) on 11/04/2019 8:21:35 PM   Radiology CT Angio Chest PE W and/or Wo Contrast  Result  Date: 11/04/2019 CLINICAL DATA:  Hemoptysis. EXAM: CT ANGIOGRAPHY CHEST WITH CONTRAST TECHNIQUE: Multidetector CT imaging of the chest was performed using the standard protocol during bolus administration of intravenous contrast. Multiplanar CT image reconstructions and MIPs were obtained to evaluate the vascular anatomy. CONTRAST:  66m OMNIPAQUE IOHEXOL 350 MG/ML SOLN COMPARISON:  None. FINDINGS: Cardiovascular: Contrast injection is sufficient to demonstrate satisfactory opacification of the pulmonary arteries to the segmental level. There is no pulmonary embolus. The main pulmonary artery is dilated measuring approximately 3.5 cm. There is no CT evidence of acute right heart strain. There are mild atherosclerotic changes of the thoracic aorta without evidence for an aneurysm. Heart size is mildly enlarged with coronary artery calcifications noted. There is no significant pericardial effusion. Mediastinum/Nodes: --there are few mildly enlarged mediastinal and hilar lymph nodes. For example there is a right hilar lymph node measuring approximately 1.4 cm ( axial series 6, image 125). --No axillary lymphadenopathy. --No supraclavicular lymphadenopathy. --Normal thyroid gland. --The esophagus is unremarkable Lungs/Pleura: There are areas of consolidation involving the posterior left upper lobe, anterior right middle lobe, and bilateral lower lobes, right greater than left. There is no pneumothorax or significant pleural effusion. Upper Abdomen: No acute abnormality. Musculoskeletal: No chest wall abnormality. No acute or significant osseous findings. Review of the MIP images confirms the above findings. IMPRESSION: 1. No acute pulmonary embolism. 2. Multifocal areas of consolidation, greatest at the right lung base, concerning for multifocal pneumonia. 3. Dilated main pulmonary artery which can be seen in patients with elevated pulmonary artery pressures. 4. Cardiomegaly with coronary artery disease. Aortic  Atherosclerosis (ICD10-I70.0). Electronically Signed   By: CConstance HolsterM.D.   On: 11/04/2019 20:39   DG Chest Portable 1 View  Result Date: 11/04/2019 CLINICAL DATA:  Cough, hemoptysis, chest pain for 45 minutes EXAM: PORTABLE CHEST 1 VIEW COMPARISON:  06/18/2015 FINDINGS: The heart size and mediastinal contours are within normal limits. Both lungs are clear. The visualized skeletal structures are unremarkable. IMPRESSION: No active disease. Electronically Signed   By: MRanda NgoM.D.   On: 11/04/2019 18:29    Procedures .Critical Care Performed by: TDomenic Moras PA-C Authorized by: TDomenic Moras PA-C   Critical care provider statement:    Critical care time (minutes):  52   Critical care was time spent personally by me on the following activities:  Discussions with consultants, evaluation of patient's response to treatment, examination of patient, ordering and performing treatments and interventions, ordering and review of laboratory studies, ordering and review of radiographic studies, pulse oximetry, re-evaluation of patient's condition, obtaining history from patient or surrogate and review of old charts   (including critical care time)  Medications Ordered in ED Medications  sodium chloride flush (NS) 0.9 % injection 3 mL (3 mLs Intravenous Not Given 11/04/19 2125)  0.9 %  sodium chloride infusion (1,000 mLs Intravenous New Bag/Given 11/04/19 2124)  sodium chloride 0.9 % bolus 1,000 mL (0 mLs Intravenous Stopped 11/04/19 2121)  benzonatate (TESSALON) capsule 100 mg (100 mg Oral Given 11/04/19 1932)  aspirin chewable tablet 324 mg (324 mg Oral Given 11/04/19 1931)  iohexol (OMNIPAQUE) 350 MG/ML injection 75 mL (75 mLs Intravenous Contrast Given 11/04/19 2031)  cefTRIAXone (ROCEPHIN) 1 g in sodium chloride 0.9 % 100 mL IVPB (0 g Intravenous Stopped 11/04/19 2156)  azithromycin (ZITHROMAX) tablet 500 mg (500 mg Oral Given 11/04/19 2122)  ED Course  I have reviewed the triage vital  signs and the nursing notes.  Pertinent labs & imaging results that were available during my care of the patient were reviewed by me and considered in my medical decision making (see chart for details).    MDM Rules/Calculators/A&P                      BP 103/80 (BP Location: Right Arm)   Pulse 97   Temp 98.7 F (37.1 C) (Oral)   Resp 16   SpO2 96%   Final Clinical Impression(s) / ED Diagnoses Final diagnoses:  Multifocal pneumonia  Suspected COVID-19 virus infection  Elevated troponin I measurement    Rx / DC Orders ED Discharge Orders    None     5:58 PM Patient here with cold symptoms as well as hemoptysis for the past week.  Symptoms concerning for potential infectious etiology.  Low suspicion for ACS.  Will obtain COVID-19 testing, chest x-ray, and additional work-up initiated.  7:18 PM Blood pressure is soft at 83/59 after multiple rechecks.  Patient however denies generalized weakness or lightheadedness.  IV fluid given.  Troponin is elevated at 49.  Patient denies any active chest pain.  Aspirin given.  Heart score 5, moderate risk of [place].  8:32 PM Repeat troponin shows improvement at 46. Patient does have elevated lactic acid of 2.3. He also has evidence of AKI with a creatinine of 1.38. Chest x-ray without any acute finding. Mild elevated D-dimer of 0.97 however given elevated D-dimer as well as hemoptysis, will obtain chest CT angiogram.  8:46 PM  CT angiogram of the chest demonstrate no evidence of PE however there are multifocal area of consolidation greatest at the right lung base concerning for multifocal pneumonia.  COVID-19 test is currently pending however have high suspicion for Covid infection.  In the setting of multifocal pneumonia, hypotension, tachycardia, elevated lactic acid, code sepsis initiated, will preemptively treat with antibiotic while awaits Covid results.  10:27 PM Appreciate consultation from [facility] resident who agrees to see and admit pt for  obs.    Epimenio Sayf Kerner. was evaluated in Emergency Department on 11/04/2019 for the symptoms described in the history of present illness. He was evaluated in the context of the global COVID-19 pandemic, which necessitated consideration that the patient might be at risk for infection with the SARS-CoV-2 virus that causes COVID-19. Institutional protocols and algorithms that pertain to the evaluation of patients at risk for COVID-19 are in a state of rapid change based on information released by regulatory bodies including the CDC and federal and state organizations. These policies and algorithms were followed during the patient's care in the ED.    Domenic Moras, PA-C 11/05/19 [place], Ankit, MD 11/06/19 2024

## 2019-11-04 NOTE — ED Triage Notes (Signed)
Pt to triage via [facility].  Reports chest pain x 45 min.  Denies SOB, nausea, and vomiting.  States he has been coughing up blood x 4 days.  EMS administered 4 baby ASA.

## 2019-11-04 NOTE — ED Notes (Signed)
Previous RN stated cultures were colleted. Lab called with critical lactic, this RN inquired about cultures & was advised that none had come down. Will recollect

## 2019-11-05 ENCOUNTER — Encounter (HOSPITAL_COMMUNITY): Payer: Self-pay | Admitting: Family Medicine

## 2019-11-05 DIAGNOSIS — E1122 Type 2 diabetes mellitus with diabetic chronic kidney disease: Secondary | ICD-10-CM | POA: Diagnosis present

## 2019-11-05 DIAGNOSIS — E118 Type 2 diabetes mellitus with unspecified complications: Secondary | ICD-10-CM | POA: Diagnosis not present

## 2019-11-05 DIAGNOSIS — J1282 Pneumonia due to coronavirus disease 2019: Secondary | ICD-10-CM

## 2019-11-05 DIAGNOSIS — I2510 Atherosclerotic heart disease of native coronary artery without angina pectoris: Secondary | ICD-10-CM

## 2019-11-05 DIAGNOSIS — I2583 Coronary atherosclerosis due to lipid rich plaque: Secondary | ICD-10-CM

## 2019-11-05 DIAGNOSIS — E861 Hypovolemia: Secondary | ICD-10-CM | POA: Diagnosis present

## 2019-11-05 DIAGNOSIS — I959 Hypotension, unspecified: Secondary | ICD-10-CM | POA: Diagnosis present

## 2019-11-05 DIAGNOSIS — U071 COVID-19: Secondary | ICD-10-CM | POA: Diagnosis present

## 2019-11-05 DIAGNOSIS — F4310 Post-traumatic stress disorder, unspecified: Secondary | ICD-10-CM | POA: Diagnosis present

## 2019-11-05 DIAGNOSIS — R0602 Shortness of breath: Secondary | ICD-10-CM | POA: Diagnosis not present

## 2019-11-05 DIAGNOSIS — Z7982 Long term (current) use of aspirin: Secondary | ICD-10-CM | POA: Diagnosis not present

## 2019-11-05 DIAGNOSIS — N182 Chronic kidney disease, stage 2 (mild): Secondary | ICD-10-CM | POA: Diagnosis present

## 2019-11-05 DIAGNOSIS — E1165 Type 2 diabetes mellitus with hyperglycemia: Secondary | ICD-10-CM | POA: Diagnosis present

## 2019-11-05 DIAGNOSIS — I252 Old myocardial infarction: Secondary | ICD-10-CM | POA: Diagnosis not present

## 2019-11-05 DIAGNOSIS — E871 Hypo-osmolality and hyponatremia: Secondary | ICD-10-CM | POA: Diagnosis present

## 2019-11-05 DIAGNOSIS — I255 Ischemic cardiomyopathy: Secondary | ICD-10-CM | POA: Diagnosis present

## 2019-11-05 DIAGNOSIS — D509 Iron deficiency anemia, unspecified: Secondary | ICD-10-CM | POA: Diagnosis present

## 2019-11-05 DIAGNOSIS — E785 Hyperlipidemia, unspecified: Secondary | ICD-10-CM | POA: Diagnosis present

## 2019-11-05 DIAGNOSIS — E162 Hypoglycemia, unspecified: Secondary | ICD-10-CM | POA: Diagnosis not present

## 2019-11-05 DIAGNOSIS — Z8249 Family history of ischemic heart disease and other diseases of the circulatory system: Secondary | ICD-10-CM | POA: Diagnosis not present

## 2019-11-05 DIAGNOSIS — R042 Hemoptysis: Secondary | ICD-10-CM | POA: Diagnosis present

## 2019-11-05 DIAGNOSIS — Z7952 Long term (current) use of systemic steroids: Secondary | ICD-10-CM | POA: Diagnosis not present

## 2019-11-05 DIAGNOSIS — I129 Hypertensive chronic kidney disease with stage 1 through stage 4 chronic kidney disease, or unspecified chronic kidney disease: Secondary | ICD-10-CM | POA: Diagnosis present

## 2019-11-05 DIAGNOSIS — Z794 Long term (current) use of insulin: Secondary | ICD-10-CM | POA: Diagnosis not present

## 2019-11-05 DIAGNOSIS — F411 Generalized anxiety disorder: Secondary | ICD-10-CM | POA: Diagnosis present

## 2019-11-05 DIAGNOSIS — F319 Bipolar disorder, unspecified: Secondary | ICD-10-CM | POA: Diagnosis present

## 2019-11-05 DIAGNOSIS — Z955 Presence of coronary angioplasty implant and graft: Secondary | ICD-10-CM | POA: Diagnosis not present

## 2019-11-05 DIAGNOSIS — Z79899 Other long term (current) drug therapy: Secondary | ICD-10-CM | POA: Diagnosis not present

## 2019-11-05 LAB — COMPREHENSIVE METABOLIC PANEL
ALT: 14 U/L (ref 0–44)
AST: 23 U/L (ref 15–41)
Albumin: 2.9 g/dL — ABNORMAL LOW (ref 3.5–5.0)
Alkaline Phosphatase: 49 U/L (ref 38–126)
Anion gap: 10 (ref 5–15)
BUN: 16 mg/dL (ref 6–20)
CO2: 26 mmol/L (ref 22–32)
Calcium: 8.1 mg/dL — ABNORMAL LOW (ref 8.9–10.3)
Chloride: 96 mmol/L — ABNORMAL LOW (ref 98–111)
Creatinine, Ser: 1.18 mg/dL (ref 0.61–1.24)
GFR calc Af Amer: >60 mL/min (ref >60)
GFR calc non Af Amer: >60 mL/min (ref >60)
Glucose, Bld: 200 mg/dL — ABNORMAL HIGH (ref 70–99)
Potassium: 3.8 mmol/L (ref 3.5–5.1)
Sodium: 132 mmol/L — ABNORMAL LOW (ref 135–145)
Total Bilirubin: 0.7 mg/dL (ref 0.3–1.2)
Total Protein: 7.6 g/dL (ref 6.5–8.1)

## 2019-11-05 LAB — GLUCOSE, CAPILLARY
Glucose-Capillary: 78 mg/dL (ref 70–99)
Glucose-Capillary: 81 mg/dL (ref 70–99)
Glucose-Capillary: 54 mg/dL — ABNORMAL LOW (ref 70–99)
Glucose-Capillary: 66 mg/dL — ABNORMAL LOW (ref 70–99)
Glucose-Capillary: 50 mg/dL — ABNORMAL LOW (ref 70–99)
Glucose-Capillary: 62 mg/dL — ABNORMAL LOW (ref 70–99)
Glucose-Capillary: 82 mg/dL (ref 70–99)
Glucose-Capillary: 139 mg/dL — ABNORMAL HIGH (ref 70–99)

## 2019-11-05 LAB — LIPID PANEL
Cholesterol: 120 mg/dL (ref 0–200)
HDL: 18 mg/dL — ABNORMAL LOW (ref >40)
LDL Cholesterol: 60 mg/dL (ref 0–99)
Total CHOL/HDL Ratio: 6.7 RATIO
Triglycerides: 210 mg/dL — ABNORMAL HIGH (ref <150)
VLDL: 42 mg/dL — ABNORMAL HIGH (ref 0–40)

## 2019-11-05 LAB — CBC WITH DIFFERENTIAL/PLATELET
Abs Immature Granulocytes: 0 10*3/uL (ref 0.00–0.07)
Basophils Absolute: 0 10*3/uL (ref 0.0–0.1)
Basophils Relative: 0 %
Eosinophils Absolute: 0 10*3/uL (ref 0.0–0.5)
Eosinophils Relative: 0 %
HCT: 36.2 % — ABNORMAL LOW (ref 39.0–52.0)
Hemoglobin: 10.9 g/dL — ABNORMAL LOW (ref 13.0–17.0)
Immature Granulocytes: 0 %
Lymphocytes Relative: 39 %
Lymphs Abs: 1.8 10*3/uL (ref 0.7–4.0)
MCH: 22.2 pg — ABNORMAL LOW (ref 26.0–34.0)
MCHC: 30.1 g/dL (ref 30.0–36.0)
MCV: 73.6 fL — ABNORMAL LOW (ref 80.0–100.0)
Monocytes Absolute: 0.3 10*3/uL (ref 0.1–1.0)
Monocytes Relative: 7 %
Neutro Abs: 2.4 10*3/uL (ref 1.7–7.7)
Neutrophils Relative %: 54 %
Platelets: 311 10*3/uL (ref 150–400)
RBC: 4.92 MIL/uL (ref 4.22–5.81)
RDW: 19 % — ABNORMAL HIGH (ref 11.5–15.5)
WBC: 4.5 10*3/uL (ref 4.0–10.5)
nRBC: 0 % (ref 0.0–0.2)

## 2019-11-05 LAB — IRON AND TIBC
Iron: 13 ug/dL — ABNORMAL LOW (ref 45–182)
Saturation Ratios: 5 % — ABNORMAL LOW (ref 17.9–39.5)
TIBC: 253 ug/dL (ref 250–450)
UIBC: 240 ug/dL

## 2019-11-05 LAB — FIBRINOGEN: Fibrinogen: 568 mg/dL — ABNORMAL HIGH (ref 210–475)

## 2019-11-05 LAB — D-DIMER, QUANTITATIVE: D-Dimer, Quant: 0.9 ug/mL-FEU — ABNORMAL HIGH (ref 0.00–0.50)

## 2019-11-05 LAB — ABO/RH: ABO/RH(D): B POS

## 2019-11-05 LAB — HIV ANTIBODY (ROUTINE TESTING W REFLEX): HIV Screen 4th Generation wRfx: NONREACTIVE

## 2019-11-05 LAB — LACTATE DEHYDROGENASE: LDH: 187 U/L (ref 98–192)

## 2019-11-05 LAB — HEMOGLOBIN A1C
Hgb A1c MFr Bld: 8.6 % — ABNORMAL HIGH (ref 4.8–5.6)
Mean Plasma Glucose: 200.12 mg/dL

## 2019-11-05 LAB — C-REACTIVE PROTEIN: CRP: 6.3 mg/dL — ABNORMAL HIGH (ref <1.0)

## 2019-11-05 MED ORDER — ASPIRIN 81 MG PO CHEW
81.0000 mg | CHEWABLE_TABLET | Freq: Every day | ORAL | Status: DC
  Administered 2019-11-05 – 2019-11-07 (×3): 81 mg via ORAL
  Filled 2019-11-05 (×3): qty 1

## 2019-11-05 MED ORDER — FERROUS SULFATE 325 (65 FE) MG PO TABS
325.0000 mg | ORAL_TABLET | Freq: Every day | ORAL | Status: DC
  Administered 2019-11-05 – 2019-11-07 (×3): 325 mg via ORAL
  Filled 2019-11-05 (×3): qty 1

## 2019-11-05 MED ORDER — SODIUM CHLORIDE 0.9 % IV SOLN
1000.0000 mL | INTRAVENOUS | Status: AC
  Administered 2019-11-05: 1000 mL via INTRAVENOUS

## 2019-11-05 MED ORDER — CLOPIDOGREL BISULFATE 75 MG PO TABS
75.0000 mg | ORAL_TABLET | Freq: Every day | ORAL | Status: DC
  Administered 2019-11-05 – 2019-11-07 (×3): 75 mg via ORAL
  Filled 2019-11-05 (×3): qty 1

## 2019-11-05 MED ORDER — CITALOPRAM HYDROBROMIDE 10 MG PO TABS
20.0000 mg | ORAL_TABLET | Freq: Two times a day (BID) | ORAL | Status: DC
  Administered 2019-11-06 – 2019-11-07 (×3): 20 mg via ORAL
  Filled 2019-11-05 (×4): qty 2

## 2019-11-05 NOTE — Discharge Summary (Addendum)
I have reviewed the below documentation. I have seen the patient on the day of discharge and agree with the below exam.  In addition to below, I recommend the following at follow up: - Hypoglycemia- unawareness. Recommend slowly restarting SGLT2 and GLP1 as able rather than insulin.  - Repeat CBC at follow up, recommend GI evaluation for IDA.  - Consider repeat chest imaging in 4-6 weeks pending progression   Paul Singh, MD  [facility] Discharge Summary  Patient name: Paul [name]. Medical record number: [id number] Date of birth: Dec 13, 1970 Age: 49 y.o. Gender: male Date of Admission: 11/04/2019  Date of Discharge: 11/05/19 Admitting Physician: Martyn Malay, MD  Primary Care Provider: Nuala Alpha, DO Consultants: None  Indication for Hospitalization: SOB 2/2 COVID multifocal pneumonia  Discharge Diagnoses/Problem List:  COVID-19 multifocal pneumonia Type 2 diabetes Ischemic cardiomyopathy, EF 40-45% CAD, prior MI Hypertension Bipolar disorder Hyperlipidemia Probable CKD stage II Microcytic anemia  Disposition: Home   Discharge Condition: Stable  Discharge Exam:   General: Alert, pleasant, normal WOB Cardio: Normal S1 and S2, RRR. Pulm: CTAB, normal WOB, not requiring oxygen  Abdomen: Bowel sounds normal. Abdomen soft and non-tender.  Extremities: No peripheral edema. Warm/ well perfused.  Strong radial pulse  Neuro: Cranial nerves grossly intact  Brief Hospital Course:  Paul [name] is a 49 year old gentleman; with a history of hypertension, ischemic cardiomyopathy with a EF of 40-45%, bipolar disorder, and type 2 diabetes; who presented on 3/27 for acute worsening dyspnea and chest pressure in the setting of approximately 2-week history of productive cough and subjective fever. CTA showing multifocal pneumonia within the right lower base, no PE.  Found to be COVID positive, admitted  for symptomatic illness with hypotension on arrival, however fortunately remained stable on room air for entirety of hospitalization. Troponins trended flat without ischemic EKG changes. Mild increase in inflammatory markers (CRP 5.5, fibrinogen 500) that trended down with normal procalcitonin.  He was managed with IV remdesivir, linagliptin, and IVF providing significant improvement in his symptoms, but continued Remdesivir for 2 additional doses at [facility] at discharge. Patient experienced significant hypoglycemia during hospital stay on sliding scale, Lantus and Linaglipitin. These were stopped on the day of discharge and should be switched to Metformin only on discharge.  At discharge, he was hemodynamically stable and breathing appropriately on room air.    Issues for Follow Up:   1. Tested positive on 3/27, should quarantine for additional 14 days. Discharged with 1 additional dose--transportation arranged.  2. Initially quite hypotensive on arrival (SBP 60s-80s) that rapidly improved with IVF, felt to be in the setting of Covid and hypovolemia.  Due to this, held home antihypertensive during stay and sent home with 12.33m Coreg BID.  Please monitor BP restart medications as appropriate. 3. Monitor CBC.  Iron deficiency anemia with hemoglobin 11.8 at discharge, started on oral iron.  Asymptomatic w/o reports of blood loss (w/ exception of minimal blood tinged sputum). Should have colonoscopy outpatient to evaluate GI source.  4. Patient experienced significant hypoglycemic episodes to 40-60. Discharged on Metformin 5058monce daily. Other DM medications have been stopped. Please follow up CBG closely and titrate Metformin up if necessary. He is only to take metformin if AM BG >150. Recommend discontinuation of Lantus in future (suspect he is hypoglycemic at home).    Significant Procedures: None  Significant Labs and Imaging:  Recent Labs  Lab 11/04/19 1729 11/05/19 0132 11/06/19  0549   WBC 6.6 4.5 3.7*  HGB 11.6* 10.9* 11.8*  HCT 39.1 36.2* 39.3  PLT 339 311 283   Recent Labs  Lab 11/04/19 1729 11/04/19 1729 11/05/19 0132 11/06/19 0549  NA 131*  --  132* 137  K 4.2   < > 3.8 3.9  CL 93*  --  96* 100  CO2 25  --  26 26  GLUCOSE 176*  --  200* 71  BUN 18  --  16 13  CREATININE 1.38*  --  1.18 0.91  CALCIUM 8.6*  --  8.1* 8.2*  ALKPHOS  --   --  49 46  AST  --   --  23 26  ALT  --   --  14 12  ALBUMIN  --   --  2.9* 2.7*   < > = values in this interval not displayed.   Results/Tests Pending at Time of Discharge:   Discharge Medications:  Allergies as of 11/06/2019      Reactions   Shrimp [shellfish Allergy] Swelling   tongue      Medication List    STOP taking these medications   insulin glargine 100 unit/mL Sopn Commonly known as: LANTUS   Jardiance 25 MG Tabs tablet Generic drug: empagliflozin   Victoza 18 MG/3ML Sopn Generic drug: liraglutide     TAKE these medications   ARIPiprazole 5 MG tablet Commonly known as: Abilify Take 1 tablet (5 mg total) by mouth daily.   aspirin 81 MG EC tablet Take 1 tablet (81 mg total) by mouth daily.   carvedilol 12.5 MG tablet Commonly known as: COREG Take 1 tablet (12.5 mg total) by mouth 2 (two) times daily with a meal. What changed:   medication strength  how much to take   citalopram 20 MG tablet Commonly known as: CELEXA Take 1 tablet (20 mg total) by mouth 2 (two) times daily.   clopidogrel 75 MG tablet Commonly known as: PLAVIX Take 1 tablet (75 mg total) by mouth daily. Start taking on: November 07, 2019   Continuous Glucose Monitor Kit 1 kit by Does not apply route daily.   ferrous sulfate 325 (65 FE) MG tablet Take 1 tablet (325 mg total) by mouth daily with breakfast. Start taking on: November 07, 2019   glucose blood test strip Commonly known as: ONE TOUCH ULTRA TEST Check sugar once daily in the morning. ICD10 E11.65   HYDROcodone-acetaminophen 5-325 MG tablet Commonly  known as: NORCO/VICODIN Take 1-2 tablets by mouth every 6 (six) hours as needed for moderate pain.   isosorbide mononitrate 30 MG 24 hr tablet Commonly known as: IMDUR Take 1 tablet (30 mg total) by mouth daily.   metFORMIN 500 MG tablet Commonly known as: GLUCOPHAGE Take 1 tablet (500 mg total) by mouth daily with breakfast. What changed:   medication strength  how much to take  when to take this   multivitamin with minerals Tabs tablet Take 1 tablet by mouth daily.   nitroGLYCERIN 0.4 MG SL tablet Commonly known as: NITROSTAT Place 1 tablet (0.4 mg total) under the tongue every 5 (five) minutes as needed for chest pain.   ONE TOUCH LANCETS Misc Use to check sugar once daily. ICD10 E11.65   ONE TOUCH ULTRA 2 w/Device Kit Check sugar once daily. ICD10 E11.65   pravastatin 40 MG tablet Commonly known as: PRAVACHOL Take 1 tablet (40 mg total) by mouth every evening.   quinapril-hydrochlorothiazide 20-25 MG tablet Commonly known as: ACCURETIC Take  1 tablet by mouth daily.   traZODone 50 MG tablet Commonly known as: DESYREL Take 1 tablet (50 mg total) by mouth at bedtime as needed for sleep.   triamcinolone cream 0.1 % Commonly known as: KENALOG Apply 1 application topically 2 (two) times daily.       Discharge Instructions: Please refer to Patient Instructions section of EMR for full details.  Patient was counseled important signs and symptoms that should prompt return to medical care, changes in medications, dietary instructions, activity restrictions, and follow up appointments.   Follow-Up Appointments: Follow-up [place] Follow up on 11/08/2019.   Why: You are scheduled for a virtual visit on 3/31 at 2:25pm.  PLEASE DO NOT COME TO THE CLINIC.  Someone will contact you around your appointment time. Contact information: [place] [phone]          Lattie Haw,  MD 11/06/2019, 3:35 PM PGY-1, [place]

## 2019-11-05 NOTE — Progress Notes (Signed)
Family Medicine Teaching Service Daily Progress Note Intern Pager: [phone]  Patient name: Paul [name]. Medical record number: [id number] Date of birth: Aug 06, 1971 Age: 49 y.o. Gender: male  Primary Care Provider: Nuala Alpha, DO Consultants: None Code Status: Full  Pt Overview and Major Events to Date:  Admitted 3/27 PM  Assessment and Plan: Paul [name]. is a 49 y.o. male presenting with . PMH is significant for CAD with prior MI, cardiomyopathy with EF 40-45% in 2019, diabetes, and bipolar disorder.   Multifocal pneumonia 2/2 COVID-19 Infection: Acute, stable.  Breathing well on room air since admit.  Mild increase in CRP 5.5>6.3, however D-dimer downtrending.  Will monitor through the afternoon and if remains stable will discharge home today, will need 14-day quarantine. -Continue IV remdesivir -Linagliptin 5 mg -Continue to hold dexamethasone unless requiring oxygen -Prone as often as possible -Vitamin C and zinc supplementation -Trend fibrinogen, CRP, LDH, D-dimer -Follow-up blood cultures obtained -Oxygen supplementation if O2 under 94% consistently  Chest pain: Acute, resolved.  No recurrence since admit.  Will monitor and support for Covid infection as above.  Type II diabetes: Chronic, stable. Poor control, A1c 8.6.  Taking Metformin, lantus, and Jardiance outpatient.  No longer taking Victoza due to cost.  -Hold home Metformin and Jardiance while inpatient  -SSI with meals and at bedtime -CBGs with meals/bedtime -Lantus 10 units -Started linagliptin 5 mg for concurrent Covid as above  Ischemic cardiomyopathy, CAD with prior MI/DES to LAD: Stable. EF 40-45% in 01/2018.  Takes ASA/Plavix, Coreg, Imdur, pravastatin, and quinapril-HCTZ at home. -Hold home Imdur and quinapril-HCTZ given hypotension on arrival -Start half dose of Coreg 12.5 twice daily today -Continue home pravastatin -Continue home ASA and Plavix -Will hold off on repeat  echocardiogram given Covid positive state  Hypotension in the setting of hypertension: Resolved. SBP 60s/80s on arrival with rapid improvement following IVF, now 500-938 systolic.  Takes Coreg, Imdur, and quinapril-HCTZ at home. -Restart Coreg, half dose at 12.5 twice daily -Would restart stepwise, pending BP could hold until follow-up  BPD: Chronic, stable.  No SI or HI.  Reports well managed.  -Continue home Abilify 5 mg and Celexa 40 mg  CKD stage II: Stable.  Cr 1.38 >1.18 today, baseline around 1.08-1.2.  S/p IVF. -Monitor BMP  Pseudo-hyponatremia: Stable.  Following fluid correction, Na WNL with correction for hypoglycemia this morning.  -Monitor BMP  Microcytic, hypochromic anemia: Stable.  Hgb 11.6>10.9 this am. BL 13-14. Iron panel consistent with IDA. -Monitor CBC -Start ferrous sulfate 325 mg daily -Should have colonoscopy as outpatient, no family history of colon cancer  Hyperlipidemia: Chronic, stable. Secondary prevention.  LDL 60, at goal. -Continue home pravastatin  FEN/GI: Carb modified diet, IVF x12 hours (DC at noon today 3/28)  Prophylaxis: Lovenox  Disposition:  Likely discharge home this afternoon if remains stable  Subjective:  No acute events overnight after admission.  Doing well this morning, no shortness of breath or chest pain remaining.  Occasional cough with sputum production.  Has not needed oxygen.  Objective: Temp:  [98.3 F (36.8 C)-98.9 F (37.2 C)] 98.3 F (36.8 C) (03/28 0745) Pulse Rate:  [87-100] 87 (03/28 0745) Resp:  [14-26] 16 (03/28 0745) BP: (67-146)/(51-94) 146/93 (03/28 0745) SpO2:  [95 %-100 %] 95 % (03/28 0427) Weight:  [66.8 kg] 66.8 kg (03/28 0111) Physical Exam: General: Alert, NAD, laying on his side comfortably HEENT: NCAT, MMM Cardiac: Regular rate Lungs: Clear bilaterally with exception of coarse crackles predominantly on right base, no  increased WOB on room air satting appropriately Abdomen: soft,  non-tender Ext: Warm, dry, 2+ distal pulses, no edema   Laboratory: Recent Labs  Lab 11/04/19 1729 11/05/19 0132  WBC 6.6 4.5  HGB 11.6* 10.9*  HCT 39.1 36.2*  PLT 339 311   Recent Labs  Lab 11/04/19 1729 11/05/19 0132  NA 131* 132*  K 4.2 3.8  CL 93* 96*  CO2 25 26  BUN 18 16  CREATININE 1.38* 1.18  CALCIUM 8.6* 8.1*  PROT  --  7.6  BILITOT  --  0.7  ALKPHOS  --  49  ALT  --  14  AST  --  23  GLUCOSE 176* 200*   [address], DO 11/05/2019, 8:27 AM PGY-2,  Family Medicine FPTS Intern pager: [phone], text pages welcome

## 2019-11-06 DIAGNOSIS — E162 Hypoglycemia, unspecified: Secondary | ICD-10-CM

## 2019-11-06 LAB — COMPREHENSIVE METABOLIC PANEL
ALT: 12 U/L (ref 0–44)
AST: 26 U/L (ref 15–41)
Albumin: 2.7 g/dL — ABNORMAL LOW (ref 3.5–5.0)
Alkaline Phosphatase: 46 U/L (ref 38–126)
Anion gap: 11 (ref 5–15)
BUN: 13 mg/dL (ref 6–20)
CO2: 26 mmol/L (ref 22–32)
Calcium: 8.2 mg/dL — ABNORMAL LOW (ref 8.9–10.3)
Chloride: 100 mmol/L (ref 98–111)
Creatinine, Ser: 0.91 mg/dL (ref 0.61–1.24)
GFR calc Af Amer: >60 mL/min (ref >60)
GFR calc non Af Amer: >60 mL/min (ref >60)
Glucose, Bld: 71 mg/dL (ref 70–99)
Potassium: 3.9 mmol/L (ref 3.5–5.1)
Sodium: 137 mmol/L (ref 135–145)
Total Bilirubin: 0.6 mg/dL (ref 0.3–1.2)
Total Protein: 7.4 g/dL (ref 6.5–8.1)

## 2019-11-06 LAB — CBC WITH DIFFERENTIAL/PLATELET
Abs Immature Granulocytes: 0.02 10*3/uL (ref 0.00–0.07)
Basophils Absolute: 0 10*3/uL (ref 0.0–0.1)
Basophils Relative: 0 %
Eosinophils Absolute: 0 10*3/uL (ref 0.0–0.5)
Eosinophils Relative: 1 %
HCT: 39.3 % (ref 39.0–52.0)
Hemoglobin: 11.8 g/dL — ABNORMAL LOW (ref 13.0–17.0)
Immature Granulocytes: 1 %
Lymphocytes Relative: 29 %
Lymphs Abs: 1.1 10*3/uL (ref 0.7–4.0)
MCH: 22.3 pg — ABNORMAL LOW (ref 26.0–34.0)
MCHC: 30 g/dL (ref 30.0–36.0)
MCV: 74.2 fL — ABNORMAL LOW (ref 80.0–100.0)
Monocytes Absolute: 0.4 10*3/uL (ref 0.1–1.0)
Monocytes Relative: 12 %
Neutro Abs: 2.2 10*3/uL (ref 1.7–7.7)
Neutrophils Relative %: 57 %
Platelets: 283 10*3/uL (ref 150–400)
RBC: 5.3 MIL/uL (ref 4.22–5.81)
RDW: 19.3 % — ABNORMAL HIGH (ref 11.5–15.5)
WBC: 3.7 10*3/uL — ABNORMAL LOW (ref 4.0–10.5)
nRBC: 0 % (ref 0.0–0.2)

## 2019-11-06 LAB — GLUCOSE, CAPILLARY
Glucose-Capillary: 61 mg/dL — ABNORMAL LOW (ref 70–99)
Glucose-Capillary: 45 mg/dL — ABNORMAL LOW (ref 70–99)
Glucose-Capillary: 135 mg/dL — ABNORMAL HIGH (ref 70–99)
Glucose-Capillary: 81 mg/dL (ref 70–99)
Glucose-Capillary: 131 mg/dL — ABNORMAL HIGH (ref 70–99)
Glucose-Capillary: 60 mg/dL — ABNORMAL LOW (ref 70–99)
Glucose-Capillary: 57 mg/dL — ABNORMAL LOW (ref 70–99)
Glucose-Capillary: 72 mg/dL (ref 70–99)
Glucose-Capillary: 92 mg/dL (ref 70–99)
Glucose-Capillary: 126 mg/dL — ABNORMAL HIGH (ref 70–99)

## 2019-11-06 LAB — C-REACTIVE PROTEIN: CRP: 4.2 mg/dL — ABNORMAL HIGH (ref <1.0)

## 2019-11-06 LAB — D-DIMER, QUANTITATIVE: D-Dimer, Quant: 1.31 ug/mL-FEU — ABNORMAL HIGH (ref 0.00–0.50)

## 2019-11-06 MED ORDER — DEXTROSE 50 % IV SOLN
INTRAVENOUS | Status: AC
  Administered 2019-11-06: 50 mL
  Filled 2019-11-06: qty 50

## 2019-11-06 MED ORDER — DEXTROSE 50 % IV SOLN
INTRAVENOUS | Status: AC
  Filled 2019-11-06: qty 50

## 2019-11-06 MED ORDER — LIVING WELL WITH DIABETES BOOK
Freq: Once | Status: AC
  Administered 2019-11-06: 1
  Filled 2019-11-06: qty 1

## 2019-11-06 MED ORDER — CARVEDILOL 12.5 MG PO TABS: 12.5000 mg | ORAL_TABLET | Freq: Two times a day (BID) | ORAL | 0 refills | Status: DC

## 2019-11-06 MED ORDER — ASPIRIN 81 MG PO TBEC: 81.0000 mg | DELAYED_RELEASE_TABLET | Freq: Every day | ORAL | 0 refills | Status: DC

## 2019-11-06 MED ORDER — METFORMIN HCL 500 MG PO TABS: 500.0000 mg | ORAL_TABLET | Freq: Every day | ORAL | 0 refills | Status: DC

## 2019-11-06 MED ORDER — FERROUS SULFATE 325 (65 FE) MG PO TABS: 325.0000 mg | ORAL_TABLET | Freq: Every day | ORAL | 0 refills | Status: DC

## 2019-11-06 MED ORDER — CLOPIDOGREL BISULFATE 75 MG PO TABS: 75.0000 mg | ORAL_TABLET | Freq: Every day | ORAL | 0 refills | Status: DC

## 2019-11-06 MED FILL — CARVEDILOL 12.5 MG TABLET: 12.5 | 30 days supply | Qty: 60 | Fill #0

## 2019-11-06 MED FILL — FERROUS SULFATE 325 MG TAB: 325 (65 FE) | 30 days supply | Qty: 30 | Fill #0

## 2019-11-06 MED FILL — ASPIRIN LOW DOSE 81 MG TBEC: 81 | 90 days supply | Qty: 90 | Fill #0

## 2019-11-06 MED FILL — metFORMIN HCL 500 MG TABS: 500 | 30 days supply | Qty: 30 | Fill #0

## 2019-11-06 NOTE — Progress Notes (Signed)
SATURATION QUALIFICATIONS: (This note is used to comply with regulatory documentation for home oxygen)  Patient Saturations on Room Air at Rest = 100%  Patient Saturations on Room Air while Ambulating = 100%  Patient Saturations on ** Liters of oxygen while Ambulating = %  Please briefly explain why patient needs home oxygen: Patient is not qualified for O2, his sats stayed above 95 on room air both at rest & during ambulation.

## 2019-11-06 NOTE — Care Management (Addendum)
Pt deemed stable for discharge home today.  Pt confirms he has a PCP and denied barriers with paying for meds.  Pt will go to infusion clinic to finish remainder of IV remedesivir infusion.  CM arranged transportation services for pt.  Pt's pick up address and phone number given to transportation agency  [address] [place] [address] Best Contact Number:  [phone]  Pt informed CM that he normally uses public transportation - pt aware that he should not use public transportation while he is under isolation precautions. Pt requesting a ride home.  Pt is COVID positive - CM printed PTAR transport pkg to nurse station, bedside nurse to call report whenever pt is ready.  Pt states he has a key to get into the home, pt states he does not live alone and that he has someone that can help him or run errands for him if needed.  Pt denied [place] [address] barriers. Pt already has post follow up appt with PCP and TOC will deliver his discharge meds before pt leaves [facility].  CM signing off

## 2019-11-06 NOTE — Progress Notes (Signed)
FPTS Interim Progress Note  Patient's CBGs not well controlled.  CBG 57 after 1/2 cooke and orange juice.  Patient will remain inpatient overnight to ensure better glucose control and receive remdesivir infusion in AM.  Discharge order cancelled and remdesivir order placed.  Meribeth Vitug, Solmon Ice, DO 11/06/2019, 5:53 PM PGY-2, [facility] Family Medicine Service pager [phone]

## 2019-11-06 NOTE — Progress Notes (Signed)
@  1704: CBG level is 72, Gave pt 2 Orange juice and pt ate half of a cookie. - Dr. Towanda Octave i8nformed and she told this RN to recheck sugar after 20 mins.  @1727 : Repeat CBG is 57, pt is aymptomatic. Dextrose 50% 68ml given -Dr. 45m, [place], awaiting for response.   @1800 : D/C cancelled. Place pt. Back on tele monitor.   -D/C meds in pt.'s daily bin -Pt is eating dinner at the moment -No further interventions

## 2019-11-06 NOTE — Progress Notes (Deleted)
Patient scheduled for outpatient Remdesivir infusion at 8:30 AM on Tuesday 3/30 and Wednesday 3/31.  Please advise them to report to [facility] at [address].  Drive to the security guard and tell them you are here for an infusion. They will direct you to the front entrance where we will come and get you.  For questions call [phone].  Thanks

## 2019-11-06 NOTE — Progress Notes (Signed)
Family Medicine Teaching Service Daily Progress Note Intern Pager: [phone]  Patient name: Paul [name]. Medical record number: [id number] Date of birth: Nov 20, 1970 Age: 49 y.o. Gender: male  Primary Care Provider: Nuala Alpha, DO Consultants: None Code Status: Full  Pt Overview and Major Events to Date:  Admitted 3/27 PM  Assessment and Plan: Paul Rudd. is a 49 y.o. male presenting with . PMH is significant for CAD with prior MI, cardiomyopathy with EF 40-45% in 2019, diabetes, and bipolar disorder.   Multifocal pneumonia 2/2 COVID-19 Infection: Acute, stable.  Denies SOB or chest pain. Feels better compared to on admission.  Sats 98% on RA, T 36.9. WBC 3.7.  Mild increase in CRP 5.5>6.3>4.2. D-dimer 0.97>0.90>1.31. -Continue IV Remdesivir onemore dose  -Continue to hold dexamethasone unless requiring oxygen -Prone as often as possible -Vitamin C and zinc supplementation -Trend fibrinogen, CRP, LDH, D-dimerobtained -Oxygen supplementation if O2 under 94% consistently -Ambulate with pulse ox and d/c home today if no desats  Chest pain: Acute, resolved.  No recurrence since admit.  Will monitor and support for Covid infection as above.  Type II diabetes: Chronic, stable. CBG 61> 45>135. Denies hypoglycemic symptoms and was eating well. A1c 8.6. Taking Metformin, lantus, and Jardiance outpatient.  No longer taking Victoza due to cost.  -Hold home Metformin and Jardiance while inpatient  -Stop SSI with meals and at bedtime, linagliptin 5 mg Lantus 10 units as patient had significant hypoglycemia this morning requiring D50.  -CBGs with meals/bedtime -Consider Metformin on discharge and stopping home diabetic medications   Ischemic cardiomyopathy, CAD with prior MI/DES to LAD: Stable. EF 40-45% in 01/2018.  Takes ASA/Plavix, Coreg, Imdur, pravastatin, and quinapril-HCTZ at home. -Hold home Imdur and quinapril-HCTZ given hypotension on arrival -Start half  dose of Coreg 12.5 twice daily today -Continue home pravastatin -Continue home ASA and Plavix -Will hold off on repeat echocardiogram given Covid positive state  Hypotension in the setting of hypertension: Resolved. Bps 121/83 Takes Coreg, Imdur, and quinapril-HCTZ at home. -Restart Coreg, half dose at 12.5 twice daily -Would restart stepwise, pending BP could hold until follow-up  BPD: Chronic, stable.  No SI or HI.  Reports well managed.  -Continue home Abilify 5 mg and Celexa 40 mg  CKD stage II: Stable.  Cr 1.38 >1.18>0.91 today, baseline around 1.08-1.2.  S/p IVF. -Monitor BMP  Pseudo-hyponatremia: Stable.  Following fluid correction, Na WNL with correction for hypoglycemia this morning.  -Monitor BMP  Microcytic, hypochromic anemia: Stable.  Hgb 11.6>10.9>11.8. BL 13-14. Iron panel consistent with IDA. -Monitor CBC -Start ferrous sulfate 325 mg daily -Should have colonoscopy as outpatient, no family history of colon cancer  Hyperlipidemia: Chronic, stable. Secondary prevention.  LDL 60, at goal. -Continue home pravastatin  FEN/GI: Carb modified diet, IVF x12 hours (DC at noon today 3/28)  Prophylaxis: Lovenox  Disposition:  Likely discharge home this afternoon if remains stable  Subjective:  Denies SOB or chest pain. Denies hypoglycemic symptoms but thinks we are giving him too much Lantus as his sugars are dropping too low.  Objective: Temp:  [98.2 F (36.8 C)-99.3 F (37.4 C)] 98.4 F (36.9 C) (03/29 0443) Pulse Rate:  [79-97] 80 (03/29 0443) Resp:  [18] 18 (03/29 0443) BP: (115-131)/(83-94) 121/83 (03/29 0443) SpO2:  [94 %-99 %] 99 % (03/29 0443)   Physical Exam: General: Alert, pleasant, normal WOB Cardio: Normal S1 and S2, RRR. Pulm: CTAB, normal WOB, not requiring oxygen  Abdomen: Bowel sounds normal. Abdomen soft and non-tender.  Extremities:  No peripheral edema. Warm/ well perfused.  Strong radial pulse  Neuro: Cranial nerves grossly  intact  Laboratory: Recent Labs  Lab 11/04/19 1729 11/05/19 0132 11/06/19 0549  WBC 6.6 4.5 3.7*  HGB 11.6* 10.9* 11.8*  HCT 39.1 36.2* 39.3  PLT 339 311 283   Recent Labs  Lab 11/04/19 1729 11/05/19 0132 11/06/19 0549  NA 131* 132* 137  K 4.2 3.8 3.9  CL 93* 96* 100  CO2 25 26 26   BUN 18 16 13   CREATININE 1.38* 1.18 0.91  CALCIUM 8.6* 8.1* 8.2*  PROT  --  7.6 7.4  BILITOT  --  0.7 0.6  ALKPHOS  --  49 46  ALT  --  14 12  AST  --  23 26  GLUCOSE 176* 200* 71   , MD 11/06/2019, 9:48 AM PGY-1, [facility] FPTS Intern pager: [phone], text pages welcome

## 2019-11-06 NOTE — Discharge Instructions (Addendum)
You are scheduled for an outpatient infusion of Remdesivir at  9:00 AM on Wednesday 3/31.Marland Kitchen  Please report to [facility] at [address].  Drive to the security guard and tell them you are here for an infusion. They will direct you to the front entrance where we will come and get you.  For questions call [phone].  Thanks   Mr Paul [name], Paul [name] were admitted to hospital with COVID 19. You did very well during your hospital stay and were given Remdesivir which is a medication used to treat COVID, you will continue this on discharge for 2 more doses at a separate site. You never required oxygen which is great! Your blood sugars dropped quite low in hospital due to the insulin that you were given. On discharge you should not take any diabetes medications.  When your sugars are >150, you can restart your metformin IN THE MORNING.  You will have a follow up with your PCP who will help managed your diabetes as outpatient.   You have a virtual follow up at [facility] on 11/08/2019 at 2:25pm. Please do not go to this appointment. Someone will contact you.   Best wishes and take care,  Dr Allena Katz

## 2019-11-07 ENCOUNTER — Ambulatory Visit (HOSPITAL_COMMUNITY): Payer: Medicare Other

## 2019-11-07 DIAGNOSIS — E118 Type 2 diabetes mellitus with unspecified complications: Secondary | ICD-10-CM

## 2019-11-07 LAB — BASIC METABOLIC PANEL
Anion gap: 9 (ref 5–15)
BUN: 14 mg/dL (ref 6–20)
CO2: 26 mmol/L (ref 22–32)
Calcium: 8.3 mg/dL — ABNORMAL LOW (ref 8.9–10.3)
Chloride: 103 mmol/L (ref 98–111)
Creatinine, Ser: 0.86 mg/dL (ref 0.61–1.24)
GFR calc Af Amer: >60 mL/min (ref >60)
GFR calc non Af Amer: >60 mL/min (ref >60)
Glucose, Bld: 105 mg/dL — ABNORMAL HIGH (ref 70–99)
Potassium: 3.8 mmol/L (ref 3.5–5.1)
Sodium: 138 mmol/L (ref 135–145)

## 2019-11-07 LAB — CBC
HCT: 38.8 % — ABNORMAL LOW (ref 39.0–52.0)
Hemoglobin: 11.5 g/dL — ABNORMAL LOW (ref 13.0–17.0)
MCH: 22 pg — ABNORMAL LOW (ref 26.0–34.0)
MCHC: 29.6 g/dL — ABNORMAL LOW (ref 30.0–36.0)
MCV: 74.3 fL — ABNORMAL LOW (ref 80.0–100.0)
Platelets: 331 10*3/uL (ref 150–400)
RBC: 5.22 MIL/uL (ref 4.22–5.81)
RDW: 19 % — ABNORMAL HIGH (ref 11.5–15.5)
WBC: 3.5 10*3/uL — ABNORMAL LOW (ref 4.0–10.5)
nRBC: 0 % (ref 0.0–0.2)

## 2019-11-07 LAB — GLUCOSE, CAPILLARY
Glucose-Capillary: 109 mg/dL — ABNORMAL HIGH (ref 70–99)
Glucose-Capillary: 144 mg/dL — ABNORMAL HIGH (ref 70–99)

## 2019-11-07 NOTE — Progress Notes (Signed)
Patient scheduled for outpatient Remdesivir infusion at 9:00 AM on Wednesday 3/31.  Please advise them to report to [facility] at [address]  Drive to the security guard and tell them you are here for an infusion. They will direct you to the front entrance where we will come and get you.  For questions call [phone].  Thanks

## 2019-11-07 NOTE — Discharge Summary (Signed)
[facility] Discharge Summary  Patient name: Paul [name]. Medical record number: [id number] Date of birth: Jul 24, 1971 Age: 49 y.o. Gender: male Date of Admission: 11/04/2019  Date of Discharge: 11/07/19 Admitting Physician: Martyn Malay, MD  Primary Care Provider: Nuala Alpha, DO Consultants: None Indication for Hospitalization: COVID  Discharge Diagnoses/Problem List:  COVID Diabetes Ischemic cardiomyopathy Coronary artery disease with prior MI Bipolar disorder Chronic kidney disease stage II Hyponatremia Microcytic hypochromic anemia  Hyperlipidemia  Disposition: Home  Discharge Condition: Likely stable for discharge  Discharge Exam:   General: Alert and cooperative and appears to be in no acute distress Cardio: Normal S1 and S2, RRR. Pulm: CTAB, normal WOB Abdomen: Bowel sounds normal. Abdomen soft and non-tender.  Extremities: No peripheral edema. Warm/ well perfused.  Strong radial pulse. Neuro: Cranial nerves grossly intact  Brief Hospital Course:  Paul [name]. is a 49 y.o. male presenting with . PMH is significant for CAD with prior MI, cardiomyopathy with EF 40-45% in 2019, diabetes, and bipolar disorder.  His hospital course is outlined below.  COVID Pneumonia Admission details can be found in H&P.  Patient initially hypotensive, and improved with IVF.  CXR showed no active disease.  D-dimer was elevated to 0.9 and patient had reported hemoptysis, therefore CTA was performed which showed multifocal consolidations, greatest at right lung base.  His COVID test returned positive. He was started on remdesivir only, as his respiratory status remained stable and he did not require oxygen throughout his admission. Throughout his hospitalization, he continued to improve and was discharged home on 3/30 to complete 1 additional day of remdesivir as an outpatient.    Hypoglycemia Patient has a history of diabetes and  presented with home medications listed as Lantus 25u, jardiance, and metformin.  During his hospitalization, his sugars were monitored while he was given sliding scale insulin and lantus 10u.  He was intermittently hypoglycemic and his medications were adjusted as needed.  At the time of discharge his CBGs were stable and he was discharged with instructions to NOT continue any diabetes medications.  He was advised that when his CBGs are >150, he can restart Metformin QAM.  Issues for Follow Up:  1. Patient has been scheduled for remdesivir infusion x1 on 3/31 and transportation was provided. 2. Patient was advised to not take any diabetes medications on discharge until his CBGs are >150, then he can resume metformin QAM.  Please follow up on his CBGs. 3. Ensure improvement of symptoms. 4. Patient was discharged with 90 day supply of ASA and plavix given significant CAD.  Ensure continuation. 5. Patient with microcytic hypochromic anemia during admission.  He was started on ferrous sulfate 31m QD.  Hgb on discharged was 11.5.  Recommend outpatient colonoscopy. 6. Patient with hypotension in the setting of COVID.  Coreg was restarted at 12.524mBID (half his home dose).  Other home BP meds were restarted prior to discharge.  Please increase Coreg as needed/tolerated.  Significant Procedures: none  Significant Labs and Imaging:  Recent Labs  Lab 11/05/19 0132 11/06/19 0549 11/07/19 0233  WBC 4.5 3.7* 3.5*  HGB 10.9* 11.8* 11.5*  HCT 36.2* 39.3 38.8*  PLT 311 283 331   Recent Labs  Lab 11/04/19 1729 11/04/19 1729 11/05/19 0132 11/05/19 0132 11/06/19 0549 11/07/19 0233  NA 131*  --  132*  --  137 138  K 4.2   < > 3.8   < > 3.9 3.8  CL 93*  --  96*  --  100 103  CO2 25  --  26  --  26 26  GLUCOSE 176*  --  200*  --  71 105*  BUN 18  --  16  --  13 14  CREATININE 1.38*  --  1.18  --  0.91 0.86  CALCIUM 8.6*  --  8.1*  --  8.2* 8.3*  ALKPHOS  --   --  49  --  46  --   AST  --   --   23  --  26  --   ALT  --   --  14  --  12  --   ALBUMIN  --   --  2.9*  --  2.7*  --    < > = values in this interval not displayed.      Results/Tests Pending at Time of Discharge:    Discharge Medications:  Allergies as of 11/07/2019      Reactions   Shrimp [shellfish Allergy] Swelling   tongue      Medication List    STOP taking these medications   insulin glargine 100 unit/mL Sopn Commonly known as: LANTUS   Jardiance 25 MG Tabs tablet Generic drug: empagliflozin   Victoza 18 MG/3ML Sopn Generic drug: liraglutide     TAKE these medications   ARIPiprazole 5 MG tablet Commonly known as: Abilify Take 1 tablet (5 mg total) by mouth daily.   aspirin 81 MG EC tablet Take 1 tablet (81 mg total) by mouth daily.   carvedilol 12.5 MG tablet Commonly known as: COREG Take 1 tablet (12.5 mg total) by mouth 2 (two) times daily with a meal. What changed:   medication strength  how much to take   citalopram 20 MG tablet Commonly known as: CELEXA Take 1 tablet (20 mg total) by mouth 2 (two) times daily.   clopidogrel 75 MG tablet Commonly known as: PLAVIX Take 1 tablet (75 mg total) by mouth daily.   Continuous Glucose Monitor Kit 1 kit by Does not apply route daily.   ferrous sulfate 325 (65 FE) MG tablet Take 1 tablet (325 mg total) by mouth daily with breakfast.   glucose blood test strip Commonly known as: ONE TOUCH ULTRA TEST Check sugar once daily in the morning. ICD10 E11.65   HYDROcodone-acetaminophen 5-325 MG tablet Commonly known as: NORCO/VICODIN Take 1-2 tablets by mouth every 6 (six) hours as needed for moderate pain.   isosorbide mononitrate 30 MG 24 hr tablet Commonly known as: IMDUR Take 1 tablet (30 mg total) by mouth daily.   metFORMIN 500 MG tablet Commonly known as: GLUCOPHAGE Take 1 tablet (500 mg total) by mouth daily with breakfast. What changed:   medication strength  how much to take  when to take this   multivitamin with  minerals Tabs tablet Take 1 tablet by mouth daily.   nitroGLYCERIN 0.4 MG SL tablet Commonly known as: NITROSTAT Place 1 tablet (0.4 mg total) under the tongue every 5 (five) minutes as needed for chest pain.   ONE TOUCH LANCETS Misc Use to check sugar once daily. ICD10 E11.65   ONE TOUCH ULTRA 2 w/Device Kit Check sugar once daily. ICD10 E11.65   pravastatin 40 MG tablet Commonly known as: PRAVACHOL Take 1 tablet (40 mg total) by mouth every evening.   quinapril-hydrochlorothiazide 20-25 MG tablet Commonly known as: ACCURETIC Take 1 tablet by mouth daily.   traZODone 50 MG tablet Commonly known as: DESYREL Take 1 tablet (  50 mg total) by mouth at bedtime as needed for sleep.   triamcinolone cream 0.1 % Commonly known as: KENALOG Apply 1 application topically 2 (two) times daily.       Discharge Instructions: Please refer to Patient Instructions section of EMR for full details.  Patient was counseled important signs and symptoms that should prompt return to medical care, changes in medications, dietary instructions, activity restrictions, and follow up appointments.   Follow-Up Appointments: Follow-up [facility] Follow up on 11/08/2019.   Why: You are scheduled for a virtual visit on 3/31 at 2:25pm.  PLEASE DO NOT COME TO THE CLINIC.  Someone will contact you around your appointment time. Contact information: [place] [phone]          Lattie Haw, MD 11/08/2019, 9:51 AM PGY-1, [facility]

## 2019-11-07 NOTE — Plan of Care (Signed)
  Problem: Clinical Measurements: Goal: Respiratory complications will improve Outcome: Progressing   

## 2019-11-07 NOTE — TOC Transition Note (Signed)
Transition of Care [facility]) - CM/SW Discharge Note   Patient Details  Name: Paul [name]. MRN: [id number] Date of Birth: 1970-10-09  Transition of Care [facility]) CM/SW Contact:  Cherylann Parr, RN Phone Number: 11/07/2019, 12:03 PM   Clinical Narrative:   Pt's discharge for yesterday was cancelled due to low blood sugar.  CM informed by transportation services that address given yesterday was incorrect.  Agency went to pick pt up this am and was unable to locate home -  [address] neighbors called police on [address].  CM informed pt of event and requested verification of home address. Pt now telling CM address is [address]   CM asked pt to verify the new address was in fact correct - pt became frustrated and told CM to call ex wife.   Per ex wife - pt lives with a roomate  Ex wife confirmed address with pt as : [address] [place] [address]   CM informed transportation agency of pt's correct address.   CM informed agency that pt's appt tomorrow is now at 9am.  CM reprinted med Ar[facility] sheet with correct address to nurse station required for transport by [facility] to pts home.  Bedside nurse to retrieve updated sheet and discard the one with the incorrect address - nurse to call [facility] for transport home when pt is ready and discharge order is written.  No other CM needs determined - CM signing off        Patient Goals and CMS Choice        Discharge Placement                       Discharge Plan and Services                                     Social Determinants of Health (SDOH) Interventions     Readmission Risk Interventions No flowsheet data found.

## 2019-11-07 NOTE — Progress Notes (Signed)
Family Medicine Teaching Service Daily Progress Note Intern Pager: [phone]  Patient name: Paul [name]. Medical record number: [id number] Date of birth: September 01, 1970 Age: 49 y.o. Gender: male  Primary Care Provider: Nuala Alpha, DO Consultants: None Code Status: Full  Pt Overview and Major Events to Date:  Admitted 3/27 PM  Assessment and Plan: Paul Mckoy. is a 49 y.o. male presenting with . PMH is significant for CAD with prior MI, cardiomyopathy with EF 40-45% in 2019, diabetes, and bipolar disorder.   Multifocal pneumonia 2/2 COVID-19 Infection: Acute, stable.  Doing well, denies SOB or chest pain. Has on going cough.Feels he is ready to go home.  Sats 100% on RA, Afebrile, WBC 3.5. CRP 5.5>6.3>4.2. D-dimer 0.97>0.90>1.31. -Continue IV Remdesivir today and one more dose tomorrow after discharge -Continue to hold dexamethasone unless requiring oxygen -Prone as often as possible -Vitamin C and zinc supplementation -Oxygen supplementation if O2 under 94% consistently -Ambulate with pulse ox and d/c home today if no desats -Discharge today   Chest pain: Acute, resolved.  No recurrence since admit.  Will monitor and support for Covid infection as above.  Type II diabetes: Chronic, stable. Denies hypoglycemic symptoms today. Has eaten a large breakfast.  CBG 57>92>126>109.A1c 8.6.  Significant hypoglycemic episodes yesterday requiring D50 at least twice which prevented discharge home. Sliding scale, Lantus and Linagliptin was stopped. Taking Metformin, lantus, and Jardiance outpatient.  No longer taking Victoza due to cost.  -Continue to monitor CBGs not on diabetic medications -Metformin 500mg  XR on discharge   Ischemic cardiomyopathy, CAD with prior MI/DES to LAD: Stable. EF 40-45% in 01/2018.  Takes ASA/Plavix, Coreg, Imdur, pravastatin, and quinapril-HCTZ at home. -Hold home Imdur and quinapril-HCTZ given hypotension on arrival -Start half dose of  Coreg 12.5 twice daily today -Continue home pravastatin -Continue home ASA and Plavix  Hypotension in the setting of hypertension: Resolved. Bps 149/98 Takes Coreg, Imdur, and quinapril-HCTZ at home. -Restart Coreg, half dose at 12.5 twice daily -Would restart stepwise, pending BP could hold until follow-up  BPD: Chronic, stable.  No SI or HI.  Reports well managed.  -Continue home Abilify 5 mg and Celexa 40 mg  CKD stage II: Stable.  Cr 1.38 >1.18>0.91>0.86 today, baseline around 1.08-1.2.  S/p IVF. -Monitor BMP  Pseudo-hyponatremia: Stable.  Following fluid correction, Na WNL with correction for hypoglycemia this morning.  -Monitor BMP  Microcytic, hypochromic anemia: Stable.  Hgb 11.6>10.9>11.8>11.5. BL 13-14. Iron panel consistent with IDA. -Monitor CBC -Start ferrous sulfate 325 mg daily -Should have colonoscopy as outpatient, no family history of colon cancer  Hyperlipidemia: Chronic, stable. Secondary prevention.  LDL 60, at goal. -Continue home pravastatin  FEN/GI: Carb modified diet, IVF x12 hours (DC at noon today 3/28)  Prophylaxis: Lovenox  Disposition:  Likely discharge home this afternoon if remains stable  Subjective:  Doing well, no concerns. Feels he is ready to go home. I explained that we kept him in hospital yesterday due to his CBGs being very low. Explained that he should only continue the Metformin on discharge.  Objective: Temp:  [98.3 F (36.8 C)-99.1 F (37.3 C)] 98.3 F (36.8 C) (03/30 0407) Pulse Rate:  [73-95] 95 (03/30 0407) Resp:  [18-20] 18 (03/30 0407) BP: (126-149)/(79-98) 149/98 (03/30 0407) SpO2:  [98 %-100 %] 99 % (03/30 0407)   Physical Exam:  General: Alert and cooperative and appears to be in no acute distress Cardio: Normal S1 and S2, RRR. Pulm: CTAB, normal WOB Abdomen: Bowel sounds normal. Abdomen soft and  non-tender.  Extremities: No peripheral edema. Warm/ well perfused.  Strong radial pulse. Neuro: Cranial  nerves grossly intact  Laboratory: Recent Labs  Lab 11/05/19 0132 11/06/19 0549 11/07/19 0233  WBC 4.5 3.7* 3.5*  HGB 10.9* 11.8* 11.5*  HCT 36.2* 39.3 38.8*  PLT 311 283 331   Recent Labs  Lab 11/05/19 0132 11/06/19 0549 11/07/19 0233  NA 132* 137 138  K 3.8 3.9 3.8  CL 96* 100 103  CO2 26 26 26   BUN 16 13 14   CREATININE 1.18 0.91 0.86  CALCIUM 8.1* 8.2* 8.3*  PROT 7.6 7.4  --   BILITOT 0.7 0.6  --   ALKPHOS 49 46  --   ALT 14 12  --   AST 23 26  --   GLUCOSE 200* 71 105*   , MD 11/07/2019, 7:42 AM PGY-1, [facility] Family Medicine FPTS Intern pager: [phone], text pages welcome

## 2019-11-07 NOTE — Progress Notes (Signed)
VAST RN attempted to call pt's nurse, Rasheen, regarding pt's need for IV access. Left message for RN to return my call as it appears pt might be discharged today.

## 2019-11-08 ENCOUNTER — Ambulatory Visit (HOSPITAL_COMMUNITY)
Admission: RE | Admit: 2019-11-08 | Discharge: 2019-11-08 | Disposition: A | Discharge status: Home / Self Care | Payer: Medicare Other | Source: Ambulatory Visit | Attending: Pulmonary Disease | Admitting: Pulmonary Disease

## 2019-11-08 ENCOUNTER — Ambulatory Visit (HOSPITAL_COMMUNITY): Payer: Medicare Other

## 2019-11-08 ENCOUNTER — Telehealth (INDEPENDENT_AMBULATORY_CARE_PROVIDER_SITE_OTHER): Payer: Medicare Other | Admitting: Family Medicine

## 2019-11-08 ENCOUNTER — Other Ambulatory Visit: Payer: Self-pay

## 2019-11-08 DIAGNOSIS — Z7982 Long term (current) use of aspirin: Secondary | ICD-10-CM | POA: Diagnosis not present

## 2019-11-08 DIAGNOSIS — Z09 Encounter for follow-up examination after completed treatment for conditions other than malignant neoplasm: Secondary | ICD-10-CM | POA: Insufficient documentation

## 2019-11-08 DIAGNOSIS — J1282 Pneumonia due to coronavirus disease 2019: Secondary | ICD-10-CM

## 2019-11-08 DIAGNOSIS — I255 Ischemic cardiomyopathy: Secondary | ICD-10-CM | POA: Diagnosis not present

## 2019-11-08 DIAGNOSIS — I10 Essential (primary) hypertension: Secondary | ICD-10-CM | POA: Diagnosis not present

## 2019-11-08 DIAGNOSIS — U071 COVID-19: Secondary | ICD-10-CM

## 2019-11-08 DIAGNOSIS — Z20822 Contact with and (suspected) exposure to covid-19: Secondary | ICD-10-CM | POA: Diagnosis not present

## 2019-11-08 DIAGNOSIS — E11649 Type 2 diabetes mellitus with hypoglycemia without coma: Secondary | ICD-10-CM

## 2019-11-08 MED ORDER — FAMOTIDINE IN NACL 20-0.9 MG/50ML-% IV SOLN: 20.0000 mg | Freq: Once | INTRAVENOUS | Status: DC | PRN

## 2019-11-08 MED ORDER — ALBUTEROL SULFATE HFA 108 (90 BASE) MCG/ACT IN AERS: 2.0000 | INHALATION_SPRAY | Freq: Once | RESPIRATORY_TRACT | Status: DC | PRN

## 2019-11-08 MED ORDER — SODIUM CHLORIDE 0.9 % IV SOLN: INTRAVENOUS | Status: DC | PRN

## 2019-11-08 MED ORDER — DIPHENHYDRAMINE HCL 50 MG/ML IJ SOLN: 50.0000 mg | Freq: Once | INTRAMUSCULAR | Status: DC | PRN

## 2019-11-08 MED ORDER — EPINEPHRINE 0.3 MG/0.3ML IJ SOAJ: 0.3000 mg | Freq: Once | INTRAMUSCULAR | Status: DC | PRN

## 2019-11-08 MED ORDER — METHYLPREDNISOLONE SODIUM SUCC 125 MG IJ SOLR: 125.0000 mg | Freq: Once | INTRAMUSCULAR | Status: DC | PRN

## 2019-11-08 MED ORDER — SODIUM CHLORIDE 0.9 % IV SOLN
100.0000 mg | Freq: Once | INTRAVENOUS | Status: AC
  Administered 2019-11-08: 100 mg via INTRAVENOUS
  Filled 2019-11-08: qty 20

## 2019-11-08 NOTE — Progress Notes (Addendum)
Disregard previous discharge note d/ t typo error. Patient received Remdesivir this morning. Diagnosis: COVID-19  Physician:dr wright  Procedure: [facility] Med: remdesivir infusion.  Complications: No immediate complications noted.  Discharge: Discharged home   Shaune Spittle 11/08/2019

## 2019-11-08 NOTE — Assessment & Plan Note (Signed)
CBGs 130-140 -Continue metformin -Consider adding/discussing GLP-1/SGLT2 therapy at subsequent appointment

## 2019-11-08 NOTE — Assessment & Plan Note (Signed)
-  Keep daily BP log for 2 weeks -continue imdur and coreg at current doses -Follow up in 2 weeks for BP check and medication adjustment if necessary

## 2019-11-08 NOTE — Progress Notes (Signed)
[facility] Telemedicine Visit  Patient consented to have virtual visit. Method of visit: Video  Encounter participants: Patient: Paul [name]. - located at home Provider: Lavonda Jumbo - located at [facility] Others (if applicable): N/A  Chief Complaint: Hospital follow up  HPI: Paul [name] is a 49 yo male presenting via video for a hospital follow up due to COVID. He is doing ok. His only symptoms are cough and congestion. He does not endorse fever, shortness of breath, or loss of taste/smell. He finished his last remdesivir injection today. He endorses compliance with aspirin and plavix. His blood sugars has been above 150 and he has resumed taking metformin. His CBGs average 140 and lows to 130s. He is not taking insulin. He continues to take imdur and coreg 25mg  and BP today is 139/89 with a home wrist cuff. He does not endorse headache, vision changes, chest pain or arm pain.   ROS: per HPI  Pertinent PMHx: MI, HTN, CAD, T2DM, HLD, anxiety, PTSD  Exam:  BP Lt. Arm 139/89 Respiratory: Speaking in full sentences. No nasal flaring.   Assessment/Plan:  Pneumonia due to COVID-19 virus Improving. Remdesivir course completed. Continues to have cough/congestion.  -Continue ambulation -Continue quarentine for additional 14 days beyond hospitalization (end 4/10) -Follow up as needed or if symptoms worsen  Hypertension -Keep daily BP log for 2 weeks -continue imdur and coreg at current doses -Follow up in 2 weeks for BP check and medication adjustment if necessary  Diabetes mellitus type 2, uncontrolled CBGs 130-140 -Continue metformin -Consider adding/discussing GLP-1/SGLT2 therapy at subsequent appointment  Hospital discharge follow-up Stable. Continuing to quarentine until 11/18/2019. Attempted to discuss signing up for the COVID vaccine; anxiety surrounding conversation. Will table for now.  -Follow up with in person visit in 2 weeks:  -BP log  and recheck for medication adjustment if needed   -Needs repeat CBC, taking iron; Pending results needs referral for colonoscopy  -Discuss COVID vaccine options  -GAD 7     Time spent during visit with patient:  20 minutes

## 2019-11-08 NOTE — Assessment & Plan Note (Addendum)
Stable. Continuing to quarentine until 11/18/2019. Attempted to discuss signing up for the COVID vaccine; anxiety surrounding conversation. Will table for now.  -Follow up with in person visit in 2 weeks:  -BP log and recheck for medication adjustment if needed   -Needs repeat CBC, taking iron; Pending results needs referral for colonoscopy  -Discuss COVID vaccine options  -GAD 7

## 2019-11-08 NOTE — Assessment & Plan Note (Addendum)
Improving. Remdesivir course completed. Continues to have cough/congestion.  -Continue ambulation -Continue quarentine for additional 14 days beyond hospitalization (end 4/10) -Follow up as needed or if symptoms worsen

## 2019-11-08 NOTE — Progress Notes (Signed)
  Diagnosis: COVID-19  Physician: wright  Procedure: [facility] Infusion Clinic Med: bamlanivimab infusion - Provided patient with bamlanimivab fact sheet for patients, parents and caregivers prior to infusion.  Complications: No immediate complications noted.  Discharge: Discharged home   Amjad Fikes R 11/08/2019   

## 2019-11-08 NOTE — Discharge Instructions (Signed)
covid

## 2019-11-09 LAB — CULTURE, BLOOD (ROUTINE X 2)
Culture: NO GROWTH
Culture: NO GROWTH
Special Requests: ADEQUATE
Special Requests: ADEQUATE

## 2019-12-01 ENCOUNTER — Ambulatory Visit (INDEPENDENT_AMBULATORY_CARE_PROVIDER_SITE_OTHER): Payer: Medicare Other | Admitting: Family Medicine

## 2019-12-01 ENCOUNTER — Encounter: Payer: Self-pay | Admitting: Family Medicine

## 2019-12-01 ENCOUNTER — Other Ambulatory Visit: Payer: Self-pay

## 2019-12-01 DIAGNOSIS — E1165 Type 2 diabetes mellitus with hyperglycemia: Secondary | ICD-10-CM

## 2019-12-01 DIAGNOSIS — I255 Ischemic cardiomyopathy: Secondary | ICD-10-CM | POA: Diagnosis not present

## 2019-12-01 MED ORDER — INSULIN GLARGINE 100 UNIT/ML ~~LOC~~ SOLN: 20.0000 [IU] | Freq: Every day | SUBCUTANEOUS | 11 refills | Status: DC

## 2019-12-01 MED ORDER — EMPAGLIFLOZIN 25 MG PO TABS: 25.0000 mg | ORAL_TABLET | Freq: Every day | ORAL | 3 refills | Status: AC

## 2019-12-01 MED ORDER — METFORMIN HCL 500 MG PO TABS: 500.0000 mg | ORAL_TABLET | Freq: Two times a day (BID) | ORAL | 0 refills | Status: DC

## 2019-12-01 MED ORDER — METFORMIN HCL 500 MG PO TABS: 500.0000 mg | ORAL_TABLET | Freq: Every day | ORAL | 0 refills | Status: DC

## 2019-12-01 NOTE — Progress Notes (Signed)
    SUBJECTIVE:   CHIEF COMPLAINT / HPI:   Follow up after COVID Patient presents today for a follow up after testing positive for COVID. He was hospitalized for 3 days about a month ago. He states his breathing continues to improve but is still not quite back to what it was before. He is not throwing up blood. He is walking up to 2 miles per day without developing shortness of breath.  No dizziness, syncope, or near syncope.   T2DM Not well controlled. Patient temporarily stopped taking jardiance during COVID infection and would like to restart. He is currently taking Metformin 500mg  BID and Lantus 25U QHS. No hypoglycemic events, tolerating medication well. I did want to increase his metformin to the max dose but patient wants to restart jardiance first and then see where his blood sugar is at. He is not checking it at home. Encouraged patient to check his blood sugar at home especially before giving himself Lantus. Patient understood he must check it before giving himself insulin. He does have the meter, strips, and lancets.  PERTINENT  PMH / PSH: HTN, CAD, T2DM, HLD  OBJECTIVE:   BP 124/80   Pulse 74   Wt 156 lb (70.8 kg)   SpO2 91%   BMI 21.76 kg/m   Gen: NAD CV: RRR, no murmurs, normal S1, S2 split Resp: CTAB, no wheezing, rales, or rhonchi, comfortable work of breathing Ext: no clubbing, cyanosis, or edema Skin: warm, dry, intact, no rashes  ASSESSMENT/PLAN:   Diabetes mellitus type 2, uncontrolled Not well controlled with last A1c at 8.6%. - Cont metformin at current dose; would like to ultimately increase it to 1000mg  BID but will hold off for now since I am adding back Jardiance today - Start back Jardiance at 25mg  daily since he was on this before and only temporarily stopped it - cont Lantus 25U QHS, and emphasized he MUST check his blood glucose before giving Lantus     , DO [facility]

## 2019-12-01 NOTE — Patient Instructions (Signed)
It was great to see you today! Thank you for letting me participate in your care!  Today, we discussed controlling your diabetes better. I have restarted your Jardiance at 25mg . Please take it as prescribed. I will see you in one month to see how you are doing.  Be well, , DO PGY-3, [facility]

## 2019-12-04 NOTE — Assessment & Plan Note (Signed)
Not well controlled with last A1c at 8.6%. - Cont metformin at current dose; would like to ultimately increase it to 1000mg  BID but will hold off for now since I am adding back Jardiance today - Start back Jardiance at 25mg  daily since he was on this before and only temporarily stopped it - cont Lantus 25U QHS, and emphasized he MUST check his blood glucose before giving Lantus

## 2019-12-21 ENCOUNTER — Other Ambulatory Visit: Payer: Self-pay | Admitting: Family Medicine

## 2019-12-29 ENCOUNTER — Other Ambulatory Visit: Payer: Self-pay

## 2019-12-29 ENCOUNTER — Ambulatory Visit: Payer: Medicare Other | Admitting: Family Medicine

## 2019-12-29 ENCOUNTER — Ambulatory Visit (INDEPENDENT_AMBULATORY_CARE_PROVIDER_SITE_OTHER): Payer: Medicare Other | Admitting: Family Medicine

## 2019-12-29 ENCOUNTER — Ambulatory Visit (HOSPITAL_COMMUNITY)
Admission: RE | Admit: 2019-12-29 | Discharge: 2019-12-29 | Disposition: A | Discharge status: Home / Self Care | Payer: Medicare Other | Source: Ambulatory Visit | Attending: Family Medicine | Admitting: Family Medicine

## 2019-12-29 VITALS — BP 110/82 | Ht 71.0 in | Wt 154.8 lb

## 2019-12-29 DIAGNOSIS — E1165 Type 2 diabetes mellitus with hyperglycemia: Secondary | ICD-10-CM

## 2019-12-29 DIAGNOSIS — R0602 Shortness of breath: Secondary | ICD-10-CM | POA: Insufficient documentation

## 2019-12-29 MED ORDER — ATORVASTATIN CALCIUM 20 MG PO TABS: 20.0000 mg | ORAL_TABLET | Freq: Every day | ORAL | 3 refills | Status: DC

## 2019-12-29 MED ORDER — CLOPIDOGREL BISULFATE 75 MG PO TABS: 75.0000 mg | ORAL_TABLET | Freq: Every day | ORAL | 0 refills | Status: DC

## 2019-12-29 NOTE — Patient Instructions (Addendum)
It was great to see you today! Thank you for letting me participate in your care!  Today, we discussed your nausea and vomiting and the fact that you think it is due to pravastatin. I have stopped this medication and will start you on a different statin to see if you can tolerate it better. Please take it as prescribed. It is important for you to keep your cholesterol low due to your history of heart disease.  I will continue to encourage you to check your blood sugar at home. It is really important to do so especially before you give yourself insulin. I am glad you are tolerating your Jardiance, please continue to take it as prescribed.  I will reach out to your Cardiologist Dr. Kirtland Bouchard to discuss whether or not you should still be on Plavix.  I am getting a chest x-ray since you continue to have shortness of breath after having COVID-19.  Be well, [facility]ules Schick, DO PGY-3, [facility]

## 2019-12-29 NOTE — Progress Notes (Signed)
    SUBJECTIVE:   CHIEF COMPLAINT / HPI:   T2DM Patient had been temporarily off jardiance and then it was restarted. His A1c was not well controlled so I recently increased the dose from 10 to 25mg . He is tolerating it well, no side effects. He had no difficulty obtaining or taking the medication.  Post COVID Patient still has shortness of breath compared to before he got COVID. Overall, he can now walk about 1 mile before he begins to get winded which is much better than it was even several weeks ago. However, given his heart history I have a low threshold for imaging. He denies swelling in his legs, chest pain, SOB. He is curious when his breathing will be "back to normal".   HLD Patient stopped taking pravastatin as he thinks it was making him nauseated but he had no vomiting. Will change to Lipitor.   PERTINENT  PMH / PSH: CAD, HTN, T2DM  OBJECTIVE:   BP 110/82   Ht 5\' 11"  (1.803 m)   Wt 154 lb 12.8 oz (70.2 kg)   BMI 21.59 kg/m   Gen: NAD Resp: RRR, no murmurs CTAB: CTAB Ext: no edema in LE bilaterally  ASSESSMENT/PLAN:   Diabetes mellitus type 2, uncontrolled Tolerating Jardiance 25mg . Ordered BMP recheck but unclear why patient did not get it today. - Patient to return to lab to get BMP to ensure good kidney function and he cannot tolerate 25mg  dose - Recheck A1c in 2 months  Shortness of breath Most likely still recovering from COVID. Patient exam reassuring and no chest pain and no LE swelling, no crackles on lung exam - Chest x-ray shows persistent hazy opacities at both lung bases. No pulmonary edema. Most likely post COVID changes.      , DO [facility]

## 2019-12-29 NOTE — Progress Notes (Signed)
Patient given PHQ2 and scored a 4.  PHQ9 not given per provider.  Glennie Hawk, CMA

## 2019-12-31 DIAGNOSIS — R0602 Shortness of breath: Secondary | ICD-10-CM | POA: Insufficient documentation

## 2019-12-31 NOTE — Assessment & Plan Note (Signed)
Tolerating Jardiance 25mg . Ordered BMP recheck but unclear why patient did not get it today. - Patient to return to lab to get BMP to ensure good kidney function and he cannot tolerate 25mg  dose - Recheck A1c in 2 months

## 2019-12-31 NOTE — Assessment & Plan Note (Signed)
Most likely still recovering from COVID. Patient exam reassuring and no chest pain and no LE swelling, no crackles on lung exam - Chest x-ray shows persistent hazy opacities at both lung bases. No pulmonary edema. Most likely post COVID changes.

## 2020-01-03 ENCOUNTER — Telehealth: Payer: Self-pay | Admitting: Cardiology

## 2020-01-03 NOTE — Telephone Encounter (Signed)
OK for him to stay off of Plavix and continue ASA only.  Call Mr. Lampert with the results and send results to Arlyce Harman, DO

## 2020-01-03 NOTE — Telephone Encounter (Signed)
Spoke with pt who report for the past month he has been experiencing on and off symptoms of a bad headache, feeling like something is oozing out of his head, and can't walk. Pt state he thought symptoms were related to pravastatin and has since discontinued medication. Pt states symptoms continued to occur and now feels it's related to the Plavix in conjunction with the heat. Pt report he has discussed symptoms with pcp who recommended he contact Dr. [place]. Pt state he stopped taking plavix a week ago and feels much better. He is requesting if MD feels medication dosage should be lowered.

## 2020-01-03 NOTE — Telephone Encounter (Signed)
Pt c/o medication issue:  1. Name of Medication: Clopidogrel  2. How are you currently taking this medication (dosage and times per day)? 1 time a day  3. Are you having a reaction (difficulty breathing--STAT)?  4. What is your medication issue?  Pain in his head, feels like something is oozing out of his head, could not hardly walk when he was taking the medicine- wonder if Dr Antoine Poche could lower his dosage

## 2020-01-03 NOTE — Telephone Encounter (Signed)
Plavix does not come in a dosage lower than 75mg  daily. Reported symptoms are not commonly seen with Plavix, do not suspect medication is causing his symptoms.  Will await any further input from Dr . If pt really wishes to stay off of Plavix, could consider changing to alternate antiplatelet agent.

## 2020-01-05 NOTE — Telephone Encounter (Signed)
Pt updated and verbalized understanding. Med list updated to reflect changes.

## 2020-01-09 ENCOUNTER — Other Ambulatory Visit: Payer: Self-pay | Admitting: Family Medicine

## 2020-01-13 ENCOUNTER — Emergency Department (HOSPITAL_COMMUNITY)
Admission: EM | Admit: 2020-01-13 | Discharge: 2020-01-13 | Disposition: A | Discharge status: Home / Self Care | Payer: Medicare Other | Attending: Emergency Medicine | Admitting: Emergency Medicine

## 2020-01-13 ENCOUNTER — Encounter (HOSPITAL_COMMUNITY): Payer: Self-pay | Admitting: *Deleted

## 2020-01-13 DIAGNOSIS — Z7982 Long term (current) use of aspirin: Secondary | ICD-10-CM | POA: Insufficient documentation

## 2020-01-13 DIAGNOSIS — E1140 Type 2 diabetes mellitus with diabetic neuropathy, unspecified: Secondary | ICD-10-CM | POA: Insufficient documentation

## 2020-01-13 DIAGNOSIS — Y939 Activity, unspecified: Secondary | ICD-10-CM | POA: Diagnosis not present

## 2020-01-13 DIAGNOSIS — Y999 Unspecified external cause status: Secondary | ICD-10-CM | POA: Diagnosis not present

## 2020-01-13 DIAGNOSIS — W19XXXA Unspecified fall, initial encounter: Secondary | ICD-10-CM | POA: Diagnosis not present

## 2020-01-13 DIAGNOSIS — S80211A Abrasion, right knee, initial encounter: Secondary | ICD-10-CM | POA: Diagnosis not present

## 2020-01-13 DIAGNOSIS — Z23 Encounter for immunization: Secondary | ICD-10-CM | POA: Diagnosis not present

## 2020-01-13 DIAGNOSIS — Z955 Presence of coronary angioplasty implant and graft: Secondary | ICD-10-CM | POA: Diagnosis not present

## 2020-01-13 DIAGNOSIS — Y929 Unspecified place or not applicable: Secondary | ICD-10-CM | POA: Diagnosis not present

## 2020-01-13 DIAGNOSIS — W0110XA Fall on same level from slipping, tripping and stumbling with subsequent striking against unspecified object, initial encounter: Secondary | ICD-10-CM | POA: Insufficient documentation

## 2020-01-13 DIAGNOSIS — I2510 Atherosclerotic heart disease of native coronary artery without angina pectoris: Secondary | ICD-10-CM | POA: Diagnosis not present

## 2020-01-13 DIAGNOSIS — S80212A Abrasion, left knee, initial encounter: Secondary | ICD-10-CM | POA: Diagnosis not present

## 2020-01-13 DIAGNOSIS — S01419A Laceration without foreign body of unspecified cheek and temporomandibular area, initial encounter: Secondary | ICD-10-CM | POA: Diagnosis present

## 2020-01-13 DIAGNOSIS — I10 Essential (primary) hypertension: Secondary | ICD-10-CM | POA: Insufficient documentation

## 2020-01-13 DIAGNOSIS — S01512A Laceration without foreign body of oral cavity, initial encounter: Secondary | ICD-10-CM | POA: Diagnosis not present

## 2020-01-13 DIAGNOSIS — S80219A Abrasion, unspecified knee, initial encounter: Secondary | ICD-10-CM

## 2020-01-13 DIAGNOSIS — R0902 Hypoxemia: Secondary | ICD-10-CM | POA: Diagnosis not present

## 2020-01-13 DIAGNOSIS — Z794 Long term (current) use of insulin: Secondary | ICD-10-CM | POA: Diagnosis not present

## 2020-01-13 DIAGNOSIS — R55 Syncope and collapse: Secondary | ICD-10-CM | POA: Diagnosis not present

## 2020-01-13 DIAGNOSIS — R42 Dizziness and giddiness: Secondary | ICD-10-CM | POA: Diagnosis not present

## 2020-01-13 LAB — CBC WITH DIFFERENTIAL/PLATELET
Abs Immature Granulocytes: 0.02 10*3/uL (ref 0.00–0.07)
Basophils Absolute: 0 10*3/uL (ref 0.0–0.1)
Basophils Relative: 0 %
Eosinophils Absolute: 0.1 10*3/uL (ref 0.0–0.5)
Eosinophils Relative: 1 %
HCT: 41 % (ref 39.0–52.0)
Hemoglobin: 12.2 g/dL — ABNORMAL LOW (ref 13.0–17.0)
Immature Granulocytes: 0 %
Lymphocytes Relative: 20 %
Lymphs Abs: 1.2 10*3/uL (ref 0.7–4.0)
MCH: 23.9 pg — ABNORMAL LOW (ref 26.0–34.0)
MCHC: 29.8 g/dL — ABNORMAL LOW (ref 30.0–36.0)
MCV: 80.4 fL (ref 80.0–100.0)
Monocytes Absolute: 0.5 10*3/uL (ref 0.1–1.0)
Monocytes Relative: 9 %
Neutro Abs: 4.4 10*3/uL (ref 1.7–7.7)
Neutrophils Relative %: 70 %
Platelets: 271 10*3/uL (ref 150–400)
RBC: 5.1 MIL/uL (ref 4.22–5.81)
RDW: 21.9 % — ABNORMAL HIGH (ref 11.5–15.5)
WBC: 6.2 10*3/uL (ref 4.0–10.5)
nRBC: 0 % (ref 0.0–0.2)

## 2020-01-13 LAB — BASIC METABOLIC PANEL
Anion gap: 11 (ref 5–15)
BUN: 16 mg/dL (ref 6–20)
CO2: 26 mmol/L (ref 22–32)
Calcium: 8.7 mg/dL — ABNORMAL LOW (ref 8.9–10.3)
Chloride: 98 mmol/L (ref 98–111)
Creatinine, Ser: 1.21 mg/dL (ref 0.61–1.24)
GFR calc Af Amer: >60 mL/min (ref >60)
GFR calc non Af Amer: >60 mL/min (ref >60)
Glucose, Bld: 223 mg/dL — ABNORMAL HIGH (ref 70–99)
Potassium: 3.4 mmol/L — ABNORMAL LOW (ref 3.5–5.1)
Sodium: 135 mmol/L (ref 135–145)

## 2020-01-13 MED ORDER — LIDOCAINE-EPINEPHRINE (PF) 2 %-1:200000 IJ SOLN
10.0000 mL | Freq: Once | INTRAMUSCULAR | Status: AC
  Administered 2020-01-13: 10 mL
  Filled 2020-01-13: qty 20

## 2020-01-13 MED ORDER — TETANUS-DIPHTH-ACELL PERTUSSIS 5-2.5-18.5 LF-MCG/0.5 IM SUSP
0.5000 mL | Freq: Once | INTRAMUSCULAR | Status: AC
  Administered 2020-01-13: 0.5 mL via INTRAMUSCULAR
  Filled 2020-01-13: qty 0.5

## 2020-01-13 NOTE — ED Provider Notes (Signed)
[facility] DEPT Provider Note   CSN: [id number] Arrival date & time: 01/13/20  1611     History Chief Complaint  Patient presents with  . Fall  . Loss of Consciousness  . Facial Laceration    Paul [name]. is a 49 y.o. male.  He has a history of diabetes hypertension coronary disease.  He was out at [employer] today and on returning from the bathroom felt dizzy.  He tried to walk out to his friend's car and had a syncopal event falling and striking his face.  Complaining of laceration to corner of mouth along with abrasions over both knees.  Otherwise feels back to baseline.  No neck or back pain.  No numbness or weakness.  EMS transported the patient and his fingerstick was 177.  Patient states he has had prior episodes of syncope usually when he is straining.  No chest pain or shortness of breath.  The history is provided by the patient.  Loss of Consciousness Episode history:  Single Most recent episode:  Today Timing:  Rare Progression:  Resolved Chronicity:  Recurrent Context: normal activity   Witnessed: yes   Relieved by:  Lying down Ineffective treatments:  None tried Associated symptoms: dizziness   Associated symptoms: no chest pain, no diaphoresis, no difficulty breathing, no fever, no focal sensory loss, no focal weakness, no nausea, no seizures, no shortness of breath and no vomiting        Past Medical History:  Diagnosis Date  . Anxiety   . Bipolar disorder ([place])   . CAD (coronary artery disease)    Anterior MI 2007 with DES stenting of an occluded LAD, distal 70% stenosis, OM 60% stenosis, circumflex 90% stenosis treated with a drug-eluting stent, PA 90% stenosis treated medically.  Cath January 2013 70-80% diagonal stenosis in a small vessel, OM 50% stenosis, PDA long 75% stenosis. EF 45%. He was managed medically.  . Cardiomyopathy, ischemic    EF 45%  . Diabetes mellitus   . History of chest pain 11/25/2010  .  Hyperlipidemia   . Hypertension   . Myocardial infarction ([place])   . PTSD (post-traumatic stress disorder)   . Tuberculosis    1989, took pills for it     Patient Active Problem List   Diagnosis Date Noted  . Shortness of breath 12/31/2019  . Hospital discharge follow-up 11/08/2019  . Pneumonia due to COVID-19 virus 11/04/2019  . Neck mass 07/14/2019  . Hypotension 02/20/2019  . PTSD (post-traumatic stress disorder) 12/01/2018  . Cardiomyopathy ([place]) 01/14/2018  . Diabetic neuropathy ([place]) 01/10/2018  . Nausea alone 02/28/2013  . Cold sensation of skin 07/29/2012  . Right hand pain 05/03/2012  . Hypertension   . CAD (coronary artery disease)   . Hyperlipidemia LDL goal <100   . Stented coronary artery 04/30/2011  . Atopic dermatitis 08/19/2010  . Anxiety state 09/27/2009  . Diabetes mellitus type 2, uncontrolled ([place]) 09/19/2009    Past Surgical History:  Procedure Laterality Date  . Catherization  08/25/11   Dr. Percival Spanish  . CORONARY ANGIOPLASTY WITH STENT PLACEMENT  207, 2011   4 stents placed  . DIRECT LARYNGOSCOPY Left 08/14/2019   Procedure: DIRECT LARYNGOSCOPY;  Surgeon: Rozetta Nunnery, MD;  Location: [facility];  Service: ENT;  Laterality: Left;  . LEFT HEART CATHETERIZATION WITH CORONARY ANGIOGRAM N/A 08/25/2011   Procedure: LEFT HEART CATHETERIZATION WITH CORONARY ANGIOGRAM;  Surgeon: Minus Breeding, MD;  Location: [facility] CATH LAB;  Service: Cardiovascular;  Laterality: N/A;  . MASS BIOPSY Left 08/14/2019   Procedure: BIOPSY EXCISION OF LEFT NECK MASS;  Surgeon: Rozetta Nunnery, MD;  Location: [facility] OR;  Service: ENT;  Laterality: Left;       Family History  Problem Relation Age of Onset  . Hypertension Mother   . Hypertension Father   . Diabetes Maternal Aunt   . Diabetes Maternal Uncle   . Cancer Maternal Grandmother     Social History   Tobacco Use  . Smoking status: Never Smoker  . Smokeless tobacco: Never Used  Substance Use Topics  . Alcohol use:  No  . Drug use: No    Home Medications Prior to Admission medications   Medication Sig Start Date End Date Taking? Authorizing Provider  ARIPiprazole (ABILIFY) 5 MG tablet Take 1 tablet by mouth once daily 01/09/20   Nuala Alpha, DO  quinapril-hydrochlorothiazide (ACCURETIC) 20-25 MG tablet Take 1 tablet by mouth once daily 01/09/20   Nuala Alpha, DO  traZODone (DESYREL) 50 MG tablet TAKE 1 TABLET BY MOUTH AT BEDTIME AS NEEDED FOR SLEEP 01/09/20   Nuala Alpha, DO  aspirin 81 MG EC tablet Take 1 tablet (81 mg total) by mouth daily. 11/06/19   Lattie Haw, MD  atorvastatin (LIPITOR) 20 MG tablet Take 1 tablet (20 mg total) by mouth daily. 12/29/19   Nuala Alpha, DO  Blood Glucose Monitoring Suppl (ONE TOUCH ULTRA 2) W/DEVICE KIT Check sugar once daily. ICD10 E11.65 01/24/15   Leone Brand, MD  carvedilol (COREG) 12.5 MG tablet Take 1 tablet (12.5 mg total) by mouth 2 (two) times daily with a meal. 11/06/19   Lattie Haw, MD  citalopram (CELEXA) 20 MG tablet Take 1 tablet (20 mg total) by mouth 2 (two) times daily. 10/26/19   Nuala Alpha, DO  Continuous Glucose Monitor KIT 1 kit by Does not apply route daily. 02/20/19   Caroline More, DO  empagliflozin (JARDIANCE) 25 MG TABS tablet Take 25 mg by mouth daily. 12/01/19   Nuala Alpha, DO  ferrous sulfate 325 (65 FE) MG tablet Take 1 tablet (325 mg total) by mouth daily with breakfast. 11/07/19   Lattie Haw, MD  glucose blood (ONE TOUCH ULTRA TEST) test strip Check sugar once daily in the morning. ICD10 E11.65 01/10/18   Tonette Bihari, MD  HYDROcodone-acetaminophen (NORCO/VICODIN) 5-325 MG tablet Take 1-2 tablets by mouth every 6 (six) hours as needed for moderate pain. 08/14/19 08/13/20  Rozetta Nunnery, MD  insulin glargine (LANTUS) 100 UNIT/ML injection Inject 0.2 mLs (20 Units total) into the skin at bedtime. 12/01/19   Nuala Alpha, DO  isosorbide mononitrate (IMDUR) 30 MG 24 hr tablet Take 1 tablet (30 mg  total) by mouth daily. 03/21/19   Nuala Alpha, DO  metFORMIN (GLUCOPHAGE) 500 MG tablet Take 1 tablet (500 mg total) by mouth 2 (two) times daily with a meal. 12/01/19   Nuala Alpha, DO  Multiple Vitamin (MULTIVITAMIN WITH MINERALS) TABS tablet Take 1 tablet by mouth daily.    [provider]  nitroGLYCERIN (NITROSTAT) 0.4 MG SL tablet Place 1 tablet (0.4 mg total) under the tongue every 5 (five) minutes as needed for chest pain. 10/07/19   Nuala Alpha, DO  ONE TOUCH LANCETS MISC Use to check sugar once daily. ICD10 E11.65 01/09/15   Willeen Niece, MD  triamcinolone cream (KENALOG) 0.1 % Apply 1 application topically 2 (two) times daily. 08/01/19   Sherene Sires, DO    Allergies    Shrimp Dia Sitter  allergy]  Review of Systems   Review of Systems  Constitutional: Negative for diaphoresis and fever.  HENT: Negative for sore throat.   Eyes: Negative for visual disturbance.  Respiratory: Negative for shortness of breath.   Cardiovascular: Positive for syncope. Negative for chest pain.  Gastrointestinal: Negative for abdominal pain, nausea and vomiting.  Genitourinary: Negative for dysuria.  Musculoskeletal: Negative for neck pain.  Skin: Positive for wound. Negative for rash.  Neurological: Positive for dizziness, syncope and light-headedness. Negative for focal weakness and seizures.    Physical Exam Updated Vital Signs BP 115/82   Pulse 78   Temp 98.5 F (36.9 C) (Oral)   Resp 18   Ht 5' 11"  (1.803 m)   Wt 70.3 kg   SpO2 94%   BMI 21.62 kg/m   Physical Exam Vitals and nursing note reviewed.  Constitutional:      Appearance: He is well-developed.  HENT:     Head: Normocephalic and atraumatic.     Mouth/Throat:     Comments: Patient has approximately 1 cm intraoral laceration just at the corner of his mouth on the right.  He has no malocclusion and feels all his teeth are in the right spot.  No significant facial tenderness and no instability. Eyes:      Conjunctiva/sclera: Conjunctivae normal.  Cardiovascular:     Rate and Rhythm: Normal rate and regular rhythm.     Heart sounds: No murmur.  Pulmonary:     Effort: Pulmonary effort is normal. No respiratory distress.     Breath sounds: Normal breath sounds.  Abdominal:     Palpations: Abdomen is soft.     Tenderness: There is no abdominal tenderness.  Musculoskeletal:        General: Signs of injury present. No deformity. Normal range of motion.     Cervical back: Neck supple. No tenderness.     Comments: Patient has abrasions over both of his knees.  Full range of motion without any pain or limitations.  Upper extremities full range of motion without any pain or limitations.  No cervical thoracic or lumbar spine tenderness.  Skin:    General: Skin is warm and dry.     Capillary Refill: Capillary refill takes less than 2 seconds.  Neurological:     General: No focal deficit present.     Mental Status: He is alert and oriented to person, place, and time.     Sensory: No sensory deficit.     Motor: No weakness.     ED Results / Procedures / Treatments   Labs (all labs ordered are listed, but only abnormal results are displayed) Labs Reviewed  CBC WITH DIFFERENTIAL/PLATELET - Abnormal; Notable for the following components:      Result Value   Hemoglobin 12.2 (*)    MCH 23.9 (*)    MCHC 29.8 (*)    RDW 21.9 (*)    All other components within normal limits  BASIC METABOLIC PANEL - Abnormal; Notable for the following components:   Potassium 3.4 (*)    Glucose, Bld 223 (*)    Calcium 8.7 (*)    All other components within normal limits    EKG EKG Interpretation  Date/Time:  Saturday January 13 2020 17:11:17 EDT Ventricular Rate:  80 PR Interval:    QRS Duration: 110 QT Interval:  390 QTC Calculation: 450 R Axis:   -18 Text Interpretation: Sinus rhythm Inferior infarct, old Anterolateral infarct, age indeterminate No significant change since prior 3/21 Confirmed  by Aletta Edouard [phone]) on 01/13/2020 5:46:50 PM   Radiology No results found.  Procedures .Marland KitchenLaceration Repair  Date/Time: 01/13/2020 6:11 PM Performed by: Hayden Rasmussen, MD Authorized by: Hayden Rasmussen, MD   Consent:    Consent obtained:  Verbal   Consent given by:  Patient   Risks discussed:  Infection, pain, poor cosmetic result, poor wound healing and retained foreign body   Alternatives discussed:  No treatment and delayed treatment Anesthesia (see MAR for exact dosages):    Anesthesia method:  Local infiltration   Local anesthetic:  Lidocaine 2% WITH epi Laceration details:    Location:  Mouth   Mouth location:  R buccal mucosa   Length (cm):  2 Repair type:    Repair type:  Simple Skin repair:    Repair method:  Sutures   Suture size:  5-0   Wound skin closure material used: vicryl.   Suture technique:  Simple interrupted   Number of sutures:  2 Approximation:    Approximation:  Close Post-procedure details:    Dressing:  Open (no dressing)   Patient tolerance of procedure:  Tolerated well, no immediate complications   (including critical care time)  Medications Ordered in ED Medications  lidocaine-EPINEPHrine (XYLOCAINE W/EPI) 2 %-1:200000 (PF) injection 10 mL (has no administration in time range)  Tdap (BOOSTRIX) injection 0.5 mL (has no administration in time range)    ED Course  I have reviewed the triage vital signs and the nursing notes.  Pertinent labs & imaging results that were available during my care of the patient were reviewed by me and considered in my medical decision making (see chart for details).    MDM Rules/Calculators/A&P                     This patient complains of syncope fall this laceration; this involves an extensive number of treatment Options and is a complaint that carries with it a high risk of complications and Morbidity. The differential includes arrhythmia, vagal, hypovolemia, anemia, metabolic derangement  I ordered,  reviewed and interpreted labs, which included CBC with stable hemoglobin, chemistries with elevated glucose in the setting of being diabetic, slightly low potassium and calcium. I ordered medication tetanus update Additional history obtained from patient's friend who was with him when he fainted Previous records obtained and reviewed in epic  After the interventions stated above, I reevaluated the patient and found patient to be hemodynamically stable.  He is ambulated in the department without any difficulty.  Has had syncope before.  Recommend follow-up with primary care doctor and other return instructions discussed.   Final Clinical Impression(s) / ED Diagnoses Final diagnoses:  Syncope and collapse  Laceration of internal mouth, initial encounter  Abrasion of knee, unspecified laterality, initial encounter    Rx / DC Orders ED Discharge Orders    None       Hayden Rasmussen, MD 01/14/20 [phone]

## 2020-01-13 NOTE — Discharge Instructions (Addendum)
You were seen in the emergency department for evaluation of injuries from a fall after you fainted.  You had blood work and an EKG that did not show any serious findings.  You had a laceration inside your mouth that required a few sutures that will dissolve on their own.  Please follow-up with your regular doctor and return to the emergency department if any worsening or concerning symptoms

## 2020-01-13 NOTE — ED Triage Notes (Signed)
Per EMS, pt was eating lunch, felt dizzy and had to sit down, then stood up and had syncopal episode/fall. Pt has been having issues with dizziness, was taken off Plavix 3-4 weeks ago because he thought it was causing dizziness. No neck or back pain from fall. Laceration on right upper lip.   142/94 76 95% CBG 177

## 2020-01-15 ENCOUNTER — Ambulatory Visit (INDEPENDENT_AMBULATORY_CARE_PROVIDER_SITE_OTHER): Payer: Medicare Other | Admitting: Family Medicine

## 2020-01-15 ENCOUNTER — Other Ambulatory Visit: Payer: Self-pay

## 2020-01-15 VITALS — BP 110/72 | Ht 71.0 in | Wt 160.1 lb

## 2020-01-15 DIAGNOSIS — E11649 Type 2 diabetes mellitus with hypoglycemia without coma: Secondary | ICD-10-CM | POA: Diagnosis not present

## 2020-01-15 DIAGNOSIS — K130 Diseases of lips: Secondary | ICD-10-CM

## 2020-01-15 DIAGNOSIS — S01531A Puncture wound without foreign body of lip, initial encounter: Secondary | ICD-10-CM

## 2020-01-15 DIAGNOSIS — I255 Ischemic cardiomyopathy: Secondary | ICD-10-CM

## 2020-01-15 DIAGNOSIS — L089 Local infection of the skin and subcutaneous tissue, unspecified: Secondary | ICD-10-CM

## 2020-01-15 LAB — POCT GLYCOSYLATED HEMOGLOBIN (HGB A1C): HbA1c, POC (controlled diabetic range): 7.7 % — AB (ref 0.0–7.0)

## 2020-01-15 MED ORDER — IBUPROFEN 600 MG PO TABS: 600.0000 mg | ORAL_TABLET | Freq: Three times a day (TID) | ORAL | 0 refills | Status: DC | PRN

## 2020-01-15 MED ORDER — AMOXICILLIN-POT CLAVULANATE 875-125 MG PO TABS: 1.0000 | ORAL_TABLET | Freq: Two times a day (BID) | ORAL | 0 refills | Status: DC

## 2020-01-15 NOTE — Progress Notes (Signed)
a 

## 2020-01-15 NOTE — Progress Notes (Signed)
    SUBJECTIVE:   CHIEF COMPLAINT / HPI:   Lip laceration Patient is following up for right upper lip pain. Patient had a recent syncopal episode where he fell and struck his lip. He was seen in the ED. Suture was placed to repair the laceration. Since that time, he has had worsening swelling and pain. He is also had foul-smelling drainage come from it. He has been using old oxycodone that he had for pain. He has not been trying Tylenol or ibuprofen. He denies fevers or chills. He denies any sinus tenderness.  Diabetes mellitus type 2 Hemoglobin A1c improved today from 8.6 to 7.7. Patient takes Metformin 5 mg twice daily, empagliflozin 25 mg, Lantus 20 units. He is on ACE inhibitor. He is also taking statin.  PERTINENT  PMH / PSH: Diabetes  OBJECTIVE:   BP 110/72   Ht 5\' 11"  (1.803 m)   Wt 160 lb 2 oz (72.6 kg)   BMI 22.33 kg/m   General: NAD HEENT: MMM, right upper lip with suture and purulent drainage, there is lip swelling as well     ASSESSMENT/PLAN:   Diabetes mellitus type 2, uncontrolled Improved A1C. Recently seen by PCP for medication adjustment. Recommend follow up with PCP in 2 months.   Pierced lip infection Purulent infection. Seems localized to the lip. Draining well.  - Augmenting - Ibuprofen PRN pain - F/U in 1 week for check or sooner if worsening - Instructed to go to ED if cannot be seen or has significant pain or fever     , MD [facility]

## 2020-01-15 NOTE — Patient Instructions (Signed)
We are prescribing you Augmentin for your lip infection.  You may take ibuprofen for pain.  Please come back in 1 week for reassessment.

## 2020-01-16 DIAGNOSIS — L089 Local infection of the skin and subcutaneous tissue, unspecified: Secondary | ICD-10-CM | POA: Insufficient documentation

## 2020-01-16 DIAGNOSIS — S01531A Puncture wound without foreign body of lip, initial encounter: Secondary | ICD-10-CM | POA: Insufficient documentation

## 2020-01-16 NOTE — Assessment & Plan Note (Signed)
Purulent infection. Seems localized to the lip. Draining well.  - Augmenting - Ibuprofen PRN pain - F/U in 1 week for check or sooner if worsening - Instructed to go to ED if cannot be seen or has significant pain or fever

## 2020-01-16 NOTE — Assessment & Plan Note (Signed)
Improved A1C. Recently seen by PCP for medication adjustment. Recommend follow up with PCP in 2 months.

## 2020-02-08 DIAGNOSIS — Z794 Long term (current) use of insulin: Secondary | ICD-10-CM | POA: Insufficient documentation

## 2020-02-08 DIAGNOSIS — E118 Type 2 diabetes mellitus with unspecified complications: Secondary | ICD-10-CM | POA: Insufficient documentation

## 2020-02-08 NOTE — Progress Notes (Signed)
Cardiology Office Note   Date:  02/09/2020   ID:  Paul [name]., DOB January 25, 1971, MRN [id number]  PCP:  Cleophas Dunker, DO  Cardiologist:   Minus Breeding, MD   Chief Complaint  Patient presents with  . Loss of Consciousness      History of Present Illness: Paul [name]. is a 49 y.o. male who is referred back to see me after syncopal episode.  He has a history of coronary artery disease with mildly reduced ejection fraction.  He was living in [place] but actually now has moved back here.  He had an episode of syncope recently and was in the emergency room.  I reviewed these records for this visit.  He said he was at a restaurant.  He needs his foods.  He was very hot outside.  He wants to go to his friend's car and fell out suddenly.  He does not remember having prodrome.  He was not have any palpitations.  He does not describe orthostatic symptoms.  He was not having any pain.  In the emergency room there was no imaging done.  There were no cardiac enzymes.  There was no mention of arrhythmia.  EKG did demonstrate some anterolateral T wave inversions that I did notice on the more recent EKG earlier in the year..  The patient says otherwise he been doing well.  He is not been having any palpitations, presyncope or syncope.  He might occasionally have some mild orthostatic symptoms.  He does not have chest pressure, neck or arm discomfort.  He has not had any shortness of breath, PND or orthopnea.  He said no weight gain or edema.  Of note the patient was in the hospital in March with Covid.  He says he is probably not completely recovered from this though at the same time he says he is not having any alteration in his breathing.  He had no significant troponin trend although he did peak at about 49.  He had mildly elevated inflammatory markers.     Past Medical History:  Diagnosis Date  . Anxiety   . Bipolar disorder ([place])   . CAD (coronary artery disease)     Anterior MI 2007 with DES stenting of an occluded LAD, distal 70% stenosis, OM 60% stenosis, circumflex 90% stenosis treated with a drug-eluting stent, PA 90% stenosis treated medically.  Cath January 2013 70-80% diagonal stenosis in a small vessel, OM 50% stenosis, PDA long 75% stenosis. EF 45%. He was managed medically.  . Cardiomyopathy, ischemic    EF 45%  . Diabetes mellitus   . History of chest pain 11/25/2010  . Hyperlipidemia   . Hypertension   . Myocardial infarction ([place])   . PTSD (post-traumatic stress disorder)   . Tuberculosis    1989, took pills for it     Past Surgical History:  Procedure Laterality Date  . Catherization  08/25/11   Dr. Percival Spanish  . CORONARY ANGIOPLASTY WITH STENT PLACEMENT  207, 2011   4 stents placed  . DIRECT LARYNGOSCOPY Left 08/14/2019   Procedure: DIRECT LARYNGOSCOPY;  Surgeon: Rozetta Nunnery, MD;  Location: [facility];  Service: ENT;  Laterality: Left;  . LEFT HEART CATHETERIZATION WITH CORONARY ANGIOGRAM N/A 08/25/2011   Procedure: LEFT HEART CATHETERIZATION WITH CORONARY ANGIOGRAM;  Surgeon: Minus Breeding, MD;  Location: [facility] CATH LAB;  Service: Cardiovascular;  Laterality: N/A;  . MASS BIOPSY Left 08/14/2019   Procedure: BIOPSY EXCISION OF LEFT NECK MASS;  Surgeon:  Rozetta Nunnery, MD;  Location: [facility];  Service: ENT;  Laterality: Left;     Current Outpatient Medications  Medication Sig Dispense Refill  . ARIPiprazole (ABILIFY) 5 MG tablet Take 1 tablet by mouth once daily 30 tablet 0  . aspirin 81 MG EC tablet Take 1 tablet (81 mg total) by mouth daily. 90 tablet 0  . atorvastatin (LIPITOR) 20 MG tablet Take 1 tablet (20 mg total) by mouth daily. 90 tablet 3  . Blood Glucose Monitoring Suppl (ONE TOUCH ULTRA 2) W/DEVICE KIT Check sugar once daily. ICD10 E11.65 1 each 0  . carvedilol (COREG) 12.5 MG tablet Take 1 tablet (12.5 mg total) by mouth 2 (two) times daily with a meal. 60 tablet 0  . citalopram (CELEXA) 20 MG tablet Take 1 tablet  (20 mg total) by mouth 2 (two) times daily. 180 tablet 3  . Continuous Glucose Monitor KIT 1 kit by Does not apply route daily. 1 kit 0  . empagliflozin (JARDIANCE) 25 MG TABS tablet Take 25 mg by mouth daily. 90 tablet 3  . glucose blood (ONE TOUCH ULTRA TEST) test strip Check sugar once daily in the morning. ICD10 E11.65 30 each 5  . insulin glargine (LANTUS) 100 UNIT/ML injection Inject 0.2 mLs (20 Units total) into the skin at bedtime. (Patient taking differently: Inject 25 Units into the skin daily. ) 10 mL 11  . isosorbide mononitrate (IMDUR) 30 MG 24 hr tablet Take 1 tablet (30 mg total) by mouth daily. 90 tablet 6  . metFORMIN (GLUCOPHAGE) 1000 MG tablet Take 1,000 mg by mouth 2 (two) times daily.    . Multiple Vitamin (MULTIVITAMIN WITH MINERALS) TABS tablet Take 1 tablet by mouth daily.    . nitroGLYCERIN (NITROSTAT) 0.4 MG SL tablet Place 1 tablet (0.4 mg total) under the tongue every 5 (five) minutes as needed for chest pain. 30 tablet 1  . ONE TOUCH LANCETS MISC Use to check sugar once daily. ICD10 E11.65 30 each 5  . quinapril-hydrochlorothiazide (ACCURETIC) 20-25 MG tablet Take 1 tablet by mouth once daily 90 tablet 0  . traZODone (DESYREL) 50 MG tablet TAKE 1 TABLET BY MOUTH AT BEDTIME AS NEEDED FOR SLEEP 30 tablet 0  . triamcinolone cream (KENALOG) 0.1 % Apply 1 application topically 2 (two) times daily. 453.6 g 0   No current facility-administered medications for this visit.    Allergies:   Shrimp [shellfish allergy]    ROS:  Please see the history of present illness.   Otherwise, review of systems are positive for none.   All other systems are reviewed and negative.    PHYSICAL EXAM: VS:  Ht _0  (1.803 m)   Wt 160 lb (72.6 kg)   BMI 22.32 kg/m  , BMI Body mass index is 22.32 kg/m.  GENERAL:  Well appearing NECK:  No jugular venous distention, waveform within normal limits, carotid upstroke brisk and symmetric, no bruits, no thyromegaly LUNGS:  Clear to  auscultation bilaterally CHEST:  Unremarkable HEART:  PMI not displaced or sustained,S1 and S2 within normal limits, no S3, no S4, no clicks, no rubs, no murmurs ABD:  Flat, positive bowel sounds normal in frequency in pitch, no bruits, no rebound, no guarding, no midline pulsatile mass, no hepatomegaly, no splenomegaly EXT:  2 plus pulses throughout, no edema, no cyanosis no clubbing    EKG:  EKG is not ordered today. The ekg ordered 01/13/2020 demonstrates sinus rhythm, rate 80, axis within normal limits, lateral and anterior T  wave inversions that seem to be more more prominent than previously noted.   Recent Labs: 02/20/2019: BNP 16.0 07/14/2019: TSH 1.210 11/06/2019: ALT 12 01/13/2020: BUN 16; Creatinine, Ser 1.21; Hemoglobin 12.2; Platelets 271; Potassium 3.4; Sodium 135    Lipid Panel    Component Value Date/Time   CHOL 120 11/05/2019 0132   CHOL 141 01/10/2018 1419   TRIG 210 (H) 11/05/2019 0132   HDL 18 (L) 11/05/2019 0132   HDL 24 (L) 01/10/2018 1419   CHOLHDL 6.7 11/05/2019 0132   VLDL 42 (H) 11/05/2019 0132   LDLCALC 60 11/05/2019 0132   LDLCALC 72 01/10/2018 1419   LDLDIRECT 84 09/03/2011 1548      Wt Readings from Last 3 Encounters:  02/09/20 160 lb (72.6 kg)  01/15/20 160 lb 2 oz (72.6 kg)  01/13/20 155 lb (70.3 kg)      Other studies Reviewed: Additional studies/ records that were reviewed today include: Hospital records. Review of the above records demonstrates:  Please see elsewhere in the note.     ASSESSMENT AND PLAN:  CAD (coronary artery disease) - He is not having any anginal symptoms.    At this point I am going to start with work-up as below but I am not strongly suspecting ischemia.  He will continue with risk reduction.    Cardiomyopathy, ischemic - I am going to start with an echocardiogram.  Further work-up for his coronary disease will be based on this.   Hyperlipidemia - LDL was 60 with an HDL only of 18 in March of this year.  No  change in therapy.   DM - A1c was up slightly to 8.6 recently.  This is much better than it used to be but up from the recent level of 7.7.  I will defer to Cleophas Dunker, DO.   Covid education -  I did encourage him to get the vaccine despite the fact that he has had Covid.   Current medicines are reviewed at length with the patient today.  The patient does not have concerns regarding medicines.  The following changes have been made:  no change  Labs/ tests ordered today include:   Orders Placed This Encounter  Procedures  . CARDIAC EVENT MONITOR  . ECHOCARDIOGRAM COMPLETE     Disposition:   FU with me after the above studies.     Signed, Minus Breeding, MD  02/09/2020 2:02 PM    [place]

## 2020-02-09 ENCOUNTER — Encounter: Payer: Self-pay | Admitting: Cardiology

## 2020-02-09 ENCOUNTER — Telehealth: Payer: Self-pay

## 2020-02-09 ENCOUNTER — Other Ambulatory Visit: Payer: Self-pay

## 2020-02-09 ENCOUNTER — Ambulatory Visit (INDEPENDENT_AMBULATORY_CARE_PROVIDER_SITE_OTHER): Payer: Medicare Other | Admitting: Cardiology

## 2020-02-09 VITALS — Ht 71.0 in | Wt 160.0 lb

## 2020-02-09 DIAGNOSIS — E785 Hyperlipidemia, unspecified: Secondary | ICD-10-CM | POA: Diagnosis not present

## 2020-02-09 DIAGNOSIS — I255 Ischemic cardiomyopathy: Secondary | ICD-10-CM | POA: Diagnosis not present

## 2020-02-09 DIAGNOSIS — R55 Syncope and collapse: Secondary | ICD-10-CM

## 2020-02-09 DIAGNOSIS — I2510 Atherosclerotic heart disease of native coronary artery without angina pectoris: Secondary | ICD-10-CM

## 2020-02-09 DIAGNOSIS — Z794 Long term (current) use of insulin: Secondary | ICD-10-CM

## 2020-02-09 DIAGNOSIS — E118 Type 2 diabetes mellitus with unspecified complications: Secondary | ICD-10-CM

## 2020-02-09 NOTE — Patient Instructions (Signed)
Medication Instructions:  Your physician recommends that you continue on your current medications as directed. Please refer to the Current Medication list given to you today.  *If you need a refill on your cardiac medications before your next appointment, please call your pharmacy*  Lab Work: NONE   Testing/Procedures: Your physician has requested that you have an echocardiogram. Echocardiography is a painless test that uses sound waves to create images of your heart. It provides your doctor with information about the size and shape of your heart and how well your heart's chambers and valves are working. This procedure takes approximately one hour. There are no restrictions for this procedure. [facility] AT [address]   Your physician has recommended that you wear an event monitor. Event monitors are medical devices that record the heart's electrical activity. Doctors most often [place] these monitors to diagnose arrhythmias. Arrhythmias are problems with the speed or rhythm of the heartbeat. The monitor is a small, portable device. You can wear one while you do your normal daily activities. This is usually used to diagnose what is causing palpitations/syncope (passing out). 30 DAY   Follow-Up: At [facility], you and your health needs are our priority.  As part of our continuing mission to provide you with exceptional heart care, we have created designated Provider Care Teams.  These Care Teams include your primary Cardiologist (physician) and Advanced Practice Providers (APPs -  Physician Assistants and Nurse Practitioners) who all work together to provide you with the care you need, when you need it.  We recommend signing up for the patient portal called "MyChart".  Sign up information is provided on this After Visit Summary.  MyChart is used to connect with patients for Virtual Visits (Telemedicine).  Patients are able to view lab/test results, encounter notes, upcoming  appointments, etc.  Non-urgent messages can be sent to your provider as well.   To learn more about what you can do with MyChart, go to [email or web address].    Your next appointment:   2 month(s)  The format for your next appointment:   In Person  Provider:   You may see Rollene Rotunda, MD or one of the following Advanced Practice Providers on your designated Care Team:    Theodore Demark, PA-C  Joni Reining, DNP, ANP  Cadence Fransico Michael, NP  Other Instructions  [employer] Cardiac Event Monitor Instructions Your physician has requested you wear your cardiac event monitor for __30___ days, (1-30). [employer] may call or text to confirm a shipping address. The monitor will be sent to a land address via UPS. [employer] will not ship a monitor to a PO BOX. It typically takes 3-5 days to receive your monitor after it has been enrolled. [employer] will assist with USPS tracking if your package is delayed. The telephone number for [employer] is [phone]. Once you have received your monitor, please review the enclosed instructions. Instruction tutorials can also be viewed under help and settings on the enclosed cell phone. Your monitor has already been registered assigning a specific monitor serial # to you.  Applying the monitor Remove cell phone from case and turn it on. The cell phone works as IT consultant and needs to be within [employer] of you at all times. The cell phone will need to be charged on a daily basis. We recommend you plug the cell phone into the enclosed charger at your bedside table every night.  Monitor batteries: You will receive two monitor batteries labelled #1 and #  2. These are your recorders. Plug battery #2 onto the second connection on the enclosed charger. Keep one battery on the charger at all times. This will keep the monitor battery deactivated. It will also keep it fully charged for when you need to switch your monitor batteries. A small light  will be blinking on the battery emblem when it is charging. The light on the battery emblem will remain on when the battery is fully charged.  Open package of a Monitor strip. Insert battery #1 into black hood on strip and gently squeeze monitor battery onto connection as indicated in instruction booklet. Set aside while preparing skin.  Choose location for your strip, vertical or horizontal, as indicated in the instruction booklet. Shave to remove all hair from location. There cannot be any lotions, oils, powders, or colognes on skin where monitor is to be applied. Wipe skin clean with enclosed Saline wipe. Dry skin completely.  Peel paper labeled #1 off the back of the Monitor strip exposing the adhesive. Place the monitor on the chest in the vertical or horizontal position shown in the instruction booklet. One arrow on the monitor strip must be pointing upward. Carefully remove paper labeled #2, attaching remainder of strip to your skin. Try not to create any folds or wrinkles in the strip as you apply it.  Firmly press and release the circle in the center of the monitor battery. You will hear a small beep. This is turning the monitor battery on. The heart emblem on the monitor battery will light up every 5 seconds if the monitor battery in turned on and connected to the patient securely. Do not push and hold the circle down as this turns the monitor battery off. The cell phone will locate the monitor battery. A screen will appear on the cell phone checking the connection of your monitor strip. This may read poor connection initially but change to good connection within the next minute. Once your monitor accepts the connection you will hear a series of 3 beeps followed by a climbing crescendo of beeps. A screen will appear on the cell phone showing the two monitor strip placement options. Touch the picture that demonstrates where you applied the monitor strip.  Your monitor strip and  battery are waterproof. You are able to shower, bathe, or swim with the monitor on. They just ask you do not submerge deeper than 3 feet underwater. We recommend removing the monitor if you are swimming in a lake, river, or ocean.  Your monitor battery will need to be switched to a fully charged monitor battery approximately once a week. The cell phone will alert you of an action which needs to be made.  On the cell phone, tap for details to reveal connection status, monitor battery status, and cell phone battery status. The green dots indicates your monitor is in good status. A red dot indicates there is something that needs your attention.  To record a symptom, click the circle on the monitor battery. In 30-60 seconds a list of symptoms will appear on the cell phone. Select your symptom and tap save. Your monitor will record a sustained or significant arrhythmia regardless of you clicking the button. Some patients do not feel the heart rhythm irregularities. [employer] will notify us of any serious or critical events.  Refer to instruction booklet for instructions on switching batteries, changing strips, the Do not disturb or Pause features, or any additional questions.  Call [employer] at [phone], to confirm your monitor  is transmitting and record your baseline. They will answer any questions you may have regarding the monitor instructions at that time.  Returning the monitor to [employer] Place all equipment back into blue box. Peel off strip of paper to expose adhesive and close box securely. There is a prepaid UPS shipping label on this box. Drop in a UPS drop box, or at a UPS facility like [employer]. You may also contact [employer] to arrange UPS to pick up monitor package at your home.

## 2020-02-09 NOTE — Telephone Encounter (Signed)
LM for pt to call back to verify physical address so I can get his Event monitor registered to be mailed to him.

## 2020-02-16 ENCOUNTER — Encounter (HOSPITAL_COMMUNITY): Payer: Self-pay | Admitting: Emergency Medicine

## 2020-02-16 ENCOUNTER — Inpatient Hospital Stay (HOSPITAL_COMMUNITY)
Admission: EM | Admit: 2020-02-16 | Discharge: 2020-02-22 | DRG: 252 | Disposition: A | Discharge status: Home Health | Payer: Medicare Other | Attending: Family Medicine | Admitting: Family Medicine

## 2020-02-16 ENCOUNTER — Other Ambulatory Visit: Payer: Self-pay

## 2020-02-16 DIAGNOSIS — I82412 Acute embolism and thrombosis of left femoral vein: Secondary | ICD-10-CM | POA: Diagnosis not present

## 2020-02-16 DIAGNOSIS — Z8249 Family history of ischemic heart disease and other diseases of the circulatory system: Secondary | ICD-10-CM

## 2020-02-16 DIAGNOSIS — D509 Iron deficiency anemia, unspecified: Secondary | ICD-10-CM | POA: Diagnosis present

## 2020-02-16 DIAGNOSIS — E1151 Type 2 diabetes mellitus with diabetic peripheral angiopathy without gangrene: Principal | ICD-10-CM | POA: Diagnosis present

## 2020-02-16 DIAGNOSIS — I471 Supraventricular tachycardia: Secondary | ICD-10-CM | POA: Diagnosis not present

## 2020-02-16 DIAGNOSIS — F4310 Post-traumatic stress disorder, unspecified: Secondary | ICD-10-CM | POA: Diagnosis present

## 2020-02-16 DIAGNOSIS — E876 Hypokalemia: Secondary | ICD-10-CM | POA: Diagnosis not present

## 2020-02-16 DIAGNOSIS — I70229 Atherosclerosis of native arteries of extremities with rest pain, unspecified extremity: Secondary | ICD-10-CM | POA: Diagnosis present

## 2020-02-16 DIAGNOSIS — I110 Hypertensive heart disease with heart failure: Secondary | ICD-10-CM | POA: Diagnosis present

## 2020-02-16 DIAGNOSIS — I5023 Acute on chronic systolic (congestive) heart failure: Secondary | ICD-10-CM | POA: Diagnosis not present

## 2020-02-16 DIAGNOSIS — Z8616 Personal history of COVID-19: Secondary | ICD-10-CM

## 2020-02-16 DIAGNOSIS — Z20822 Contact with and (suspected) exposure to covid-19: Secondary | ICD-10-CM | POA: Diagnosis present

## 2020-02-16 DIAGNOSIS — I255 Ischemic cardiomyopathy: Secondary | ICD-10-CM | POA: Diagnosis present

## 2020-02-16 DIAGNOSIS — I252 Old myocardial infarction: Secondary | ICD-10-CM

## 2020-02-16 DIAGNOSIS — U071 COVID-19: Secondary | ICD-10-CM | POA: Diagnosis not present

## 2020-02-16 DIAGNOSIS — I2510 Atherosclerotic heart disease of native coronary artery without angina pectoris: Secondary | ICD-10-CM | POA: Diagnosis present

## 2020-02-16 DIAGNOSIS — I959 Hypotension, unspecified: Secondary | ICD-10-CM | POA: Diagnosis not present

## 2020-02-16 DIAGNOSIS — Z833 Family history of diabetes mellitus: Secondary | ICD-10-CM

## 2020-02-16 DIAGNOSIS — R209 Unspecified disturbances of skin sensation: Secondary | ICD-10-CM | POA: Diagnosis present

## 2020-02-16 DIAGNOSIS — F3181 Bipolar II disorder: Secondary | ICD-10-CM | POA: Diagnosis not present

## 2020-02-16 DIAGNOSIS — I998 Other disorder of circulatory system: Secondary | ICD-10-CM | POA: Diagnosis not present

## 2020-02-16 DIAGNOSIS — Z794 Long term (current) use of insulin: Secondary | ICD-10-CM

## 2020-02-16 DIAGNOSIS — E785 Hyperlipidemia, unspecified: Secondary | ICD-10-CM | POA: Diagnosis present

## 2020-02-16 DIAGNOSIS — I5020 Unspecified systolic (congestive) heart failure: Secondary | ICD-10-CM

## 2020-02-16 DIAGNOSIS — Z955 Presence of coronary angioplasty implant and graft: Secondary | ICD-10-CM

## 2020-02-16 DIAGNOSIS — N12 Tubulo-interstitial nephritis, not specified as acute or chronic: Secondary | ICD-10-CM | POA: Diagnosis not present

## 2020-02-16 DIAGNOSIS — Z7982 Long term (current) use of aspirin: Secondary | ICD-10-CM

## 2020-02-16 DIAGNOSIS — E1140 Type 2 diabetes mellitus with diabetic neuropathy, unspecified: Secondary | ICD-10-CM | POA: Diagnosis present

## 2020-02-16 DIAGNOSIS — N2889 Other specified disorders of kidney and ureter: Secondary | ICD-10-CM | POA: Diagnosis present

## 2020-02-16 DIAGNOSIS — Z809 Family history of malignant neoplasm, unspecified: Secondary | ICD-10-CM

## 2020-02-16 DIAGNOSIS — D62 Acute posthemorrhagic anemia: Secondary | ICD-10-CM | POA: Diagnosis not present

## 2020-02-16 DIAGNOSIS — E7800 Pure hypercholesterolemia, unspecified: Secondary | ICD-10-CM | POA: Diagnosis present

## 2020-02-16 DIAGNOSIS — I700 Atherosclerosis of aorta: Secondary | ICD-10-CM | POA: Diagnosis not present

## 2020-02-16 DIAGNOSIS — Z91013 Allergy to seafood: Secondary | ICD-10-CM

## 2020-02-16 DIAGNOSIS — Z419 Encounter for procedure for purposes other than remedying health state, unspecified: Secondary | ICD-10-CM

## 2020-02-16 DIAGNOSIS — I70223 Atherosclerosis of native arteries of extremities with rest pain, bilateral legs: Secondary | ICD-10-CM | POA: Diagnosis present

## 2020-02-16 LAB — CBC WITH DIFFERENTIAL/PLATELET
Abs Immature Granulocytes: 0.01 10*3/uL (ref 0.00–0.07)
Basophils Absolute: 0 10*3/uL (ref 0.0–0.1)
Basophils Relative: 0 %
Eosinophils Absolute: 0 10*3/uL (ref 0.0–0.5)
Eosinophils Relative: 0 %
HCT: 41.7 % (ref 39.0–52.0)
Hemoglobin: 12.3 g/dL — ABNORMAL LOW (ref 13.0–17.0)
Immature Granulocytes: 0 %
Lymphocytes Relative: 34 %
Lymphs Abs: 2.3 10*3/uL (ref 0.7–4.0)
MCH: 22.5 pg — ABNORMAL LOW (ref 26.0–34.0)
MCHC: 29.5 g/dL — ABNORMAL LOW (ref 30.0–36.0)
MCV: 76.2 fL — ABNORMAL LOW (ref 80.0–100.0)
Monocytes Absolute: 0.6 10*3/uL (ref 0.1–1.0)
Monocytes Relative: 9 %
Neutro Abs: 3.9 10*3/uL (ref 1.7–7.7)
Neutrophils Relative %: 57 %
Platelets: 341 10*3/uL (ref 150–400)
RBC: 5.47 MIL/uL (ref 4.22–5.81)
RDW: 20 % — ABNORMAL HIGH (ref 11.5–15.5)
WBC: 6.9 10*3/uL (ref 4.0–10.5)
nRBC: 0 % (ref 0.0–0.2)

## 2020-02-16 LAB — COMPREHENSIVE METABOLIC PANEL
ALT: 21 U/L (ref 0–44)
AST: 49 U/L — ABNORMAL HIGH (ref 15–41)
Albumin: 3.1 g/dL — ABNORMAL LOW (ref 3.5–5.0)
Alkaline Phosphatase: 48 U/L (ref 38–126)
Anion gap: 10 (ref 5–15)
BUN: 12 mg/dL (ref 6–20)
CO2: 27 mmol/L (ref 22–32)
Calcium: 8.8 mg/dL — ABNORMAL LOW (ref 8.9–10.3)
Chloride: 99 mmol/L (ref 98–111)
Creatinine, Ser: 1.19 mg/dL (ref 0.61–1.24)
GFR calc Af Amer: >60 mL/min (ref >60)
GFR calc non Af Amer: >60 mL/min (ref >60)
Glucose, Bld: 126 mg/dL — ABNORMAL HIGH (ref 70–99)
Potassium: 3.1 mmol/L — ABNORMAL LOW (ref 3.5–5.1)
Sodium: 136 mmol/L (ref 135–145)
Total Bilirubin: 0.9 mg/dL (ref 0.3–1.2)
Total Protein: 8.3 g/dL — ABNORMAL HIGH (ref 6.5–8.1)

## 2020-02-16 NOTE — ED Triage Notes (Signed)
Patient reports left lower leg numbness onset yesterday , ambulatory , denies injury , no swelling or deformity .

## 2020-02-17 ENCOUNTER — Inpatient Hospital Stay (HOSPITAL_COMMUNITY): Payer: Medicare Other

## 2020-02-17 ENCOUNTER — Encounter (HOSPITAL_COMMUNITY)
Admission: EM | Disposition: A | Discharge status: Home Health | Payer: Self-pay | Source: Home / Self Care | Attending: Family Medicine

## 2020-02-17 ENCOUNTER — Emergency Department (HOSPITAL_COMMUNITY): Payer: Medicare Other | Admitting: Anesthesiology

## 2020-02-17 ENCOUNTER — Emergency Department (HOSPITAL_COMMUNITY): Payer: Medicare Other

## 2020-02-17 ENCOUNTER — Inpatient Hospital Stay (HOSPITAL_COMMUNITY): Payer: Medicare Other | Admitting: Anesthesiology

## 2020-02-17 ENCOUNTER — Encounter (HOSPITAL_COMMUNITY): Payer: Self-pay | Admitting: Certified Registered"

## 2020-02-17 DIAGNOSIS — I5021 Acute systolic (congestive) heart failure: Secondary | ICD-10-CM | POA: Diagnosis not present

## 2020-02-17 DIAGNOSIS — I2510 Atherosclerotic heart disease of native coronary artery without angina pectoris: Secondary | ICD-10-CM | POA: Diagnosis not present

## 2020-02-17 DIAGNOSIS — Z8249 Family history of ischemic heart disease and other diseases of the circulatory system: Secondary | ICD-10-CM | POA: Diagnosis not present

## 2020-02-17 DIAGNOSIS — R209 Unspecified disturbances of skin sensation: Secondary | ICD-10-CM | POA: Diagnosis present

## 2020-02-17 DIAGNOSIS — I959 Hypotension, unspecified: Secondary | ICD-10-CM | POA: Diagnosis not present

## 2020-02-17 DIAGNOSIS — Z20822 Contact with and (suspected) exposure to covid-19: Secondary | ICD-10-CM | POA: Diagnosis present

## 2020-02-17 DIAGNOSIS — I361 Nonrheumatic tricuspid (valve) insufficiency: Secondary | ICD-10-CM | POA: Diagnosis not present

## 2020-02-17 DIAGNOSIS — N12 Tubulo-interstitial nephritis, not specified as acute or chronic: Secondary | ICD-10-CM | POA: Diagnosis not present

## 2020-02-17 DIAGNOSIS — I70221 Atherosclerosis of native arteries of extremities with rest pain, right leg: Secondary | ICD-10-CM | POA: Diagnosis not present

## 2020-02-17 DIAGNOSIS — I255 Ischemic cardiomyopathy: Secondary | ICD-10-CM | POA: Diagnosis present

## 2020-02-17 DIAGNOSIS — M79604 Pain in right leg: Secondary | ICD-10-CM | POA: Diagnosis not present

## 2020-02-17 DIAGNOSIS — I998 Other disorder of circulatory system: Secondary | ICD-10-CM | POA: Diagnosis not present

## 2020-02-17 DIAGNOSIS — I70223 Atherosclerosis of native arteries of extremities with rest pain, bilateral legs: Secondary | ICD-10-CM | POA: Diagnosis present

## 2020-02-17 DIAGNOSIS — I5022 Chronic systolic (congestive) heart failure: Secondary | ICD-10-CM | POA: Diagnosis not present

## 2020-02-17 DIAGNOSIS — I110 Hypertensive heart disease with heart failure: Secondary | ICD-10-CM | POA: Diagnosis present

## 2020-02-17 DIAGNOSIS — E785 Hyperlipidemia, unspecified: Secondary | ICD-10-CM | POA: Diagnosis present

## 2020-02-17 DIAGNOSIS — I5020 Unspecified systolic (congestive) heart failure: Secondary | ICD-10-CM | POA: Diagnosis not present

## 2020-02-17 DIAGNOSIS — Z809 Family history of malignant neoplasm, unspecified: Secondary | ICD-10-CM | POA: Diagnosis not present

## 2020-02-17 DIAGNOSIS — I743 Embolism and thrombosis of arteries of the lower extremities: Secondary | ICD-10-CM | POA: Diagnosis not present

## 2020-02-17 DIAGNOSIS — I252 Old myocardial infarction: Secondary | ICD-10-CM | POA: Diagnosis not present

## 2020-02-17 DIAGNOSIS — F3181 Bipolar II disorder: Secondary | ICD-10-CM | POA: Diagnosis present

## 2020-02-17 DIAGNOSIS — I700 Atherosclerosis of aorta: Secondary | ICD-10-CM | POA: Diagnosis not present

## 2020-02-17 DIAGNOSIS — I82411 Acute embolism and thrombosis of right femoral vein: Secondary | ICD-10-CM | POA: Diagnosis not present

## 2020-02-17 DIAGNOSIS — Z833 Family history of diabetes mellitus: Secondary | ICD-10-CM | POA: Diagnosis not present

## 2020-02-17 DIAGNOSIS — D62 Acute posthemorrhagic anemia: Secondary | ICD-10-CM | POA: Diagnosis not present

## 2020-02-17 DIAGNOSIS — Z955 Presence of coronary angioplasty implant and graft: Secondary | ICD-10-CM | POA: Diagnosis not present

## 2020-02-17 DIAGNOSIS — E1140 Type 2 diabetes mellitus with diabetic neuropathy, unspecified: Secondary | ICD-10-CM | POA: Diagnosis present

## 2020-02-17 DIAGNOSIS — Z8616 Personal history of COVID-19: Secondary | ICD-10-CM | POA: Diagnosis not present

## 2020-02-17 DIAGNOSIS — I5023 Acute on chronic systolic (congestive) heart failure: Secondary | ICD-10-CM | POA: Diagnosis present

## 2020-02-17 DIAGNOSIS — I471 Supraventricular tachycardia: Secondary | ICD-10-CM | POA: Diagnosis not present

## 2020-02-17 DIAGNOSIS — I70229 Atherosclerosis of native arteries of extremities with rest pain, unspecified extremity: Secondary | ICD-10-CM | POA: Diagnosis present

## 2020-02-17 DIAGNOSIS — E876 Hypokalemia: Secondary | ICD-10-CM | POA: Diagnosis not present

## 2020-02-17 DIAGNOSIS — I82412 Acute embolism and thrombosis of left femoral vein: Secondary | ICD-10-CM | POA: Diagnosis not present

## 2020-02-17 DIAGNOSIS — E7800 Pure hypercholesterolemia, unspecified: Secondary | ICD-10-CM | POA: Diagnosis present

## 2020-02-17 DIAGNOSIS — I739 Peripheral vascular disease, unspecified: Secondary | ICD-10-CM | POA: Diagnosis not present

## 2020-02-17 DIAGNOSIS — E1151 Type 2 diabetes mellitus with diabetic peripheral angiopathy without gangrene: Secondary | ICD-10-CM | POA: Diagnosis present

## 2020-02-17 DIAGNOSIS — M79605 Pain in left leg: Secondary | ICD-10-CM | POA: Diagnosis not present

## 2020-02-17 DIAGNOSIS — D509 Iron deficiency anemia, unspecified: Secondary | ICD-10-CM | POA: Diagnosis present

## 2020-02-17 DIAGNOSIS — I70222 Atherosclerosis of native arteries of extremities with rest pain, left leg: Secondary | ICD-10-CM | POA: Diagnosis not present

## 2020-02-17 DIAGNOSIS — F4310 Post-traumatic stress disorder, unspecified: Secondary | ICD-10-CM | POA: Diagnosis present

## 2020-02-17 DIAGNOSIS — N2889 Other specified disorders of kidney and ureter: Secondary | ICD-10-CM | POA: Diagnosis not present

## 2020-02-17 DIAGNOSIS — I10 Essential (primary) hypertension: Secondary | ICD-10-CM | POA: Diagnosis not present

## 2020-02-17 HISTORY — PX: FASCIOTOMY CLOSURE: SHX5829

## 2020-02-17 HISTORY — PX: THROMBECTOMY FEMORAL ARTERY: SHX6406

## 2020-02-17 HISTORY — PX: LOWER EXTREMITY ANGIOGRAM: SHX5508

## 2020-02-17 HISTORY — PX: APPLICATION OF WOUND VAC: SHX5189

## 2020-02-17 HISTORY — PX: FASCIOTOMY: SHX132

## 2020-02-17 LAB — GLUCOSE, CAPILLARY
Glucose-Capillary: 60 mg/dL — ABNORMAL LOW (ref 70–99)
Glucose-Capillary: 67 mg/dL — ABNORMAL LOW (ref 70–99)
Glucose-Capillary: 165 mg/dL — ABNORMAL HIGH (ref 70–99)
Glucose-Capillary: 84 mg/dL (ref 70–99)

## 2020-02-17 LAB — SARS CORONAVIRUS 2 BY RT PCR (HOSPITAL ORDER, PERFORMED IN ~~LOC~~ HOSPITAL LAB): SARS Coronavirus 2: POSITIVE — AB

## 2020-02-17 LAB — HEPARIN LEVEL (UNFRACTIONATED): Heparin Unfractionated: 0.22 IU/mL — ABNORMAL LOW (ref 0.30–0.70)

## 2020-02-17 SURGERY — THROMBECTOMY, ARTERY, FEMORAL
Anesthesia: General | Site: Leg Lower | Laterality: Left

## 2020-02-17 SURGERY — ANGIOGRAM, LOWER EXTREMITY
Anesthesia: General | Site: Leg Lower | Laterality: Left

## 2020-02-17 MED ORDER — HYDRALAZINE HCL 20 MG/ML IJ SOLN
INTRAMUSCULAR | Status: AC
  Filled 2020-02-17: qty 1

## 2020-02-17 MED ORDER — PANTOPRAZOLE SODIUM 40 MG PO TBEC
40.0000 mg | DELAYED_RELEASE_TABLET | Freq: Every day | ORAL | Status: DC
  Administered 2020-02-18 – 2020-02-22 (×5): 40 mg via ORAL
  Filled 2020-02-17 (×5): qty 1

## 2020-02-17 MED ORDER — ACETAMINOPHEN 650 MG RE SUPP: 650.0000 mg | Freq: Four times a day (QID) | RECTAL | Status: DC | PRN

## 2020-02-17 MED ORDER — LACTATED RINGERS IV SOLN: INTRAVENOUS | Status: DC | PRN

## 2020-02-17 MED ORDER — ROCURONIUM BROMIDE 10 MG/ML (PF) SYRINGE
PREFILLED_SYRINGE | INTRAVENOUS | Status: AC
  Filled 2020-02-17: qty 10

## 2020-02-17 MED ORDER — FENTANYL CITRATE (PF) 100 MCG/2ML IJ SOLN
INTRAMUSCULAR | Status: DC | PRN
  Administered 2020-02-17: 100 ug via INTRAVENOUS
  Administered 2020-02-17 (×3): 50 ug via INTRAVENOUS

## 2020-02-17 MED ORDER — 0.9 % SODIUM CHLORIDE (POUR BTL) OPTIME
TOPICAL | Status: DC | PRN
  Administered 2020-02-17: 2000 mL

## 2020-02-17 MED ORDER — MORPHINE SULFATE (PF) 2 MG/ML IV SOLN: 2.0000 mg | INTRAVENOUS | Status: DC | PRN

## 2020-02-17 MED ORDER — FENTANYL CITRATE (PF) 100 MCG/2ML IJ SOLN
INTRAMUSCULAR | Status: DC | PRN
  Administered 2020-02-17 (×3): 50 ug via INTRAVENOUS
  Administered 2020-02-17: 25 ug via INTRAVENOUS

## 2020-02-17 MED ORDER — LABETALOL HCL 5 MG/ML IV SOLN: 10.0000 mg | INTRAVENOUS | Status: DC | PRN

## 2020-02-17 MED ORDER — OXYCODONE HCL 5 MG/5ML PO SOLN: 5.0000 mg | Freq: Once | ORAL | Status: DC | PRN

## 2020-02-17 MED ORDER — PROTAMINE SULFATE 10 MG/ML IV SOLN
INTRAVENOUS | Status: DC | PRN
  Administered 2020-02-17: 50 mg via INTRAVENOUS

## 2020-02-17 MED ORDER — ACETAMINOPHEN 500 MG PO TABS: 1000.0000 mg | ORAL_TABLET | Freq: Once | ORAL | Status: DC | PRN

## 2020-02-17 MED ORDER — FENTANYL CITRATE (PF) 250 MCG/5ML IJ SOLN
INTRAMUSCULAR | Status: AC
  Filled 2020-02-17: qty 5

## 2020-02-17 MED ORDER — HEMOSTATIC AGENTS (NO CHARGE) OPTIME
TOPICAL | Status: DC | PRN
  Administered 2020-02-17: 1 via TOPICAL

## 2020-02-17 MED ORDER — ASPIRIN EC 81 MG PO TBEC
81.0000 mg | DELAYED_RELEASE_TABLET | Freq: Every day | ORAL | Status: DC
  Administered 2020-02-18 – 2020-02-22 (×5): 81 mg via ORAL
  Filled 2020-02-17 (×5): qty 1

## 2020-02-17 MED ORDER — ALBUMIN HUMAN 5 % IV SOLN: INTRAVENOUS | Status: DC | PRN

## 2020-02-17 MED ORDER — PROPOFOL 10 MG/ML IV BOLUS
INTRAVENOUS | Status: AC
  Filled 2020-02-17: qty 40

## 2020-02-17 MED ORDER — CEFAZOLIN SODIUM-DEXTROSE 2-3 GM-%(50ML) IV SOLR
INTRAVENOUS | Status: DC | PRN
  Administered 2020-02-17: 2 g via INTRAVENOUS

## 2020-02-17 MED ORDER — SUGAMMADEX SODIUM 200 MG/2ML IV SOLN
INTRAVENOUS | Status: DC | PRN
  Administered 2020-02-17: 150 mg via INTRAVENOUS

## 2020-02-17 MED ORDER — HYDRALAZINE HCL 20 MG/ML IJ SOLN
5.0000 mg | INTRAMUSCULAR | Status: DC | PRN
  Administered 2020-02-17: 5 mg via INTRAVENOUS
  Filled 2020-02-17: qty 1

## 2020-02-17 MED ORDER — DEXAMETHASONE SODIUM PHOSPHATE 10 MG/ML IJ SOLN
INTRAMUSCULAR | Status: AC
  Filled 2020-02-17: qty 1

## 2020-02-17 MED ORDER — DEXAMETHASONE SODIUM PHOSPHATE 10 MG/ML IJ SOLN
INTRAMUSCULAR | Status: DC | PRN
  Administered 2020-02-17: 5 mg via INTRAVENOUS

## 2020-02-17 MED ORDER — MIDAZOLAM HCL 2 MG/2ML IJ SOLN
INTRAMUSCULAR | Status: AC
  Filled 2020-02-17: qty 2

## 2020-02-17 MED ORDER — SODIUM CHLORIDE 0.9 % IV SOLN
500.0000 mL | Freq: Once | INTRAVENOUS | Status: AC | PRN
  Administered 2020-02-18: 500 mL via INTRAVENOUS

## 2020-02-17 MED ORDER — ROCURONIUM BROMIDE 10 MG/ML (PF) SYRINGE
PREFILLED_SYRINGE | INTRAVENOUS | Status: DC | PRN
  Administered 2020-02-17: 60 mg via INTRAVENOUS
  Administered 2020-02-17: 10 mg via INTRAVENOUS

## 2020-02-17 MED ORDER — OXYCODONE HCL 5 MG PO TABS: 5.0000 mg | ORAL_TABLET | Freq: Once | ORAL | Status: DC | PRN

## 2020-02-17 MED ORDER — ACETAMINOPHEN 10 MG/ML IV SOLN: 1000.0000 mg | Freq: Once | INTRAVENOUS | Status: DC | PRN

## 2020-02-17 MED ORDER — PHENOL 1.4 % MT LIQD: 1.0000 | OROMUCOSAL | Status: DC | PRN

## 2020-02-17 MED ORDER — LIDOCAINE 2% (20 MG/ML) 5 ML SYRINGE
INTRAMUSCULAR | Status: AC
  Filled 2020-02-17: qty 5

## 2020-02-17 MED ORDER — ONDANSETRON HCL 4 MG/2ML IJ SOLN
INTRAMUSCULAR | Status: DC | PRN
  Administered 2020-02-17: 4 mg via INTRAVENOUS

## 2020-02-17 MED ORDER — SODIUM CHLORIDE 0.9 % IV SOLN
INTRAVENOUS | Status: AC
  Filled 2020-02-17: qty 1.2

## 2020-02-17 MED ORDER — FENTANYL CITRATE (PF) 100 MCG/2ML IJ SOLN: 25.0000 ug | INTRAMUSCULAR | Status: DC | PRN

## 2020-02-17 MED ORDER — ACETAMINOPHEN 160 MG/5ML PO SOLN: 1000.0000 mg | Freq: Once | ORAL | Status: DC | PRN

## 2020-02-17 MED ORDER — FENTANYL CITRATE (PF) 100 MCG/2ML IJ SOLN
INTRAMUSCULAR | Status: AC
  Filled 2020-02-17: qty 2

## 2020-02-17 MED ORDER — MIDAZOLAM HCL 2 MG/2ML IJ SOLN
INTRAMUSCULAR | Status: DC | PRN
  Administered 2020-02-17: 2 mg via INTRAVENOUS

## 2020-02-17 MED ORDER — HYDRALAZINE HCL 20 MG/ML IJ SOLN
INTRAMUSCULAR | Status: DC | PRN
Start: 2020-02-17 — End: 2020-02-17
  Administered 2020-02-17 (×2): 5 mg via INTRAVENOUS

## 2020-02-17 MED ORDER — CHLORHEXIDINE GLUCONATE CLOTH 2 % EX PADS
6.0000 | MEDICATED_PAD | Freq: Every day | CUTANEOUS | Status: DC
  Administered 2020-02-17 – 2020-02-22 (×5): 6 via TOPICAL

## 2020-02-17 MED ORDER — DOCUSATE SODIUM 100 MG PO CAPS
100.0000 mg | ORAL_CAPSULE | Freq: Every day | ORAL | Status: DC
  Administered 2020-02-19 – 2020-02-22 (×3): 100 mg via ORAL
  Filled 2020-02-17 (×5): qty 1

## 2020-02-17 MED ORDER — SODIUM CHLORIDE 0.9 % IV SOLN: INTRAVENOUS | Status: AC

## 2020-02-17 MED ORDER — POLYETHYLENE GLYCOL 3350 17 G PO PACK: 17.0000 g | PACK | Freq: Every day | ORAL | Status: DC | PRN

## 2020-02-17 MED ORDER — CITALOPRAM HYDROBROMIDE 20 MG PO TABS
20.0000 mg | ORAL_TABLET | Freq: Two times a day (BID) | ORAL | Status: DC
  Administered 2020-02-18 – 2020-02-22 (×9): 20 mg via ORAL
  Filled 2020-02-17 (×9): qty 1

## 2020-02-17 MED ORDER — GUAIFENESIN-DM 100-10 MG/5ML PO SYRP: 15.0000 mL | ORAL_SOLUTION | ORAL | Status: DC | PRN

## 2020-02-17 MED ORDER — ATORVASTATIN CALCIUM 10 MG PO TABS
20.0000 mg | ORAL_TABLET | Freq: Every day | ORAL | Status: DC
  Administered 2020-02-18: 20 mg via ORAL
  Filled 2020-02-17: qty 2

## 2020-02-17 MED ORDER — PHENYLEPHRINE HCL-NACL 10-0.9 MG/250ML-% IV SOLN
INTRAVENOUS | Status: DC | PRN
  Administered 2020-02-17: 15 ug/min via INTRAVENOUS

## 2020-02-17 MED ORDER — METOPROLOL TARTRATE 5 MG/5ML IV SOLN: 2.0000 mg | INTRAVENOUS | Status: DC | PRN

## 2020-02-17 MED ORDER — PROMETHAZINE HCL 25 MG/ML IJ SOLN: 6.2500 mg | INTRAMUSCULAR | Status: DC | PRN

## 2020-02-17 MED ORDER — HEPARIN BOLUS VIA INFUSION
4000.0000 [IU] | Freq: Once | INTRAVENOUS | Status: AC
  Administered 2020-02-17: 4000 [IU] via INTRAVENOUS
  Filled 2020-02-17: qty 4000

## 2020-02-17 MED ORDER — CEFAZOLIN SODIUM 1 G IJ SOLR
INTRAMUSCULAR | Status: AC
  Filled 2020-02-17: qty 20

## 2020-02-17 MED ORDER — PROPOFOL 10 MG/ML IV BOLUS
INTRAVENOUS | Status: DC | PRN
  Administered 2020-02-17: 160 mg via INTRAVENOUS

## 2020-02-17 MED ORDER — HEPARIN SODIUM (PORCINE) 1000 UNIT/ML IJ SOLN
INTRAMUSCULAR | Status: DC | PRN
  Administered 2020-02-17: 5000 [IU] via INTRAVENOUS

## 2020-02-17 MED ORDER — ALUM & MAG HYDROXIDE-SIMETH 200-200-20 MG/5ML PO SUSP: 15.0000 mL | ORAL | Status: DC | PRN

## 2020-02-17 MED ORDER — TRAZODONE HCL 50 MG PO TABS
50.0000 mg | ORAL_TABLET | Freq: Every evening | ORAL | Status: DC | PRN
  Administered 2020-02-21: 50 mg via ORAL
  Filled 2020-02-17: qty 1

## 2020-02-17 MED ORDER — SODIUM CHLORIDE 0.9 % IV SOLN
INTRAVENOUS | Status: AC
  Filled 2020-02-17 (×2): qty 1.2

## 2020-02-17 MED ORDER — SUCCINYLCHOLINE CHLORIDE 20 MG/ML IJ SOLN
INTRAMUSCULAR | Status: DC | PRN
  Administered 2020-02-17: 120 mg via INTRAVENOUS

## 2020-02-17 MED ORDER — ARIPIPRAZOLE 2 MG PO TABS
5.0000 mg | ORAL_TABLET | Freq: Every day | ORAL | Status: DC
  Administered 2020-02-18 – 2020-02-22 (×5): 5 mg via ORAL
  Filled 2020-02-17: qty 1
  Filled 2020-02-17 (×5): qty 3

## 2020-02-17 MED ORDER — OXYCODONE-ACETAMINOPHEN 5-325 MG PO TABS
1.0000 | ORAL_TABLET | ORAL | Status: DC | PRN
  Administered 2020-02-20: 2 via ORAL
  Administered 2020-02-20: 1 via ORAL
  Administered 2020-02-21: 2 via ORAL
  Filled 2020-02-17: qty 1
  Filled 2020-02-17 (×2): qty 2

## 2020-02-17 MED ORDER — SODIUM CHLORIDE 0.9 % IV SOLN: INTRAVENOUS | Status: DC | PRN

## 2020-02-17 MED ORDER — SODIUM CHLORIDE (PF) 0.9 % IJ SOLN
INTRAMUSCULAR | Status: AC
  Filled 2020-02-17: qty 10

## 2020-02-17 MED ORDER — POTASSIUM CHLORIDE CRYS ER 20 MEQ PO TBCR: 20.0000 meq | EXTENDED_RELEASE_TABLET | Freq: Every day | ORAL | Status: DC | PRN

## 2020-02-17 MED ORDER — SODIUM CHLORIDE (PF) 0.9 % IJ SOLN
INTRAVENOUS | Status: DC | PRN
  Administered 2020-02-17: 77 mL via INTRAMUSCULAR

## 2020-02-17 MED ORDER — IOHEXOL 350 MG/ML SOLN
100.0000 mL | Freq: Once | INTRAVENOUS | Status: AC | PRN
  Administered 2020-02-17: 100 mL via INTRAVENOUS

## 2020-02-17 MED ORDER — CARVEDILOL 12.5 MG PO TABS
12.5000 mg | ORAL_TABLET | Freq: Two times a day (BID) | ORAL | Status: DC
  Administered 2020-02-18 – 2020-02-22 (×10): 12.5 mg via ORAL
  Filled 2020-02-17 (×11): qty 1

## 2020-02-17 MED ORDER — ISOSORBIDE MONONITRATE ER 30 MG PO TB24
30.0000 mg | ORAL_TABLET | Freq: Every day | ORAL | Status: DC
  Administered 2020-02-18 – 2020-02-20 (×3): 30 mg via ORAL
  Filled 2020-02-17 (×3): qty 1

## 2020-02-17 MED ORDER — HEPARIN SODIUM (PORCINE) 1000 UNIT/ML IJ SOLN
INTRAMUSCULAR | Status: DC | PRN
  Administered 2020-02-17: 8000 [IU] via INTRAVENOUS

## 2020-02-17 MED ORDER — ACETAMINOPHEN 325 MG PO TABS
650.0000 mg | ORAL_TABLET | Freq: Four times a day (QID) | ORAL | Status: DC | PRN
  Administered 2020-02-18: 650 mg via ORAL
  Filled 2020-02-17: qty 2

## 2020-02-17 MED ORDER — ROCURONIUM BROMIDE 10 MG/ML (PF) SYRINGE
PREFILLED_SYRINGE | INTRAVENOUS | Status: DC | PRN
  Administered 2020-02-17: 20 mg via INTRAVENOUS
  Administered 2020-02-17: 40 mg via INTRAVENOUS
  Administered 2020-02-17 (×2): 20 mg via INTRAVENOUS

## 2020-02-17 MED ORDER — HEPARIN (PORCINE) 25000 UT/250ML-% IV SOLN
600.0000 [IU]/h | INTRAVENOUS | Status: DC
  Administered 2020-02-17: 600 [IU]/h via INTRAVENOUS

## 2020-02-17 MED ORDER — HYDRALAZINE HCL 20 MG/ML IJ SOLN: 10.0000 mg | Freq: Once | INTRAMUSCULAR | Status: DC

## 2020-02-17 MED ORDER — PHENYLEPHRINE 40 MCG/ML (10ML) SYRINGE FOR IV PUSH (FOR BLOOD PRESSURE SUPPORT)
PREFILLED_SYRINGE | INTRAVENOUS | Status: DC | PRN
  Administered 2020-02-17 (×2): 80 ug via INTRAVENOUS
  Administered 2020-02-17 (×2): 120 ug via INTRAVENOUS

## 2020-02-17 MED ORDER — HEPARIN (PORCINE) 25000 UT/250ML-% IV SOLN
1100.0000 [IU]/h | INTRAVENOUS | Status: DC
  Administered 2020-02-17 (×2): 1100 [IU]/h via INTRAVENOUS
  Filled 2020-02-17: qty 250

## 2020-02-17 MED ORDER — SUGAMMADEX SODIUM 200 MG/2ML IV SOLN
INTRAVENOUS | Status: DC | PRN
  Administered 2020-02-17: 180 mg via INTRAVENOUS

## 2020-02-17 MED ORDER — ONDANSETRON HCL 4 MG/2ML IJ SOLN: 4.0000 mg | Freq: Four times a day (QID) | INTRAMUSCULAR | Status: DC | PRN

## 2020-02-17 MED ORDER — PAPAVERINE HCL 30 MG/ML IJ SOLN
INTRAMUSCULAR | Status: AC
  Filled 2020-02-17: qty 2

## 2020-02-17 MED ORDER — PROPOFOL 10 MG/ML IV BOLUS
INTRAVENOUS | Status: DC | PRN
  Administered 2020-02-17: 110 mg via INTRAVENOUS

## 2020-02-17 MED ORDER — PROPOFOL 10 MG/ML IV BOLUS
INTRAVENOUS | Status: AC
  Filled 2020-02-17: qty 20

## 2020-02-17 MED ORDER — SODIUM CHLORIDE (PF) 0.9 % IJ SOLN
INTRAVENOUS | Status: DC | PRN
  Administered 2020-02-17: 50 mL via INTRAMUSCULAR

## 2020-02-17 MED ORDER — MAGNESIUM SULFATE 2 GM/50ML IV SOLN: 2.0000 g | Freq: Every day | INTRAVENOUS | Status: DC | PRN

## 2020-02-17 MED ORDER — HYDRALAZINE HCL 20 MG/ML IJ SOLN
INTRAMUSCULAR | Status: AC
  Administered 2020-02-17: 10 mg
  Filled 2020-02-17: qty 1

## 2020-02-17 MED ORDER — ONDANSETRON HCL 4 MG/2ML IJ SOLN
INTRAMUSCULAR | Status: AC
  Filled 2020-02-17: qty 2

## 2020-02-17 MED ORDER — DEXAMETHASONE SODIUM PHOSPHATE 10 MG/ML IJ SOLN
INTRAMUSCULAR | Status: DC | PRN
Start: 2020-02-17 — End: 2020-02-17
  Administered 2020-02-17: 4 mg via INTRAVENOUS

## 2020-02-17 MED ORDER — CEFAZOLIN SODIUM-DEXTROSE 2-4 GM/100ML-% IV SOLN
2.0000 g | Freq: Three times a day (TID) | INTRAVENOUS | Status: AC
  Administered 2020-02-17: 2 g via INTRAVENOUS
  Filled 2020-02-17: qty 100

## 2020-02-17 MED ORDER — DEXTROSE 50 % IV SOLN
INTRAVENOUS | Status: AC
  Administered 2020-02-17: 25 mL via INTRAVENOUS
  Filled 2020-02-17: qty 50

## 2020-02-17 MED ORDER — MIDAZOLAM HCL 5 MG/5ML IJ SOLN
INTRAMUSCULAR | Status: DC | PRN
  Administered 2020-02-17: 1 mg via INTRAVENOUS

## 2020-02-17 MED ORDER — DEXTROSE 50 % IV SOLN: 0.5000 | Freq: Once | INTRAVENOUS | Status: AC

## 2020-02-17 MED ORDER — PHENYLEPHRINE HCL-NACL 10-0.9 MG/250ML-% IV SOLN
INTRAVENOUS | Status: DC | PRN
  Administered 2020-02-17: 25 ug/min via INTRAVENOUS

## 2020-02-17 MED ORDER — FENTANYL CITRATE (PF) 100 MCG/2ML IJ SOLN
25.0000 ug | INTRAMUSCULAR | Status: DC | PRN
  Administered 2020-02-17: 25 ug via INTRAVENOUS

## 2020-02-17 SURGICAL SUPPLY — 114 items
ADH SKN CLS APL DERMABOND .7 (GAUZE/BANDAGES/DRESSINGS) ×2
APL PRP STRL LF DISP 70% ISPRP (MISCELLANEOUS)
APPLIER CLIP 11 MED OPEN (CLIP) ×3
APR CLP MED 11 20 MLT OPN (CLIP) ×2
BAG BANDED W/RUBBER/TAPE 36X54 (MISCELLANEOUS) ×3 IMPLANT
BAG EQP BAND 135X91 W/RBR TAPE (MISCELLANEOUS) ×2
BAG SNAP BAND KOVER 36X36 (MISCELLANEOUS) ×4 IMPLANT
BALLN MUSTANG 4X100X135 (BALLOONS) ×3
BALLN MUSTANG 5X100X135 (BALLOONS) ×3
BALLN STERLING OTW 3X150X150 (BALLOONS) ×3
BALLN STERLING OTW 3X60X150 (BALLOONS) ×3
BALLOON MUSTANG 4X100X135 (BALLOONS) IMPLANT
BALLOON MUSTANG 5X100X135 (BALLOONS) IMPLANT
BALLOON STERLING OTW 3X150X150 (BALLOONS) IMPLANT
BALLOON STERLING OTW 3X60X150 (BALLOONS) IMPLANT
BANDAGE ESMARK 6X9 LF (GAUZE/BANDAGES/DRESSINGS) IMPLANT
BLADE SURG 11 STRL SS (BLADE) ×3 IMPLANT
BNDG CMPR 9X6 STRL LF SNTH (GAUZE/BANDAGES/DRESSINGS)
BNDG ESMARK 6X9 LF (GAUZE/BANDAGES/DRESSINGS)
CANISTER SUCT 3000ML PPV (MISCELLANEOUS) ×3 IMPLANT
CANNULA VESSEL 3MM 2 BLNT TIP (CANNULA) ×3 IMPLANT
CATH ANGIO 5F BER 65CM (CATHETERS) ×1 IMPLANT
CATH ANGIO 5F BER2 65CM (CATHETERS) IMPLANT
CATH OMNI FLUSH .035X70CM (CATHETERS) IMPLANT
CATH QUICKCROSS SUPP .035X90CM (MICROCATHETER) ×1 IMPLANT
CHLORAPREP W/TINT 26 (MISCELLANEOUS) IMPLANT
CLIP APPLIE 11 MED OPEN (CLIP) IMPLANT
CLIP VESOCCLUDE MED 24/CT (CLIP) ×3 IMPLANT
CLIP VESOCCLUDE SM WIDE 24/CT (CLIP) ×4 IMPLANT
COVER DOME SNAP 22 D (MISCELLANEOUS) ×4 IMPLANT
COVER PROBE W GEL 5X96 (DRAPES) ×3 IMPLANT
COVER SURGICAL LIGHT HANDLE (MISCELLANEOUS) ×3 IMPLANT
COVER WAND RF STERILE (DRAPES) ×3 IMPLANT
CUFF TOURN SGL QUICK 24 (TOURNIQUET CUFF)
CUFF TOURN SGL QUICK 34 (TOURNIQUET CUFF)
CUFF TOURN SGL QUICK 42 (TOURNIQUET CUFF) IMPLANT
CUFF TRNQT CYL 24X4X16.5-23 (TOURNIQUET CUFF) IMPLANT
CUFF TRNQT CYL 34X4.125X (TOURNIQUET CUFF) IMPLANT
DERMABOND ADVANCED (GAUZE/BANDAGES/DRESSINGS) ×1
DERMABOND ADVANCED .7 DNX12 (GAUZE/BANDAGES/DRESSINGS) ×4 IMPLANT
DEVICE TORQUE H2O (MISCELLANEOUS) ×1 IMPLANT
DRAIN CHANNEL 15F RND FF W/TCR (WOUND CARE) IMPLANT
DRAPE FEMORAL ANGIO 80X135IN (DRAPES) ×3 IMPLANT
DRAPE HALF SHEET 40X57 (DRAPES) ×1 IMPLANT
DRAPE X-RAY CASS 24X20 (DRAPES) IMPLANT
DRSG COVADERM 4X6 (GAUZE/BANDAGES/DRESSINGS) ×1 IMPLANT
DRSG COVADERM 4X8 (GAUZE/BANDAGES/DRESSINGS) ×1 IMPLANT
DRSG TEGADERM 2-3/8X2-3/4 SM (GAUZE/BANDAGES/DRESSINGS) ×2 IMPLANT
DRSG TEGADERM 4X4.75 (GAUZE/BANDAGES/DRESSINGS) ×2 IMPLANT
ELECT REM PT RETURN 9FT ADLT (ELECTROSURGICAL) ×3
ELECTRODE REM PT RTRN 9FT ADLT (ELECTROSURGICAL) ×2 IMPLANT
EVACUATOR SILICONE 100CC (DRAIN) IMPLANT
GAUZE 4X4 16PLY RFD (DISPOSABLE) ×3 IMPLANT
GLOVE BIOGEL PI IND STRL 7.5 (GLOVE) ×2 IMPLANT
GLOVE BIOGEL PI INDICATOR 7.5 (GLOVE) ×1
GLOVE SURG SS PI 7.5 STRL IVOR (GLOVE) ×3 IMPLANT
GOWN STRL REUS W/ TWL LRG LVL3 (GOWN DISPOSABLE) ×4 IMPLANT
GOWN STRL REUS W/ TWL XL LVL3 (GOWN DISPOSABLE) ×2 IMPLANT
GOWN STRL REUS W/TWL LRG LVL3 (GOWN DISPOSABLE) ×6
GOWN STRL REUS W/TWL XL LVL3 (GOWN DISPOSABLE) ×3
GUIDEWIRE ANGLED .035X150CM (WIRE) ×1 IMPLANT
GUIDEWIRE BENTSON (WIRE) ×1 IMPLANT
HEMOSTAT SNOW SURGICEL 2X4 (HEMOSTASIS) IMPLANT
INSERT FOGARTY SM (MISCELLANEOUS) IMPLANT
KIT BASIN OR (CUSTOM PROCEDURE TRAY) ×3 IMPLANT
KIT ENCORE 26 ADVANTAGE (KITS) ×1 IMPLANT
KIT TURNOVER KIT B (KITS) ×3 IMPLANT
MARKER GRAFT CORONARY BYPASS (MISCELLANEOUS) IMPLANT
NDL PERC 18GX7CM (NEEDLE) ×2 IMPLANT
NEEDLE PERC 18GX7CM (NEEDLE) ×3 IMPLANT
NS IRRIG 1000ML POUR BTL (IV SOLUTION) ×6 IMPLANT
PACK PERIPHERAL VASCULAR (CUSTOM PROCEDURE TRAY) ×3 IMPLANT
PACK SURGICAL SETUP 50X90 (CUSTOM PROCEDURE TRAY) ×3 IMPLANT
PAD ARMBOARD 7.5X6 YLW CONV (MISCELLANEOUS) ×6 IMPLANT
PROTECTION STATION PRESSURIZED (MISCELLANEOUS) ×3
SET COLLECT BLD 21X3/4 12 (NEEDLE) IMPLANT
SET MICROPUNCTURE 5F STIFF (MISCELLANEOUS) ×4 IMPLANT
SHEATH AVANTI 11CM 5FR (SHEATH) IMPLANT
SHEATH PINNACLE ST 7F 45CM (SHEATH) ×1 IMPLANT
SHIELD RADPAD SCOOP 12X17 (MISCELLANEOUS) ×1 IMPLANT
SLEEVE SURGEON STRL (DRAPES) ×1 IMPLANT
STATION PROTECTION PRESSURIZED (MISCELLANEOUS) ×2 IMPLANT
STENT ELUVIA 6X60X130 (Permanent Stent) ×1 IMPLANT
STENT TIGRIS 5X100X120 (Permanent Stent) ×1 IMPLANT
STOPCOCK 4 WAY LG BORE MALE ST (IV SETS) IMPLANT
STOPCOCK MORSE 400PSI 3WAY (MISCELLANEOUS) ×4 IMPLANT
SUT ETHILON 3 0 PS 1 (SUTURE) IMPLANT
SUT GORETEX 6.0 TT13 (SUTURE) IMPLANT
SUT GORETEX 6.0 TT9 (SUTURE) IMPLANT
SUT PROLENE 5 0 C 1 24 (SUTURE) ×5 IMPLANT
SUT PROLENE 6 0 BV (SUTURE) ×5 IMPLANT
SUT PROLENE 7 0 BV 1 (SUTURE) IMPLANT
SUT SILK 2 0 SH (SUTURE) ×3 IMPLANT
SUT SILK 3 0 (SUTURE)
SUT SILK 3-0 18XBRD TIE 12 (SUTURE) IMPLANT
SUT VIC AB 2-0 CT1 27 (SUTURE) ×9
SUT VIC AB 2-0 CT1 TAPERPNT 27 (SUTURE) ×4 IMPLANT
SUT VIC AB 3-0 SH 27 (SUTURE) ×9
SUT VIC AB 3-0 SH 27X BRD (SUTURE) ×4 IMPLANT
SUT VICRYL 4-0 PS2 18IN ABS (SUTURE) ×6 IMPLANT
SYR 10ML LL (SYRINGE) ×13 IMPLANT
SYR 20ML LL LF (SYRINGE) ×4 IMPLANT
SYR 30ML LL (SYRINGE) ×3 IMPLANT
SYR MEDRAD MARK V 150ML (SYRINGE) IMPLANT
TOWEL GREEN STERILE (TOWEL DISPOSABLE) ×6 IMPLANT
TRAY FOLEY MTR SLVR 16FR STAT (SET/KITS/TRAYS/PACK) ×3 IMPLANT
TUBING EXTENTION W/L.L. (IV SETS) IMPLANT
TUBING HIGH PRESSURE 120CM (CONNECTOR) ×3 IMPLANT
TUBING INJECTOR 48 (MISCELLANEOUS) ×1 IMPLANT
UNDERPAD 30X36 HEAVY ABSORB (UNDERPADS AND DIAPERS) ×3 IMPLANT
WATER STERILE IRR 1000ML POUR (IV SOLUTION) ×3 IMPLANT
WIRE BENTSON .035X145CM (WIRE) ×3 IMPLANT
WIRE G V18X300CM (WIRE) ×1 IMPLANT
WIRE HI TORQ VERSACORE J 260CM (WIRE) ×2 IMPLANT

## 2020-02-17 SURGICAL SUPPLY — 73 items
ADH SKN CLS APL DERMABOND .7 (GAUZE/BANDAGES/DRESSINGS) ×4
BANDAGE ESMARK 6X9 LF (GAUZE/BANDAGES/DRESSINGS) IMPLANT
BNDG CMPR 9X6 STRL LF SNTH (GAUZE/BANDAGES/DRESSINGS)
BNDG ELASTIC 4X5.8 VLCR STR LF (GAUZE/BANDAGES/DRESSINGS) IMPLANT
BNDG ESMARK 6X9 LF (GAUZE/BANDAGES/DRESSINGS)
CANISTER SUCT 3000ML PPV (MISCELLANEOUS) ×3 IMPLANT
CANISTER WOUNDNEG PRESSURE 500 (CANNISTER) ×1 IMPLANT
CATH EMB 3FR 80CM (CATHETERS) ×1 IMPLANT
CATH EMB 4FR 80CM (CATHETERS) ×3 IMPLANT
CLIP VESOCCLUDE MED 24/CT (CLIP) ×3 IMPLANT
CLIP VESOCCLUDE SM WIDE 24/CT (CLIP) ×3 IMPLANT
CNTNR URN SCR LID CUP LEK RST (MISCELLANEOUS) IMPLANT
CONNECTOR Y ATS VAC SYSTEM (MISCELLANEOUS) ×1 IMPLANT
CONT SPEC 4OZ STRL OR WHT (MISCELLANEOUS) ×6
COVER WAND RF STERILE (DRAPES) ×3 IMPLANT
CUFF TOURN SGL QUICK 24 (TOURNIQUET CUFF)
CUFF TOURN SGL QUICK 34 (TOURNIQUET CUFF)
CUFF TOURN SGL QUICK 42 (TOURNIQUET CUFF) IMPLANT
CUFF TRNQT CYL 24X4X16.5-23 (TOURNIQUET CUFF) IMPLANT
CUFF TRNQT CYL 34X4.125X (TOURNIQUET CUFF) IMPLANT
DERMABOND ADVANCED (GAUZE/BANDAGES/DRESSINGS) ×2
DERMABOND ADVANCED .7 DNX12 (GAUZE/BANDAGES/DRESSINGS) ×2 IMPLANT
DRAIN CHANNEL 15F RND FF W/TCR (WOUND CARE) IMPLANT
DRAPE C-ARM 42X72 X-RAY (DRAPES) ×1 IMPLANT
DRAPE X-RAY CASS 24X20 (DRAPES) IMPLANT
DRSG VAC ATS MED SENSATRAC (GAUZE/BANDAGES/DRESSINGS) ×1 IMPLANT
ELECT CAUTERY BLADE 6.4 (BLADE) ×1 IMPLANT
ELECT REM PT RETURN 9FT ADLT (ELECTROSURGICAL) ×3
ELECTRODE REM PT RTRN 9FT ADLT (ELECTROSURGICAL) ×2 IMPLANT
EVACUATOR SILICONE 100CC (DRAIN) IMPLANT
GLOVE BIO SURGEON STRL SZ7 (GLOVE) ×1 IMPLANT
GLOVE BIO SURGEON STRL SZ7.5 (GLOVE) ×1 IMPLANT
GLOVE BIOGEL PI IND STRL 7.0 (GLOVE) IMPLANT
GLOVE BIOGEL PI IND STRL 7.5 (GLOVE) ×2 IMPLANT
GLOVE BIOGEL PI INDICATOR 7.0 (GLOVE) ×4
GLOVE BIOGEL PI INDICATOR 7.5 (GLOVE) ×2
GLOVE SURG SS PI 7.5 STRL IVOR (GLOVE) ×5 IMPLANT
GOWN STRL REUS W/ TWL LRG LVL3 (GOWN DISPOSABLE) ×4 IMPLANT
GOWN STRL REUS W/ TWL XL LVL3 (GOWN DISPOSABLE) ×2 IMPLANT
GOWN STRL REUS W/TWL LRG LVL3 (GOWN DISPOSABLE) ×15
GOWN STRL REUS W/TWL XL LVL3 (GOWN DISPOSABLE) ×3
HEMOSTAT SNOW SURGICEL 2X4 (HEMOSTASIS) ×1 IMPLANT
KIT BASIN OR (CUSTOM PROCEDURE TRAY) ×3 IMPLANT
KIT TURNOVER KIT B (KITS) ×3 IMPLANT
LOOP VESSEL MINI RED (MISCELLANEOUS) ×1 IMPLANT
MARKER GRAFT CORONARY BYPASS (MISCELLANEOUS) IMPLANT
NS IRRIG 1000ML POUR BTL (IV SOLUTION) ×6 IMPLANT
PACK PERIPHERAL VASCULAR (CUSTOM PROCEDURE TRAY) ×3 IMPLANT
PAD ARMBOARD 7.5X6 YLW CONV (MISCELLANEOUS) ×6 IMPLANT
PENCIL BUTTON HOLSTER BLD 10FT (ELECTRODE) ×1 IMPLANT
SET COLLECT BLD 21X3/4 12 (NEEDLE) ×1 IMPLANT
SET MICROPUNCTURE 5F STIFF (MISCELLANEOUS) ×2 IMPLANT
SPONGE LAP 18X18 X RAY DECT (DISPOSABLE) ×1 IMPLANT
STOPCOCK 4 WAY LG BORE MALE ST (IV SETS) ×1 IMPLANT
SUT ETHILON 3 0 PS 1 (SUTURE) IMPLANT
SUT PROLENE 5 0 C 1 24 (SUTURE) ×8 IMPLANT
SUT PROLENE 6 0 BV (SUTURE) ×3 IMPLANT
SUT PROLENE 7 0 BV 1 (SUTURE) IMPLANT
SUT SILK 2 0 SH (SUTURE) ×3 IMPLANT
SUT SILK 3 0 (SUTURE)
SUT SILK 3-0 18XBRD TIE 12 (SUTURE) IMPLANT
SUT VIC AB 2-0 CT1 27 (SUTURE) ×6
SUT VIC AB 2-0 CT1 TAPERPNT 27 (SUTURE) ×4 IMPLANT
SUT VIC AB 3-0 SH 27 (SUTURE) ×6
SUT VIC AB 3-0 SH 27X BRD (SUTURE) ×4 IMPLANT
SUT VICRYL 4-0 PS2 18IN ABS (SUTURE) ×6 IMPLANT
SYR 20ML LL LF (SYRINGE) ×1 IMPLANT
SYR 3ML LL SCALE MARK (SYRINGE) ×2 IMPLANT
TOWEL GREEN STERILE (TOWEL DISPOSABLE) ×3 IMPLANT
TRAY FOLEY MTR SLVR 16FR STAT (SET/KITS/TRAYS/PACK) ×3 IMPLANT
TUBING EXTENTION W/L.L. (IV SETS) ×1 IMPLANT
UNDERPAD 30X36 HEAVY ABSORB (UNDERPADS AND DIAPERS) ×3 IMPLANT
WATER STERILE IRR 1000ML POUR (IV SOLUTION) ×3 IMPLANT

## 2020-02-17 NOTE — Anesthesia Procedure Notes (Signed)
Procedure Name: Intubation Date/Time: 02/17/2020 6:48 PM Performed by: Edmonia Caprio, CRNA Pre-anesthesia Checklist: Patient identified, Emergency Drugs available, Patient being monitored, Timeout performed and Suction available Patient Re-evaluated:Patient Re-evaluated prior to induction Oxygen Delivery Method: Circle system utilized Preoxygenation: Pre-oxygenation with 100% oxygen Induction Type: IV induction Ventilation: Mask ventilation without difficulty Laryngoscope Size: Glidescope and 3 Grade View: Grade I Tube type: Oral Tube size: 7.5 mm Number of attempts: 1 Airway Equipment and Method: Stylet and Video-laryngoscopy Placement Confirmation: ETT inserted through vocal cords under direct vision,  positive ETCO2 and breath sounds checked- equal and bilateral Secured at: 23 cm Tube secured with: Tape Dental Injury: Teeth and Oropharynx as per pre-operative assessment  Comments: Glidescope due to Covid + status

## 2020-02-17 NOTE — Op Note (Signed)
Patient name: Paul [name]. MRN: [id number] DOB: 21-Jun-1971 Sex: male  02/17/2020 Pre-operative Diagnosis: Recurrent left leg ischemia Post-operative diagnosis:  Same Surgeon:  Durene Cal Assistants:  none Procedure:   #1:  Redo exposure of left femoral artery   #2:  Stent left popliteal artery   #3:  Angioplasty left posterior tibial artery and tibioperoneal trunk   #4:  Intra-arterial administration of nitroglycerin   #5:  Closure of left leg fasciotomy Anesthesia:  General Blood Loss:  300 cc Specimens:  none  Findings:  Successful recanalization of left popliteal artery and posterior tibial artery  Indications: The patient presented with profound ischemia of his left leg that has been going on for a few days.  CT scan imaging revealed thrombus within bilateral femoral arteries.  He underwent bilateral femoral endarterectomy this morning.  He was found to have chronic occlusions of both popliteal arteries.  Fasciotomies were performed on the left leg.  He had a signal in the left posterior tibial artery after the procedure however this disappeared and he was having continuing difficulty and so he comes back to the operating room for an attempt at percutaneous revascularization versus surgical bypass.  Procedure:  The patient was identified in the holding area and taken to [facility] OR ROOM 15  The patient was then placed supine on the table. general anesthesia was administered.  The patient was prepped and draped in the usual sterile fashion.  A time out was called and antibiotics were administered.  The patient's previous longitudinal left femoral incision was reopened.  Sutures were removed and the left common femoral artery was exposed.  The left femoral artery was then cannulated with a micropuncture needle.  An 018 wire was advanced and a micropuncture sheath was placed.  Over an 035 wire a 7 French 45 cm sheath was inserted.  The patient was fully heparinized.  Subintimal  recanalization was then performed using an 035 Glidewire and a quick cross.  Reentry was obtained in the distal below-knee popliteal artery.  An 035 versa core wire was then inserted into the posterior tibial artery.  The subintimal tract was then predilated with a 4 mm Mustang balloon.  I then elected to primarily stent the popliteal artery using a 5 x 100 Tigris stent.  This was postdilated with a 4 mm balloon.  An arteriogram was performed which showed persistent occlusion just proximal to the previously placed stent and so I extended proximally with a 6 x 60 Elluvia stent.  The [place] stent was postdilated with a 5 mm balloon.  Follow-up imaging revealed inline flow through the superficial femoral popliteal and posterior tibial artery however there did appear to be luminal irregularity within the tibioperoneal trunk and posterior tibial artery.  Using a quick cross catheter I switched out the 035 wire for a 018 wire and then used a 3 x 150 Sterling balloon to perform balloon angioplasty of the posterior tibial artery as well as the tibioperoneal trunk.  I also administered 800 mcg nitroglycerin through the quick cross catheter into the posterior tibial artery.  Completion imaging was then performed which showed inline flow through the superficial femoral, popliteal artery, and posterior tibial artery.  The sheath was removed and the arteriotomy was closed with 5-0 Prolene.  The heparin was reversed.  Once hemostasis was satisfactory I reapproximated the femoral sheath with 2-0 Vicryl.  The subtenons tissue was then closed with additional layers of 2-0 and 3-0 Vicryl and subcuticular skin closure  was performed.  I then elected to close the skin of the fasciotomy incisions on the medial and lateral aspect of the left leg with staples.  All muscle appeared viable.  The patient had a triphasic posterior tibial Doppler signal at the ankle.  Sterile dressings were applied.  Dermabond was placed on the  femoral incision.  He was successfully extubated.   Disposition: To recovery in stable condition   V. Durene Cal, M.D., [facility] [place] Office: [phone] Pager:  [phone]

## 2020-02-17 NOTE — Anesthesia Preprocedure Evaluation (Addendum)
Anesthesia Evaluation    Reviewed: Allergy & Precautions, Patient's Chart, lab work & pertinent test results  History of Anesthesia Complications Negative for: history of anesthetic complications  Airway Mallampati: II  TM Distance: >3 FB Neck ROM: Full    Dental  (+) Dental Advisory Given   Pulmonary   Hx TB 1989 - treated    breath sounds clear to auscultation       Cardiovascular hypertension, Pt. on medications and Pt. on home beta blockers + CAD, + Past MI, + Cardiac Stents and + Peripheral Vascular Disease   Rhythm:Regular   '19 TTE - Septal and apical akinesis. LV cavity size was mildly dilated. Mild LVH. EF 40% to 45%. Mild TR and PR.     Neuro/Psych PSYCHIATRIC DISORDERS Anxiety Bipolar Disorder    GI/Hepatic   Endo/Other  diabetes, Type 2, Oral Hypoglycemic Agents, Insulin Dependent Hypokalemia, K 3.1   Renal/GU      Musculoskeletal   Abdominal   Peds  Hematology   Anesthesia Other Findings Covid Positive (had + test in March 2021 as well)   Reproductive/Obstetrics                         Anesthesia Physical Anesthesia Plan  ASA: III and emergent  Anesthesia Plan: General   Post-op Pain Management:    Induction: Intravenous  PONV Risk Score and Plan: 3 and Treatment may vary due to age or medical condition, Ondansetron, Dexamethasone and Midazolam  Airway Management Planned: Oral ETT  Additional Equipment:   Intra-op Plan:   Post-operative Plan: Extubation in OR  Informed Consent:     Only emergency history available and History available from chart only  Plan Discussed with: CRNA and Anesthesiologist  Anesthesia Plan Comments:         Anesthesia Quick Evaluation

## 2020-02-17 NOTE — Transfer of Care (Signed)
Immediate Anesthesia Transfer of Care Note  Patient: Paul [name].  Procedure(s) Performed: Bilateral  Iliofemoral Thrombectomy and exposure of left lower leg. (Bilateral Leg Lower) LOWER EXTREMITY ANGIOGRAM (Bilateral Leg Lower) APPLICATION OF WOUND VAC (Left Leg Lower) Four Compartment FASCIOTOMY (Left Leg Lower)  Patient Location: PACU  Anesthesia Type:General  Level of Consciousness: awake  Airway & Oxygen Therapy: Patient Spontanous Breathing  Post-op Assessment: Report given to RN  Post vital signs: Reviewed and stable  Last Vitals:  Vitals Value Taken Time  BP 116/84 02/17/20 1206  Temp    Pulse    Resp 20 02/17/20 1209  SpO2    Vitals shown include unvalidated device data.  Last Pain:  Vitals:   02/17/20 0014  TempSrc: Oral  PainSc:          Complications: No complications documented.

## 2020-02-17 NOTE — H&P (Addendum)
Patient seen earlier today. Late note entry. I will cosign the resident's note once it is completed.  Brief assessment: 49 Y/O with PMX of  PTSD, TB, HTN, DM2, CAD, and bipolar presented with hx of lower extremity weakness for two days. He his s/p emergent vascular surgery and recovering from anesthesia at the time I evaluated him. Dr. Faylene Million, the vascular surgeon, was present, and he discussed his prognosis with me as well.  Exam: Gen: Drowsy but will respond intermittently to questions. No acute distress Neuro: Unable to assess at this time. Heart: S1 S2 normal, no murmur. RRR. Resp: Air entry equal and CTA B/L. Abd: Soft, NT/ND, BS+ and normal. Ext: Surgical wound on his left LL clean, wound vac in place. Dorsalis pedis difficult to palpate B/L. However, bedside doppler US done by the RN picked up a faint posterior tibial art B/L. No leg edema.  A/P: Critical limb secondary to Bilateral femoral thrombus S/P thrombectomy/revascularization surgery. Per the vascular surgeon, they might need to take him to the OR to repeat his procedure due to the severity. They will likely attempt arterial stenting at the time, if possible, to salvage his limb. In the meantime, we will anticoagulate him with heparin pending further recommendations from vascular.  Left Renal mass of CTA: We will obtain renal MRI to r/o malignancy once stable from the vascular standpoint.  For his other chronic problems, please review the resident's documentation below.

## 2020-02-17 NOTE — Anesthesia Postprocedure Evaluation (Signed)
Anesthesia Post Note  Patient: Paul [name].  Procedure(s) Performed: Bilateral  Iliofemoral Thrombectomy and exposure of left lower leg. (Bilateral Leg Lower) LOWER EXTREMITY ANGIOGRAM (Bilateral Leg Lower) APPLICATION OF WOUND VAC (Left Leg Lower) Four Compartment FASCIOTOMY (Left Leg Lower)     Patient location during evaluation: PACU Anesthesia Type: General Level of consciousness: awake and alert Pain management: pain level controlled Vital Signs Assessment: post-procedure vital signs reviewed and stable Respiratory status: spontaneous breathing, nonlabored ventilation, respiratory function stable and patient connected to nasal cannula oxygen Cardiovascular status: blood pressure returned to baseline and stable Postop Assessment: no apparent nausea or vomiting Anesthetic complications: no   No complications documented.  Last Vitals:  Vitals:   02/17/20 1405 02/17/20 1700  BP: (!) 166/109 (!) 142/92  Pulse:    Resp: 16 20  Temp:    SpO2: 97%     Last Pain:  Vitals:   02/17/20 1350  TempSrc:   PainSc: 8                  [name]

## 2020-02-17 NOTE — H&P (Signed)
[facility] Admission History and Physical Service Pager: [phone]  Patient name: Paul [name]. Medical record number: [id number] Date of birth: August 19, 1970 Age: 49 y.o. Gender: male  Primary Care Provider: Cleophas Dunker, DO Consultants: Vascular surgery Code Status: Full Preferred Emergency Contact: Ms. erica, osuna, [phone]  Chief Complaint: "Leg weakness/numbness"  Assessment and Plan: Paul [name]. is a 49 y.o. male presenting with worsening leg weakness and numbness, found to have critical limb ischemia.  PMH is significant for T2DM, CAD, hypertension, COVID-19 illness in 10/2019, hyperlipidemia, and cardiomyopathy.  Critical limb ischemia, s/p thrombectomy: Acute.  Reported sudden onset weakness/numbness of BLE since 7/8, unable to identify PT/DP pulses via Doppler with cool extremities in the ED. Following CTA showing nearly occlusive thrombosis within bilateral femoral arteries, Dr. Trula Slade with vascular surgery opted for emergent bilateral thrombectomy with left fasciotomy and wound VAC placement earlier today. Unfortunately, revascularization and return of neurological function on the left was not achieved, thus Dr. Trula Slade will take him back to the OR tonight, 7/10, to attempt stenting vs bypass graft.  May ultimately need amputation. -Admit to progressive, attending Dr. Gwendlyn Deutscher -Vascular surgery on board, appreciate assistance and will follow recommendations -Heparin gtt -Will likely need long-term anticoagulation -Percocet for moderate pain, morphine for severe  L renal mass seen on CT: Acute. 2.3X 1.6 cm masslike lesion within lower left kidney pole, which is suspicious for neoplastic mass.  Additionally reported could represent edema related to pyelonephritis vs infarct, however no urinary symptoms.  Cr 1.19, at his baseline. -Will further characterize with MRI when medically appropriate  Recent COVID-19 illness:  Stable. Covid PCR on admit still positive, do not have a negative test in between known Covid illness requiring hospitalization in 10/2019.  Currently asymptomatic, suspect continuation from illness several months ago.  May contribute to hypercoagulable state leading to critical limb ischemia as above. -Should not need precautions  T2DM: Chronic, stable. CBG 126 on admit.  A1c 7.7 in 01/2020.  Takes Metformin, Jardiance, and Lantus 15 U at home. -SSI -CBGs with meals/nightly -Monitor BMP -Hold home medications as above, can consider adding Lantus if CBGs uncontrolled  Hypertension: Chronic, stable.  Systolic 623-762G.  Takes Imdur, Coreg, and quinapril-HCTZ at home. -Monitor BP -Continue home Imdur and Coreg -Will restart Enalapril-HCTZ if needed for further control  Bipolar II disorder: Chronic, stable. Mood appropriate.  Takes Celexa and Abilify. -Continue home regimen  CAD  HFmrEF w/ EF 40-45%: Chronic, stable. Asymptomatic.  Follows with Dr. Percival Spanish, cardiology.  -Continue home Coreg, Imdur -Continue home atorvastatin -Will restart home quinapril-HCTZ if needed -Hold home ASA while on heparin  Microcytic anemia: Chronic, stable. Hgb 12.3, baseline around 11.  Would expect decrease in the setting of vascular procedures as above. -Monitor CBC -Follow-up outpatient  Hypokalemia: Acute. K 3.1. -kdur 40 meq -BMP in am  FEN/GI: N.p.o. for procedures Prophylaxis: Heparin gtt  Disposition: Admit to progressive, attending Dr. Gwendlyn Deutscher  History of Present Illness:  Paul [name]. is a 49 y.o. male with a history of CAD, HTN, poorly controlled diabetes, and bipolar disorder presenting with critical limb ischemia.  He reports bilateral lower extremity pain for the past several months, however had significant worsening associated with bilateral weakness and numbness/tingling in his legs (left>right) approximately since 7/8.  Over this time, he feels like the right has gotten  a little bit better but the left persists.  He had been putting off evaluation for this, however his ex-wife convinced him to be  seen yesterday. He denied any back pain, bowel/bladder incontinence, saddle anesthesia, chest pain, or shortness of breath.  No inciting trauma or falls.  ED course: On arrival, he was hemodynamically stable.  Bilateral lower extremities were noted to be cool to palpation without ability to find posterior tibialis or dorsalis pedis pulses via Doppler.  After discussion with vascular surgery, Dr. Trula Slade, a CT angiogram with runoff was performed.  Objective leg weakness within foot flexion/extension on the left were noted during exam.  This showed bilateral femoral occlusions with no flow from the popliteal arteries down bilaterally. Dr. Trula Slade subsequently took him to the OR emergently for revascularization/thrombectomy.  We have been consulted for admission given his comorbidities.  Patient evaluated after he returned from the OR, reported his right leg feels fine however he continues to have numbness and weakness within his left leg.  He additionally reports Covid illness back in March, he has not had any recent fever, shortness of breath, cough, change in taste/smell, N/V.  No known Covid contacts.  Review Of Systems: Per HPI with the following additions:   Review of Systems  Constitutional: Negative for fatigue and fever.  Respiratory: Negative for cough and shortness of breath.   Cardiovascular: Negative for chest pain.  Genitourinary: Negative for dysuria.  Skin: Positive for pallor. Negative for rash.  Neurological: Positive for weakness and numbness. Negative for dizziness, syncope, light-headedness and headaches.     Patient Active Problem List   Diagnosis Date Noted  . Sensation of cold in leg 02/17/2020  . Type 2 diabetes mellitus with complication, with long-term current use of insulin ([place]) 02/08/2020  . Pierced lip infection 01/16/2020  . Shortness of  breath 12/31/2019  . Hospital discharge follow-up 11/08/2019  . Pneumonia due to COVID-19 virus 11/04/2019  . Neck mass 07/14/2019  . Hypotension 02/20/2019  . PTSD (post-traumatic stress disorder) 12/01/2018  . Cardiomyopathy ([place]) 01/14/2018  . Diabetic neuropathy ([place]) 01/10/2018  . Nausea alone 02/28/2013  . Cold sensation of skin 07/29/2012  . Right hand pain 05/03/2012  . Hypertension   . CAD (coronary artery disease)   . Hyperlipidemia LDL goal <100   . Stented coronary artery 04/30/2011  . Atopic dermatitis 08/19/2010  . Anxiety state 09/27/2009  . Diabetes mellitus type 2, uncontrolled ([place]) 09/19/2009    Past Medical History: Past Medical History:  Diagnosis Date  . Anxiety   . Bipolar disorder ([place])   . CAD (coronary artery disease)    Anterior MI 2007 with DES stenting of an occluded LAD, distal 70% stenosis, OM 60% stenosis, circumflex 90% stenosis treated with a drug-eluting stent, PA 90% stenosis treated medically.  Cath January 2013 70-80% diagonal stenosis in a small vessel, OM 50% stenosis, PDA long 75% stenosis. EF 45%. He was managed medically.  . Cardiomyopathy, ischemic    EF 45%  . Diabetes mellitus   . History of chest pain 11/25/2010  . Hyperlipidemia   . Hypertension   . Myocardial infarction ([place])   . PTSD (post-traumatic stress disorder)   . Tuberculosis    1989, took pills for it     Past Surgical History: Past Surgical History:  Procedure Laterality Date  . Catherization  08/25/11   Dr. Percival Spanish  . CORONARY ANGIOPLASTY WITH STENT PLACEMENT  207, 2011   4 stents placed  . DIRECT LARYNGOSCOPY Left 08/14/2019   Procedure: DIRECT LARYNGOSCOPY;  Surgeon: Rozetta Nunnery, MD;  Location: [facility];  Service: ENT;  Laterality: Left;  . LEFT HEART CATHETERIZATION  WITH CORONARY ANGIOGRAM N/A 08/25/2011   Procedure: LEFT HEART CATHETERIZATION WITH CORONARY ANGIOGRAM;  Surgeon: Minus Breeding, MD;  Location[facility] C[facility] LAB;  Service: Cardiovascular;   Laterality: N/A;  . MASS BIOPSY Left 08/14/2019   Procedure: BIOPSY EXCISION OF LEFT NECK MASS;  Surgeon: Rozetta Nunnery, MD;  Location: [facility] OR;  Service: ENT;  Laterality: Left;    Social History: Social History   Tobacco Use  . Smoking status: Never Smoker  . Smokeless tobacco: Never Used  Substance Use Topics  . Alcohol use: No  . Drug use: No   Please also refer to relevant sections of EMR.  Family History: Family History  Problem Relation Age of Onset  . Hypertension Mother   . Hypertension Father   . Diabetes Maternal Aunt   . Diabetes Maternal Uncle   . Cancer Maternal Grandmother     Allergies and Medications: Allergies  Allergen Reactions  . Shrimp [Shellfish Allergy] Swelling    tongue   No current facility-administered medications on file prior to encounter.   Current Outpatient Medications on File Prior to Encounter  Medication Sig Dispense Refill  . ARIPiprazole (ABILIFY) 5 MG tablet Take 1 tablet by mouth once daily (Patient taking differently: Take 5 mg by mouth daily. ) 30 tablet 0  . aspirin 81 MG EC tablet Take 1 tablet (81 mg total) by mouth daily. 90 tablet 0  . atorvastatin (LIPITOR) 20 MG tablet Take 1 tablet (20 mg total) by mouth daily. 90 tablet 3  . carvedilol (COREG) 12.5 MG tablet Take 1 tablet (12.5 mg total) by mouth 2 (two) times daily with a meal. 60 tablet 0  . citalopram (CELEXA) 20 MG tablet Take 1 tablet (20 mg total) by mouth 2 (two) times daily. 180 tablet 3  . empagliflozin (JARDIANCE) 25 MG TABS tablet Take 25 mg by mouth daily. 90 tablet 3  . insulin glargine (LANTUS) 100 UNIT/ML injection Inject 0.2 mLs (20 Units total) into the skin at bedtime. (Patient taking differently: Inject 15 Units into the skin daily. ) 10 mL 11  . isosorbide mononitrate (IMDUR) 30 MG 24 hr tablet Take 1 tablet (30 mg total) by mouth daily. 90 tablet 6  . metFORMIN (GLUCOPHAGE) 1000 MG tablet Take 1,000 mg by mouth 2 (two) times daily.    . Multiple  Vitamin (MULTIVITAMIN WITH MINERALS) TABS tablet Take 1 tablet by mouth daily.    . nitroGLYCERIN (NITROSTAT) 0.4 MG SL tablet Place 1 tablet (0.4 mg total) under the tongue every 5 (five) minutes as needed for chest pain. 30 tablet 1  . quinapril-hydrochlorothiazide (ACCURETIC) 20-25 MG tablet Take 1 tablet by mouth once daily 90 tablet 0  . traZODone (DESYREL) 50 MG tablet TAKE 1 TABLET BY MOUTH AT BEDTIME AS NEEDED FOR SLEEP (Patient taking differently: Take 50 mg by mouth at bedtime as needed for sleep. ) 30 tablet 0  . triamcinolone cream (KENALOG) 0.1 % Apply 1 application topically 2 (two) times daily. 453.6 g 0  . Blood Glucose Monitoring Suppl (ONE TOUCH ULTRA 2) W/DEVICE KIT Check sugar once daily. ICD10 E11.65 1 each 0  . Continuous Glucose Monitor KIT 1 kit by Does not apply route daily. 1 kit 0  . glucose blood (ONE TOUCH ULTRA TEST) test strip Check sugar once daily in the morning. ICD10 E11.65 30 each 5  . ONE TOUCH LANCETS MISC Use to check sugar once daily. ICD10 E11.65 30 each 5    Objective: BP (!) 159/101  Pulse 81   Temp 98.8 F (37.1 C) (Oral)   Resp 18   Ht _0  (1.803 m)   Wt 75 kg   SpO2 91%   BMI 23.06 kg/m  Exam: General: Alert, NAD, laying comfortably HEENT: NCAT, MMM Cardiac: RRR Lungs: Clear bilaterally, no increased WOB on room air Abdomen: soft, non-tender Ext: Able to move all extremities spontaneously.  Unable to palpate dorsalis pedis pulses bilaterally or posterior tibialis on the left, however can palpate faint PT pulse on the right.  Right extremity is slightly cool to touch, however left is cold.  No sensation to light touch with palpation of left foot, otherwise remainder of BLE sensation intact to light touch. 0/5 strength with left foot dorsiflexion/plantarflexion. Neuro: Alert and oriented.  Smile symmetrical, follows commands appropriately. Derm: No rash.  Wound VAC in place on left lower extremity. Psych: Normal mood and affect.  Labs  and Imaging: CBC BMET  Recent Labs  Lab 02/16/20 2044  WBC 6.9  HGB 12.3*  HCT 41.7  PLT 341   Recent Labs  Lab 02/16/20 2044  NA 136  K 3.1*  CL 99  CO2 27  BUN 12  CREATININE 1.19  GLUCOSE 126*  CALCIUM 8.8*      CT Angio Aortobifemoral W and/or Wo Contrast  Result Date: 02/17/2020 CLINICAL DATA:  LEFT lower leg numbness onset yesterday.  No injury. EXAM: CT ANGIOGRAPHY CHEST, ABDOMEN AND PELVIS CT ANGIOGRAPHY AORTOBIFEM WITH RUNOFF TECHNIQUE: Multidetector CT imaging through the chest, abdomen and pelvis was performed using the standard protocol during bolus administration of intravenous contrast. Multiplanar reconstructed images and MIPs were obtained and reviewed to evaluate the vascular anatomy. Additional axial images obtained through the lower extremities to the feet using standard protocol during bolus administration of intravascular contrast. Multiplanar reconstructed images and MIPS provided. CONTRAST:  146m OMNIPAQUE IOHEXOL 350 MG/ML SOLN COMPARISON:  Chest CT dated 11/04/2019. FINDINGS: CTA CHEST FINDINGS Cardiovascular: No thoracic aortic aneurysm or evidence of aortic dissection. Borderline cardiomegaly. No pericardial effusion. No pulmonary embolism is seen within the main, lobar or segmental pulmonary arteries bilaterally. Coronary artery calcifications. Mediastinum/Nodes: There are scattered/numerous mildly prominent lymph nodes within the mediastinum, including a 1.1 cm short axis lymph node in the upper aortopulmonary window region space (series 5, image 60) and conglomerate small lymph nodes within the anterior mediastinum (series 5, image 73). Esophagus is unremarkable. Trachea central bronchi are unremarkable. Lungs/Pleura: Improved aeration of the lower lobes compatible with resolved pneumonias. There is chronic appearing bibasilar fibrosis. 3 mm perifissural nodule the LEFT lower lobe (series 6, image 104), likely small lymph node. No new lung findings. No evidence  active pneumonia. No pleural effusion or pneumothorax. Musculoskeletal: No acute or suspicious osseous finding. Suspected bilateral gynecomastia. Review of the MIP images confirms the above findings. CTA ABDOMEN AND PELVIS FINDINGS VASCULAR Aorta: Normal caliber aorta without aneurysm, dissection, vasculitis or significant stenosis. Celiac: Patent without evidence of aneurysm, dissection, vasculitis or significant stenosis. SMA: Patent without evidence of aneurysm, dissection, vasculitis or significant stenosis. Renals: Both renal arteries are patent without evidence of aneurysm, dissection, vasculitis, fibromuscular dysplasia or significant stenosis. IMA: Patent without evidence of aneurysm, dissection, vasculitis or significant stenosis. Inflow: Atherosclerosis at the aortic bifurcation. Normal contrast flow is seen within the bilateral common iliac external iliac arteries. Nearly occlusive thrombosis at the level of the LEFT common femoral artery. Normal contrast flow seen to the LEFT SFA. Veins: No obvious venous abnormality within the limitations of this arterial phase study.  Review of the MIP images confirms the above findings. NON-VASCULAR Hepatobiliary: No focal liver abnormality is seen. No gallstones, gallbladder wall thickening, or biliary dilatation. Pancreas: Unremarkable. No pancreatic ductal dilatation or surrounding inflammatory changes. Spleen: Normal in size without focal abnormality. Adrenals/Urinary Tract: Adrenal glands are unremarkable. Hypodense masslike lesion within the lower pole of the LEFT kidney, medial cortex, measuring approximately 2.3 x 1.6 cm, mass versus confluent edema. RIGHT kidney appears normal without mass, stone hydronephrosis. No LEFT-sided hydronephrosis. No ureteral or bladder calculi are identified. Bladder is unremarkable. Stomach/Bowel: No dilated large or small bowel loops. No evidence of bowel wall inflammation. Appendix is normal. Is unremarkable, partially  decompressed. Lymphatic: No enlarged lymph nodes appreciated within the abdomen or pelvis. Reproductive: Prostate gland is mildly prominent causing slight mass effect on the bladder base. Other: No free fluid or abscess collection is seen. No free intraperitoneal air. Musculoskeletal: No acute or suspicious osseous finding. CT ANGIOGRAM RUNOFF FINDINGS Right Lower Extremity: Focal nearly occlusive thrombosis at the junction of the RIGHT SFA and deep profundus artery, but flow is present throughout the RIGHT SFA. Fairly extensive atherosclerosis of the RIGHT SFA. Normal contrast flow is seen at the level of the upper RIGHT popliteal artery. There is no contrast flow seen within the lower RIGHT popliteal artery or RIGHT calf arteries. Heavy atherosclerotic changes throughout the RIGHT calf arteries. Left Lower Extremity: As above, nearly occlusive thrombosis at the level of the LEFT common femoral artery extending to the junction of the LEFT SFA and deep profundus artery. Normal contrast flow is present throughout the LEFT SFA. Contrast flow is seen to the level of the upper LEFT popliteal artery. No contrast flow is seen within the lower LEFT popliteal artery or within the LEFT calf arteries. Heavy atherosclerotic changes are seen throughout the LEFT calf arteries. Soft tissues of the lower extremities are unremarkable. Osseous structures of the lower extremities are unremarkable. Review of the MIP images confirms the above findings. IMPRESSION: 1. Focal nearly occlusive thrombosis at the junction of the RIGHT SFA and deep profundus artery, but normal contrast flow is present throughout the RIGHT SFA. 2. Nearly occlusive thrombosis at the level of the LEFT common femoral artery extending to the junction of the LEFT SFA and deep profundus artery. Normal contrast flow is present throughout the LEFT SFA. 3. No contrast flow is seen within the calf arteries bilaterally (no flow is seen distal to either popliteal artery).  Cannot exclude complete occlusions at the level of both popliteal arteries, but as this is symmetric/bilateral, this may be due to contrast bolus timing (delayed/slow blood flow). 4. Heavy calcified atherosclerotic changes throughout the bilateral lower extremities. 5. **An incidental finding of potential clinical significance has been found. Masslike lesion within the lower pole of the LEFT kidney, medial cortex, measuring 2.3 x 1.6 cm. This is highly suspicious for neoplastic mass. Alternatively, but Paul likely, this could represent edema related to pyelonephritis or infarct. Recommend renal MRI for further characterization.** 6. Scattered/numerous small lymph nodes within the mediastinum, pathologic by number. These could be reactive or neoplastic in etiology. 7. Resolved bibasilar pneumonia compared to previous chest CT of 11/04/2019. 8. Chronic pulmonary fibrosis at the bilateral lung bases, moderate in degree. Critical Value/emergent results, including LEFT renal findings, were called by telephone at the time of interpretation on 02/17/2020 at 4:50 am to provider [facility] , who verbally acknowledged these results. Electronically Signed   By: Franki Cabot M.D.   On: 02/17/2020 04:54   DG  Ang/Ext/Uni/Or Right  Result Date: 02/17/2020 CLINICAL DATA:  Angiography. EXAM: RIGHT ANG/EXT/UNI/ OR CONTRAST:  See operative report. FLUOROSCOPY TIME:  Fluoroscopy Time:  2 minutes and 56 seconds Number of Acquired Spot Images: 3 images were submitted. COMPARISON:  CT from the same day. FINDINGS: On the right, there appears to be an acute complete occlusion of the above knee popliteal artery with reconstitution below the knee via collaterals. Atherosclerotic disease is noted in the visualized portions of the right SFA. IMPRESSION: Angiography as above. Please see separate operative report for complete details. Electronically Signed   By: Constance Holster M.D.   On: 02/17/2020 15:47   DG Ang/Ext/Uni/Or  Left  Result Date: 02/17/2020 CLINICAL DATA:  Angiography EXAM: LEFT ANG/EXT/UNI/ OR CONTRAST:  See separate operative report FLUOROSCOPY TIME:  Fluoroscopy Time:  2 minutes and 56 seconds Number of Acquired Spot Images: 5 images were submitted COMPARISON:  CT from the same day FINDINGS: A catheter is noted overlying the left SFA. A nonocclusive thrombus is noted within the proximal left profundus femoris artery. Atherosclerotic changes are noted of the left SFA. There is complete occlusion of the above knee popliteal artery with reconstitution distally via collaterals. There appears to be a single vessel runoff to the left ankle. IMPRESSION: Status post angiography as detailed above. Please see separate operative report for complete details. Electronically Signed   By: Constance Holster M.D.   On: 02/17/2020 15:49   DG C-Arm 1-60 Min  Result Date: 02/17/2020 CLINICAL DATA:  Angiography EXAM: DG C-ARM 1-60 MIN CONTRAST:  See operative report. FLUOROSCOPY TIME:  Fluoroscopy Time:  2 minutes and 56 seconds Number of Acquired Spot Images: 5 COMPARISON:  CT angiogram same day FINDINGS: A catheter projects over the left SFA and common femoral artery. There is a nonocclusive thrombus within the left proximal profundus femoris artery. There are atherosclerotic changes throughout the left SFA. There is complete occlusion of the above knee popliteal artery with reconstitution below the knee. There appears to be a single vessel runoff to the level of the ankle. IMPRESSION: Angiogram as detailed above. See separate operative report for complete details. Electronically Signed   By: Constance Holster M.D.   On: 02/17/2020 16:36   CT ANGIO CHEST AORTA W/CM & OR WO/CM  Result Date: 02/17/2020 CLINICAL DATA:  LEFT lower leg numbness onset yesterday.  No injury. EXAM: CT ANGIOGRAPHY CHEST, ABDOMEN AND PELVIS CT ANGIOGRAPHY AORTOBIFEM WITH RUNOFF TECHNIQUE: Multidetector CT imaging through the chest, abdomen and pelvis  was performed using the standard protocol during bolus administration of intravenous contrast. Multiplanar reconstructed images and MIPs were obtained and reviewed to evaluate the vascular anatomy. Additional axial images obtained through the lower extremities to the feet using standard protocol during bolus administration of intravascular contrast. Multiplanar reconstructed images and MIPS provided. CONTRAST:  160m OMNIPAQUE IOHEXOL 350 MG/ML SOLN COMPARISON:  Chest CT dated 11/04/2019. FINDINGS: CTA CHEST FINDINGS Cardiovascular: No thoracic aortic aneurysm or evidence of aortic dissection. Borderline cardiomegaly. No pericardial effusion. No pulmonary embolism is seen within the main, lobar or segmental pulmonary arteries bilaterally. Coronary artery calcifications. Mediastinum/Nodes: There are scattered/numerous mildly prominent lymph nodes within the mediastinum, including a 1.1 cm short axis lymph node in the upper aortopulmonary window region space (series 5, image 60) and conglomerate small lymph nodes within the anterior mediastinum (series 5, image 73). Esophagus is unremarkable. Trachea central bronchi are unremarkable. Lungs/Pleura: Improved aeration of the lower lobes compatible with resolved pneumonias. There is chronic appearing bibasilar fibrosis. 3 mm  perifissural nodule the LEFT lower lobe (series 6, image 104), likely small lymph node. No new lung findings. No evidence active pneumonia. No pleural effusion or pneumothorax. Musculoskeletal: No acute or suspicious osseous finding. Suspected bilateral gynecomastia. Review of the MIP images confirms the above findings. CTA ABDOMEN AND PELVIS FINDINGS VASCULAR Aorta: Normal caliber aorta without aneurysm, dissection, vasculitis or significant stenosis. Celiac: Patent without evidence of aneurysm, dissection, vasculitis or significant stenosis. SMA: Patent without evidence of aneurysm, dissection, vasculitis or significant stenosis. Renals: Both renal  arteries are patent without evidence of aneurysm, dissection, vasculitis, fibromuscular dysplasia or significant stenosis. IMA: Patent without evidence of aneurysm, dissection, vasculitis or significant stenosis. Inflow: Atherosclerosis at the aortic bifurcation. Normal contrast flow is seen within the bilateral common iliac external iliac arteries. Nearly occlusive thrombosis at the level of the LEFT common femoral artery. Normal contrast flow seen to the LEFT SFA. Veins: No obvious venous abnormality within the limitations of this arterial phase study. Review of the MIP images confirms the above findings. NON-VASCULAR Hepatobiliary: No focal liver abnormality is seen. No gallstones, gallbladder wall thickening, or biliary dilatation. Pancreas: Unremarkable. No pancreatic ductal dilatation or surrounding inflammatory changes. Spleen: Normal in size without focal abnormality. Adrenals/Urinary Tract: Adrenal glands are unremarkable. Hypodense masslike lesion within the lower pole of the LEFT kidney, medial cortex, measuring approximately 2.3 x 1.6 cm, mass versus confluent edema. RIGHT kidney appears normal without mass, stone hydronephrosis. No LEFT-sided hydronephrosis. No ureteral or bladder calculi are identified. Bladder is unremarkable. Stomach/Bowel: No dilated large or small bowel loops. No evidence of bowel wall inflammation. Appendix is normal. Is unremarkable, partially decompressed. Lymphatic: No enlarged lymph nodes appreciated within the abdomen or pelvis. Reproductive: Prostate gland is mildly prominent causing slight mass effect on the bladder base. Other: No free fluid or abscess collection is seen. No free intraperitoneal air. Musculoskeletal: No acute or suspicious osseous finding. CT ANGIOGRAM RUNOFF FINDINGS Right Lower Extremity: Focal nearly occlusive thrombosis at the junction of the RIGHT SFA and deep profundus artery, but flow is present throughout the RIGHT SFA. Fairly extensive  atherosclerosis of the RIGHT SFA. Normal contrast flow is seen at the level of the upper RIGHT popliteal artery. There is no contrast flow seen within the lower RIGHT popliteal artery or RIGHT calf arteries. Heavy atherosclerotic changes throughout the RIGHT calf arteries. Left Lower Extremity: As above, nearly occlusive thrombosis at the level of the LEFT common femoral artery extending to the junction of the LEFT SFA and deep profundus artery. Normal contrast flow is present throughout the LEFT SFA. Contrast flow is seen to the level of the upper LEFT popliteal artery. No contrast flow is seen within the lower LEFT popliteal artery or within the LEFT calf arteries. Heavy atherosclerotic changes are seen throughout the LEFT calf arteries. Soft tissues of the lower extremities are unremarkable. Osseous structures of the lower extremities are unremarkable. Review of the MIP images confirms the above findings. IMPRESSION: 1. Focal nearly occlusive thrombosis at the junction of the RIGHT SFA and deep profundus artery, but normal contrast flow is present throughout the RIGHT SFA. 2. Nearly occlusive thrombosis at the level of the LEFT common femoral artery extending to the junction of the LEFT SFA and deep profundus artery. Normal contrast flow is present throughout the LEFT SFA. 3. No contrast flow is seen within the calf arteries bilaterally (no flow is seen distal to either popliteal artery). Cannot exclude complete occlusions at the level of both popliteal arteries, but as this is symmetric/bilateral,  this may be due to contrast bolus timing (delayed/slow blood flow). 4. Heavy calcified atherosclerotic changes throughout the bilateral lower extremities. 5. **An incidental finding of potential clinical significance has been found. Masslike lesion within the lower pole of the LEFT kidney, medial cortex, measuring 2.3 x 1.6 cm. This is highly suspicious for neoplastic mass. Alternatively, but Paul likely, this could  represent edema related to pyelonephritis or infarct. Recommend renal MRI for further characterization.** 6. Scattered/numerous small lymph nodes within the mediastinum, pathologic by number. These could be reactive or neoplastic in etiology. 7. Resolved bibasilar pneumonia compared to previous chest CT of 11/04/2019. 8. Chronic pulmonary fibrosis at the bilateral lung bases, moderate in degree. Critical Value/emergent results, including LEFT renal findings, were called by telephone at the time of interpretation on 02/17/2020 at 4:50 am to provider [facility] , who verbally acknowledged these results. Electronically Signed   By: Franki Cabot M.D.   On: 02/17/2020 04:54    Patriciaann Clan, DO 02/17/2020, 8:40 AM PGY-3, [place] [facility] pager: [phone], text pages welcome

## 2020-02-17 NOTE — Anesthesia Preprocedure Evaluation (Signed)
Anesthesia Evaluation  Patient identified by MRN, date of birth, ID band Patient awake    Reviewed: Allergy & Precautions, NPO status , Patient's Chart, lab work & pertinent test results  History of Anesthesia Complications Negative for: history of anesthetic complications  Airway Mallampati: II  TM Distance: >3 FB Neck ROM: Full    Dental  (+) Dental Advisory Given   Pulmonary   Hx TB 1989 - treated    breath sounds clear to auscultation       Cardiovascular hypertension, Pt. on medications and Pt. on home beta blockers + CAD, + Past MI, + Cardiac Stents and + Peripheral Vascular Disease   Rhythm:Regular   '19 TTE - Septal and apical akinesis. LV cavity size was mildly dilated. Mild LVH. EF 40% to 45%. Mild TR and PR.     Neuro/Psych PSYCHIATRIC DISORDERS Anxiety Bipolar Disorder negative neurological ROS     GI/Hepatic negative GI ROS, Neg liver ROS,   Endo/Other  diabetes, Type 2, Oral Hypoglycemic Agents, Insulin Dependent Hypokalemia, K 3.1   Renal/GU negative Renal ROS     Musculoskeletal negative musculoskeletal ROS (+)   Abdominal   Peds  Hematology   Anesthesia Other Findings Covid Positive (had + test in March 2021 as well)   Reproductive/Obstetrics                             Anesthesia Physical Anesthesia Plan  ASA: III  Anesthesia Plan: General   Post-op Pain Management:    Induction: Intravenous and Rapid sequence  PONV Risk Score and Plan: 2 and Ondansetron and Dexamethasone  Airway Management Planned: Oral ETT  Additional Equipment: None  Intra-op Plan:   Post-operative Plan: Extubation in OR  Informed Consent: I have reviewed the patients History and Physical, chart, labs and discussed the procedure including the risks, benefits and alternatives for the proposed anesthesia with the patient or authorized representative who has indicated his/her  understanding and acceptance.     Dental advisory given  Plan Discussed with: CRNA and Surgeon  Anesthesia Plan Comments:         Anesthesia Quick Evaluation

## 2020-02-17 NOTE — Op Note (Addendum)
Patient name: Paul [name]. MRN: [id number] DOB: 1970-08-23 Sex: male  02/17/2020 Pre-operative Diagnosis: Bilateral ischemic lower extremities Post-operative diagnosis:  Same Surgeon:  Durene Cal Assistants: Aggie Moats Procedure:   #1: Bilateral femoral artery exposure with bilateral iliofemoral thrombectomy   #2: Bilateral lower extremity arteriogram   #3: Open exposure of the left below-knee popliteal artery and subsequent retrograde thrombectomy   #4: 4 compartment left leg fasciotomy   #5: Left leg wound VAC to the fasciotomy site Anesthesia:   General Blood Loss: 400 cc Specimens: Bilateral femoral thrombus to pathology  Findings: 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  I was unable to advance the Fogarty catheter beyond the knee.  Arteriogram showed that he was perfusing both legs via collaterals.  I tried to perform thrombectomy through exposure of the below-knee popliteal artery however this revealed a chronic occlusion.  He had posterior tibial Doppler signals at the end of the case.  Fasciotomy was performed in the left leg.  All muscle appeared viable  Indications: The patient has a history of Covid positive in March.  He states that he has had approximately 2-day history of pain in his legs.  The right leg initially bother him however this improved.  Now his left leg is the main issue.  He does not have any neurologic function of the left foot which is cold.  He comes in today for emergent revascularization  Procedure:  The patient was identified in the holding area and taken to [facility] OR ROOM 11  The patient was then placed supine on the table. general anesthesia was administered.  The patient was prepped and draped in the usual sterile fashion.  A time out was called and antibiotics were administered.  A PA was necessary to perform the technical aspects of the procedure as well as to assist with suction retraction and suture closure.  Longitudinal incisions  were made in both groins.  The common femoral profundofemoral and superficial femoral arteries were individually isolated bilaterally.  The patient was fully heparinized.  I initially began on the right leg.  A transverse arteriotomy was made in the distal common femoral artery.  Fogarty catheter was passed proximally and no thrombus was evacuated.  There was excellent inflow.  I then passed the Fogarty catheter down the profundofemoral artery as well as the superficial femoral artery and evacuated small amounts of thrombus.  I was unable to advance the Fogarty catheter beyond the knee.  The arteriotomy was then closed with running 5-0 Prolene in transverse fashion.  I then opened the left femoral artery transversely.  There was fresh thrombus visible.  A 4 Fogarty was passed into the aorta and a fair amount of thrombus was evacuated until a negative pass was performed.  There was excellent inflow.  Thrombectomy of the profundofemoral artery was also performed evacuating clot.  The catheter was then advanced down the superficial femoral artery but also was unable to pass distal to the knee.  The arteriotomy was then closed transversely with 5-0 Prolene.  I then performed bilateral lower extremity arteriograms by inserting a micropuncture sheath into the bilateral common femoral arteries.  On the left this showed popliteal occlusion with collateral flow around the knee reconstituting the posterior tibial artery.  There were similar findings on the right.  I felt that this represented a chronic popliteal occlusion.  The sheaths were removed and the arteriotomy closed with 5-0 Prolene.  I then proceeded to perform lower extremity fasciotomies.  A medial  and lateral incision were made and the compartments were opened from the knee to the ankle.  All muscle appeared viable.  Through the medial incision I went ahead and exposed the popliteal artery as well as the tibioperoneal trunk and posterior tibial artery.  These  were heavily calcified circumferentially.  I wanted to confirm that there was not acute thrombus in the popliteal artery and so I opened the below-knee popliteal artery transversely.  I passed a Fogarty catheter proximally.  I was able to get through the occlusion however there was no acute thrombus.  This appeared to be chronically occluded.  I could not advance the Fogarty down the posterior tibial artery.  I then closed the arteriotomy transversely.  The patient had a Doppler signal in his posterior tibial artery.  I elected to stop at this time.  A wound VAC was placed on the medial and lateral fasciotomy incisions.  The groins were closed with multiple layers of Vicryl followed by subcuticular closure.  Patient was then taken to recovery in stable condition.   Disposition: To PACU stable.   Juleen China, M.D., [facility] [place] Office: [phone] Pager:  [phone]

## 2020-02-17 NOTE — Anesthesia Procedure Notes (Signed)
Procedure Name: Intubation Date/Time: 02/17/2020 7:46 AM Performed by: De Nurse, CRNA Pre-anesthesia Checklist: Patient identified, Emergency Drugs available, Suction available and Patient being monitored Patient Re-evaluated:Patient Re-evaluated prior to induction Oxygen Delivery Method: Circle System Utilized Preoxygenation: Pre-oxygenation with 100% oxygen Induction Type: IV induction and Rapid sequence Ventilation: Mask ventilation without difficulty Laryngoscope Size: Glidescope and 4 Grade View: Grade I Tube type: Oral Tube size: 8.0 mm Number of attempts: 1 Airway Equipment and Method: Stylet and Oral airway Placement Confirmation: ETT inserted through vocal cords under direct vision,  positive ETCO2 and breath sounds checked- equal and bilateral Secured at: 22 cm Tube secured with: Tape Dental Injury: Teeth and Oropharynx as per pre-operative assessment  Comments: Elective RSI and glidescope use d/t +Covid status

## 2020-02-17 NOTE — Progress Notes (Signed)
ANTICOAGULATION CONSULT NOTE - Initial Consult  Pharmacy Consult for heparin Indication: limb ischemia  Allergies  Allergen Reactions  . Shrimp [Shellfish Allergy] Swelling    tongue    Patient Measurements: Height: 5\' 11"  (180.3 cm) Weight: 75 kg (165 lb 5.5 oz) IBW/kg (Calculated) : 75.3  Vital Signs: Temp: 98.8 F (37.1 C) (07/10 0014) Temp Source: Oral (07/10 0014) BP: 154/101 (07/10 0330) Pulse Rate: 118 (07/10 0330)  Labs: Recent Labs    02/16/20 2044  HGB 12.3*  HCT 41.7  PLT 341  CREATININE 1.19    Estimated Creatinine Clearance: 79.7 mL/min (by C-G formula based on SCr of 1.19 mg/dL).   Medical History: Past Medical History:  Diagnosis Date  . Anxiety   . Bipolar disorder (HCC)   . CAD (coronary artery disease)    Anterior MI 2007 with DES stenting of an occluded LAD, distal 70% stenosis, OM 60% stenosis, circumflex 90% stenosis treated with a drug-eluting stent, PA 90% stenosis treated medically.  Cath January 2013 70-80% diagonal stenosis in a small vessel, OM 50% stenosis, PDA long 75% stenosis. EF 45%. He was managed medically.  . Cardiomyopathy, ischemic    EF 45%  . Diabetes mellitus   . History of chest pain 11/25/2010  . Hyperlipidemia   . Hypertension   . Myocardial infarction (HCC)   . PTSD (post-traumatic stress disorder)   . Tuberculosis    1989, took pills for it     Assessment: 49yo male c/o numbness and tingling of LLE, MD felt no pulses on exam >> concern for limb ischemia, to begin heparin.  Goal of Therapy:  Heparin level 0.3-0.7 units/ml Monitor platelets by anticoagulation protocol: Yes   Plan:  Will give heparin 4000 units IV bolus x1 followed by gtt at 1100 units/hr and monitor heparin levels and CBC.  49yo, PharmD, BCPS  02/17/2020,5:06 AM

## 2020-02-17 NOTE — ED Provider Notes (Signed)
[facility] Provider Note   CSN: [id number] Arrival date & time: 02/16/20  1956     History Chief Complaint  Patient presents with  . Left Lower Leg Numbness    Paul Go. is a 49 y.o. male.  Patient with history of CAD, diabetes, hypertension, bipolar disorder presenting with weakness, numbness and tingling to his left leg onset yesterday.  Patient states that yesterday his "right leg did not work".  He states there is a sensation of numbness and tingling weakness in the right leg with pain that lasted most of the day.  Has had this intermittently in the past.  Now the right leg feels improved but his left leg has numbness and tingling and pain has been ongoing since this morning.  He has some weakness in the flexion extension of his ankle and difficulty walking.  He denies any back pain.  He denies any bowel or bladder incontinence.  Denies any abdominal pain or chest pain.  He denies any fever or cough.  Denies any history of back problems in the past.  Denies any blood thinner use.  The history is provided by the patient.       Past Medical History:  Diagnosis Date  . Anxiety   . Bipolar disorder ([place])   . CAD (coronary artery disease)    Anterior MI 2007 with DES stenting of an occluded LAD, distal 70% stenosis, OM 60% stenosis, circumflex 90% stenosis treated with a drug-eluting stent, PA 90% stenosis treated medically.  Cath January 2013 70-80% diagonal stenosis in a small vessel, OM 50% stenosis, PDA long 75% stenosis. EF 45%. He was managed medically.  . Cardiomyopathy, ischemic    EF 45%  . Diabetes mellitus   . History of chest pain 11/25/2010  . Hyperlipidemia   . Hypertension   . Myocardial infarction ([place])   . PTSD (post-traumatic stress disorder)   . Tuberculosis    1989, took pills for it     Patient Active Problem List   Diagnosis Date Noted  . Type 2 diabetes mellitus with complication, with long-term  current use of insulin ([place]) 02/08/2020  . Pierced lip infection 01/16/2020  . Shortness of breath 12/31/2019  . Hospital discharge follow-up 11/08/2019  . Pneumonia due to COVID-19 virus 11/04/2019  . Neck mass 07/14/2019  . Hypotension 02/20/2019  . PTSD (post-traumatic stress disorder) 12/01/2018  . Cardiomyopathy ([place]) 01/14/2018  . Diabetic neuropathy ([place]) 01/10/2018  . Nausea alone 02/28/2013  . Cold sensation of skin 07/29/2012  . Right hand pain 05/03/2012  . Hypertension   . CAD (coronary artery disease)   . Hyperlipidemia LDL goal <100   . Stented coronary artery 04/30/2011  . Atopic dermatitis 08/19/2010  . Anxiety state 09/27/2009  . Diabetes mellitus type 2, uncontrolled ([place]) 09/19/2009    Past Surgical History:  Procedure Laterality Date  . Catherization  08/25/11   Dr. Percival Spanish  . CORONARY ANGIOPLASTY WITH STENT PLACEMENT  207, 2011   4 stents placed  . DIRECT LARYNGOSCOPY Left 08/14/2019   Procedure: DIRECT LARYNGOSCOPY;  Surgeon: Rozetta Nunnery, MD;  Location: [facility];  Service: ENT;  Laterality: Left;  . LEFT HEART CATHETERIZATION WITH CORONARY ANGIOGRAM N/A 08/25/2011   Procedure: LEFT HEART CATHETERIZATION WITH CORONARY ANGIOGRAM;  Surgeon: Minus Breeding, MD;  Location: [facility] CATH LAB;  Service: Cardiovascular;  Laterality: N/A;  . MASS BIOPSY Left 08/14/2019   Procedure: BIOPSY EXCISION OF LEFT NECK MASS;  Surgeon: Rozetta Nunnery,  MD;  Location: [facility] OR;  Service: ENT;  Laterality: Left;       Family History  Problem Relation Age of Onset  . Hypertension Mother   . Hypertension Father   . Diabetes Maternal Aunt   . Diabetes Maternal Uncle   . Cancer Maternal Grandmother     Social History   Tobacco Use  . Smoking status: Never Smoker  . Smokeless tobacco: Never Used  Substance Use Topics  . Alcohol use: No  . Drug use: No    Home Medications Prior to Admission medications   Medication Sig Start Date End Date Taking? Authorizing  Provider  ARIPiprazole (ABILIFY) 5 MG tablet Take 1 tablet by mouth once daily 01/09/20   Nuala Alpha, DO  aspirin 81 MG EC tablet Take 1 tablet (81 mg total) by mouth daily. 11/06/19   Lattie Haw, MD  atorvastatin (LIPITOR) 20 MG tablet Take 1 tablet (20 mg total) by mouth daily. 12/29/19   Nuala Alpha, DO  Blood Glucose Monitoring Suppl (ONE TOUCH ULTRA 2) W/DEVICE KIT Check sugar once daily. ICD10 E11.65 01/24/15   Leone Brand, MD  carvedilol (COREG) 12.5 MG tablet Take 1 tablet (12.5 mg total) by mouth 2 (two) times daily with a meal. 11/06/19   Lattie Haw, MD  citalopram (CELEXA) 20 MG tablet Take 1 tablet (20 mg total) by mouth 2 (two) times daily. 10/26/19   Nuala Alpha, DO  Continuous Glucose Monitor KIT 1 kit by Does not apply route daily. 02/20/19   Caroline More, DO  empagliflozin (JARDIANCE) 25 MG TABS tablet Take 25 mg by mouth daily. 12/01/19   Lockamy, Timothy, DO  glucose blood (ONE TOUCH ULTRA TEST) test strip Check sugar once daily in the morning. ICD10 E11.65 01/10/18   Mikell, Jeani Sow, MD  insulin glargine (LANTUS) 100 UNIT/ML injection Inject 0.2 mLs (20 Units total) into the skin at bedtime. Patient taking differently: Inject 25 Units into the skin daily.  12/01/19   Nuala Alpha, DO  isosorbide mononitrate (IMDUR) 30 MG 24 hr tablet Take 1 tablet (30 mg total) by mouth daily. 03/21/19   Nuala Alpha, DO  metFORMIN (GLUCOPHAGE) 1000 MG tablet Take 1,000 mg by mouth 2 (two) times daily. 11/21/19   [provider]  Multiple Vitamin (MULTIVITAMIN WITH MINERALS) TABS tablet Take 1 tablet by mouth daily.    [provider]  nitroGLYCERIN (NITROSTAT) 0.4 MG SL tablet Place 1 tablet (0.4 mg total) under the tongue every 5 (five) minutes as needed for chest pain. 10/07/19   Nuala Alpha, DO  ONE TOUCH LANCETS MISC Use to check sugar once daily. ICD10 E11.65 01/09/15   Willeen Niece, MD  quinapril-hydrochlorothiazide (ACCURETIC) 20-25 MG  tablet Take 1 tablet by mouth once daily 01/09/20   Nuala Alpha, DO  traZODone (DESYREL) 50 MG tablet TAKE 1 TABLET BY MOUTH AT BEDTIME AS NEEDED FOR SLEEP 01/09/20   Nuala Alpha, DO  triamcinolone cream (KENALOG) 0.1 % Apply 1 application topically 2 (two) times daily. 08/01/19   Sherene Sires, DO    Allergies    Shrimp [shellfish allergy]  Review of Systems   Review of Systems  Constitutional: Positive for fatigue. Negative for activity change, appetite change and fever.  HENT: Negative for congestion.   Respiratory: Negative for cough, chest tightness and shortness of breath.   Cardiovascular: Negative for chest pain and leg swelling.  Genitourinary: Negative for dysuria and hematuria.  Musculoskeletal: Positive for arthralgias and myalgias. Negative for back pain and  neck pain.  Skin: Negative for rash.  Neurological: Positive for weakness and numbness.    all other systems are negative except as noted in the HPI and PMH.    Physical Exam Updated Vital Signs BP (!) 136/107 (BP Location: Right Arm)   Pulse 76   Temp 98.8 F (37.1 C) (Oral)   Resp 16   Ht 5' 11"  (1.803 m)   Wt 75 kg   SpO2 100%   BMI 23.06 kg/m   Physical Exam Vitals and nursing note reviewed.  Constitutional:      General: He is not in acute distress.    Appearance: He is well-developed.  HENT:     Head: Normocephalic and atraumatic.     Mouth/Throat:     Pharynx: No oropharyngeal exudate.  Eyes:     Conjunctiva/sclera: Conjunctivae normal.     Pupils: Pupils are equal, round, and reactive to light.  Neck:     Comments: No meningismus. Cardiovascular:     Rate and Rhythm: Normal rate and regular rhythm.     Heart sounds: Normal heart sounds. No murmur heard.   Pulmonary:     Effort: Pulmonary effort is normal. No respiratory distress.     Breath sounds: Normal breath sounds.  Abdominal:     Palpations: Abdomen is soft.     Tenderness: There is no abdominal tenderness. There is no  guarding or rebound.  Musculoskeletal:        General: No tenderness. Normal range of motion.     Cervical back: Normal range of motion and neck supple.     Comments: Bilateral lower extremities are cool to palpation.   Unable to Doppler PT or DP pulses bilaterally.  Good femoral pulses.  Weakness with flexion and extension of the ankle on the left.  Able to bend knees bilaterally and hips bilaterally.  Skin:    General: Skin is warm.  Neurological:     Mental Status: He is alert and oriented to person, place, and time.     Cranial Nerves: No cranial nerve deficit.     Motor: No abnormal muscle tone.     Coordination: Coordination normal.     Comments:  5/5 strength throughout. CN 2-12 intact.Equal grip strength.   Psychiatric:        Behavior: Behavior normal.     ED Results / Procedures / Treatments   Labs (all labs ordered are listed, but only abnormal results are displayed) Labs Reviewed  SARS CORONAVIRUS 2 BY RT PCR ([place]) - Abnormal; Notable for the following components:      Result Value   SARS Coronavirus 2 POSITIVE (*)    All other components within normal limits  CBC WITH DIFFERENTIAL/PLATELET - Abnormal; Notable for the following components:   Hemoglobin 12.3 (*)    MCV 76.2 (*)    MCH 22.5 (*)    MCHC 29.5 (*)    RDW 20.0 (*)    All other components within normal limits  COMPREHENSIVE METABOLIC PANEL - Abnormal; Notable for the following components:   Potassium 3.1 (*)    Glucose, Bld 126 (*)    Calcium 8.8 (*)    Total Protein 8.3 (*)    Albumin 3.1 (*)    AST 49 (*)    All other components within normal limits  HEPARIN LEVEL (UNFRACTIONATED)    EKG None  Radiology CT Angio Aortobifemoral W and/or Wo Contrast  Result Date: 02/17/2020 CLINICAL DATA:  LEFT lower leg numbness onset yesterday.  No injury. EXAM: CT ANGIOGRAPHY CHEST, ABDOMEN AND PELVIS CT ANGIOGRAPHY AORTOBIFEM WITH RUNOFF TECHNIQUE:  Multidetector CT imaging through the chest, abdomen and pelvis was performed using the standard protocol during bolus administration of intravenous contrast. Multiplanar reconstructed images and MIPs were obtained and reviewed to evaluate the vascular anatomy. Additional axial images obtained through the lower extremities to the feet using standard protocol during bolus administration of intravascular contrast. Multiplanar reconstructed images and MIPS provided. CONTRAST:  155m OMNIPAQUE IOHEXOL 350 MG/ML SOLN COMPARISON:  Chest CT dated 11/04/2019. FINDINGS: CTA CHEST FINDINGS Cardiovascular: No thoracic aortic aneurysm or evidence of aortic dissection. Borderline cardiomegaly. No pericardial effusion. No pulmonary embolism is seen within the main, lobar or segmental pulmonary arteries bilaterally. Coronary artery calcifications. Mediastinum/Nodes: There are scattered/numerous mildly prominent lymph nodes within the mediastinum, including a 1.1 cm short axis lymph node in the upper aortopulmonary window region space (series 5, image 60) and conglomerate small lymph nodes within the anterior mediastinum (series 5, image 73). Esophagus is unremarkable. Trachea central bronchi are unremarkable. Lungs/Pleura: Improved aeration of the lower lobes compatible with resolved pneumonias. There is chronic appearing bibasilar fibrosis. 3 mm perifissural nodule the LEFT lower lobe (series 6, image 104), likely small lymph node. No new lung findings. No evidence active pneumonia. No pleural effusion or pneumothorax. Musculoskeletal: No acute or suspicious osseous finding. Suspected bilateral gynecomastia. Review of the MIP images confirms the above findings. CTA ABDOMEN AND PELVIS FINDINGS VASCULAR Aorta: Normal caliber aorta without aneurysm, dissection, vasculitis or significant stenosis. Celiac: Patent without evidence of aneurysm, dissection, vasculitis or significant stenosis. SMA: Patent without evidence of aneurysm,  dissection, vasculitis or significant stenosis. Renals: Both renal arteries are patent without evidence of aneurysm, dissection, vasculitis, fibromuscular dysplasia or significant stenosis. IMA: Patent without evidence of aneurysm, dissection, vasculitis or significant stenosis. Inflow: Atherosclerosis at the aortic bifurcation. Normal contrast flow is seen within the bilateral common iliac external iliac arteries. Nearly occlusive thrombosis at the level of the LEFT common femoral artery. Normal contrast flow seen to the LEFT SFA. Veins: No obvious venous abnormality within the limitations of this arterial phase study. Review of the MIP images confirms the above findings. NON-VASCULAR Hepatobiliary: No focal liver abnormality is seen. No gallstones, gallbladder wall thickening, or biliary dilatation. Pancreas: Unremarkable. No pancreatic ductal dilatation or surrounding inflammatory changes. Spleen: Normal in size without focal abnormality. Adrenals/Urinary Tract: Adrenal glands are unremarkable. Hypodense masslike lesion within the lower pole of the LEFT kidney, medial cortex, measuring approximately 2.3 x 1.6 cm, mass versus confluent edema. RIGHT kidney appears normal without mass, stone hydronephrosis. No LEFT-sided hydronephrosis. No ureteral or bladder calculi are identified. Bladder is unremarkable. Stomach/Bowel: No dilated large or small bowel loops. No evidence of bowel wall inflammation. Appendix is normal. Is unremarkable, partially decompressed. Lymphatic: No enlarged lymph nodes appreciated within the abdomen or pelvis. Reproductive: Prostate gland is mildly prominent causing slight mass effect on the bladder base. Other: No free fluid or abscess collection is seen. No free intraperitoneal air. Musculoskeletal: No acute or suspicious osseous finding. CT ANGIOGRAM RUNOFF FINDINGS Right Lower Extremity: Focal nearly occlusive thrombosis at the junction of the RIGHT SFA and deep profundus artery, but  flow is present throughout the RIGHT SFA. Fairly extensive atherosclerosis of the RIGHT SFA. Normal contrast flow is seen at the level of the upper RIGHT popliteal artery. There is no contrast flow seen within the lower RIGHT popliteal artery or RIGHT calf arteries. Heavy atherosclerotic changes throughout  the RIGHT calf arteries. Left Lower Extremity: As above, nearly occlusive thrombosis at the level of the LEFT common femoral artery extending to the junction of the LEFT SFA and deep profundus artery. Normal contrast flow is present throughout the LEFT SFA. Contrast flow is seen to the level of the upper LEFT popliteal artery. No contrast flow is seen within the lower LEFT popliteal artery or within the LEFT calf arteries. Heavy atherosclerotic changes are seen throughout the LEFT calf arteries. Soft tissues of the lower extremities are unremarkable. Osseous structures of the lower extremities are unremarkable. Review of the MIP images confirms the above findings. IMPRESSION: 1. Focal nearly occlusive thrombosis at the junction of the RIGHT SFA and deep profundus artery, but normal contrast flow is present throughout the RIGHT SFA. 2. Nearly occlusive thrombosis at the level of the LEFT common femoral artery extending to the junction of the LEFT SFA and deep profundus artery. Normal contrast flow is present throughout the LEFT SFA. 3. No contrast flow is seen within the calf arteries bilaterally (no flow is seen distal to either popliteal artery). Cannot exclude complete occlusions at the level of both popliteal arteries, but as this is symmetric/bilateral, this may be due to contrast bolus timing (delayed/slow blood flow). 4. Heavy calcified atherosclerotic changes throughout the bilateral lower extremities. 5. **An incidental finding of potential clinical significance has been found. Masslike lesion within the lower pole of the LEFT kidney, medial cortex, measuring 2.3 x 1.6 cm. This is highly suspicious for  neoplastic mass. Alternatively, but less likely, this could represent edema related to pyelonephritis or infarct. Recommend renal MRI for further characterization.** 6. Scattered/numerous small lymph nodes within the mediastinum, pathologic by number. These could be reactive or neoplastic in etiology. 7. Resolved bibasilar pneumonia compared to previous chest CT of 11/04/2019. 8. Chronic pulmonary fibrosis at the bilateral lung bases, moderate in degree. Critical Value/emergent results, including LEFT renal findings, were called by telephone at the time of interpretation on 02/17/2020 at 4:50 am to provider [facility] , who verbally acknowledged these results. Electronically Signed   By: Franki Cabot M.D.   On: 02/17/2020 04:54   CT ANGIO CHEST AORTA W/CM & OR WO/CM  Result Date: 02/17/2020 CLINICAL DATA:  LEFT lower leg numbness onset yesterday.  No injury. EXAM: CT ANGIOGRAPHY CHEST, ABDOMEN AND PELVIS CT ANGIOGRAPHY AORTOBIFEM WITH RUNOFF TECHNIQUE: Multidetector CT imaging through the chest, abdomen and pelvis was performed using the standard protocol during bolus administration of intravenous contrast. Multiplanar reconstructed images and MIPs were obtained and reviewed to evaluate the vascular anatomy. Additional axial images obtained through the lower extremities to the feet using standard protocol during bolus administration of intravascular contrast. Multiplanar reconstructed images and MIPS provided. CONTRAST:  172m OMNIPAQUE IOHEXOL 350 MG/ML SOLN COMPARISON:  Chest CT dated 11/04/2019. FINDINGS: CTA CHEST FINDINGS Cardiovascular: No thoracic aortic aneurysm or evidence of aortic dissection. Borderline cardiomegaly. No pericardial effusion. No pulmonary embolism is seen within the main, lobar or segmental pulmonary arteries bilaterally. Coronary artery calcifications. Mediastinum/Nodes: There are scattered/numerous mildly prominent lymph nodes within the mediastinum, including a 1.1 cm short  axis lymph node in the upper aortopulmonary window region space (series 5, image 60) and conglomerate small lymph nodes within the anterior mediastinum (series 5, image 73). Esophagus is unremarkable. Trachea central bronchi are unremarkable. Lungs/Pleura: Improved aeration of the lower lobes compatible with resolved pneumonias. There is chronic appearing bibasilar fibrosis. 3 mm perifissural nodule the LEFT lower lobe (series 6, image 104), likely small  lymph node. No new lung findings. No evidence active pneumonia. No pleural effusion or pneumothorax. Musculoskeletal: No acute or suspicious osseous finding. Suspected bilateral gynecomastia. Review of the MIP images confirms the above findings. CTA ABDOMEN AND PELVIS FINDINGS VASCULAR Aorta: Normal caliber aorta without aneurysm, dissection, vasculitis or significant stenosis. Celiac: Patent without evidence of aneurysm, dissection, vasculitis or significant stenosis. SMA: Patent without evidence of aneurysm, dissection, vasculitis or significant stenosis. Renals: Both renal arteries are patent without evidence of aneurysm, dissection, vasculitis, fibromuscular dysplasia or significant stenosis. IMA: Patent without evidence of aneurysm, dissection, vasculitis or significant stenosis. Inflow: Atherosclerosis at the aortic bifurcation. Normal contrast flow is seen within the bilateral common iliac external iliac arteries. Nearly occlusive thrombosis at the level of the LEFT common femoral artery. Normal contrast flow seen to the LEFT SFA. Veins: No obvious venous abnormality within the limitations of this arterial phase study. Review of the MIP images confirms the above findings. NON-VASCULAR Hepatobiliary: No focal liver abnormality is seen. No gallstones, gallbladder wall thickening, or biliary dilatation. Pancreas: Unremarkable. No pancreatic ductal dilatation or surrounding inflammatory changes. Spleen: Normal in size without focal abnormality. Adrenals/Urinary  Tract: Adrenal glands are unremarkable. Hypodense masslike lesion within the lower pole of the LEFT kidney, medial cortex, measuring approximately 2.3 x 1.6 cm, mass versus confluent edema. RIGHT kidney appears normal without mass, stone hydronephrosis. No LEFT-sided hydronephrosis. No ureteral or bladder calculi are identified. Bladder is unremarkable. Stomach/Bowel: No dilated large or small bowel loops. No evidence of bowel wall inflammation. Appendix is normal. Is unremarkable, partially decompressed. Lymphatic: No enlarged lymph nodes appreciated within the abdomen or pelvis. Reproductive: Prostate gland is mildly prominent causing slight mass effect on the bladder base. Other: No free fluid or abscess collection is seen. No free intraperitoneal air. Musculoskeletal: No acute or suspicious osseous finding. CT ANGIOGRAM RUNOFF FINDINGS Right Lower Extremity: Focal nearly occlusive thrombosis at the junction of the RIGHT SFA and deep profundus artery, but flow is present throughout the RIGHT SFA. Fairly extensive atherosclerosis of the RIGHT SFA. Normal contrast flow is seen at the level of the upper RIGHT popliteal artery. There is no contrast flow seen within the lower RIGHT popliteal artery or RIGHT calf arteries. Heavy atherosclerotic changes throughout the RIGHT calf arteries. Left Lower Extremity: As above, nearly occlusive thrombosis at the level of the LEFT common femoral artery extending to the junction of the LEFT SFA and deep profundus artery. Normal contrast flow is present throughout the LEFT SFA. Contrast flow is seen to the level of the upper LEFT popliteal artery. No contrast flow is seen within the lower LEFT popliteal artery or within the LEFT calf arteries. Heavy atherosclerotic changes are seen throughout the LEFT calf arteries. Soft tissues of the lower extremities are unremarkable. Osseous structures of the lower extremities are unremarkable. Review of the MIP images confirms the above  findings. IMPRESSION: 1. Focal nearly occlusive thrombosis at the junction of the RIGHT SFA and deep profundus artery, but normal contrast flow is present throughout the RIGHT SFA. 2. Nearly occlusive thrombosis at the level of the LEFT common femoral artery extending to the junction of the LEFT SFA and deep profundus artery. Normal contrast flow is present throughout the LEFT SFA. 3. No contrast flow is seen within the calf arteries bilaterally (no flow is seen distal to either popliteal artery). Cannot exclude complete occlusions at the level of both popliteal arteries, but as this is symmetric/bilateral, this may be due to contrast bolus timing (delayed/slow blood flow). 4.  Heavy calcified atherosclerotic changes throughout the bilateral lower extremities. 5. **An incidental finding of potential clinical significance has been found. Masslike lesion within the lower pole of the LEFT kidney, medial cortex, measuring 2.3 x 1.6 cm. This is highly suspicious for neoplastic mass. Alternatively, but less likely, this could represent edema related to pyelonephritis or infarct. Recommend renal MRI for further characterization.** 6. Scattered/numerous small lymph nodes within the mediastinum, pathologic by number. These could be reactive or neoplastic in etiology. 7. Resolved bibasilar pneumonia compared to previous chest CT of 11/04/2019. 8. Chronic pulmonary fibrosis at the bilateral lung bases, moderate in degree. Critical Value/emergent results, including LEFT renal findings, were called by telephone at the time of interpretation on 02/17/2020 at 4:50 am to provider [facility] , who verbally acknowledged these results. Electronically Signed   By: Franki Cabot M.D.   On: 02/17/2020 04:54    Procedures .Critical Care Performed by: Ezequiel Essex, MD Authorized by: Ezequiel Essex, MD   Critical care provider statement:    Critical care time (minutes):  60   Critical care was necessary to treat or  prevent imminent or life-threatening deterioration of the following conditions: acute limb ischemia.   Critical care was time spent personally by me on the following activities:  Discussions with consultants, evaluation of patient's response to treatment, examination of patient, ordering and performing treatments and interventions, ordering and review of laboratory studies, ordering and review of radiographic studies, pulse oximetry, re-evaluation of patient's condition, obtaining history from patient or surrogate and review of old charts   (including critical care time)  Medications Ordered in ED Medications - No data to display  ED Course  I have reviewed the triage vital signs and the nursing notes.  Pertinent labs & imaging results that were available during my care of the patient were reviewed by me and considered in my medical decision making (see chart for details).    MDM Rules/Calculators/A&P                         Lower extremity numbness, weakness and tingling.  No pulses felt on exam.  Concern for limb ischemia.  Discussed with Dr. Trula Slade soon as patient was in a room.  He has been in the waiting room for over 7 hours.  Dr. Trula Slade agrees with CT angiogram with runoff to his lower extremities.  We will start empiric heparin.  CTA is concerning for bilateral femoral occlusions as well as no flow from the popliteal arteries down bilaterally. Also with concerning kidney mass the patient is informed of that needs further follow-up with MRI.  Dr. Trula Slade is seeing patient with plans to take to the OR emergently for revascularization likely fasciotomy.  Empiric heparin continued. He asks for hospitalist consultation as well.  D/w Dr. Hal Hope.  Patient with weakness in the foot flexion and extension on the left. Patient understands this is a limb threatening situation. Final Clinical Impression(s) / ED Diagnoses Final diagnoses:  Critical lower limb ischemia    Rx / DC  Orders ED Discharge Orders    None       Esli Jernigan, Annie Main, MD 02/17/20 [phone]

## 2020-02-17 NOTE — Transfer of Care (Signed)
Immediate Anesthesia Transfer of Care Note  Patient: Paul [name].  Procedure(s) Performed: LOWER EXTREMITY ANGIOGRAM WITH Stenting Left Popliteal Artery, Angioplasty posterior Tibial Artery (Left ) FASCIOTOMY CLOSURE (Left Leg Lower)  Patient Location: PACU  Anesthesia Type:General  Level of Consciousness: drowsy, responds to stimulation  Airway & Oxygen Therapy: Patient Spontanous Breathing and Patient connected to face mask oxygen  Post-op Assessment: Report given to RN and Post -op Vital signs reviewed and stable  Post vital signs: Reviewed and stable  Last Vitals:  Vitals Value Taken Time  BP 167/107 02/17/20 2125  Temp 36.1 C 02/17/20 2125  Pulse    Resp 17 02/17/20 2136  SpO2 100 % 02/17/20 2125  Vitals shown include unvalidated device data.  Last Pain:  Vitals:   02/17/20 2125  TempSrc:   PainSc: 0-No pain         Complications: No complications documented.

## 2020-02-17 NOTE — Consult Note (Signed)
 [facility] [place]  Patient name: Paul [name]. MRN: [id number] DOB: 03/24/1971 Sex: male   REQUESTING PROVIDER:    ER   REASON FOR CONSULT:    Bilateral cold leg  HISTORY OF PRESENT ILLNESS:   Paul [name]. is a 49 y.o. male, who presented to the emergency department today with complaints of pain in his legs.  The left is more severe.  He is having difficulty moving his toes and feeling his foot.  This is been going on for a day or 2.  He said he had similar findings in the right leg however this improved.  Patient has a history of coronary artery disease, status post stenting in 2007.  He has recently had a syncopal episode he had stopped taking his Plavix.  Patient had Covid back in March.  He is bipolar.  He is a diabetic.  He is a non-smoker.  He takes a statin for hypercholesterolemia.  PAST MEDICAL HISTORY    Past Medical History:  Diagnosis Date  . Anxiety   . Bipolar disorder (HCC)   . CAD (coronary artery disease)    Anterior MI 2007 with DES stenting of an occluded LAD, distal 70% stenosis, OM 60% stenosis, circumflex 90% stenosis treated with a drug-eluting stent, PA 90% stenosis treated medically.  Cath January 2013 70-80% diagonal stenosis in a small vessel, OM 50% stenosis, PDA long 75% stenosis. EF 45%. He was managed medically.  . Cardiomyopathy, ischemic    EF 45%  . Diabetes mellitus   . History of chest pain 11/25/2010  . Hyperlipidemia   . Hypertension   . Myocardial infarction (HCC)   . PTSD (post-traumatic stress disorder)   . Tuberculosis    1989, took pills for it      FAMILY HISTORY   Family History  Problem Relation Age of Onset  . Hypertension Mother   . Hypertension Father   . Diabetes Maternal Aunt   . Diabetes Maternal Uncle   . Cancer Maternal Grandmother     SOCIAL HISTORY:   Social History   Socioeconomic History  . Marital status: Single    Spouse  name: Not on file  . Number of children: Not on file  . Years of education: Not on file  . Highest education level: Not on file  Occupational History  . Not on file  Tobacco Use  . Smoking status: Never Smoker  . Smokeless tobacco: Never Used  Substance and Sexual Activity  . Alcohol use: No  . Drug use: No  . Sexual activity: Not on file  Other Topics Concern  . Not on file  Social History Narrative  . Not on file   Social Determinants of Health   Financial Resource Strain:   . Difficulty of Paying Living Expenses:   Food Insecurity:   . Worried About Running Out of Food in the Last Year:   . Ran Out of Food in the Last Year:   Transportation Needs:   . Lack of Transportation (Medical):   . Lack of Transportation (Non-Medical):   Physical Activity:   . Days of Exercise per Week:   . Minutes of Exercise per Session:   Stress:   . Feeling of Stress :   Social Connections:   . Frequency of Communication with Friends and Family:   . Frequency of Social Gatherings with Friends and Family:   . Attends Religious Services:   . Active Member of Clubs or Organizations:   .   Attends Club or Organization Meetings:   . Marital Status:   Intimate Partner Violence:   . Fear of Current or Ex-Partner:   . Emotionally Abused:   . Physically Abused:   . Sexually Abused:     ALLERGIES:    Allergies  Allergen Reactions  . Shrimp [Shellfish Allergy] Swelling    tongue    CURRENT MEDICATIONS:    Current Facility-Administered Medications  Medication Dose Route Frequency Provider Last Rate Last Admin  . heparin ADULT infusion 100 units/mL (25000 units/250mL sodium chloride 0.45%)  1,100 Units/hr Intravenous Continuous Bryk, Veronda P, RPH 11 mL/hr at 02/17/20 0528 1,100 Units/hr at 02/17/20 0528   Current Outpatient Medications  Medication Sig Dispense Refill  . ARIPiprazole (ABILIFY) 5 MG tablet Take 1 tablet by mouth once daily (Patient taking differently: Take 5 mg by mouth  daily. ) 30 tablet 0  . aspirin 81 MG EC tablet Take 1 tablet (81 mg total) by mouth daily. 90 tablet 0  . atorvastatin (LIPITOR) 20 MG tablet Take 1 tablet (20 mg total) by mouth daily. 90 tablet 3  . carvedilol (COREG) 12.5 MG tablet Take 1 tablet (12.5 mg total) by mouth 2 (two) times daily with a meal. 60 tablet 0  . citalopram (CELEXA) 20 MG tablet Take 1 tablet (20 mg total) by mouth 2 (two) times daily. 180 tablet 3  . empagliflozin (JARDIANCE) 25 MG TABS tablet Take 25 mg by mouth daily. 90 tablet 3  . insulin glargine (LANTUS) 100 UNIT/ML injection Inject 0.2 mLs (20 Units total) into the skin at bedtime. (Patient taking differently: Inject 15 Units into the skin daily. ) 10 mL 11  . isosorbide mononitrate (IMDUR) 30 MG 24 hr tablet Take 1 tablet (30 mg total) by mouth daily. 90 tablet 6  . metFORMIN (GLUCOPHAGE) 1000 MG tablet Take 1,000 mg by mouth 2 (two) times daily.    . Multiple Vitamin (MULTIVITAMIN WITH MINERALS) TABS tablet Take 1 tablet by mouth daily.    . nitroGLYCERIN (NITROSTAT) 0.4 MG SL tablet Place 1 tablet (0.4 mg total) under the tongue every 5 (five) minutes as needed for chest pain. 30 tablet 1  . quinapril-hydrochlorothiazide (ACCURETIC) 20-25 MG tablet Take 1 tablet by mouth once daily 90 tablet 0  . traZODone (DESYREL) 50 MG tablet TAKE 1 TABLET BY MOUTH AT BEDTIME AS NEEDED FOR SLEEP (Patient taking differently: Take 50 mg by mouth at bedtime as needed for sleep. ) 30 tablet 0  . triamcinolone cream (KENALOG) 0.1 % Apply 1 application topically 2 (two) times daily. 453.6 g 0  . Blood Glucose Monitoring Suppl (ONE TOUCH ULTRA 2) W/DEVICE KIT Check sugar once daily. ICD10 E11.65 1 each 0  . Continuous Glucose Monitor KIT 1 kit by Does not apply route daily. 1 kit 0  . glucose blood (ONE TOUCH ULTRA TEST) test strip Check sugar once daily in the morning. ICD10 E11.65 30 each 5  . ONE TOUCH LANCETS MISC Use to check sugar once daily. ICD10 E11.65 30 each 5    REVIEW  OF SYSTEMS:   [X] denotes positive finding, [ ] denotes negative finding Cardiac  Comments:  Chest pain or chest pressure:    Shortness of breath upon exertion:    Short of breath when lying flat:    Irregular heart rhythm:        Vascular    Pain in calf, thigh, or hip brought on by ambulation:    Pain in feet at night that wakes   you up from your sleep:  x   Blood clot in your veins:    Leg swelling:         Pulmonary    Oxygen at home:    Productive cough:     Wheezing:         Neurologic    Sudden weakness in arms or legs:     Sudden numbness in arms or legs:     Sudden onset of difficulty speaking or slurred speech:    Temporary loss of vision in one eye:     Problems with dizziness:         Gastrointestinal    Blood in stool:      Vomited blood:         Genitourinary    Burning when urinating:     Blood in urine:        Psychiatric    Major depression:         Hematologic    Bleeding problems:    Problems with blood clotting too easily:        Skin    Rashes or ulcers:        Constitutional    Fever or chills:     PHYSICAL EXAM:   Vitals:   02/16/20 2039 02/17/20 0014 02/17/20 0330 02/17/20 0530  BP:  (!) 136/107 (!) 154/101 (!) 159/101  Pulse:  76 (!) 118 81  Resp:  16 16 18  Temp:  98.8 F (37.1 C)    TempSrc:  Oral    SpO2:  100% 100% 91%  Weight: 75 kg     Height: 5' 11" (1.803 m)       GENERAL: The patient is a well-nourished male, in no acute distress. The vital signs are documented above. CARDIAC: There is a regular rate and rhythm.  VASCULAR: Monophasic right posterior tibial Doppler signal.  No Doppler signals on the left.  Palpable femoral pulses. PULMONARY: Nonlabored respirations ABDOMEN: Soft and non-tender with normal pitched bowel sounds.  MUSCULOSKELETAL: There are no major deformities or cyanosis. NEUROLOGIC: Loss of motor function and sensation in the left foot up to the mid calf SKIN: There are no ulcers or rashes noted.  PSYCHIATRIC: The patient has a normal affect.  STUDIES:   I have reviewed the following CT: 1. Focal nearly occlusive thrombosis at the junction of the RIGHT SFA and deep profundus artery, but normal contrast flow is present throughout the RIGHT SFA. 2. Nearly occlusive thrombosis at the level of the LEFT common femoral artery extending to the junction of the LEFT SFA and deep profundus artery. Normal contrast flow is present throughout the LEFT SFA. 3. No contrast flow is seen within the calf arteries bilaterally (no flow is seen distal to either popliteal artery). Cannot exclude complete occlusions at the level of both popliteal arteries, but as this is symmetric/bilateral, this may be due to contrast bolus timing (delayed/slow blood flow). 4. Heavy calcified atherosclerotic changes throughout the bilateral lower extremities. 5. **An incidental finding of potential clinical significance has been found. Masslike lesion within the lower pole of the LEFT kidney, medial cortex, measuring 2.3 x 1.6 cm. This is highly suspicious for neoplastic mass. Alternatively, but less likely, this could represent edema related to pyelonephritis or infarct. Recommend renal MRI for further characterization.** 6. Scattered/numerous small lymph nodes within the mediastinum, pathologic by number. These could be reactive or neoplastic in etiology. 7. Resolved bibasilar pneumonia compared to previous chest CT of 11/04/2019. 8. Chronic   pulmonary fibrosis at the bilateral lung bases, moderate in degree.  ASSESSMENT and PLAN   Bilateral femoral thrombus: I discussed with the patient that we need to go emergently to the operating room for thrombectomy.  Hopefully this will be able to be performed through a femoral incision.  He will likely need fasciotomies on the left.  We discussed that he may not get return of neurologic function in the left leg given the duration of his ischemia.  I also discussed that  this is a limb threatening situation.   Leia Alf, MD, FACS [facility] [phone] Pager [phone]

## 2020-02-17 NOTE — Progress Notes (Signed)
Received page from [facility] team, Dr. Katrinka Blazing, pertaining this patient for medical admission. He is a patient of our clinic.  He currently is in the OR due to critical limb ischemia undergoing emergent thrombectomy +/- fasciotomy.  We will evaluate the patient postoperatively for admission onto our service.  Allayne Stack, DO

## 2020-02-17 NOTE — Progress Notes (Signed)
The patient presented with an ischemic left leg which have been going on for 1 to 2 days.  Upon my evaluation he did not have motor or sensory function.  He had a CT scan that showed thrombus within bilateral femoral arteries.  Therefore he was taken emergently to the operating room.  He underwent bilateral thrombectomies through groin incision.  Thrombus was evacuated from both legs however Fogarty catheters could not be passed distal to the knee.  Arteriograms were performed bilaterally which showed popliteal occlusion.  This did appear to be chronic however I exposed and opened the below-knee popliteal artery.  I did not see any thrombus but rather a chronic occlusion.  I therefore felt that his leg was viable based off of collaterals.  He did have a posterior tibial Doppler signal.  Fasciotomies were performed on the left.  The patient currently does not have a posterior tibial Doppler signal and he still does not have neurologic function of his leg.  I discussed with him that this is a limb threatening problem has a high chance of ending up an amputation.  I propose going back to the operating room 1 last time to try to improve his blood flow.  I will initially try to stent across the occlusion however he may require femoral posterior tibial bypass graft.  However, his posterior tibial artery from what I had exposed and from what I saw on his CT scan is a very poor target.  Paul [name]

## 2020-02-18 ENCOUNTER — Inpatient Hospital Stay (HOSPITAL_COMMUNITY): Payer: Medicare Other

## 2020-02-18 ENCOUNTER — Encounter (HOSPITAL_COMMUNITY): Payer: Self-pay | Admitting: Surgery

## 2020-02-18 DIAGNOSIS — N2889 Other specified disorders of kidney and ureter: Secondary | ICD-10-CM

## 2020-02-18 LAB — POCT ACTIVATED CLOTTING TIME
Activated Clotting Time: 224 seconds
Activated Clotting Time: 257 seconds

## 2020-02-18 LAB — POCT I-STAT, CHEM 8
BUN: 13 mg/dL (ref 6–20)
Calcium, Ion: 1.12 mmol/L — ABNORMAL LOW (ref 1.15–1.40)
Chloride: 102 mmol/L (ref 98–111)
Creatinine, Ser: 0.7 mg/dL (ref 0.61–1.24)
Glucose, Bld: 107 mg/dL — ABNORMAL HIGH (ref 70–99)
HCT: 29 % — ABNORMAL LOW (ref 39.0–52.0)
Hemoglobin: 9.9 g/dL — ABNORMAL LOW (ref 13.0–17.0)
Potassium: 3.3 mmol/L — ABNORMAL LOW (ref 3.5–5.1)
Sodium: 142 mmol/L (ref 135–145)
TCO2: 27 mmol/L (ref 22–32)

## 2020-02-18 LAB — COMPREHENSIVE METABOLIC PANEL
ALT: 18 U/L (ref 0–44)
AST: 61 U/L — ABNORMAL HIGH (ref 15–41)
Albumin: 2.8 g/dL — ABNORMAL LOW (ref 3.5–5.0)
Alkaline Phosphatase: 37 U/L — ABNORMAL LOW (ref 38–126)
Anion gap: 12 (ref 5–15)
BUN: 14 mg/dL (ref 6–20)
CO2: 22 mmol/L (ref 22–32)
Calcium: 8.2 mg/dL — ABNORMAL LOW (ref 8.9–10.3)
Chloride: 102 mmol/L (ref 98–111)
Creatinine, Ser: 1 mg/dL (ref 0.61–1.24)
GFR calc Af Amer: >60 mL/min (ref >60)
GFR calc non Af Amer: >60 mL/min (ref >60)
Glucose, Bld: 150 mg/dL — ABNORMAL HIGH (ref 70–99)
Potassium: 3.9 mmol/L (ref 3.5–5.1)
Sodium: 136 mmol/L (ref 135–145)
Total Bilirubin: 0.9 mg/dL (ref 0.3–1.2)
Total Protein: 7.3 g/dL (ref 6.5–8.1)

## 2020-02-18 LAB — GLUCOSE, CAPILLARY
Glucose-Capillary: 127 mg/dL — ABNORMAL HIGH (ref 70–99)
Glucose-Capillary: 163 mg/dL — ABNORMAL HIGH (ref 70–99)
Glucose-Capillary: 197 mg/dL — ABNORMAL HIGH (ref 70–99)

## 2020-02-18 MED ORDER — INSULIN ASPART 100 UNIT/ML ~~LOC~~ SOLN: 0.0000 [IU] | Freq: Every day | SUBCUTANEOUS | Status: DC

## 2020-02-18 MED ORDER — GADOBUTROL 1 MMOL/ML IV SOLN
7.0000 mL | Freq: Once | INTRAVENOUS | Status: AC | PRN
  Administered 2020-02-18: 7 mL via INTRAVENOUS

## 2020-02-18 MED ORDER — ENOXAPARIN SODIUM 40 MG/0.4ML ~~LOC~~ SOLN
40.0000 mg | SUBCUTANEOUS | Status: DC
  Administered 2020-02-18 – 2020-02-21 (×4): 40 mg via SUBCUTANEOUS
  Filled 2020-02-18 (×4): qty 0.4

## 2020-02-18 MED ORDER — CLOPIDOGREL BISULFATE 75 MG PO TABS
75.0000 mg | ORAL_TABLET | Freq: Every day | ORAL | Status: DC
  Administered 2020-02-18 – 2020-02-22 (×5): 75 mg via ORAL
  Filled 2020-02-18 (×5): qty 1

## 2020-02-18 MED ORDER — INSULIN ASPART 100 UNIT/ML ~~LOC~~ SOLN
0.0000 [IU] | Freq: Three times a day (TID) | SUBCUTANEOUS | Status: DC
  Administered 2020-02-18: 2 [IU] via SUBCUTANEOUS
  Administered 2020-02-19 (×2): 1 [IU] via SUBCUTANEOUS
  Administered 2020-02-19 – 2020-02-20 (×2): 2 [IU] via SUBCUTANEOUS
  Administered 2020-02-20: 1 [IU] via SUBCUTANEOUS
  Administered 2020-02-20 – 2020-02-21 (×2): 2 [IU] via SUBCUTANEOUS
  Administered 2020-02-21: 1 [IU] via SUBCUTANEOUS
  Administered 2020-02-22: 2 [IU] via SUBCUTANEOUS
  Administered 2020-02-22: 1 [IU] via SUBCUTANEOUS
  Administered 2020-02-22: 2 [IU] via SUBCUTANEOUS

## 2020-02-18 NOTE — Plan of Care (Signed)
Poc progressing.  

## 2020-02-18 NOTE — Evaluation (Signed)
Occupational Therapy Evaluation Patient Details Name: Paul [name]. MRN: [id number] DOB: 06/26/1971 Today's Date: 02/18/2020    History of Present Illness Pt is a 49 y/o male with a PMH significant for TB, PTSD, MI, HTN, DM, ischemic cardiomyopathy with EF 45%, CAD, bipolar disorder, COVID-19 in 10/2019. Pt presented with worsening leg weakness and numbness, found to have critical limb ischemia and is now s/p bilateral thrombectomy and L fasciotomy on 02/27/2020.    Clinical Impression   Pt admitted with above. He demonstrates the below listed deficits and will benefit from continued OT to maximize safety and independence with BADLs.  Pt presents to OT with increased pain, decreased balance, and decreased activity tolerance.  He requires mod - max A for LB ADLs and min guard - min A for functional transfers.  He lives with a roommate and was fully independent PTA. He does depend on public transportation and therefore, walks a lot.  He may benefit from SCAT application.  Will follow acutely.       Follow Up Recommendations  No OT follow up;Supervision - Intermittent    Equipment Recommendations  3 in 1 bedside commode;Tub/shower bench    Recommendations for Other Services       Precautions / Restrictions Precautions Precautions: Fall Restrictions Weight Bearing Restrictions: No      Mobility Bed Mobility               General bed mobility comments: Pt was received sitting EOB with OT present upon PT arrival.   Transfers Overall transfer level: Needs assistance Equipment used: Rolling walker (2 wheeled) Transfers: Sit to/from [employer] Sit to Stand: Min guard Stand pivot transfers: Min guard       General transfer comment: Close guard as pt powered up to full standing position. VC's for hand placement on seated surface for safety however pt with BUE's on the walker anyway.     Balance Overall balance assessment: Needs  assistance Sitting-balance support: Feet supported;No upper extremity supported Sitting balance-Leahy Scale: Fair     Standing balance support: Bilateral upper extremity supported Standing balance-Leahy Scale: Poor Standing balance comment: Reliant on UE support - likely for pain control as well as balance.                           ADL either performed or assessed with clinical judgement   ADL Overall ADL's : Needs assistance/impaired Eating/Feeding: Independent   Grooming: Wash/dry hands;Wash/dry face;Oral care;Brushing hair;Min guard;Standing   Upper Body Bathing: Set up;Sitting   Lower Body Bathing: Moderate assistance;Sit to/from stand Lower Body Bathing Details (indicate cue type and reason): unable to access feet  Upper Body Dressing : Set up;Sitting   Lower Body Dressing: Maximal assistance;Sit to/from stand Lower Body Dressing Details (indicate cue type and reason): unable to access feet  Toilet Transfer: Minimal assistance;Ambulation;Comfort height toilet;RW   Toileting- Clothing Manipulation and Hygiene: Minimal assistance;Sit to/from stand       Functional mobility during ADLs: Min guard;Rolling walker General ADL Comments: limited by pain      Vision         Perception     Praxis      Pertinent Vitals/Pain Pain Assessment: 0-10 Pain Score: 7  Pain Location: At beginning of session. Pt reports 5/10 at end of session.  Pain Descriptors / Indicators: Operative site guarding Pain Intervention(s): Monitored during session;Limited activity within patient's tolerance     Hand Dominance Right  Extremity/Trunk Assessment Upper Extremity Assessment Upper Extremity Assessment: Overall WFL for tasks assessed   Lower Extremity Assessment Lower Extremity Assessment: Defer to PT evaluation LLE Deficits / Details: decreased DF in the L foot LLE Sensation: decreased light touch LLE Coordination: decreased gross motor   Cervical / Trunk  Assessment Cervical / Trunk Assessment: Normal   Communication Communication Communication: No difficulties   Cognition Arousal/Alertness: Awake/alert Behavior During Therapy: WFL for tasks assessed/performed Overall Cognitive Status: Within Functional Limits for tasks assessed                                     General Comments  HR to 121 with activity     Exercises     Shoulder Instructions      Home Living Family/patient expects to be discharged to:: Private residence Living Arrangements: Non-relatives/Friends Available Help at Discharge: Friend(s);Available PRN/intermittently Type of Home: Apartment Home Access: Level entry Entrance Stairs-Number of Steps: 0   Home Layout: One level     Bathroom Shower/Tub: Chief Strategy Officer: Standard     Home Equipment: Crutches   Additional Comments: lives with a roommate       Prior Functioning/Environment Level of Independence: Independent        Comments: Pt reports he relies on public transportation and walks a lot. He is on disability due to cardiac issues and DM         OT Problem List: Decreased activity tolerance;Impaired balance (sitting and/or standing);Decreased knowledge of use of DME or AE;Cardiopulmonary status limiting activity;Pain      OT Treatment/Interventions: Self-care/ADL training;DME and/or AE instruction;Therapeutic activities;Patient/family education;Balance training    OT Goals(Current goals can be found in the care plan section) Acute Rehab OT Goals Patient Stated Goal: None stated OT Goal Formulation: With patient Time For Goal Achievement: 03/03/20 Potential to Achieve Goals: Good ADL Goals Pt Will Perform Grooming: with modified independence;standing Pt Will Perform Lower Body Bathing: with modified independence;sit to/from stand;with adaptive equipment Pt Will Perform Lower Body Dressing: with modified independence;sit to/from stand;with adaptive  equipment Pt Will Transfer to Toilet: with modified independence;ambulating;regular height toilet;bedside commode;grab bars Pt Will Perform Toileting - Clothing Manipulation and hygiene: with modified independence;sit to/from stand Pt Will Perform Tub/Shower Transfer: Tub transfer;with supervision;ambulating;tub bench;rolling walker  OT Frequency: Min 2X/week   Barriers to D/C: Decreased caregiver support          Co-evaluation PT/OT/SLP Co-Evaluation/Treatment: Yes Reason for Co-Treatment: For patient/therapist safety;To address functional/ADL transfers   OT goals addressed during session: ADL's and self-care      AM-PAC OT "6 Clicks" Daily Activity     Outcome Measure Help from another person eating meals?: None Help from another person taking care of personal grooming?: A Little Help from another person toileting, which includes using toliet, bedpan, or urinal?: A Little Help from another person bathing (including washing, rinsing, drying)?: A Lot Help from another person to put on and taking off regular upper body clothing?: A Little Help from another person to put on and taking off regular lower body clothing?: A Lot 6 Click Score: 17   End of Session Equipment Utilized During Treatment: Gait belt;Rolling walker Nurse Communication: Mobility status  Activity Tolerance: Patient limited by pain Patient left: in chair;with call bell/phone within reach;with chair alarm set  OT Visit Diagnosis: Pain Pain - Right/Left: Left Pain - part of body: Leg  Time: 7408-1448 OT Time Calculation (min): 31 min Charges:  OT General Charges $OT Visit: 1 Visit OT Evaluation $OT Eval Moderate Complexity: 1 Mod  Eber Jones., OTR/L Acute Rehabilitation Services Pager [phone] Office [phone]   Jeani Hawking M 02/18/2020, 5:30 PM

## 2020-02-18 NOTE — Progress Notes (Signed)
Brief daily note. I will cosign the resident's note once it is completed.  The patient is doing well post-op. He denies any new concerns this morning. Otherwise, no acute findings on his physical exam. Left LL fasciotomy incision well dressed. Reduced sensation of his Left LL  A/P: Critical limb secondary to Bilateral femoral thrombus Day #1 post-op S/P revascularization/arterial stenting. Monitor for improvement in his LL functions, especially his left LL. Vasc surg recommended ASA and Plavix. May need anticoagulants pending assessment of his renal mass.  Left Renal mass of CTA: We will obtain renal MRI to r/o malignancy.

## 2020-02-18 NOTE — Progress Notes (Signed)
Transported pt to unit. Fasciotomy bled onto bedding on transport. DP and PT dopplered on unit with bedside RN. No evidence of bleeding from groin sites.

## 2020-02-18 NOTE — Progress Notes (Addendum)
Pt recived from PACU. AO x4. Vitals stable, breathing even and unlabored in RA. CHG bath completed. Left lower lag surgical site was bleeding bright red blood, it just started bleeding during transfer per PACU RNs. Dressing Reinforced, left leg elevated in a pillow. Bilateral DP and PT pulses has doppler signal. Bilateral groin site has no bleeding. Oriented pt to room and call bell system. Call bell within reach, will continue to monitor.

## 2020-02-18 NOTE — Progress Notes (Signed)
    Subjective  - POD #1  Says his left leg feels better today   Physical Exam:  Bilateral femoral incisions are healing nicely.  There is a small hematoma on the left Patient has triphasic left posterior tibial Doppler signal and monophasic right posterior tibial Doppler signal Fasciotomy incisions are soft with staple closure.  No evidence of compartment syndrome Patient continues to be without motor or sensory function in the left foot       Assessment/Plan:  POD #1  I discussed with the patient given his prolonged ischemia time prior to his arrival to the hospital and initial presentation with no motor function of the left foot, it is unclear how much function he will get back.  He does have a triphasic posterior tibial Doppler signal now.  There is no evidence of compartment syndrome in his fasciotomy skin incisions have been stapled closed.  Patient needs physical therapy occupational therapy to facilitate mobility  I am placing him on aspirin and Plavix because of the stent.  We will also need to consider whether or not he needs to be on anticoagulation. I am going to order an echo to rule out a cardioembolic source.  Wells Raenell Mensing 02/18/2020 8:35 AM --  Vitals:   02/18/20 0336 02/18/20 0650  BP: 124/84 122/88  Pulse: 93 93  Resp: 19 17  Temp: 99.2 F (37.3 C) 99.3 F (37.4 C)  SpO2: 98% 100%    Intake/Output Summary (Last 24 hours) at 02/18/2020 0835 Last data filed at 02/18/2020 0656 Gross per 24 hour  Intake 3868.37 ml  Output 3620 ml  Net 248.37 ml     Laboratory CBC    Component Value Date/Time   WBC 6.9 02/16/2020 2044   HGB 9.9 (L) 02/17/2020 2047   HGB 13.1 07/14/2019 1634   HCT 29.0 (L) 02/17/2020 2047   HCT 40.9 07/14/2019 1634   PLT 341 02/16/2020 2044   PLT 390 07/14/2019 1634    BMET    Component Value Date/Time   NA 136 02/18/2020 0600   NA 138 07/14/2019 1634   K 3.9 02/18/2020 0600   CL 102 02/18/2020 0600   CO2 22  02/18/2020 0600   GLUCOSE 150 (H) 02/18/2020 0600   BUN 14 02/18/2020 0600   BUN 17 07/14/2019 1634   CREATININE 1.00 02/18/2020 0600   CALCIUM 8.2 (L) 02/18/2020 0600   GFRNONAA >60 02/18/2020 0600   GFRAA >60 02/18/2020 0600    COAG Lab Results  Component Value Date   INR 1.1 (H) 08/24/2011   No results found for: PTT  Antibiotics Anti-infectives (From admission, onward)   Start     Dose/Rate Route Frequency Ordered Stop   02/17/20 1600  ceFAZolin (ANCEF) IVPB 2g/100 mL premix        2 g 200 mL/hr over 30 Minutes Intravenous Every 8 hours 02/17/20 1439 02/18/20 0759       V. Charlena Cross, M.D., [facility] [place] Office: [phone] Pager:  [phone]

## 2020-02-18 NOTE — Progress Notes (Signed)
Family Medicine Teaching Service Daily Progress Note Intern Pager: [phone]  Patient name: Paul [name]. Medical record number: [id number] Date of birth: 11-Feb-1971 Age: 49 y.o. Gender: male  Primary Care Provider: Unknown Jim, DO Consultants: Vascular surg Code Status: Full  Pt Overview and Major Events to Date:  Admitted 7/10 7/10 AM bilateral iliofemoral thrombectomy and LLE fasciotomy 7/10 PM successful recanalization of left popliteal artery and posterior tibial artery, closure of LLE fasciotomy  Assessment and Plan: Ryson Bacha. is a 49 y.o. male presenting with worsening leg weakness and numbness, found to have critical limb ischemia.  PMH is significant for T2DM, CAD, hypertension, COVID-19 illness in 10/2019, hyperlipidemia, and cardiomyopathy.  Critical limb ischemia, s/p thrombectomy: Acute.  Nearly occlusive thrombus of bilateral femoral arteries.  POD #1.  Today bilateral pedal and pretibial pulses dopplered. -Appreciate vascular surgery postop recs -Heparin GGT D/C7/11 at 0235 -Plavix 75 mg daily starting 7/11 at 1000 -Likely need for long-term anticoagulation -Percocet 5-325mg  q4 prn (moderate pain), morphine 2g q2 prn (severe pain)  L renal mass seen on CT: Acute. 2.3X 1.6 cm masslike lesion within lower left kidney pole,  neoplasm suspected.    Edema related to pyelonephritis versus infarct.   Creatinine baseline estimated 1.19.  Admission CR -Will further characterize with MRI when medically appropriate  Recent COVID-19 illness: Stable. Covid PCR on admit still positive, do not have a negative test in between known Covid illness requiring hospitalization in 10/2019.  Currently asymptomatic, suspect continuation from illness several months ago.  May contribute to hypercoagulable state leading to critical limb ischemia as above. -Should not need precautions  T2DM: Chronic, stable. CBG 126 on admit.  A1c 7.7 in 01/2020.  Takes Metformin,  Jardiance, and Lantus 15 U at home. -SSI -CBGs with meals/nightly -Monitor BMP -Hold home medications as above, can consider adding Lantus if CBGs uncontrolled  Hypertension: Chronic, stable.  Systolic 110-160s.  Takes Imdur, Coreg, and quinapril-HCTZ at home. -Monitor BP -Continue home Imdur 30 mg daily and Coreg 12.5 mg twice daily with meals -Will restart Enalapril-HCTZ if needed for further control  Bipolar II disorder: Chronic, stable. Mood appropriate.  Takes Celexa 20 mg twice daily and Abilify 5 mg daily. -Continue home regimen  CAD  HFmrEF w/ EF 40-45%: Chronic, stable. Asymptomatic.  Follows with Dr. Antoine Poche, cardiology.  -Continue home Coreg 12.5 twice daily, Imdur 30 daily -Continue home atorvastatin 20 daily at bedtime -Will restart home quinapril-HCTZ if needed -Hold home ASA while on heparin  Microcytic anemia: Chronic, stable. Hgb 12.3 > 9.9 > ?  Today, baseline around 11.  Would expect decrease in the setting of vascular procedures as above. -Monitor CBC -Follow-up outpatient   Hypokalemia: Acute. K 3.1 >3.3 > 3.9. S/p kdur .  - continue to f/u morning BMPs  FEN/GI: N.p.o. for procedures Prophylaxis: Heparin gtt  Disposition: Progressive  Subjective:  Paul [name] was found resting comfortably in bed this morning, with his breakfast tray in front of him.  He reports sleeping okay overnight, says that his pain is controlled.  He has no complaints.  Objective: Temp:  [97 F (36.1 C)-99.3 F (37.4 C)] 99.3 F (37.4 C) (07/11 0650) Pulse Rate:  [77-94] 93 (07/11 0650) Resp:  [11-23] 17 (07/11 0650) BP: (116-181)/(80-118) 122/88 (07/11 0650) SpO2:  [95 %-100 %] 100 % (07/11 0650) Weight:  [71.6 kg] 71.6 kg (07/11 0223)  Physical Exam: General: Awake, alert, conversational Cardiovascular: Regular rate and rhythm, no murmurs or gallops appreciated Respiratory: Clear to  auscultation bilaterally Extremities: No BLE edema, right foot warm with  palpable pedal pulse.  Left foot a little cooler, both pedal and pretibial pulses found with Doppler.  On right foot, can wiggle toes and move it.  Left foot range of motion remains diminished.  Laboratory: Recent Labs  Lab 02/16/20 2044 02/17/20 2047  WBC 6.9  --   HGB 12.3* 9.9*  HCT 41.7 29.0*  PLT 341  --    Recent Labs  Lab 02/16/20 2044 02/17/20 2047 02/18/20 0600  NA 136 142 136  K 3.1* 3.3* 3.9  CL 99 102 102  CO2 27  --  22  BUN 12 13 14   CREATININE 1.19 0.70 1.00  CALCIUM 8.8*  --  8.2*  PROT 8.3*  --  7.3  BILITOT 0.9  --  0.9  ALKPHOS 48  --  37*  ALT 21  --  18  AST 49*  --  61*  GLUCOSE 126* 107* 150*    Imaging/Diagnostic Tests: No results found.   , MD 02/18/2020, 7:47 AM PGY-1, [place] [facility] FPTS Intern pager: [phone], text pages welcome

## 2020-02-18 NOTE — Evaluation (Signed)
Physical Therapy Evaluation Patient Details Name: Paul [name]. MRN: [id number] DOB: 01-23-1971 Today's Date: 02/18/2020   History of Present Illness  Pt is a 49 y/o male with a PMH significant for TB, PTSD, MI, HTN, DM, ischemic cardiomyopathy with EF 45%, CAD, bipolar disorder, COVID-19 in 10/2019. Pt presented with worsening leg weakness and numbness, found to have critical limb ischemia and is now s/p bilateral thrombectomy and L fasciotomy on 02/27/2020.     Clinical Impression  Pt admitted with above diagnosis. At the time of PT eval pt was able to perform transfers and ambulation with gross min guard assist and RW for support. Pt currently with functional limitations due to the deficits listed below (see PT Problem List). Pt will benefit from skilled PT to increase their independence and safety with mobility to allow discharge to the venue listed below.       Follow Up Recommendations Home health PT;Supervision for mobility/OOB    Equipment Recommendations  Rolling walker with 5" wheels    Recommendations for Other Services       Precautions / Restrictions Precautions Precautions: Fall Restrictions Weight Bearing Restrictions: No      Mobility  Bed Mobility               General bed mobility comments: Pt was received sitting EOB with OT present upon PT arrival.   Transfers Overall transfer level: Needs assistance Equipment used: Rolling walker (2 wheeled) Transfers: Sit to/from Stand Sit to Stand: Min guard         General transfer comment: Close guard as pt powered up to full standing position. VC's for hand placement on seated surface for safety however pt with BUE's on the walker anyway.   Ambulation/Gait Ambulation/Gait assistance: Min guard Gait Distance (Feet): 25 Feet Assistive device: Rolling walker (2 wheeled) Gait Pattern/deviations: Step-through pattern;Decreased stride length;Trunk flexed;Decreased dorsiflexion - left Gait velocity:  Decreased Gait velocity interpretation: <1.31 ft/sec, indicative of household ambulator General Gait Details: VC's for improved posture and increased heel strike on the L. Pt able to make minimal corrective changes and appeared to fatigue quickly with ambulation.   Stairs            Wheelchair Mobility    Modified Rankin (Stroke Patients Only)       Balance Overall balance assessment: Needs assistance Sitting-balance support: Feet supported;No upper extremity supported Sitting balance-Leahy Scale: Fair     Standing balance support: Bilateral upper extremity supported Standing balance-Leahy Scale: Poor Standing balance comment: Reliant on UE support - likely for pain control as well as balance.                             Pertinent Vitals/Pain Pain Assessment: 0-10 Pain Score: 7  Pain Location: At beginning of session. Pt reports 5/10 at end of session.  Pain Descriptors / Indicators: Operative site guarding Pain Intervention(s): Limited activity within patient's tolerance;Monitored during session;Repositioned    Home Living Family/patient expects to be discharged to:: Private residence Living Arrangements: Non-relatives/Friends Available Help at Discharge: Friend(s);Available PRN/intermittently   Home Access: Stairs to enter   Entrance Stairs-Number of Steps: 1 Home Layout: One level        Prior Function Level of Independence: Independent               Hand Dominance        Extremity/Trunk Assessment   Upper Extremity Assessment Upper Extremity Assessment: Defer to OT evaluation  Lower Extremity Assessment Lower Extremity Assessment: Generalized weakness;LLE deficits/detail (Consistent with above mentioned procedure) LLE Deficits / Details: decreased DF in the L foot LLE Sensation: decreased light touch LLE Coordination: decreased gross motor    Cervical / Trunk Assessment Cervical / Trunk Assessment: Normal  Communication    Communication: No difficulties  Cognition Arousal/Alertness: Awake/alert Behavior During Therapy: WFL for tasks assessed/performed Overall Cognitive Status: Within Functional Limits for tasks assessed                                        General Comments      Exercises     Assessment/Plan    PT Assessment Patient needs continued PT services  PT Problem List Decreased strength;Decreased activity tolerance;Decreased mobility;Decreased balance;Decreased knowledge of use of DME;Decreased safety awareness;Decreased knowledge of precautions;Impaired sensation;Pain       PT Treatment Interventions DME instruction;Gait training;Functional mobility training;Therapeutic activities;Therapeutic exercise;Neuromuscular re-education;Patient/family education    PT Goals (Current goals can be found in the Care Plan section)  Acute Rehab PT Goals Patient Stated Goal: None stated PT Goal Formulation: With patient Time For Goal Achievement: 02/25/20 Potential to Achieve Goals: Good    Frequency Min 3X/week   Barriers to discharge        Co-evaluation               AM-PAC PT "6 Clicks" Mobility  Outcome Measure Help needed turning from your back to your side while in a flat bed without using bedrails?: None Help needed moving from lying on your back to sitting on the side of a flat bed without using bedrails?: A Little Help needed moving to and from a bed to a chair (including a wheelchair)?: A Little Help needed standing up from a chair using your arms (e.g., wheelchair or bedside chair)?: A Little Help needed to walk in hospital room?: A Little Help needed climbing 3-5 steps with a railing? : A Little 6 Click Score: 19    End of Session   Activity Tolerance: Patient tolerated treatment well Patient left: in chair;with call bell/phone within reach Nurse Communication: Mobility status PT Visit Diagnosis: Difficulty in walking, not elsewhere classified  (R26.2);Pain Pain - Right/Left:  (bilateral) Pain - part of body: Leg    Time: 0174-9449 PT Time Calculation (min) (ACUTE ONLY): 23 min   Charges:   PT Evaluation $PT Eval Moderate Complexity: 1 Mod          Conni Slipper, PT, DPT Acute Rehabilitation Services Pager: [phone] Office: [phone]   Marylynn Pearson 02/18/2020, 3:50 PM

## 2020-02-19 ENCOUNTER — Encounter (HOSPITAL_COMMUNITY): Payer: Self-pay | Admitting: Surgery

## 2020-02-19 ENCOUNTER — Inpatient Hospital Stay (HOSPITAL_COMMUNITY): Payer: Medicare Other

## 2020-02-19 DIAGNOSIS — I361 Nonrheumatic tricuspid (valve) insufficiency: Secondary | ICD-10-CM

## 2020-02-19 LAB — URINALYSIS, ROUTINE W REFLEX MICROSCOPIC
Bacteria, UA: NONE SEEN
Bilirubin Urine: NEGATIVE
Glucose, UA: >=500 mg/dL — AB
Ketones, ur: 5 mg/dL — AB
Leukocytes,Ua: NEGATIVE
Nitrite: NEGATIVE
Protein, ur: NEGATIVE mg/dL
Specific Gravity, Urine: 1.025 (ref 1.005–1.030)
pH: 6 (ref 5.0–8.0)

## 2020-02-19 LAB — BASIC METABOLIC PANEL
Anion gap: 6 (ref 5–15)
BUN: 12 mg/dL (ref 6–20)
CO2: 25 mmol/L (ref 22–32)
Calcium: 8.2 mg/dL — ABNORMAL LOW (ref 8.9–10.3)
Chloride: 104 mmol/L (ref 98–111)
Creatinine, Ser: 0.95 mg/dL (ref 0.61–1.24)
GFR calc Af Amer: >60 mL/min (ref >60)
GFR calc non Af Amer: >60 mL/min (ref >60)
Glucose, Bld: 152 mg/dL — ABNORMAL HIGH (ref 70–99)
Potassium: 3.5 mmol/L (ref 3.5–5.1)
Sodium: 135 mmol/L (ref 135–145)

## 2020-02-19 LAB — CBC
HCT: 25.4 % — ABNORMAL LOW (ref 39.0–52.0)
Hemoglobin: 7.7 g/dL — ABNORMAL LOW (ref 13.0–17.0)
MCH: 23.2 pg — ABNORMAL LOW (ref 26.0–34.0)
MCHC: 30.3 g/dL (ref 30.0–36.0)
MCV: 76.5 fL — ABNORMAL LOW (ref 80.0–100.0)
Platelets: 232 10*3/uL (ref 150–400)
RBC: 3.32 MIL/uL — ABNORMAL LOW (ref 4.22–5.81)
RDW: 19.3 % — ABNORMAL HIGH (ref 11.5–15.5)
WBC: 9.8 10*3/uL (ref 4.0–10.5)
nRBC: 0.2 % (ref 0.0–0.2)

## 2020-02-19 LAB — GLUCOSE, CAPILLARY
Glucose-Capillary: 146 mg/dL — ABNORMAL HIGH (ref 70–99)
Glucose-Capillary: 157 mg/dL — ABNORMAL HIGH (ref 70–99)
Glucose-Capillary: 126 mg/dL — ABNORMAL HIGH (ref 70–99)
Glucose-Capillary: 163 mg/dL — ABNORMAL HIGH (ref 70–99)
Glucose-Capillary: 112 mg/dL — ABNORMAL HIGH (ref 70–99)

## 2020-02-19 LAB — PREPARE RBC (CROSSMATCH)

## 2020-02-19 LAB — ECHOCARDIOGRAM LIMITED
Height: 71 in
Weight: 2525.59 oz

## 2020-02-19 LAB — TYPE AND SCREEN
ABO/RH(D): B POS
Antibody Screen: NEGATIVE

## 2020-02-19 MED ORDER — ATORVASTATIN CALCIUM 40 MG PO TABS
40.0000 mg | ORAL_TABLET | Freq: Every day | ORAL | Status: DC
  Administered 2020-02-19 – 2020-02-21 (×3): 40 mg via ORAL
  Filled 2020-02-19 (×3): qty 1

## 2020-02-19 MED ORDER — SODIUM CHLORIDE 0.9% IV SOLUTION: Freq: Once | INTRAVENOUS | Status: DC

## 2020-02-19 NOTE — Progress Notes (Signed)
Family Medicine Teaching Service Daily Progress Note Intern Pager: [phone]  Patient name: Paul [name]. Medical record number: [id number] Date of birth: Apr 19, 1971 Age: 49 y.o. Gender: male  Primary Care Provider: Unknown Jim, DO Consultants: VVS Code Status: Full  Pt Overview and Major Events to Date:  Admitted 7/10 7/10 AM bilateral femoral thrombectomy and LLE fasciotomy 7/10 PM successful recanalization of left popliteal artery posterior tibial artery, closure of LLE fasciotomy  Assessment and Plan: Paul [name] is a 49 year old male presenting with worsening leg weakness and numbness, found to have critical limb ischemia.  Covid positive upon admission. PMH significant for type 2 diabetes, CAD, hypertension, recent COVID-19 illness in 3/21, hyperlipidemia, cardiomyopathy.  Microcytic anemia: Chronic. Hgb 12.3 > 9.9 >  7.7 today. Baseline around 11.  Will order 1 unit of blood for transfusion today. -Follow-up post H&H -Monitor CBC -Follow-up outpatient   Critical limb ischemia, s/p thrombectomy: Acute. Postop day #2.  Vascular surgery recommends aspirin, Plavix, and statin for PAD; PT recommendations and clearance; Lovenox and Protonix for prophylaxis. -Plavix 75 mg daily, aspirin 81 mg daily, Plavix 25 mg daily -Percocet 5-325 mg every 4 as needed (moderate pain), morphine 2 g daily as needed (severe pain)  L renal mass seen on CT: Acute. Masslike lesion within lower left kidney pole measuring 2.3 x 1.6 cm, neoplasm suspected.  Edema related to pyelonephritis versus infarct.  Creatinine baseline estimated 1.19. Admission CR 1.19 > 0.70 > 1.00 > 0.95.  MRI abdomen this morning showed patchy areas of decreased perfusion in the left kidney, more likely congruent with pyelonephritis?  No discrete mass or evidence of an infiltrative neoplasm. -Follow-up urinalysis and urine culture as recommended by radiologist s/p MRI  Recent COVID-19 illness: Stable. Covid  PCR on admit still positive, do not have a negative test in between known Covid illness requiring hospitalization in 10/2019. Currently asymptomatic, suspect continuation from illness several months ago. May contribute to hypercoagulable state leading to critical limb ischemia as above. -Should not need precautions  T2DM: Chronic, stable. A1c 7.7 in 01/2020. Admission glucose 126 > 107 > 150 > 152. Takes Metformin, Jardiance, and Lantus 15 Uat home. -SSI -CBGs with meals/nightly -Monitor BMP -Hold homemedications as above, can consider adding Lantus if CBGs uncontrolled  Hypertension: Chronic, stable. Systolic generally 100-120s.  Episode of hypotension overnight 80/57, responded well to 500 mL bolus. Takes Imdur, Coreg, and quinapril-HCTZ at home. -Monitor BP -Continue home Imdur 30 mg daily and Coreg 12.5 mg twice daily with meals -Will restart Enalapril-HCTZ if needed for further control  BipolarIIdisorder: Chronic, stable. Mood appropriate. Takes Celexa 20 mg twice daily and Abilify 5 mg daily. -Continue home regimen  CAD HFmrEF w/ EF 40-45%: Chronic, stable. Asymptomatic. Follows with Dr. Hochrein,cardiology. -Continue home Coreg 12.5 twice daily,Imdur 30 daily -Continue home atorvastatin 20 daily at bedtime -Will restart home quinapril-HCTZ if needed -Hold home ASA while on heparin  Hypokalemia: Acute. K 3.1 >3.3 > Kdur > 3.9 > 3.5. - continue to f/u morning BMPs  FEN/GI: Carb modified Prophylaxis:Heparingtt  FEN/GI: Carb modified PPx: Lovenox 40 mg subcu every 24  Disposition: Progressive  Subjective:  Paul [name] was found laying in bed comfortably this morning.  He was in a good mood and said that he was feeling well, got some sleep last night.  He reports that he has a productive cough, recently with red-tinged phlegm.  No hemoptysis, BRBPR, dark or tarry stools.  No fever, chills, nausea, vomiting.  Normal bowel movement and  urination.   Feels as though his left foot has improved and function since yesterday.  Objective: Temp:  [98.3 F (36.8 C)-99.5 F (37.5 C)] 99.5 F (37.5 C) (07/12 1200) Pulse Rate:  [86-96] 89 (07/12 1200) Resp:  [15-21] 18 (07/12 1200) BP: (80-127)/(57-87) 121/82 (07/12 1200) SpO2:  [91 %-100 %] 100 % (07/12 1200)  Physical Exam: General: Awake, alert, oriented Cardiovascular: Regular rate and rhythm, no murmurs auscultated, no BLE edema Respiratory: Lungs clear to auscultation bilaterally Extremities: Pedal and pretibial pulses palpable on the right, dopplerable on the left.  Both stronger than yesterday.  Has increased ROM in left foot.  Laboratory: Recent Labs  Lab 02/16/20 2044 02/17/20 2047 02/19/20 0245  WBC 6.9  --  9.8  HGB 12.3* 9.9* 7.7*  HCT 41.7 29.0* 25.4*  PLT 341  --  232   Recent Labs  Lab 02/16/20 2044 02/16/20 2044 02/17/20 2047 02/18/20 0600 02/19/20 0245  NA 136   < > 142 136 135  K 3.1*   < > 3.3* 3.9 3.5  CL 99   < > 102 102 104  CO2 27  --   --  22 25  BUN 12   < > 13 14 12   CREATININE 1.19   < > 0.70 1.00 0.95  CALCIUM 8.8*  --   --  8.2* 8.2*  PROT 8.3*  --   --  7.3  --   BILITOT 0.9  --   --  0.9  --   ALKPHOS 48  --   --  37*  --   ALT 21  --   --  18  --   AST 49*  --   --  61*  --   GLUCOSE 126*   < > 107* 150* 152*   < > = values in this interval not displayed.    Imaging/Diagnostic Tests: MR ABDOMEN W WO CONTRAST  Result Date: 02/19/2020 CLINICAL DATA:  Left renal lesions seen on recent CT scan. EXAM: MRI ABDOMEN WITHOUT AND WITH CONTRAST TECHNIQUE: Multiplanar multisequence MR imaging of the abdomen was performed both before and after the administration of intravenous contrast. CONTRAST:  90mL GADAVIST GADOBUTROL 1 MMOL/ML IV SOLN COMPARISON:  None. FINDINGS: Lower chest: The lung bases are grossly clear. The heart is borderline enlarged. No pleural or pericardial effusion. Hepatobiliary: No hepatic lesions or intrahepatic biliary  dilatation. The gallbladder is unremarkable. The portal and hepatic veins are patent. Normal caliber and course of the common bile duct. Pancreas:  No mass, inflammation or ductal dilatation. Spleen:  Normal size. No focal lesions. Adrenals/Urinary Tract: Adrenal glands and right kidney are unremarkable. Small linear band of scarring type change noted in the midpole region of the right kidney. The left kidney demonstrates patchy decreased perfusion involving the medial cortex at the midpole lower pole junction region. Another smaller area of decreased perfusion is noted in the upper pole posteriorly. This is less evident on the more delayed post-contrast images and is most likely an area of pyelonephritis. I do not see a discrete mass or evidence of an infiltrating neoplasm. No hydronephrosis. Stomach/Bowel: The stomach and duodenum are unremarkable. There are moderately thickened small bowel loops in the left upper quadrant along with some fluid-filled slightly distended small bowel loops. The visualized colon is grossly normal. Vascular/Lymphatic: The aorta and branch vessels are patent. No mesenteric or retroperitoneal mass or adenopathy. Other:  No ascites or abdominal wall hernia. Musculoskeletal: No significant bony findings. IMPRESSION: 1. Patchy areas of  decreased perfusion involving the left kidney as detailed above. This is most likely pyelonephritis. No discrete mass or evidence of an infiltrating neoplasm. Recommend correlation with urinalysis. A follow-up CT or MRI after appropriate treatment may be helpful to make sure this completely resolves. 2. Thickened and dilated small bowel loops in the left upper quadrant suggesting an inflammatory or infectious enteritis. Electronically Signed   By: Rudie Meyer M.D.   On: 02/19/2020 05:46     Fayette Pho, MD 02/19/2020, 4:36 PM PGY-1, [facility] FPTS Intern pager: [phone], text pages welcome

## 2020-02-19 NOTE — Progress Notes (Signed)
  Echocardiogram 2D Echocardiogram has been performed.  Delcie Roch 02/19/2020, 4:09 PM

## 2020-02-19 NOTE — Progress Notes (Signed)
Pt's SBP was in 80's and low 90's. Denied any dizziness . Placed pt in trendelenburg position, bp came up to 90's. 500 cc NS bolus administered as ordered. Pt's o2 sat was dropping to 88-89% in RA, 1l o2 applied via [place]. o2 sat came back to 97%. BP after bolus was 110/80. Will continue to monitor.

## 2020-02-19 NOTE — Plan of Care (Signed)
Poc progressing.  

## 2020-02-19 NOTE — Progress Notes (Signed)
    Subjective  - POD #2  Low SBP treated with IVF Walked yesterday   Physical Exam:  Slight improvement in motor function of left foot Palpable left PT Compartments soft   Assessment/Plan:  POD #2  ASA, Plavix, and statin for PAD Needs PT and assistance with ambulation Will await final recs for PT. He has fallen several times at home in the past several months.  MAy be a candidate for rehab OK for discharge when cleared by PT and medical issues resolved Acute blood loss anemia:  Hb 7.7.  Continue to monitor Lovenox and protonix for prophylaxis  Wells Odean Mcelwain 02/19/2020 7:53 AM --  Vitals:   02/19/20 0451 02/19/20 0701  BP: 113/75 124/87  Pulse: 88 87  Resp: 20 20  Temp: 98.9 F (37.2 C) 98.3 F (36.8 C)  SpO2: 100% 100%    Intake/Output Summary (Last 24 hours) at 02/19/2020 0753 Last data filed at 02/19/2020 0727 Gross per 24 hour  Intake 1420 ml  Output 1650 ml  Net -230 ml     Laboratory CBC    Component Value Date/Time   WBC 9.8 02/19/2020 0245   HGB 7.7 (L) 02/19/2020 0245   HGB 13.1 07/14/2019 1634   HCT 25.4 (L) 02/19/2020 0245   HCT 40.9 07/14/2019 1634   PLT 232 02/19/2020 0245   PLT 390 07/14/2019 1634    BMET    Component Value Date/Time   NA 135 02/19/2020 0245   NA 138 07/14/2019 1634   K 3.5 02/19/2020 0245   CL 104 02/19/2020 0245   CO2 25 02/19/2020 0245   GLUCOSE 152 (H) 02/19/2020 0245   BUN 12 02/19/2020 0245   BUN 17 07/14/2019 1634   CREATININE 0.95 02/19/2020 0245   CALCIUM 8.2 (L) 02/19/2020 0245   GFRNONAA >60 02/19/2020 0245   GFRAA >60 02/19/2020 0245    COAG Lab Results  Component Value Date   INR 1.1 (H) 08/24/2011   No results found for: PTT  Antibiotics Anti-infectives (From admission, onward)   Start     Dose/Rate Route Frequency Ordered Stop   02/17/20 1600  ceFAZolin (ANCEF) IVPB 2g/100 mL premix        2 g 200 mL/hr over 30 Minutes Intravenous Every 8 hours 02/17/20 1439 02/18/20 0759        V. Charlena Cross, M.D., [facility] [place] Office: [phone] Pager:  [phone]

## 2020-02-19 NOTE — Progress Notes (Signed)
Occupational Therapy Treatment Patient Details Name: Paul [name]. MRN: [id number] DOB: 1970/11/07 Today's Date: 02/19/2020    History of present illness 49 y.o. male presenting with critical limb ischemia s/p bilateral thrombectomy and L fasciotomy on 02/27/2020. PMHx significant for TB, PTSD, MI, HTN, and DM.     OT comments  Patient making steady progress toward goals demonstrating increased independence with self-care tasks this date as detailed below. Patient able to don footwear with Min A seated EOB. Patient ambulated from EOB to commode in bathroom with RW and Min guard. Patient completed toileting/hygiene/clothing management in sitting standing with Min A and slightly increased time. Seated at sink level, patient completed oral hygiene, hand washing and face washing with set-up assist. Patient continues to be limited by decreased activity tolerance and SOB with activity warranting continued acute OT services in prep for safe d/c home.    Follow Up Recommendations  No OT follow up;Supervision - Intermittent    Equipment Recommendations  3 in 1 bedside commode;Tub/shower bench    Recommendations for Other Services      Precautions / Restrictions Precautions Precautions: Fall Restrictions Weight Bearing Restrictions: No       Mobility Bed Mobility Overal bed mobility: Needs Assistance Bed Mobility: Sit to Supine       Sit to supine: Supervision      Transfers Overall transfer level: Needs assistance Equipment used: Rolling walker (2 wheeled) Transfers: Sit to/from [employer] Sit to Stand: Min guard Stand pivot transfers: Min guard       General transfer comment: Min guard for STS from EOB to RW and from standard commode to RW. SPT to commode with Min guard and RW. Occasional cues for hand placement.     Balance Overall balance assessment: Needs assistance Sitting-balance support: Feet supported Sitting balance-Leahy Scale: Good      Standing balance support: Bilateral upper extremity supported Standing balance-Leahy Scale: Fair                             ADL either performed or assessed with clinical judgement   ADL       Grooming: Wash/dry hands;Wash/dry face;Oral care;Sitting;Set up                   Toilet Transfer: Minimal assistance;Ambulation;Comfort height toilet;RW   Toileting- Clothing Manipulation and Hygiene: Minimal assistance;Sit to/from stand       Functional mobility during ADLs: Health and safety inspector     Praxis      Cognition Arousal/Alertness: Awake/alert Behavior During Therapy: WFL for tasks assessed/performed Overall Cognitive Status: Within Functional Limits for tasks assessed                                          Exercises     Shoulder Instructions       General Comments      Pertinent Vitals/ Pain       Pain Assessment: 0-10 Pain Score: 5  Pain Location: LLE Pain Descriptors / Indicators: Operative site guarding Pain Intervention(s): Monitored during session  Home Living  Prior Functioning/Environment              Frequency  Min 2X/week        Progress Toward Goals  OT Goals(current goals can now be found in the care plan section)  Progress towards OT goals: Progressing toward goals  Acute Rehab OT Goals Patient Stated Goal: To return home OT Goal Formulation: With patient Time For Goal Achievement: 03/03/20 Potential to Achieve Goals: Good ADL Goals Pt Will Perform Grooming: with modified independence;standing Pt Will Perform Lower Body Bathing: with modified independence;sit to/from stand;with adaptive equipment Pt Will Perform Lower Body Dressing: with modified independence;sit to/from stand;with adaptive equipment Pt Will Transfer to Toilet: with modified independence;ambulating;regular height  toilet;bedside commode;grab bars Pt Will Perform Toileting - Clothing Manipulation and hygiene: with modified independence;sit to/from stand Pt Will Perform Tub/Shower Transfer: Tub transfer;with supervision;ambulating;tub bench;rolling walker  Plan Discharge plan remains appropriate    Co-evaluation                 AM-PAC OT "6 Clicks" Daily Activity     Outcome Measure   Help from another person eating meals?: None Help from another person taking care of personal grooming?: A Little Help from another person toileting, which includes using toliet, bedpan, or urinal?: A Little Help from another person bathing (including washing, rinsing, drying)?: A Little Help from another person to put on and taking off regular upper body clothing?: A Little Help from another person to put on and taking off regular lower body clothing?: A Lot 6 Click Score: 18    End of Session Equipment Utilized During Treatment: Gait belt;Rolling walker  OT Visit Diagnosis: Unsteadiness on feet (R26.81);Muscle weakness (generalized) (M62.81);Pain Pain - Right/Left: Left Pain - part of body: Leg   Activity Tolerance Patient tolerated treatment well   Patient Left in chair;with chair alarm set;with call bell/phone within reach   Nurse Communication          Time: 1420-1500 OT Time Calculation (min): 40 min  Charges: OT General Charges $OT Visit: 1 Visit OT Treatments $Self Care/Home Management : 38-52 mins  Destanae H. OTR/L Supplemental OT, Department of rehab services [phone]   Destanae R H. 02/19/2020, 3:10 PM

## 2020-02-19 NOTE — TOC Initial Note (Signed)
Transition of Care (TOC) - Initial/Assessment Note  Sander Radon, BSN Transitions of Care Unit 4E- RN Case Manager See Treatment Team for direct phone #    Patient Details  Name: Paul [name]. MRN: [id number] Date of Birth: Dec 22, 1970  Transition of Care [facility]) CM/SW Contact:    Darrold Span, RN Phone Number: 02/19/2020, 3:08 PM  Clinical Narrative:                 Pt from home with spouse, plan to return home, order for HHPT placed, call made to pt for transition of care needs. Pt agreeable to [facility] services and states he will need recommended DME- RW and 3n1- discussed with pt having DME delivered to room by inhouse provider [employer]- pt agreeable to this- will make call to [employer] prior to discharge for DME delivery.  Choice offered for [facility] agency- offered list Per CMS guidelines from medicare.gov website with star ratings (copy placed in shadow chart)- per pt he does not have a preference and asked this Clinical research associate to find an agency to provide needed services. Will reach out to local agencies to secure one for HHPT.      Expected Discharge Plan: Home w Home Health Services Barriers to Discharge: Continued Medical Work up   Patient Goals and CMS Choice Patient states their goals for this hospitalization and ongoing recovery are:: return home CMS Medicare.gov Compare Post Acute Care list provided to:: Patient Choice offered to / list presented to : Patient  Expected Discharge Plan and Services Expected Discharge Plan: Home w Home Health Services   Discharge Planning Services: CM Consult Post Acute Care Choice: Home Health, Durable Medical Equipment Living arrangements for the past 2 months: Apartment                 DME Arranged: 3-N-1, Walker rolling DME Agency: [facility]       HH Arranged: PT          Prior Living Arrangements/Services Living arrangements for the past 2 months: Apartment Lives with:: Self, Spouse Patient language and need for  interpreter reviewed:: Yes Do you feel safe going back to the place where you live?: Yes      Need for Family Participation in Patient Care: Yes (Comment) Care giver support system in place?: Yes (comment)   Criminal Activity/Legal Involvement Pertinent to Current Situation/Hospitalization: No - Comment as needed  Activities of Daily Living      Permission Sought/Granted Permission sought to share information with : Oceanographer granted to share information with : Yes, Verbal Permission Granted     Permission granted to share info w AGENCY: DME and HH agencies        Emotional Assessment Appearance:: Appears stated age Attitude/Demeanor/Rapport: Engaged Affect (typically observed): Appropriate, Pleasant Orientation: : Oriented to Self, Oriented to Place, Oriented to  Time, Oriented to Situation Alcohol / Substance Use: Not Applicable Psych Involvement: No (comment)  Admission diagnosis:  Critical lower limb ischemia [I99.8] Sensation of cold in leg [R20.9] Patient Active Problem List   Diagnosis Date Noted  . Renal mass   . Sensation of cold in leg 02/17/2020  . Critical lower limb ischemia 02/17/2020  . Type 2 diabetes mellitus with complication, with long-term current use of insulin (HCC) 02/08/2020  . Pierced lip infection 01/16/2020  . Shortness of breath 12/31/2019  . Hospital discharge follow-up 11/08/2019  . Pneumonia due to COVID-19 virus 11/04/2019  . Neck mass 07/14/2019  . Hypotension 02/20/2019  .  PTSD (post-traumatic stress disorder) 12/01/2018  . Cardiomyopathy (HCC) 01/14/2018  . Diabetic neuropathy (HCC) 01/10/2018  . Nausea alone 02/28/2013  . Cold sensation of skin 07/29/2012  . Right hand pain 05/03/2012  . Hypertension   . CAD (coronary artery disease)   . Hyperlipidemia LDL goal <100   . Stented coronary artery 04/30/2011  . Atopic dermatitis 08/19/2010  . Anxiety state 09/27/2009  . Diabetes mellitus type 2,  uncontrolled (HCC) 09/19/2009   PCP:  Unknown Jim, DO Pharmacy:   [facility] [place], [place] - [address] [place] [address] Phone: [phone] Fax: [phone]  [facility] - [address] [place]), [place] - [address] [place] [address] Phone: [phone] Fax: [phone]     Social Determinants of Health (SDOH) Interventions    Readmission Risk Interventions No flowsheet data found.

## 2020-02-20 ENCOUNTER — Encounter (HOSPITAL_COMMUNITY): Payer: Self-pay | Admitting: Family Medicine

## 2020-02-20 LAB — CBC
HCT: 28.7 % — ABNORMAL LOW (ref 39.0–52.0)
Hemoglobin: 8.8 g/dL — ABNORMAL LOW (ref 13.0–17.0)
MCH: 24.4 pg — ABNORMAL LOW (ref 26.0–34.0)
MCHC: 30.7 g/dL (ref 30.0–36.0)
MCV: 79.5 fL — ABNORMAL LOW (ref 80.0–100.0)
Platelets: 237 10*3/uL (ref 150–400)
RBC: 3.61 MIL/uL — ABNORMAL LOW (ref 4.22–5.81)
RDW: 20 % — ABNORMAL HIGH (ref 11.5–15.5)
WBC: 9.6 10*3/uL (ref 4.0–10.5)
nRBC: 0.2 % (ref 0.0–0.2)

## 2020-02-20 LAB — SURGICAL PATHOLOGY

## 2020-02-20 LAB — GLUCOSE, CAPILLARY
Glucose-Capillary: 186 mg/dL — ABNORMAL HIGH (ref 70–99)
Glucose-Capillary: 175 mg/dL — ABNORMAL HIGH (ref 70–99)
Glucose-Capillary: 141 mg/dL — ABNORMAL HIGH (ref 70–99)
Glucose-Capillary: 186 mg/dL — ABNORMAL HIGH (ref 70–99)

## 2020-02-20 LAB — URINE CULTURE: Culture: <10000 — AB

## 2020-02-20 MED ORDER — EMPAGLIFLOZIN 25 MG PO TABS
25.0000 mg | ORAL_TABLET | Freq: Every day | ORAL | Status: DC
  Administered 2020-02-20 – 2020-02-22 (×3): 25 mg via ORAL
  Filled 2020-02-20 (×3): qty 1

## 2020-02-20 MED ORDER — SACUBITRIL-VALSARTAN 24-26 MG PO TABS
1.0000 | ORAL_TABLET | Freq: Two times a day (BID) | ORAL | Status: DC
  Administered 2020-02-20 – 2020-02-22 (×4): 1 via ORAL
  Filled 2020-02-20 (×4): qty 1

## 2020-02-20 NOTE — Progress Notes (Signed)
    Subjective  - POD #3  Says he is feeling better today He did walk to the bathroom.   Physical Exam:  Sensation is not present up to the mid calf.  Unchanged motor function in his foot from yesterday.  He can barely move his toes at the base. Palpable posterior tibial pulse Fasciotomy dressings removed.  There is no evidence of compartment syndrome.  Incisions are clean and dry.       Assessment/Plan:  POD #3  Discussed with Dr. Annia Friendly, given echo findings and possible embolic source, the patient will be on anticoagulation in addition to aspirin and Plavix given his drug-eluting stent    Wells Kenzie Flakes 02/20/2020 4:12 PM --  Vitals:   02/20/20 0800 02/20/20 1225  BP: (!) 142/98 140/89  Pulse: 77 78  Resp: 17 19  Temp: 98.9 F (37.2 C) 98.4 F (36.9 C)  SpO2: 100% 100%    Intake/Output Summary (Last 24 hours) at 02/20/2020 1612 Last data filed at 02/20/2020 1228 Gross per 24 hour  Intake 305 ml  Output 600 ml  Net -295 ml     Laboratory CBC    Component Value Date/Time   WBC 9.6 02/20/2020 0550   HGB 8.8 (L) 02/20/2020 0550   HGB 13.1 07/14/2019 1634   HCT 28.7 (L) 02/20/2020 0550   HCT 40.9 07/14/2019 1634   PLT 237 02/20/2020 0550   PLT 390 07/14/2019 1634    BMET    Component Value Date/Time   NA 135 02/19/2020 0245   NA 138 07/14/2019 1634   K 3.5 02/19/2020 0245   CL 104 02/19/2020 0245   CO2 25 02/19/2020 0245   GLUCOSE 152 (H) 02/19/2020 0245   BUN 12 02/19/2020 0245   BUN 17 07/14/2019 1634   CREATININE 0.95 02/19/2020 0245   CALCIUM 8.2 (L) 02/19/2020 0245   GFRNONAA >60 02/19/2020 0245   GFRAA >60 02/19/2020 0245    COAG Lab Results  Component Value Date   INR 1.1 (H) 08/24/2011   No results found for: PTT  Antibiotics Anti-infectives (From admission, onward)   Start     Dose/Rate Route Frequency Ordered Stop   02/17/20 1600  ceFAZolin (ANCEF) IVPB 2g/100 mL premix        2 g 200 mL/hr over 30 Minutes Intravenous Every  8 hours 02/17/20 1439 02/18/20 0759       V. Charlena Cross, M.D., [facility] [place] Office: [phone] Pager:  [phone]

## 2020-02-20 NOTE — Plan of Care (Signed)

## 2020-02-20 NOTE — Progress Notes (Signed)
Patient currently here for bilateral critical limb ischemia, s/p bilateral thrombectomy and fasciotomy of the left.  Unclear etiology, ?Embolic source vs progressive PAD.   Noted decrease in EF to 25-30% with severely reduced RV function on echo, compared to previous EF 45% in 2019.  No evidence of PE on CTA 7/10.  Does have history of MI, appears to have had a catheterization back in 2011/2013 however cannot find with in epic system.  Follows with Dr. Antoine Poche.  Spoke with Dr. Flora Lipps, cardiology, who recommended repeating a limited echo with IV contrast to rule out LV thrombus and to highly consider TEE to rule out embolic source, especially given young age with such significant findings.  Also recommended placing official cardiology consult for additional assistance.  Appreciated recommendations, placed consult and new echo limited order.   Allayne Stack, DO

## 2020-02-20 NOTE — Progress Notes (Signed)
Family Medicine Teaching Service Daily Progress Note Intern Pager: [phone]  Patient name: Paul [name]. Medical record number: [id number] Date of birth: [birth year] Age: 49 y.o. Gender: male  Primary Care Provider: Unknown Jim, DO Consultants: BVS Code Status: Full  Pt Overview and Major Events to Date:  Admitted 7/10 7/10 AM bilateral femoral thrombectomy and LLE fasciotomy 7/10 PM successful recanalization of left popliteal artery, posterior tibial artery, closure of LLE fasciotomy  Assessment and Plan: Paul [name] is a 49 year old male presenting with worsening leg weakness and numbness, found to have critical limb ischemia.  Covid positive on admission.  PMH significant for type 2 diabetes, CAD, hypertension, recent COVID-19 illness and 3/21, hyperlipidemia, cardiomyopathy.  Microcytic anemia: Chronic. Baseline hemoglobin 11.  Admission Hgb 12.3>9.9 > 7.7 > 1 unit of blood > 8.8. -Continue to monitor CBC -Follow-up outpatient   Critical limb ischemia, s/p thrombectomy: Acute. Postop day #3.  Vascular surgery recommends aspirin, Plavix, and statin for PAD; PT recommendations and clearance; Lovenox and Protonix for prophylaxis.  -Plavix 75 mg daily, aspirin 81 mg daily per VVS recommendations -Atorvastatin 40 mg daily at bedtime -Tylenol 650 mg every 6 as needed -Percocet 5-325 mg every 4 as needed (moderate pain), morphine 2 g daily as needed (severe pain)  L renal mass seen on CT: Acute. Urine culture showed insignificant growth of less than 10,000 colonies per mL. Urinalysis negative for infection, however does show ketones 5 and glucose greater than 500.  Recent COVID-19 illness: Stable. Covid PCR on admit still positive, do not have a negative test in between known Covid illness requiring hospitalization in 10/2019. Currently asymptomatic, suspect continuation from illness several months ago. May contribute to hypercoagulable state leading to  critical limb ischemia as above. -Should not need precautions  T2DM: Chronic, stable. A1c 7.7 in 01/2020. Admission glucose 126 > 107 > 150 > 152. Takes Metformin, Jardiance, and Lantus 15 Uat home. -SSI -CBGs with meals/nightly -Monitor BMP -Added Jardiance 25 mg oral daily today, follow-up sugars  Hypertension: Chronic, stable. Systolic generally 100-120s.  Episode of hypotension overnight 80/57, responded well to 500 mL bolus. Takes Imdur, Coreg, and quinapril-HCTZ at home. -Monitor BP -Continue home Imdur30 mg dailyand Coreg12.5 mg twice daily with meals -Will restart Enalapril-HCTZ if needed for further control  BipolarIIdisorder: Chronic, stable. Mood appropriate. Takes Celexa20 mg twice dailyand Abilify5 mg daily. -Continue home regimen  CAD HFmrEF w/ EF 40-45%: Chronic, stable. Asymptomatic. Follows with Dr. Hochrein,cardiology. Echo 7/12 shows worsening failure.  LVEF 25 to 30%, severely decreased function of left ventricle with global hypokinesis.  Severely reduced right ventricular systolic function, moderate tricuspid valve regurgitation.  Will consult cardiology. -Continue home Coreg12.5 twice daily,Imdur30daily -Continue home atorvastatin20 daily at bedtime -Will restart home quinapril-HCTZ if needed -Hold home ASA while on heparin -Consult cardiology, appreciate recs  Hypokalemia: Acute. K 3.1>3.3 > Kdur > 3.9 > 3.5. -continue to f/u morning BMPs   FEN/GI: Carb modified PPx: Lovenox 40 mg subcu every 24  Disposition: Progressive  Subjective:  Paul [name] was found sitting in bed with a blanket over his head.  Upon verbally greeting him, he pulled the blanket down and was alert, oriented, and pleasant.  He has no complaints his only request for today is that he get up and walk around with help and have a shower today.  He reports that his legs are doing well, and thinks his left foot is still improving.  Yesterday he had  mentioned coughing up red sputum, however he  says that this is not happened within the last 24 hours and it was a one-time thing.  Objective: Temp:  [98.4 F (36.9 C)-100.1 F (37.8 C)] 98.4 F (36.9 C) (07/13 1225) Pulse Rate:  [77-86] 78 (07/13 1225) Resp:  [15-20] 19 (07/13 1225) BP: (95-142)/(72-98) 140/89 (07/13 1225) SpO2:  [94 %-100 %] 100 % (07/13 1225)  Physical Exam: General: Awake, alert, oriented Cardiovascular: Regular rate and rhythm, no murmurs or rubs auscultated Respiratory: Lungs clear to auscultation bilaterally Extremities: No BLE edema, pulses dopplerable on right and left foot  Laboratory: Recent Labs  Lab 02/16/20 2044 02/16/20 2044 02/17/20 2047 02/19/20 0245 02/20/20 0550  WBC 6.9  --   --  9.8 9.6  HGB 12.3*   < > 9.9* 7.7* 8.8*  HCT 41.7   < > 29.0* 25.4* 28.7*  PLT 341  --   --  232 237   < > = values in this interval not displayed.   Recent Labs  Lab 02/16/20 2044 02/16/20 2044 02/17/20 2047 02/18/20 0600 02/19/20 0245  NA 136   < > 142 136 135  K 3.1*   < > 3.3* 3.9 3.5  CL 99   < > 102 102 104  CO2 27  --   --  22 25  BUN 12   < > 13 14 12   CREATININE 1.19   < > 0.70 1.00 0.95  CALCIUM 8.8*  --   --  8.2* 8.2*  PROT 8.3*  --   --  7.3  --   BILITOT 0.9  --   --  0.9  --   ALKPHOS 48  --   --  37*  --   ALT 21  --   --  18  --   AST 49*  --   --  61*  --   GLUCOSE 126*   < > 107* 150* 152*   < > = values in this interval not displayed.    Imaging/Diagnostic Tests: ECHOCARDIOGRAM LIMITED  Result Date: 02/19/2020    ECHOCARDIOGRAM LIMITED REPORT   Patient Name:   Paul CapriRoosevelt Barbier Jr. Date of Exam: 02/19/2020 Medical Rec #:  [id number]             Height:       71.0 in Accession #:    [id number]            Weight:       157.8 lb Date of Birth:  1971/05/01            BSA:          1.907 m Patient Age:    48 years              BP:           121/82 mmHg Patient Gender: M                     HR:           78 bpm. Exam Location:   Inpatient Procedure: Limited Color Doppler, Color Doppler and Limited Echo Indications:    Embolism and thrombosis of arteries of lower extremity  History:        Patient has prior history of Echocardiogram examinations, most                 recent 02/04/2018. Cardiomyopathy, CAD, Covid,                 Signs/Symptoms:Dyspnea; Risk Factors:Hypertension,  Diabetes and                 Dyslipidemia.  Sonographer:    Delcie Roch Referring Phys: [id number] Nada Libman IMPRESSIONS  1. Left ventricular ejection fraction, by estimation, is 25 to 30%. The left ventricle has severely decreased function. The left ventricle demonstrates global hypokinesis. There is mild left ventricular hypertrophy. Left ventricular diastolic parameters  are consistent with Grade I diastolic dysfunction (impaired relaxation).  2. Right ventricular systolic function is severely reduced. The right ventricular size is moderately enlarged. There is moderately elevated pulmonary artery systolic pressure.  3. The mitral valve is normal in structure. No evidence of mitral valve regurgitation. No evidence of mitral stenosis.  4. Tricuspid valve regurgitation is moderate.  5. The aortic valve is tricuspid. Aortic valve regurgitation is not visualized. No aortic stenosis is present.  6. The inferior vena cava is normal in size with greater than 50% respiratory variability, suggesting right atrial pressure of 3 mmHg. FINDINGS  Left Ventricle: Left ventricular ejection fraction, by estimation, is 25 to 30%. The left ventricle has severely decreased function. The left ventricle demonstrates global hypokinesis. The left ventricular internal cavity size was normal in size. There is mild left ventricular hypertrophy. Right Ventricle: The right ventricular size is moderately enlarged.Right ventricular systolic function is severely reduced. There is moderately elevated pulmonary artery systolic pressure. The tricuspid regurgitant velocity is 3.34 m/s, and with  an assumed right atrial pressure of 3 mmHg, the estimated right ventricular systolic pressure is 47.6 mmHg. Left Atrium: Left atrial size was normal in size. Right Atrium: Right atrial size was normal in size. Pericardium: Trivial pericardial effusion is present. Mitral Valve: The mitral valve is normal in structure. Normal mobility of the mitral valve leaflets. Mild mitral annular calcification. No evidence of mitral valve stenosis. Tricuspid Valve: The tricuspid valve is normal in structure. Tricuspid valve regurgitation is moderate . No evidence of tricuspid stenosis. Aortic Valve: The aortic valve is tricuspid. Aortic valve regurgitation is not visualized. No aortic stenosis is present. Pulmonic Valve: The pulmonic valve was normal in structure. Pulmonic valve regurgitation is mild. No evidence of pulmonic stenosis. Aorta: The aortic root is normal in size and structure. Venous: The inferior vena cava is normal in size with greater than 50% respiratory variability, suggesting right atrial pressure of 3 mmHg.  LEFT VENTRICLE PLAX 2D LVIDd:         4.40 cm      Diastology LVIDs:         3.10 cm      LV e' lateral:   8.38 cm/s LV PW:         1.20 cm      LV E/e' lateral: 7.4 LV IVS:        1.10 cm      LV e' medial:    4.90 cm/s LVOT diam:     2.30 cm      LV E/e' medial:  12.6 LV SV:         54 LV SV Index:   29 LVOT Area:     4.15 cm  LV Volumes (MOD) LV vol d, MOD A2C: 106.0 ml LV vol d, MOD A4C: 105.0 ml LV vol s, MOD A2C: 80.2 ml LV vol s, MOD A4C: 63.5 ml LV SV MOD A2C:     25.8 ml LV SV MOD A4C:     105.0 ml LV SV MOD BP:      36.3 ml RIGHT VENTRICLE  IVC RV S prime:     9.68 cm/s  IVC diam: 1.80 cm TAPSE (M-mode): 0.7 cm LEFT ATRIUM         Index LA diam:    3.10 cm 1.63 cm/m  AORTIC VALVE LVOT Vmax:   79.00 cm/s LVOT Vmean:  52.300 cm/s LVOT VTI:    0.131 m  AORTA Ao Root diam: 3.50 cm MITRAL VALVE               TRICUSPID VALVE MV Area (PHT): 5.13 cm    TR Peak grad:   44.6 mmHg MV Decel Time:  148 msec    TR Vmax:        334.00 cm/s MV E velocity: 61.70 cm/s MV A velocity: 78.40 cm/s  SHUNTS MV E/A ratio:  0.79        Systemic VTI:  0.13 m                            Systemic Diam: 2.30 cm Olga Millers MD Electronically signed by Olga Millers MD Signature Date/Time: 02/19/2020/4:44:27 PM    Final      Fayette Pho, MD 02/20/2020, 2:06 PM PGY-1, [facility] FPTS Intern pager: [phone], text pages welcome

## 2020-02-20 NOTE — Consult Note (Addendum)
Cardiology Consultation:   Patient ID: Paul [name]. MRN: [id number]; DOB: Nov 24, 1970  Admit date: 02/16/2020 Date of Consult: 02/20/2020  Primary Care Provider: Cleophas Dunker, DO [facility] Cardiologist: Minus Breeding, MD  [facility] Electrophysiologist:  None    Patient Profile:   Paul [name]. is a 49 y.o. male with a hx of MI and cath 2007 stent to mLAD, then DES to LCX and found to have 90% stenosis of PDA but diameter of 1.5 mm managed medically. EF 40-45%  On Echo 2011, syncope recently- saw Dr. Lenna Sciara. Percival Spanish 02/09/20 and plans for event monitor and echo, DM-2, HTN, FH of premature CAD, bipolar disorder being seen today for the evaluation of cardiomyopathy at the request of Dr. Gwendlyn Deutscher.  History of Present Illness:   [name] with acute Ant. STEMI 10/2005 and underwent acute cath with DES to mLAD. Also with severe PDA and PLV stenosis that were small and diffusely diseased.    Return to cath lab for staged procedure 2 days later with DES mLCX and PDA too small for intervention treated medically.  Echo 2011 with EF 40-45% and mild MR.  Last cath 2013 with EF 45% with mild global hypokinesis patent stents, prox RCA with 30% stenosis LCX with 40-50% stenosis. diag with 70-80% stenosis.  Pt had COVID PNA in March 2021.    Syncope in June this year was lightheaded at one point, felt better, then walking to car went out, no warning.  With this he stopped plavix.  (hx of syncope 2-3 years ago when he picked up something heavy and went out)   July 9th presented with Lt lower leg numbness and leg was cold -Critical limb ischemia --CT with nearly occlusive thrombosis at junction or Rt SFA, Nearly occlusive thrombosis at the level of the LEFT common femoral artery extending to the junction of the LEFT SFA and deep profundus artery (also found on CT Masslike lesion within the lower pole of the LEFT kidney, medial cortex, measuring 2.3 x 1.6 cm. This is  highly suspicious for neoplastic mass)   He underwent bilateral femoral artery exposure with bilateral iliofemoral thrombectomy ,  Open exposure of the left below-knee popliteal artery and subsequent retrograde thrombectomy And compartment Lt leg fasciotomy.  Later with recurrent lt leg ischemia and stent to Lt popliteal artery and Angioplasty left posterior tibial artery and tibioperoneal trunk.  He has been placed on ASA and plavix because of the stent.    MRI of abd with no mass but possible infection.    Echo ordered to rule out cardioembolic source and possible need for anticoagulation.   Ef is now 25-30%, global hypokinesis, mild LVH, G1DD.  RV systolic function is severely reduced RV size is moderately enlarged. And moderately elevated PA systolic pressure.  No MR or MS, TR is moderate The inferior vena cava is normal in size with greater than 50%  respiratory variability, suggesting right atrial pressure of 3 mmHg.   No mention of clot.       EKG:  The EKG was personally reviewed and demonstrates:  SR at 78 with old Q wave ant. Wall and mild inf ST depression.   Telemetry:  Telemetry was personally reviewed and demonstrates:  7/11 he had SVT at 150 could be 2:1 flutter but could be SVT.  Back in March 2021 he had WC rhythm no P wave and rate 70 , this was with COVID  Labs Hgb 8.8, WBC 9.6 plts 237 today Na 135, K+  3.5, BUN 12, Cr 0.94   CTA of chest without PE. Currently BP 163-143/103-92    Prior to this admit and through this admit no chest pain or SOB.  He could do all physical activity he wanted prior to admit.  Past Medical History:  Diagnosis Date  . Anxiety   . Bipolar disorder ([place])   . CAD (coronary artery disease)    Anterior MI 2007 with DES stenting of an occluded LAD, distal 70% stenosis, OM 60% stenosis, circumflex 90% stenosis treated with a drug-eluting stent, PA 90% stenosis treated medically.  Cath January 2013 70-80% diagonal stenosis in a small vessel, OM 50%  stenosis, PDA long 75% stenosis. EF 45%. He was managed medically.  . Cardiomyopathy, ischemic    EF 45%  . Diabetes mellitus   . History of chest pain 11/25/2010  . Hyperlipidemia   . Hypertension   . Myocardial infarction ([place])   . PTSD (post-traumatic stress disorder)   . Tuberculosis    1989, took pills for it     Past Surgical History:  Procedure Laterality Date  . APPLICATION OF WOUND VAC Left 02/17/2020   Procedure: APPLICATION OF WOUND VAC;  Surgeon: Serafina Mitchell, MD;  Location: [place];  Service: Vascular;  Laterality: Left;  . Catherization  08/25/11   Dr. Percival Spanish  . CORONARY ANGIOPLASTY WITH STENT PLACEMENT  207, 2011   4 stents placed  . DIRECT LARYNGOSCOPY Left 08/14/2019   Procedure: DIRECT LARYNGOSCOPY;  Surgeon: Rozetta Nunnery, MD;  Location: [place];  Service: ENT;  Laterality: Left;  . FASCIOTOMY Left 02/17/2020   Procedure: Four Compartment FASCIOTOMY;  Surgeon: Serafina Mitchell, MD;  Location: [place];  Service: Vascular;  Laterality: Left;  . FASCIOTOMY CLOSURE Left 02/17/2020   Procedure: FASCIOTOMY CLOSURE;  Surgeon: Serafina Mitchell, MD;  Location: [place];  Service: Vascular;  Laterality: Left;  . LEFT HEART CATHETERIZATION WITH CORONARY ANGIOGRAM N/A 08/25/2011   Procedure: LEFT HEART CATHETERIZATION WITH CORONARY ANGIOGRAM;  Surgeon: Minus Breeding, MD;  Location: [facility] CATH LAB;  Service: Cardiovascular;  Laterality: N/A;  . LOWER EXTREMITY ANGIOGRAM Bilateral 02/17/2020   Procedure: LOWER EXTREMITY ANGIOGRAM;  Surgeon: Serafina Mitchell, MD;  Location: [facility] OR;  Service: Vascular;  Laterality: Bilateral;  . LOWER EXTREMITY ANGIOGRAM Left 02/17/2020   Procedure: LOWER EXTREMITY ANGIOGRAM WITH Stenting Left Popliteal Artery, Angioplasty posterior Tibial Artery;  Surgeon: Serafina Mitchell, MD;  Location: [place];  Service: Vascular;  Laterality: Left;  Marland Kitchen MASS BIOPSY Left 08/14/2019   Procedure: BIOPSY EXCISION OF LEFT NECK MASS;  Surgeon: Rozetta Nunnery, MD;   Location: [place];  Service: ENT;  Laterality: Left;  . THROMBECTOMY FEMORAL ARTERY Bilateral 02/17/2020   Procedure: Bilateral  Iliofemoral Thrombectomy and exposure of left lower leg.;  Surgeon: Serafina Mitchell, MD;  Location[facility] OR;  Service: Vascular;  Laterality: Bilateral;     Home Medications:  Prior to Admission medications   Medication Sig Start Date End Date Taking? Authorizing Provider  ARIPiprazole (ABILIFY) 5 MG tablet Take 1 tablet by mouth once daily Patient taking differently: Take 5 mg by mouth daily.  01/09/20  Yes Lockamy, Timothy, DO  aspirin 81 MG EC tablet Take 1 tablet (81 mg total) by mouth daily. 11/06/19  Yes Lattie Haw, MD  atorvastatin (LIPITOR) 20 MG tablet Take 1 tablet (20 mg total) by mouth daily. 12/29/19  Yes Lockamy, Timothy, DO  carvedilol (COREG) 12.5 MG tablet Take 1 tablet (12.5 mg total) by mouth  2 (two) times daily with a meal. 11/06/19  Yes Lattie Haw, MD  citalopram (CELEXA) 20 MG tablet Take 1 tablet (20 mg total) by mouth 2 (two) times daily. 10/26/19  Yes Lockamy, Timothy, DO  empagliflozin (JARDIANCE) 25 MG TABS tablet Take 25 mg by mouth daily. 12/01/19  Yes Lockamy, Timothy, DO  insulin glargine (LANTUS) 100 UNIT/ML injection Inject 0.2 mLs (20 Units total) into the skin at bedtime. Patient taking differently: Inject 15 Units into the skin daily.  12/01/19  Yes Lockamy, Timothy, DO  isosorbide mononitrate (IMDUR) 30 MG 24 hr tablet Take 1 tablet (30 mg total) by mouth daily. 03/21/19  Yes Lockamy, Timothy, DO  metFORMIN (GLUCOPHAGE) 1000 MG tablet Take 1,000 mg by mouth 2 (two) times daily. 11/21/19  Yes [provider]  Multiple Vitamin (MULTIVITAMIN WITH MINERALS) TABS tablet Take 1 tablet by mouth daily.   Yes [provider]  nitroGLYCERIN (NITROSTAT) 0.4 MG SL tablet Place 1 tablet (0.4 mg total) under the tongue every 5 (five) minutes as needed for chest pain. 10/07/19  Yes Nuala Alpha, DO  quinapril-hydrochlorothiazide  (ACCURETIC) 20-25 MG tablet Take 1 tablet by mouth once daily 01/09/20  Yes Lockamy, Timothy, DO  traZODone (DESYREL) 50 MG tablet TAKE 1 TABLET BY MOUTH AT BEDTIME AS NEEDED FOR SLEEP Patient taking differently: Take 50 mg by mouth at bedtime as needed for sleep.  01/09/20  Yes Lockamy, Timothy, DO  triamcinolone cream (KENALOG) 0.1 % Apply 1 application topically 2 (two) times daily. 08/01/19  Yes Sherene Sires, DO  Blood Glucose Monitoring Suppl (ONE TOUCH ULTRA 2) W/DEVICE KIT Check sugar once daily. ICD10 E11.65 01/24/15   Leone Brand, MD  Continuous Glucose Monitor KIT 1 kit by Does not apply route daily. 02/20/19   Tammi Klippel, Sherin, DO  glucose blood (ONE TOUCH ULTRA TEST) test strip Check sugar once daily in the morning. ICD10 E11.65 01/10/18   Tonette Bihari, MD  ONE TOUCH LANCETS MISC Use to check sugar once daily. ICD10 E11.65 01/09/15   Willeen Niece, MD    Inpatient Medications: Scheduled Meds: . sodium chloride   Intravenous Once  . ARIPiprazole  5 mg Oral Daily  . aspirin EC  81 mg Oral Daily  . atorvastatin  40 mg Oral QHS  . carvedilol  12.5 mg Oral BID WC  . Chlorhexidine Gluconate Cloth  6 each Topical Daily  . citalopram  20 mg Oral BID  . clopidogrel  75 mg Oral Daily  . docusate sodium  100 mg Oral Daily  . empagliflozin  25 mg Oral Daily  . enoxaparin (LOVENOX) injection  40 mg Subcutaneous Q24H  . insulin aspart  0-5 Units Subcutaneous QHS  . insulin aspart  0-9 Units Subcutaneous TID WC  . isosorbide mononitrate  30 mg Oral Daily  . pantoprazole  40 mg Oral Daily   Continuous Infusions: . magnesium sulfate bolus IVPB     PRN Meds: acetaminophen **OR** acetaminophen, alum & mag hydroxide-simeth, guaiFENesin-dextromethorphan, hydrALAZINE, magnesium sulfate bolus IVPB, metoprolol tartrate, morphine injection, ondansetron, oxyCODONE-acetaminophen, phenol, polyethylene glycol, potassium chloride, traZODone  Allergies:    Allergies  Allergen Reactions  . Shrimp  [Shellfish Allergy] Swelling    tongue    Social History:   Social History   Socioeconomic History  . Marital status: Single    Spouse name: Not on file  . Number of children: Not on file  . Years of education: Not on file  . Highest education level: Not on file  Occupational History  . Not on file  Tobacco Use  . Smoking status: Never Smoker  . Smokeless tobacco: Never Used  Substance and Sexual Activity  . Alcohol use: No  . Drug use: No  . Sexual activity: Not on file  Other Topics Concern  . Not on file  Social History Narrative  . Not on file   Social Determinants of Health   Financial Resource Strain:   . Difficulty of Paying Living Expenses:   Food Insecurity:   . Worried About Charity fundraiser in the Last Year:   . Arboriculturist in the Last Year:   Transportation Needs:   . Film/video editor (Medical):   Marland Kitchen Lack of Transportation (Non-Medical):   Physical Activity:   . Days of Exercise per Week:   . Minutes of Exercise per Session:   Stress:   . Feeling of Stress :   Social Connections:   . Frequency of Communication with Friends and Family:   . Frequency of Social Gatherings with Friends and Family:   . Attends Religious Services:   . Active Member of Clubs or Organizations:   . Attends Archivist Meetings:   Marland Kitchen Marital Status:   Intimate Partner Violence:   . Fear of Current or Ex-Partner:   . Emotionally Abused:   Marland Kitchen Physically Abused:   . Sexually Abused:     Family History:    Family History  Problem Relation Age of Onset  . Hypertension Mother   . Hypertension Father   . Diabetes Maternal Aunt   . Diabetes Maternal Uncle   . Cancer Maternal Grandmother      ROS:  Please see the history of present illness.  General:no colds or fevers, no weight changes Skin:no rashes or ulcers HEENT:no blurred vision, no congestion CV:see HPI PUL:see HPI GI:no diarrhea constipation or melena, no indigestion GU:no hematuria, no  dysuria MS:no joint pain, no claudication Neuro:no syncope, no lightheadedness Endo:+ diabetes, no thyroid disease  All other ROS reviewed and negative.     Physical Exam/Data:   Vitals:   02/20/20 0205 02/20/20 0415 02/20/20 0800 02/20/20 1225  BP: 98/73 (!) 124/93 (!) 142/98 140/89  Pulse: 78 77 77 78  Resp: 15 17 17 19   Temp: 98.8 F (37.1 C) 98.7 F (37.1 C) 98.9 F (37.2 C) 98.4 F (36.9 C)  TempSrc: Oral Oral Oral Oral  SpO2: 96% 97% 100% 100%  Weight:      Height:        Intake/Output Summary (Last 24 hours) at 02/20/2020 1542 Last data filed at 02/20/2020 1228 Gross per 24 hour  Intake 305 ml  Output 600 ml  Net -295 ml   Last 3 Weights 02/18/2020 02/16/2020 02/09/2020  Weight (lbs) 157 lb 13.6 oz 165 lb 5.5 oz 160 lb  Weight (kg) 71.6 kg 75 kg 72.576 kg     Body mass index is 22.02 kg/m.  Exam per Dr. Audie Box  Relevant CV Studies: ECHO 02/19/20  IMPRESSIONS    1. Left ventricular ejection fraction, by estimation, is 25 to 30%. The  left ventricle has severely decreased function. The left ventricle  demonstrates global hypokinesis. There is mild left ventricular  hypertrophy. Left ventricular diastolic parameters  are consistent with Grade I diastolic dysfunction (impaired relaxation).  2. Right ventricular systolic function is severely reduced. The right  ventricular size is moderately enlarged. There is moderately elevated  pulmonary artery systolic pressure.  3. The mitral valve is normal  in structure. No evidence of mitral valve  regurgitation. No evidence of mitral stenosis.  4. Tricuspid valve regurgitation is moderate.  5. The aortic valve is tricuspid. Aortic valve regurgitation is not  visualized. No aortic stenosis is present.  6. The inferior vena cava is normal in size with greater than 50%  respiratory variability, suggesting right atrial pressure of 3 mmHg.   FINDINGS  Left Ventricle: Left ventricular ejection fraction, by estimation,  is 25  to 30%. The left ventricle has severely decreased function. The left  ventricle demonstrates global hypokinesis. The left ventricular internal  cavity size was normal in size. There  is mild left ventricular hypertrophy.   Right Ventricle: The right ventricular size is moderately enlarged.Right  ventricular systolic function is severely reduced. There is moderately  elevated pulmonary artery systolic pressure. The tricuspid regurgitant  velocity is 3.34 m/s, and with an  assumed right atrial pressure of 3 mmHg, the estimated right ventricular  systolic pressure is 09.9 mmHg.   Left Atrium: Left atrial size was normal in size.   Right Atrium: Right atrial size was normal in size.   Pericardium: Trivial pericardial effusion is present.   Mitral Valve: The mitral valve is normal in structure. Normal mobility of  the mitral valve leaflets. Mild mitral annular calcification. No evidence  of mitral valve stenosis.   Tricuspid Valve: The tricuspid valve is normal in structure. Tricuspid  valve regurgitation is moderate . No evidence of tricuspid stenosis.   Aortic Valve: The aortic valve is tricuspid. Aortic valve regurgitation is  not visualized. No aortic stenosis is present.   Pulmonic Valve: The pulmonic valve was normal in structure. Pulmonic valve  regurgitation is mild. No evidence of pulmonic stenosis.   Aorta: The aortic root is normal in size and structure.   Venous: The inferior vena cava is normal in size with greater than 50%  respiratory variability, suggesting right atrial pressure of 3 mmHg.    LEFT VENTRICLE  PLAX 2D  LVIDd:     4.40 cm   Diastology  LVIDs:     3.10 cm   LV e' lateral:  8.38 cm/s  LV PW:     1.20 cm   LV E/e' lateral: 7.4  LV IVS:    1.10 cm   LV e' medial:  4.90 cm/s  LVOT diam:   2.30 cm   LV E/e' medial: 12.6  LV SV:     54  LV SV Index:  29  LVOT Area:   4.15 cm    LV Volumes (MOD)   LV vol d, MOD A2C: 106.0 ml  LV vol d, MOD A4C: 105.0 ml  LV vol s, MOD A2C: 80.2 ml  LV vol s, MOD A4C: 63.5 ml  LV SV MOD A2C:   25.8 ml  LV SV MOD A4C:   105.0 ml  LV SV MOD BP:   36.3 ml   RIGHT VENTRICLE      IVC  RV S prime:   9.68 cm/s IVC diam: 1.80 cm  TAPSE (M-mode): 0.7 cm   LEFT ATRIUM     Index  LA diam:  3.10 cm 1.63 cm/m  AORTIC VALVE  LVOT Vmax:  79.00 cm/s  LVOT Vmean: 52.300 cm/s  LVOT VTI:  0.131 m    AORTA  Ao Root diam: 3.50 cm   MITRAL VALVE        TRICUSPID VALVE  MV Area (PHT): 5.13 cm  TR Peak grad:  44.6 mmHg  MV Decel Time: 148 msec  TR Vmax:    334.00 cm/s  MV E velocity: 61.70 cm/s  MV A velocity: 78.40 cm/s SHUNTS  MV E/A ratio: 0.79    Systemic VTI: 0.13 m               Systemic Diam: 2.30 cm   Laboratory Data:  High Sensitivity Troponin:  No results for input(s): TROPONINIHS in the last 720 hours.   Chemistry Recent Labs  Lab 02/16/20 2044 02/16/20 2044 02/17/20 2047 02/18/20 0600 02/19/20 0245  NA 136   < > 142 136 135  K 3.1*   < > 3.3* 3.9 3.5  CL 99   < > 102 102 104  CO2 27  --   --  22 25  GLUCOSE 126*   < > 107* 150* 152*  BUN 12   < > 13 14 12   CREATININE 1.19   < > 0.70 1.00 0.95  CALCIUM 8.8*  --   --  8.2* 8.2*  GFRNONAA >60  --   --  >60 >60  GFRAA >60  --   --  >60 >60  ANIONGAP 10  --   --  12 6   < > = values in this interval not displayed.    Recent Labs  Lab 02/16/20 2044 02/18/20 0600  PROT 8.3* 7.3  ALBUMIN 3.1* 2.8*  AST 49* 61*  ALT 21 18  ALKPHOS 48 37*  BILITOT 0.9 0.9   Hematology Recent Labs  Lab 02/16/20 2044 02/16/20 2044 02/17/20 2047 02/19/20 0245 02/20/20 0550  WBC 6.9  --   --  9.8 9.6  RBC 5.47  --   --  3.32* 3.61*  HGB 12.3*   < > 9.9* 7.7* 8.8*  HCT 41.7   < > 29.0* 25.4* 28.7*  MCV 76.2*  --   --  76.5* 79.5*  MCH 22.5*  --   --  23.2* 24.4*  MCHC 29.5*  --   --  30.3 30.7  RDW 20.0*  --   --  19.3*  20.0*  PLT 341  --   --  232 237   < > = values in this interval not displayed.   BNPNo results for input(s): BNP, PROBNP in the last 168 hours.  DDimer No results for input(s): DDIMER in the last 168 hours.   Radiology/Studies:  MR ABDOMEN W WO CONTRAST  Result Date: 02/19/2020 CLINICAL DATA:  Left renal lesions seen on recent CT scan. EXAM: MRI ABDOMEN WITHOUT AND WITH CONTRAST TECHNIQUE: Multiplanar multisequence MR imaging of the abdomen was performed both before and after the administration of intravenous contrast. CONTRAST:  67m GADAVIST GADOBUTROL 1 MMOL/ML IV SOLN COMPARISON:  None. FINDINGS: Lower chest: The lung bases are grossly clear. The heart is borderline enlarged. No pleural or pericardial effusion. Hepatobiliary: No hepatic lesions or intrahepatic biliary dilatation. The gallbladder is unremarkable. The portal and hepatic veins are patent. Normal caliber and course of the common bile duct. Pancreas:  No mass, inflammation or ductal dilatation. Spleen:  Normal size. No focal lesions. Adrenals/Urinary Tract: Adrenal glands and right kidney are unremarkable. Small linear band of scarring type change noted in the midpole region of the right kidney. The left kidney demonstrates patchy decreased perfusion involving the medial cortex at the midpole lower pole junction region. Another smaller area of decreased perfusion is noted in the upper pole posteriorly. This is less evident on the more delayed post-contrast images and is most likely an area  of pyelonephritis. I do not see a discrete mass or evidence of an infiltrating neoplasm. No hydronephrosis. Stomach/Bowel: The stomach and duodenum are unremarkable. There are moderately thickened small bowel loops in the left upper quadrant along with some fluid-filled slightly distended small bowel loops. The visualized colon is grossly normal. Vascular/Lymphatic: The aorta and branch vessels are patent. No mesenteric or retroperitoneal mass or  adenopathy. Other:  No ascites or abdominal wall hernia. Musculoskeletal: No significant bony findings. IMPRESSION: 1. Patchy areas of decreased perfusion involving the left kidney as detailed above. This is most likely pyelonephritis. No discrete mass or evidence of an infiltrating neoplasm. Recommend correlation with urinalysis. A follow-up CT or MRI after appropriate treatment may be helpful to make sure this completely resolves. 2. Thickened and dilated small bowel loops in the left upper quadrant suggesting an inflammatory or infectious enteritis. Electronically Signed   By: Marijo Sanes M.D.   On: 02/19/2020 05:46   CT Angio Aortobifemoral W and/or Wo Contrast  Result Date: 02/17/2020 CLINICAL DATA:  LEFT lower leg numbness onset yesterday.  No injury. EXAM: CT ANGIOGRAPHY CHEST, ABDOMEN AND PELVIS CT ANGIOGRAPHY AORTOBIFEM WITH RUNOFF TECHNIQUE: Multidetector CT imaging through the chest, abdomen and pelvis was performed using the standard protocol during bolus administration of intravenous contrast. Multiplanar reconstructed images and MIPs were obtained and reviewed to evaluate the vascular anatomy. Additional axial images obtained through the lower extremities to the feet using standard protocol during bolus administration of intravascular contrast. Multiplanar reconstructed images and MIPS provided. CONTRAST:  169m OMNIPAQUE IOHEXOL 350 MG/ML SOLN COMPARISON:  Chest CT dated 11/04/2019. FINDINGS: CTA CHEST FINDINGS Cardiovascular: No thoracic aortic aneurysm or evidence of aortic dissection. Borderline cardiomegaly. No pericardial effusion. No pulmonary embolism is seen within the main, lobar or segmental pulmonary arteries bilaterally. Coronary artery calcifications. Mediastinum/Nodes: There are scattered/numerous mildly prominent lymph nodes within the mediastinum, including a 1.1 cm short axis lymph node in the upper aortopulmonary window region space (series 5, image 60) and conglomerate small  lymph nodes within the anterior mediastinum (series 5, image 73). Esophagus is unremarkable. Trachea central bronchi are unremarkable. Lungs/Pleura: Improved aeration of the lower lobes compatible with resolved pneumonias. There is chronic appearing bibasilar fibrosis. 3 mm perifissural nodule the LEFT lower lobe (series 6, image 104), likely small lymph node. No new lung findings. No evidence active pneumonia. No pleural effusion or pneumothorax. Musculoskeletal: No acute or suspicious osseous finding. Suspected bilateral gynecomastia. Review of the MIP images confirms the above findings. CTA ABDOMEN AND PELVIS FINDINGS VASCULAR Aorta: Normal caliber aorta without aneurysm, dissection, vasculitis or significant stenosis. Celiac: Patent without evidence of aneurysm, dissection, vasculitis or significant stenosis. SMA: Patent without evidence of aneurysm, dissection, vasculitis or significant stenosis. Renals: Both renal arteries are patent without evidence of aneurysm, dissection, vasculitis, fibromuscular dysplasia or significant stenosis. IMA: Patent without evidence of aneurysm, dissection, vasculitis or significant stenosis. Inflow: Atherosclerosis at the aortic bifurcation. Normal contrast flow is seen within the bilateral common iliac external iliac arteries. Nearly occlusive thrombosis at the level of the LEFT common femoral artery. Normal contrast flow seen to the LEFT SFA. Veins: No obvious venous abnormality within the limitations of this arterial phase study. Review of the MIP images confirms the above findings. NON-VASCULAR Hepatobiliary: No focal liver abnormality is seen. No gallstones, gallbladder wall thickening, or biliary dilatation. Pancreas: Unremarkable. No pancreatic ductal dilatation or surrounding inflammatory changes. Spleen: Normal in size without focal abnormality. Adrenals/Urinary Tract: Adrenal glands are unremarkable. Hypodense masslike lesion within the lower  pole of the LEFT kidney,  medial cortex, measuring approximately 2.3 x 1.6 cm, mass versus confluent edema. RIGHT kidney appears normal without mass, stone hydronephrosis. No LEFT-sided hydronephrosis. No ureteral or bladder calculi are identified. Bladder is unremarkable. Stomach/Bowel: No dilated large or small bowel loops. No evidence of bowel wall inflammation. Appendix is normal. Is unremarkable, partially decompressed. Lymphatic: No enlarged lymph nodes appreciated within the abdomen or pelvis. Reproductive: Prostate gland is mildly prominent causing slight mass effect on the bladder base. Other: No free fluid or abscess collection is seen. No free intraperitoneal air. Musculoskeletal: No acute or suspicious osseous finding. CT ANGIOGRAM RUNOFF FINDINGS Right Lower Extremity: Focal nearly occlusive thrombosis at the junction of the RIGHT SFA and deep profundus artery, but flow is present throughout the RIGHT SFA. Fairly extensive atherosclerosis of the RIGHT SFA. Normal contrast flow is seen at the level of the upper RIGHT popliteal artery. There is no contrast flow seen within the lower RIGHT popliteal artery or RIGHT calf arteries. Heavy atherosclerotic changes throughout the RIGHT calf arteries. Left Lower Extremity: As above, nearly occlusive thrombosis at the level of the LEFT common femoral artery extending to the junction of the LEFT SFA and deep profundus artery. Normal contrast flow is present throughout the LEFT SFA. Contrast flow is seen to the level of the upper LEFT popliteal artery. No contrast flow is seen within the lower LEFT popliteal artery or within the LEFT calf arteries. Heavy atherosclerotic changes are seen throughout the LEFT calf arteries. Soft tissues of the lower extremities are unremarkable. Osseous structures of the lower extremities are unremarkable. Review of the MIP images confirms the above findings. IMPRESSION: 1. Focal nearly occlusive thrombosis at the junction of the RIGHT SFA and deep profundus  artery, but normal contrast flow is present throughout the RIGHT SFA. 2. Nearly occlusive thrombosis at the level of the LEFT common femoral artery extending to the junction of the LEFT SFA and deep profundus artery. Normal contrast flow is present throughout the LEFT SFA. 3. No contrast flow is seen within the calf arteries bilaterally (no flow is seen distal to either popliteal artery). Cannot exclude complete occlusions at the level of both popliteal arteries, but as this is symmetric/bilateral, this may be due to contrast bolus timing (delayed/slow blood flow). 4. Heavy calcified atherosclerotic changes throughout the bilateral lower extremities. 5. **An incidental finding of potential clinical significance has been found. Masslike lesion within the lower pole of the LEFT kidney, medial cortex, measuring 2.3 x 1.6 cm. This is highly suspicious for neoplastic mass. Alternatively, but less likely, this could represent edema related to pyelonephritis or infarct. Recommend renal MRI for further characterization.** 6. Scattered/numerous small lymph nodes within the mediastinum, pathologic by number. These could be reactive or neoplastic in etiology. 7. Resolved bibasilar pneumonia compared to previous chest CT of 11/04/2019. 8. Chronic pulmonary fibrosis at the bilateral lung bases, moderate in degree. Critical Value/emergent results, including LEFT renal findings, were called by telephone at the time of interpretation on 02/17/2020 at 4:50 am to provider [facility] , who verbally acknowledged these results. Electronically Signed   By: Franki Cabot M.D.   On: 02/17/2020 04:54   DG Ang/Ext/Uni/Or Right  Result Date: 02/17/2020 CLINICAL DATA:  Angiography. EXAM: RIGHT ANG/EXT/UNI/ OR CONTRAST:  See operative report. FLUOROSCOPY TIME:  Fluoroscopy Time:  2 minutes and 56 seconds Number of Acquired Spot Images: 3 images were submitted. COMPARISON:  CT from the same day. FINDINGS: On the right, there appears to  be an acute complete occlusion of the above knee popliteal artery with reconstitution below the knee via collaterals. Atherosclerotic disease is noted in the visualized portions of the right SFA. IMPRESSION: Angiography as above. Please see separate operative report for complete details. Electronically Signed   By: Constance Holster M.D.   On: 02/17/2020 15:47   DG Ang/Ext/Uni/Or Left  Result Date: 02/17/2020 CLINICAL DATA:  Angiography EXAM: LEFT ANG/EXT/UNI/ OR CONTRAST:  See separate operative report FLUOROSCOPY TIME:  Fluoroscopy Time:  2 minutes and 56 seconds Number of Acquired Spot Images: 5 images were submitted COMPARISON:  CT from the same day FINDINGS: A catheter is noted overlying the left SFA. A nonocclusive thrombus is noted within the proximal left profundus femoris artery. Atherosclerotic changes are noted of the left SFA. There is complete occlusion of the above knee popliteal artery with reconstitution distally via collaterals. There appears to be a single vessel runoff to the left ankle. IMPRESSION: Status post angiography as detailed above. Please see separate operative report for complete details. Electronically Signed   By: Constance Holster M.D.   On: 02/17/2020 15:49   DG C-Arm 1-60 Min  Result Date: 02/17/2020 CLINICAL DATA:  Angiography EXAM: DG C-ARM 1-60 MIN CONTRAST:  See operative report. FLUOROSCOPY TIME:  Fluoroscopy Time:  2 minutes and 56 seconds Number of Acquired Spot Images: 5 COMPARISON:  CT angiogram same day FINDINGS: A catheter projects over the left SFA and common femoral artery. There is a nonocclusive thrombus within the left proximal profundus femoris artery. There are atherosclerotic changes throughout the left SFA. There is complete occlusion of the above knee popliteal artery with reconstitution below the knee. There appears to be a single vessel runoff to the level of the ankle. IMPRESSION: Angiogram as detailed above. See separate operative report for  complete details. Electronically Signed   By: Constance Holster M.D.   On: 02/17/2020 16:36   CT ANGIO CHEST AORTA W/CM & OR WO/CM  Result Date: 02/17/2020 CLINICAL DATA:  LEFT lower leg numbness onset yesterday.  No injury. EXAM: CT ANGIOGRAPHY CHEST, ABDOMEN AND PELVIS CT ANGIOGRAPHY AORTOBIFEM WITH RUNOFF TECHNIQUE: Multidetector CT imaging through the chest, abdomen and pelvis was performed using the standard protocol during bolus administration of intravenous contrast. Multiplanar reconstructed images and MIPs were obtained and reviewed to evaluate the vascular anatomy. Additional axial images obtained through the lower extremities to the feet using standard protocol during bolus administration of intravascular contrast. Multiplanar reconstructed images and MIPS provided. CONTRAST:  139m OMNIPAQUE IOHEXOL 350 MG/ML SOLN COMPARISON:  Chest CT dated 11/04/2019. FINDINGS: CTA CHEST FINDINGS Cardiovascular: No thoracic aortic aneurysm or evidence of aortic dissection. Borderline cardiomegaly. No pericardial effusion. No pulmonary embolism is seen within the main, lobar or segmental pulmonary arteries bilaterally. Coronary artery calcifications. Mediastinum/Nodes: There are scattered/numerous mildly prominent lymph nodes within the mediastinum, including a 1.1 cm short axis lymph node in the upper aortopulmonary window region space (series 5, image 60) and conglomerate small lymph nodes within the anterior mediastinum (series 5, image 73). Esophagus is unremarkable. Trachea central bronchi are unremarkable. Lungs/Pleura: Improved aeration of the lower lobes compatible with resolved pneumonias. There is chronic appearing bibasilar fibrosis. 3 mm perifissural nodule the LEFT lower lobe (series 6, image 104), likely small lymph node. No new lung findings. No evidence active pneumonia. No pleural effusion or pneumothorax. Musculoskeletal: No acute or suspicious osseous finding. Suspected bilateral gynecomastia.  Review of the MIP images confirms the above findings. CTA ABDOMEN AND PELVIS FINDINGS VASCULAR Aorta:  Normal caliber aorta without aneurysm, dissection, vasculitis or significant stenosis. Celiac: Patent without evidence of aneurysm, dissection, vasculitis or significant stenosis. SMA: Patent without evidence of aneurysm, dissection, vasculitis or significant stenosis. Renals: Both renal arteries are patent without evidence of aneurysm, dissection, vasculitis, fibromuscular dysplasia or significant stenosis. IMA: Patent without evidence of aneurysm, dissection, vasculitis or significant stenosis. Inflow: Atherosclerosis at the aortic bifurcation. Normal contrast flow is seen within the bilateral common iliac external iliac arteries. Nearly occlusive thrombosis at the level of the LEFT common femoral artery. Normal contrast flow seen to the LEFT SFA. Veins: No obvious venous abnormality within the limitations of this arterial phase study. Review of the MIP images confirms the above findings. NON-VASCULAR Hepatobiliary: No focal liver abnormality is seen. No gallstones, gallbladder wall thickening, or biliary dilatation. Pancreas: Unremarkable. No pancreatic ductal dilatation or surrounding inflammatory changes. Spleen: Normal in size without focal abnormality. Adrenals/Urinary Tract: Adrenal glands are unremarkable. Hypodense masslike lesion within the lower pole of the LEFT kidney, medial cortex, measuring approximately 2.3 x 1.6 cm, mass versus confluent edema. RIGHT kidney appears normal without mass, stone hydronephrosis. No LEFT-sided hydronephrosis. No ureteral or bladder calculi are identified. Bladder is unremarkable. Stomach/Bowel: No dilated large or small bowel loops. No evidence of bowel wall inflammation. Appendix is normal. Is unremarkable, partially decompressed. Lymphatic: No enlarged lymph nodes appreciated within the abdomen or pelvis. Reproductive: Prostate gland is mildly prominent causing slight  mass effect on the bladder base. Other: No free fluid or abscess collection is seen. No free intraperitoneal air. Musculoskeletal: No acute or suspicious osseous finding. CT ANGIOGRAM RUNOFF FINDINGS Right Lower Extremity: Focal nearly occlusive thrombosis at the junction of the RIGHT SFA and deep profundus artery, but flow is present throughout the RIGHT SFA. Fairly extensive atherosclerosis of the RIGHT SFA. Normal contrast flow is seen at the level of the upper RIGHT popliteal artery. There is no contrast flow seen within the lower RIGHT popliteal artery or RIGHT calf arteries. Heavy atherosclerotic changes throughout the RIGHT calf arteries. Left Lower Extremity: As above, nearly occlusive thrombosis at the level of the LEFT common femoral artery extending to the junction of the LEFT SFA and deep profundus artery. Normal contrast flow is present throughout the LEFT SFA. Contrast flow is seen to the level of the upper LEFT popliteal artery. No contrast flow is seen within the lower LEFT popliteal artery or within the LEFT calf arteries. Heavy atherosclerotic changes are seen throughout the LEFT calf arteries. Soft tissues of the lower extremities are unremarkable. Osseous structures of the lower extremities are unremarkable. Review of the MIP images confirms the above findings. IMPRESSION: 1. Focal nearly occlusive thrombosis at the junction of the RIGHT SFA and deep profundus artery, but normal contrast flow is present throughout the RIGHT SFA. 2. Nearly occlusive thrombosis at the level of the LEFT common femoral artery extending to the junction of the LEFT SFA and deep profundus artery. Normal contrast flow is present throughout the LEFT SFA. 3. No contrast flow is seen within the calf arteries bilaterally (no flow is seen distal to either popliteal artery). Cannot exclude complete occlusions at the level of both popliteal arteries, but as this is symmetric/bilateral, this may be due to contrast bolus timing  (delayed/slow blood flow). 4. Heavy calcified atherosclerotic changes throughout the bilateral lower extremities. 5. **An incidental finding of potential clinical significance has been found. Masslike lesion within the lower pole of the LEFT kidney, medial cortex, measuring 2.3 x 1.6 cm. This is highly suspicious  for neoplastic mass. Alternatively, but less likely, this could represent edema related to pyelonephritis or infarct. Recommend renal MRI for further characterization.** 6. Scattered/numerous small lymph nodes within the mediastinum, pathologic by number. These could be reactive or neoplastic in etiology. 7. Resolved bibasilar pneumonia compared to previous chest CT of 11/04/2019. 8. Chronic pulmonary fibrosis at the bilateral lung bases, moderate in degree. Critical Value/emergent results, including LEFT renal findings, were called by telephone at the time of interpretation on 02/17/2020 at 4:50 am to provider [facility] , who verbally acknowledged these results. Electronically Signed   By: Franki Cabot M.D.   On: 02/17/2020 04:54   ECHOCARDIOGRAM LIMITED  Result Date: 02/19/2020    ECHOCARDIOGRAM LIMITED REPORT   Patient Name:   Paul [name]. Date of Exam: 02/19/2020 Medical Rec #:  [id number]             Height:       71.0 in Accession #:    [id number]            Weight:       157.8 lb Date of Birth:  February 14, 1971            BSA:          1.907 m Patient Age:    63 years              BP:           121/82 mmHg Patient Gender: M                     HR:           78 bpm. Exam Location:  Inpatient Procedure: Limited Color Doppler, Color Doppler and Limited Echo Indications:    Embolism and thrombosis of arteries of lower extremity  History:        Patient has prior history of Echocardiogram examinations, most                 recent 02/04/2018. Cardiomyopathy, CAD, Covid,                 Signs/Symptoms:Dyspnea; Risk Factors:Hypertension, Diabetes and                 Dyslipidemia.  Sonographer:     Johny Chess Referring Phys: [place]  1. Left ventricular ejection fraction, by estimation, is 25 to 30%. The left ventricle has severely decreased function. The left ventricle demonstrates global hypokinesis. There is mild left ventricular hypertrophy. Left ventricular diastolic parameters  are consistent with Grade I diastolic dysfunction (impaired relaxation).  2. Right ventricular systolic function is severely reduced. The right ventricular size is moderately enlarged. There is moderately elevated pulmonary artery systolic pressure.  3. The mitral valve is normal in structure. No evidence of mitral valve regurgitation. No evidence of mitral stenosis.  4. Tricuspid valve regurgitation is moderate.  5. The aortic valve is tricuspid. Aortic valve regurgitation is not visualized. No aortic stenosis is present.  6. The inferior vena cava is normal in size with greater than 50% respiratory variability, suggesting right atrial pressure of 3 mmHg. FINDINGS  Left Ventricle: Left ventricular ejection fraction, by estimation, is 25 to 30%. The left ventricle has severely decreased function. The left ventricle demonstrates global hypokinesis. The left ventricular internal cavity size was normal in size. There is mild left ventricular hypertrophy. Right Ventricle: The right ventricular size is moderately enlarged.Right ventricular systolic function is severely reduced. There is moderately elevated  pulmonary artery systolic pressure. The tricuspid regurgitant velocity is 3.34 m/s, and with an assumed right atrial pressure of 3 mmHg, the estimated right ventricular systolic pressure is 81.1 mmHg. Left Atrium: Left atrial size was normal in size. Right Atrium: Right atrial size was normal in size. Pericardium: Trivial pericardial effusion is present. Mitral Valve: The mitral valve is normal in structure. Normal mobility of the mitral valve leaflets. Mild mitral annular calcification. No evidence of  mitral valve stenosis. Tricuspid Valve: The tricuspid valve is normal in structure. Tricuspid valve regurgitation is moderate . No evidence of tricuspid stenosis. Aortic Valve: The aortic valve is tricuspid. Aortic valve regurgitation is not visualized. No aortic stenosis is present. Pulmonic Valve: The pulmonic valve was normal in structure. Pulmonic valve regurgitation is mild. No evidence of pulmonic stenosis. Aorta: The aortic root is normal in size and structure. Venous: The inferior vena cava is normal in size with greater than 50% respiratory variability, suggesting right atrial pressure of 3 mmHg.  LEFT VENTRICLE PLAX 2D LVIDd:         4.40 cm      Diastology LVIDs:         3.10 cm      LV e' lateral:   8.38 cm/s LV PW:         1.20 cm      LV E/e' lateral: 7.4 LV IVS:        1.10 cm      LV e' medial:    4.90 cm/s LVOT diam:     2.30 cm      LV E/e' medial:  12.6 LV SV:         54 LV SV Index:   29 LVOT Area:     4.15 cm  LV Volumes (MOD) LV vol d, MOD A2C: 106.0 ml LV vol d, MOD A4C: 105.0 ml LV vol s, MOD A2C: 80.2 ml LV vol s, MOD A4C: 63.5 ml LV SV MOD A2C:     25.8 ml LV SV MOD A4C:     105.0 ml LV SV MOD BP:      36.3 ml RIGHT VENTRICLE            IVC RV S prime:     9.68 cm/s  IVC diam: 1.80 cm TAPSE (M-mode): 0.7 cm LEFT ATRIUM         Index LA diam:    3.10 cm 1.63 cm/m  AORTIC VALVE LVOT Vmax:   79.00 cm/s LVOT Vmean:  52.300 cm/s LVOT VTI:    0.131 m  AORTA Ao Root diam: 3.50 cm MITRAL VALVE               TRICUSPID VALVE MV Area (PHT): 5.13 cm    TR Peak grad:   44.6 mmHg MV Decel Time: 148 msec    TR Vmax:        334.00 cm/s MV E velocity: 61.70 cm/s MV A velocity: 78.40 cm/s  SHUNTS MV E/A ratio:  0.79        Systemic VTI:  0.13 m                            Systemic Diam: 2.30 cm Kirk Ruths MD Electronically signed by Kirk Ruths MD Signature Date/Time: 02/19/2020/4:44:27 PM    Final    HYBRID OR IMAGING ([place])  Result Date: 02/17/2020 There is no interpretation for this exam.   This order is for  images obtained during a surgical procedure.  Please See "Surgeries" Tab for more information regarding the procedure.        NO CHEST PAIN      Assessment and Plan:   Cardiomyopathy with EF 20-25%, and hx of ICM with EF 45%. Pt did have COVID in March of this year as well.  He also had syncope in June. No chest pain.  May need Lt and RHC to evaluate with hx known CAD and stents in 2007 to mLAD and LCX fo rischemia.   With acute thrombus to legs possible clot of LV, so TEE would be beneficial as well.   --on coreg 12.5 BID and imdur,  Cr is stable will add entresto and stop ACE, none since admit but will have pharmacy clarify.  Have care manager eval if he can afford.   if pt could afford, though would wait to see if cath is needed. And spironolactone.  --no troponins  --farxiga as well - he is on jardiance.   Bilateral critical limb ischemia with thrombectomy POD #3.   Stent placed to popliteal as well and ASA and plavix.  PAD,  ? If cardiac emboli. TEE may be helpful.  --difficult to move legs now.  PT is working with him.   Anticoagulation Per Dr. Stephens Shire last note anticoagulation is recommended. Hold until we do further testing.  SVT regular rhythm on the 11th could be 2:1 flutter MD to review. On 12.5 BID of coreg. Would continue   CAD with last cath 2013 in diabetic pt and premature FH of CAD, now with decrease of EF, cath to rule out ischemia. Defer to MD.  Syncope in June unsure when drop in EF occurred possible this admit, possible with COVID PNA was to wear 30 day event.    HLD on statin continue.  Diabetes per IM  Renal mass per MRI is more infection per IM   Anemia acute blood loss due to surgery has rec'd 1 unit PRBCs   COVID PNA in 10/2019  And now + for COVID may be just not cleared from system,  Please have ID to eval for need for isolation.              For questions or updates, please contact [place] Please consult [email or web address]  for contact info under    Signed, Cecilie Kicks, NP  02/20/2020 3:42 PM

## 2020-02-20 NOTE — TOC Progression Note (Signed)
Transition of Care (TOC) - Progression Note  Sander Radon, BSN Transitions of Care Unit 4E- RN Case Manager See Treatment Team for direct phone #    Patient Details  Name: Paul [name]. MRN: [id number] Date of Birth: 11-19-1970  Transition of Care [facility]) CM/SW Contact  Zenda Alpers, Lenn Sink, RN Phone Number: 02/20/2020, 4:25 PM  Clinical Narrative:    Follow up done for [facility] needs- call made to [facility] with [facility] for HHPT referral- [facility] has accepted referral and will follow pt for start of care needs at time of discharge.  TOC will notify [employer] for DME needs closer to discharge to have DME delivered to room prior to discharge.    Expected Discharge Plan: Home w Home Health Services Barriers to Discharge: Continued Medical Work up  Expected Discharge Plan and Services Expected Discharge Plan: Home w Home Health Services   Discharge Planning Services: CM Consult Post Acute Care Choice: Home Health, Durable Medical Equipment Living arrangements for the past 2 months: Apartment                 DME Arranged: 3-N-1, [place] rolling DME Agency: [employer]       HH Arranged: PT HH Agency: [facility] Date [facility] Agency Contacted: 02/20/20 Time HH Agency Contacted: [phone] Representative spoke with at [facility] Agency: Kandee Keen   Social Determinants of Health (SDOH) Interventions    Readmission Risk Interventions No flowsheet data found.

## 2020-02-20 NOTE — Progress Notes (Signed)
Physical Therapy Treatment Patient Details Name: Paul [name]. MRN: [id number] DOB: 12-19-1970 Today's Date: 02/20/2020    History of Present Illness 49 y.o. male presenting with critical limb ischemia s/p bilateral thrombectomy and L fasciotomy on 02/27/2020. PMHx significant for TB, PTSD, MI, HTN, and DM.      PT Comments    Pt fatigued, but agreeable to OOB mobility. Pt with improved ambulation distance in room this session, PT focusing on L foot clearance. Pt also tolerated EOB exercises, but limited due to fatigue. PT to continue to follow and progress mobility as able.    Follow Up Recommendations  Home health PT;Supervision for mobility/OOB     Equipment Recommendations  Rolling walker with 5" wheels    Recommendations for Other Services       Precautions / Restrictions Precautions Precautions: Fall Restrictions Weight Bearing Restrictions: No    Mobility  Bed Mobility Overal bed mobility: Needs Assistance Bed Mobility: Supine to Sit;Sit to Supine     Supine to sit: Supervision Sit to supine: Supervision   General bed mobility comments: for safety, increased time and effort especially when lifting LEs into bed.  Transfers Overall transfer level: Needs assistance Equipment used: Rolling walker (2 wheeled) Transfers: Sit to/from Stand Sit to Stand: Min guard;From elevated surface         General transfer comment: for safety, pt requesting bed height elevation for ease of standing  Ambulation/Gait Ambulation/Gait assistance: Min guard Gait Distance (Feet): 50 Feet Assistive device: Rolling walker (2 wheeled) Gait Pattern/deviations: Step-through pattern;Decreased stride length;Trunk flexed;Decreased dorsiflexion - left Gait velocity: decr   General Gait Details: min guard for safety, verbal cuing for placement in RW, upright posture, and increasing hip and knee flexion on L to compensate for L drop foot   Stairs             Wheelchair  Mobility    Modified Rankin (Stroke Patients Only)       Balance Overall balance assessment: Needs assistance Sitting-balance support: Feet supported Sitting balance-Leahy Scale: Good     Standing balance support: Bilateral upper extremity supported Standing balance-Leahy Scale: Poor Standing balance comment: Reliant on UE support                            Cognition Arousal/Alertness: Awake/alert Behavior During Therapy: WFL for tasks assessed/performed Overall Cognitive Status: Within Functional Limits for tasks assessed                                        Exercises General Exercises - Lower Extremity Long Arc Quad: AROM;Both;10 reps;Seated Hip Flexion/Marching: AROM;Both;10 reps;Seated    General Comments General comments (skin integrity, edema, etc.): HRmax 99 bpm during gait      Pertinent Vitals/Pain Pain Assessment: No/denies pain Pain Location: LLE Pain Intervention(s): Limited activity within patient's tolerance;Monitored during session    Home Living                      Prior Function            PT Goals (current goals can now be found in the care plan section) Acute Rehab PT Goals Patient Stated Goal: None stated PT Goal Formulation: With patient Time For Goal Achievement: 02/25/20 Potential to Achieve Goals: Good Progress towards PT goals: Progressing toward goals    Frequency  Min 3X/week      PT Plan Current plan remains appropriate    Co-evaluation              AM-PAC PT "6 Clicks" Mobility   Outcome Measure  Help needed turning from your back to your side while in a flat bed without using bedrails?: None Help needed moving from lying on your back to sitting on the side of a flat bed without using bedrails?: A Little Help needed moving to and from a bed to a chair (including a wheelchair)?: A Little Help needed standing up from a chair using your arms (e.g., wheelchair or bedside  chair)?: A Little Help needed to walk in hospital room?: A Little Help needed climbing 3-5 steps with a railing? : A Little 6 Click Score: 19    End of Session Equipment Utilized During Treatment: Gait belt Activity Tolerance: Patient tolerated treatment well Patient left: with call bell/phone within reach;in bed;with SCD's reapplied (SCD to RLE) Nurse Communication: Mobility status PT Visit Diagnosis: Difficulty in walking, not elsewhere classified (R26.2);Pain Pain - Right/Left:  (bilateral) Pain - part of body: Leg     Time: 1521-1550 PT Time Calculation (min) (ACUTE ONLY): 29 min  Charges:  $Gait Training: 8-22 mins $Therapeutic Exercise: 8-22 mins                     Alexa E, PT Acute Rehabilitation Services Pager [phone]  Office [phone]  Alexa D Despina Hidden 02/20/2020, 4:19 PM

## 2020-02-21 ENCOUNTER — Inpatient Hospital Stay (HOSPITAL_COMMUNITY): Payer: Medicare Other

## 2020-02-21 DIAGNOSIS — I5021 Acute systolic (congestive) heart failure: Secondary | ICD-10-CM

## 2020-02-21 DIAGNOSIS — I5020 Unspecified systolic (congestive) heart failure: Secondary | ICD-10-CM

## 2020-02-21 LAB — GLUCOSE, CAPILLARY
Glucose-Capillary: 125 mg/dL — ABNORMAL HIGH (ref 70–99)
Glucose-Capillary: 117 mg/dL — ABNORMAL HIGH (ref 70–99)
Glucose-Capillary: 169 mg/dL — ABNORMAL HIGH (ref 70–99)
Glucose-Capillary: 107 mg/dL — ABNORMAL HIGH (ref 70–99)

## 2020-02-21 LAB — ECHOCARDIOGRAM LIMITED
Height: 71 in
Weight: 2525.59 oz

## 2020-02-21 LAB — COMPREHENSIVE METABOLIC PANEL
ALT: 16 U/L (ref 0–44)
AST: 37 U/L (ref 15–41)
Albumin: 2.3 g/dL — ABNORMAL LOW (ref 3.5–5.0)
Alkaline Phosphatase: 46 U/L (ref 38–126)
Anion gap: 11 (ref 5–15)
BUN: 11 mg/dL (ref 6–20)
CO2: 23 mmol/L (ref 22–32)
Calcium: 8.2 mg/dL — ABNORMAL LOW (ref 8.9–10.3)
Chloride: 105 mmol/L (ref 98–111)
Creatinine, Ser: 0.96 mg/dL (ref 0.61–1.24)
GFR calc Af Amer: >60 mL/min (ref >60)
GFR calc non Af Amer: >60 mL/min (ref >60)
Glucose, Bld: 115 mg/dL — ABNORMAL HIGH (ref 70–99)
Potassium: 3.6 mmol/L (ref 3.5–5.1)
Sodium: 139 mmol/L (ref 135–145)
Total Bilirubin: 0.6 mg/dL (ref 0.3–1.2)
Total Protein: 6.9 g/dL (ref 6.5–8.1)

## 2020-02-21 LAB — BPAM RBC
Blood Product Expiration Date: 202108142359
ISSUE DATE / TIME: 202107130129
Unit Type and Rh: 7300

## 2020-02-21 LAB — CBC
HCT: 28.9 % — ABNORMAL LOW (ref 39.0–52.0)
HCT: 30.8 % — ABNORMAL LOW (ref 39.0–52.0)
Hemoglobin: 8.8 g/dL — ABNORMAL LOW (ref 13.0–17.0)
Hemoglobin: 9.3 g/dL — ABNORMAL LOW (ref 13.0–17.0)
MCH: 24.1 pg — ABNORMAL LOW (ref 26.0–34.0)
MCH: 23.5 pg — ABNORMAL LOW (ref 26.0–34.0)
MCHC: 30.4 g/dL (ref 30.0–36.0)
MCHC: 30.2 g/dL (ref 30.0–36.0)
MCV: 79.2 fL — ABNORMAL LOW (ref 80.0–100.0)
MCV: 78 fL — ABNORMAL LOW (ref 80.0–100.0)
Platelets: 230 10*3/uL (ref 150–400)
Platelets: 245 10*3/uL (ref 150–400)
RBC: 3.65 MIL/uL — ABNORMAL LOW (ref 4.22–5.81)
RBC: 3.95 MIL/uL — ABNORMAL LOW (ref 4.22–5.81)
RDW: 19.9 % — ABNORMAL HIGH (ref 11.5–15.5)
RDW: 19.9 % — ABNORMAL HIGH (ref 11.5–15.5)
WBC: 8.2 10*3/uL (ref 4.0–10.5)
WBC: 8.3 10*3/uL (ref 4.0–10.5)
nRBC: 0.4 % — ABNORMAL HIGH (ref 0.0–0.2)
nRBC: 0.4 % — ABNORMAL HIGH (ref 0.0–0.2)

## 2020-02-21 LAB — TYPE AND SCREEN
ABO/RH(D): B POS
Antibody Screen: NEGATIVE
Unit division: 0

## 2020-02-21 LAB — SARS CORONAVIRUS 2 (TAT 6-24 HRS): SARS Coronavirus 2: NEGATIVE

## 2020-02-21 MED ORDER — SPIRONOLACTONE 12.5 MG HALF TABLET
12.5000 mg | ORAL_TABLET | Freq: Every day | ORAL | Status: DC
  Administered 2020-02-21 – 2020-02-22 (×2): 12.5 mg via ORAL
  Filled 2020-02-21 (×2): qty 1

## 2020-02-21 MED ORDER — PERFLUTREN LIPID MICROSPHERE
1.0000 mL | INTRAVENOUS | Status: AC | PRN
  Administered 2020-02-21: 3 mL via INTRAVENOUS
  Filled 2020-02-21: qty 10

## 2020-02-21 NOTE — Progress Notes (Signed)
Cardiology Progress Note  Patient ID: Paul Caprioosevelt Herbert Jr. MRN: [id number] DOB: [birth year] Date of Encounter: 02/21/2020  Primary Cardiologist: Rollene RotundaJames Hochrein, MD  Subjective   Chief Complaint: No SOB. Reports legs fell better.   HPI: Needs arterial embolic work-up. Covid positivity is an issue.   ROS:  All other ROS reviewed and negative. Pertinent positives noted in the HPI.     Inpatient Medications  Scheduled Meds: . sodium chloride   Intravenous Once  . ARIPiprazole  5 mg Oral Daily  . aspirin EC  81 mg Oral Daily  . atorvastatin  40 mg Oral QHS  . carvedilol  12.5 mg Oral BID WC  . Chlorhexidine Gluconate Cloth  6 each Topical Daily  . citalopram  20 mg Oral BID  . clopidogrel  75 mg Oral Daily  . docusate sodium  100 mg Oral Daily  . empagliflozin  25 mg Oral Daily  . enoxaparin (LOVENOX) injection  40 mg Subcutaneous Q24H  . insulin aspart  0-5 Units Subcutaneous QHS  . insulin aspart  0-9 Units Subcutaneous TID WC  . pantoprazole  40 mg Oral Daily  . sacubitril-valsartan  1 tablet Oral BID   Continuous Infusions: . magnesium sulfate bolus IVPB     PRN Meds: acetaminophen **OR** acetaminophen, alum & mag hydroxide-simeth, guaiFENesin-dextromethorphan, hydrALAZINE, magnesium sulfate bolus IVPB, morphine injection, ondansetron, oxyCODONE-acetaminophen, phenol, polyethylene glycol, potassium chloride, traZODone   Vital Signs   Vitals:   02/21/20 0600 02/21/20 0615 02/21/20 0645 02/21/20 0802  BP: (!) 138/97   (!) 152/92  Pulse:   75 72  Resp: 13 18 14 16   Temp:  98.5 F (36.9 C)  98.4 F (36.9 C)  TempSrc:  Oral  Oral  SpO2:   93% 95%  Weight:      Height:        Intake/Output Summary (Last 24 hours) at 02/21/2020 0832 Last data filed at 02/21/2020 0615 Gross per 24 hour  Intake 1160 ml  Output 1600 ml  Net -440 ml   Last 3 Weights 02/18/2020 02/16/2020 02/09/2020  Weight (lbs) 157 lb 13.6 oz 165 lb 5.5 oz 160 lb  Weight (kg) 71.6 kg 75 kg 72.576 kg       Telemetry  Overnight telemetry shows SR 70s, which I personally reviewed.   ECG  The most recent ECG shows SR 85 bpm, anteroseptal infarct, inferior infarct, which I personally reviewed.   Physical Exam   Vitals:   02/21/20 0600 02/21/20 0615 02/21/20 0645 02/21/20 0802  BP: (!) 138/97   (!) 152/92  Pulse:   75 72  Resp: 13 18 14 16   Temp:  98.5 F (36.9 C)  98.4 F (36.9 C)  TempSrc:  Oral  Oral  SpO2:   93% 95%  Weight:      Height:         Intake/Output Summary (Last 24 hours) at 02/21/2020 0832 Last data filed at 02/21/2020 0615 Gross per 24 hour  Intake 1160 ml  Output 1600 ml  Net -440 ml    Last 3 Weights 02/18/2020 02/16/2020 02/09/2020  Weight (lbs) 157 lb 13.6 oz 165 lb 5.5 oz 160 lb  Weight (kg) 71.6 kg 75 kg 72.576 kg    Body mass index is 22.02 kg/m.   General: Well nourished, well developed, in no acute distress Head: Atraumatic, normal size  Eyes: PEERLA, EOMI  Neck: Supple, no JVD Endocrine: No thryomegaly Cardiac: Normal S1, S2; RRR; no murmurs, rubs, or gallops Lungs: Clear to auscultation  bilaterally, no wheezing, rhonchi or rales  Abd: Soft, nontender, no hepatomegaly  Ext: leg incisions clean and dry, faint pulses  Musculoskeletal: No deformities, BUE and BLE strength normal and equal Skin: Warm and dry, no rashes   Neuro: Alert and oriented to person, place, time, and situation, CNII-XII grossly intact, no focal deficits  Psych: Normal mood and affect   Labs  High Sensitivity Troponin:  No results for input(s): TROPONINIHS in the last 720 hours.   Cardiac EnzymesNo results for input(s): TROPONINI in the last 168 hours. No results for input(s): TROPIPOC in the last 168 hours.  Chemistry Recent Labs  Lab 02/16/20 2044 02/17/20 2047 02/18/20 0600 02/19/20 0245 02/21/20 0426  NA 136   < > 136 135 139  K 3.1*   < > 3.9 3.5 3.6  CL 99   < > 102 104 105  CO2 27  --  GLUCOSE 126*   < > 150* 152* 115*  BUN 12   < > CREATININE 1.19   < > 1.00 0.95 0.96  CALCIUM 8.8*  --  8.2* 8.2* 8.2*  PROT 8.3*  --  7.3  --  6.9  ALBUMIN 3.1*  --  2.8*  --  2.3*  AST 49*  --  61*  --  37  ALT 21  --  18  --  16  ALKPHOS 48  --  37*  --  46  BILITOT 0.9  --  0.9  --  0.6  GFRNONAA >60  --  >60 >60 >60  GFRAA >60  --  >60 >60 >60  ANIONGAP 10  --  < > = values in this interval not displayed.    Hematology Recent Labs  Lab 02/19/20 0245 02/20/20 0550 02/21/20 0426  WBC 9.8 9.6 8.2  RBC 3.32* 3.61* 3.65*  HGB 7.7* 8.8* 8.8*  HCT 25.4* 28.7* 28.9*  MCV 76.5* 79.5* 79.2*  MCH 23.2* 24.4* 24.1*  MCHC 30.3 30.7 30.4  RDW 19.3* 20.0* 19.9*  PLT 232 237 230   BNPNo results for input(s): BNP, PROBNP in the last 168 hours.  DDimer No results for input(s): DDIMER in the last 168 hours.   Radiology  ECHOCARDIOGRAM LIMITED  Result Date: 02/19/2020    ECHOCARDIOGRAM LIMITED REPORT   Patient Name:   Paul [name]. Date of Exam: 02/19/2020 Medical Rec #:  [id number]             Height:       71.0 in Accession #:    [id number]            Weight:       157.8 lb Date of Birth:  05-04-1971            BSA:          1.907 m Patient Age:    48 years              BP:           121/82 mmHg Patient Gender: M                     HR:           78 bpm. Exam Location:  Inpatient Procedure: Limited Color Doppler, Color Doppler and Limited Echo Indications:    Embolism and thrombosis of arteries of lower extremity  History:        Patient  has prior history of Echocardiogram examinations, most                 recent 02/04/2018. Cardiomyopathy, CAD, Covid,                 Signs/Symptoms:Dyspnea; Risk Factors:Hypertension, Diabetes and                 Dyslipidemia.  Sonographer:    Delcie Roch Referring Phys: [id number] Nada Libman IMPRESSIONS  1. Left ventricular ejection fraction, by estimation, is 25 to 30%. The left ventricle has severely decreased function. The left ventricle demonstrates global hypokinesis. There is  mild left ventricular hypertrophy. Left ventricular diastolic parameters  are consistent with Grade I diastolic dysfunction (impaired relaxation).  2. Right ventricular systolic function is severely reduced. The right ventricular size is moderately enlarged. There is moderately elevated pulmonary artery systolic pressure.  3. The mitral valve is normal in structure. No evidence of mitral valve regurgitation. No evidence of mitral stenosis.  4. Tricuspid valve regurgitation is moderate.  5. The aortic valve is tricuspid. Aortic valve regurgitation is not visualized. No aortic stenosis is present.  6. The inferior vena cava is normal in size with greater than 50% respiratory variability, suggesting right atrial pressure of 3 mmHg. FINDINGS  Left Ventricle: Left ventricular ejection fraction, by estimation, is 25 to 30%. The left ventricle has severely decreased function. The left ventricle demonstrates global hypokinesis. The left ventricular internal cavity size was normal in size. There is mild left ventricular hypertrophy. Right Ventricle: The right ventricular size is moderately enlarged.Right ventricular systolic function is severely reduced. There is moderately elevated pulmonary artery systolic pressure. The tricuspid regurgitant velocity is 3.34 m/s, and with an assumed right atrial pressure of 3 mmHg, the estimated right ventricular systolic pressure is 47.6 mmHg. Left Atrium: Left atrial size was normal in size. Right Atrium: Right atrial size was normal in size. Pericardium: Trivial pericardial effusion is present. Mitral Valve: The mitral valve is normal in structure. Normal mobility of the mitral valve leaflets. Mild mitral annular calcification. No evidence of mitral valve stenosis. Tricuspid Valve: The tricuspid valve is normal in structure. Tricuspid valve regurgitation is moderate . No evidence of tricuspid stenosis. Aortic Valve: The aortic valve is tricuspid. Aortic valve regurgitation is not  visualized. No aortic stenosis is present. Pulmonic Valve: The pulmonic valve was normal in structure. Pulmonic valve regurgitation is mild. No evidence of pulmonic stenosis. Aorta: The aortic root is normal in size and structure. Venous: The inferior vena cava is normal in size with greater than 50% respiratory variability, suggesting right atrial pressure of 3 mmHg.  LEFT VENTRICLE PLAX 2D LVIDd:         4.40 cm      Diastology LVIDs:         3.10 cm      LV e' lateral:   8.38 cm/s LV PW:         1.20 cm      LV E/e' lateral: 7.4 LV IVS:        1.10 cm      LV e' medial:    4.90 cm/s LVOT diam:     2.30 cm      LV E/e' medial:  12.6 LV SV:         54 LV SV Index:   29 LVOT Area:     4.15 cm  LV Volumes (MOD) LV vol d, MOD A2C: 106.0 ml LV vol d, MOD A4C: 105.0  ml LV vol s, MOD A2C: 80.2 ml LV vol s, MOD A4C: 63.5 ml LV SV MOD A2C:     25.8 ml LV SV MOD A4C:     105.0 ml LV SV MOD BP:      36.3 ml RIGHT VENTRICLE            IVC RV S prime:     9.68 cm/s  IVC diam: 1.80 cm TAPSE (M-mode): 0.7 cm LEFT ATRIUM         Index LA diam:    3.10 cm 1.63 cm/m  AORTIC VALVE LVOT Vmax:   79.00 cm/s LVOT Vmean:  52.300 cm/s LVOT VTI:    0.131 m  AORTA Ao Root diam: 3.50 cm MITRAL VALVE               TRICUSPID VALVE MV Area (PHT): 5.13 cm    TR Peak grad:   44.6 mmHg MV Decel Time: 148 msec    TR Vmax:        334.00 cm/s MV E velocity: 61.70 cm/s MV A velocity: 78.40 cm/s  SHUNTS MV E/A ratio:  0.79        Systemic VTI:  0.13 m                            Systemic Diam: 2.30 cm Olga Millers MD Electronically signed by Olga Millers MD Signature Date/Time: 02/19/2020/4:44:27 PM    Final     Cardiac Studies  TTE 02/19/2020 1. Left ventricular ejection fraction, by estimation, is 25 to 30%. The  left ventricle has severely decreased function. The left ventricle  demonstrates global hypokinesis. There is mild left ventricular  hypertrophy. Left ventricular diastolic parameters  are consistent with Grade I diastolic  dysfunction (impaired relaxation).  2. Right ventricular systolic function is severely reduced. The right  ventricular size is moderately enlarged. There is moderately elevated  pulmonary artery systolic pressure.  3. The mitral valve is normal in structure. No evidence of mitral valve  regurgitation. No evidence of mitral stenosis.  4. Tricuspid valve regurgitation is moderate.  5. The aortic valve is tricuspid. Aortic valve regurgitation is not  visualized. No aortic stenosis is present.  6. The inferior vena cava is normal in size with greater than 50%  respiratory variability, suggesting right atrial pressure of 3 mmHg.   Patient Profile  Paul [name]. is a 49 y.o. male with CAD s/p PCI in 2007, DM, HTN admitted 02/16/2020 with critical limb ischemia 2/2 bilateral femoral arterial thrombi.   Assessment & Plan   1. Arterial Thromboembolic Event -02/16/2020 with critical limb ischemia 2/2 bilateral femoral arteri thrombi s/p thrombectomy. Returned to OR for L popliteal artery stent and angioplasty of L posterior tibial/tiboperoneal trunk due to persistent occlusion.  -EF now 25-30% -on DAPT due to LE stent.  -needs echo with contrast which will be done today. -want him to get TEE for cardioembolic source. Will need to get off covid restrictions.  -also feel LHC cath warranted given DM and reduced EF. Again, needs to be off restrictions.  -would favor work-up prior to initiation of DOAC.  -we can do TEE and LHC tomorrow if off restrictions  2. Systolic HF, EF 25-30% -new reduction. Want to pursue LHC. Hold DOAC for now. Will attempt if can be off restrictions.  -continue coreg 12.5 mg BID -entresto 24-26 mg BID added -will add aldactone 12.5 mg BID -euvolemic on exam. Hold lasix.  For questions or updates, please contact [facility] Please consult [email or web address] for contact info under   Time Spent with Patient: I have spent a total of 25 minutes with patient  reviewing hospital notes, telemetry, EKGs, labs and examining the patient as well as establishing an assessment and plan that was discussed with the patient.  > 50% of time was spent in direct patient care.    Signed, Lenna Gilford. Flora Lipps, MD [facility]  [facility]  02/21/2020 8:32 AM

## 2020-02-21 NOTE — Progress Notes (Signed)
  Progress Note    02/21/2020 7:31 AM 4 Days Post-Op  Subjective:  No complaints this morning   Vitals:   02/21/20 0615 02/21/20 0645  BP:    Pulse:  75  Resp: 18 14  Temp: 98.5 F (36.9 C)   SpO2:  93%   Physical Exam: Lungs:  Non labored Incisions:  Groin incisions c/d/i; lower leg incisions healing well with staples in place Extremities:  R PT brisk by doppler; L ATA and PTA Neurologic: A&O  CBC    Component Value Date/Time   WBC 8.2 02/21/2020 0426   RBC 3.65 (L) 02/21/2020 0426   HGB 8.8 (L) 02/21/2020 0426   HGB 13.1 07/14/2019 1634   HCT 28.9 (L) 02/21/2020 0426   HCT 40.9 07/14/2019 1634   PLT 230 02/21/2020 0426   PLT 390 07/14/2019 1634   MCV 79.2 (L) 02/21/2020 0426   MCV 80 07/14/2019 1634   MCH 24.1 (L) 02/21/2020 0426   MCHC 30.4 02/21/2020 0426   RDW 19.9 (H) 02/21/2020 0426   RDW 14.8 07/14/2019 1634   LYMPHSABS 2.3 02/16/2020 2044   LYMPHSABS 1.9 07/14/2019 1634   MONOABS 0.6 02/16/2020 2044   EOSABS 0.0 02/16/2020 2044   EOSABS 0.1 07/14/2019 1634   BASOSABS 0.0 02/16/2020 2044   BASOSABS 0.0 07/14/2019 1634    BMET    Component Value Date/Time   NA 139 02/21/2020 0426   NA 138 07/14/2019 1634   K 3.6 02/21/2020 0426   CL 105 02/21/2020 0426   CO2 23 02/21/2020 0426   GLUCOSE 115 (H) 02/21/2020 0426   BUN 11 02/21/2020 0426   BUN 17 07/14/2019 1634   CREATININE 0.96 02/21/2020 0426   CALCIUM 8.2 (L) 02/21/2020 0426   GFRNONAA >60 02/21/2020 0426   GFRAA >60 02/21/2020 0426    INR    Component Value Date/Time   INR 1.1 (H) 08/24/2011 1002     Intake/Output Summary (Last 24 hours) at 02/21/2020 0731 Last data filed at 02/21/2020 0615 Gross per 24 hour  Intake 1160 ml  Output 1600 ml  Net -440 ml     Assessment/Plan:  49 y.o. male is s/p BLE thrombectomy with L popliteal stent and fasciotomies 4 Days Post-Op   Feet well perfused with signals by doppler Incisions healing well Minimal motor and sensation below the knee  LLE PT/OT recommending HH Ok for discharge home; office will arrange follow up in 2 weeks As previously discussed, patient will be on aspirin, plavix and DOAC   Emilie Rutter, PA-C [facility] [phone] 02/21/2020 7:31 AM

## 2020-02-21 NOTE — Progress Notes (Addendum)
Patient had a known Covid pneumonia in 10/2019, tested positive on 3/27.  On admission 7/10 his Covid PCR was still positive, however he is asymptomatic at this point.  No Covid testing in between this time.  Currently on precautions, however cardiology would like to proceed with TEE/LHC on a nonemergent basis tomorrow if possible.  Spoke with ID, Dr. Orvan Falconer, regarding this.  Recommended reaching out to infection prevention.  Spoke with IP, who stated since he is >90 days out from his first positive test, that he should remain quarantined for 10 days from his most recent positive test.  She is also going to touch base with her team to confirm this and will update if there is any changes to these recommendations.  Will maintain precautions as is and recommend quarantine through 7/20 on discharge.  UPDATE 31: Spoke with IP, stated we could try to test out of quarantine.  Will repeat PCR, if negative x2 24 hours apart, can d/c precautions.   Allayne Stack, DO

## 2020-02-21 NOTE — Care Management (Signed)
1005 02-21-20 Case Manager received referral for Entresto 24-26 mg BID co pay. Case Manager submitted benefits check and will follow for cost. Graves-Bigelow, Lamar Laundry, RN, BSN

## 2020-02-21 NOTE — Progress Notes (Signed)
Echocardiogram 2D Echocardiogram has been performed.  Warren Lacy Vianka Ertel 02/21/2020, 9:41 AM

## 2020-02-21 NOTE — Anesthesia Postprocedure Evaluation (Signed)
Anesthesia Post Note  Patient: Paul [name].  Procedure(s) Performed: LOWER EXTREMITY ANGIOGRAM WITH Stenting Left Popliteal Artery, Angioplasty posterior Tibial Artery (Left ) FASCIOTOMY CLOSURE (Left Leg Lower)     Patient location during evaluation: PACU Anesthesia Type: General Level of consciousness: awake and alert Pain management: pain level controlled Vital Signs Assessment: post-procedure vital signs reviewed and stable Respiratory status: spontaneous breathing, nonlabored ventilation, respiratory function stable and patient connected to nasal cannula oxygen Cardiovascular status: blood pressure returned to baseline and stable Postop Assessment: no apparent nausea or vomiting Anesthetic complications: no   No complications documented.  Last Vitals:  Vitals:   02/21/20 0802 02/21/20 1100  BP: (!) 152/92 (!) 145/95  Pulse: 72 71  Resp: 16 18  Temp: 36.9 C 37 C  SpO2: 95% 96%    Last Pain:  Vitals:   02/21/20 1100  TempSrc: Oral  PainSc:                  CHRISTOPHER MOSER

## 2020-02-21 NOTE — TOC Benefit Eligibility Note (Signed)
Transition of Care [facility]) Benefit Eligibility Note    Patient Details  Name: Paul [name]. MRN: [id number] Date of Birth: January 13, 1971   Medication/Dose: Delene Loll 24-52m bid for 30 day supply  Covered?: Yes  Tier: 3 Drug  Prescription Coverage [facility]erred Pharm[facility]  Spoke with Person/Company/Phone Number:: [employer]son [employer] help desk [phone] Co-Pay: $34.00  Prior Approval: No  Deductible: Met       HShelda AltesPhone Number: 02/21/2020, 12:20 PM

## 2020-02-21 NOTE — Discharge Instructions (Addendum)
Dear Paul [name].,  Thank you for letting us participate in your care. You were treated for blood clots in your legs.   POST-HOSPITAL & CARE INSTRUCTIONS 1. We started you on new medication per the cardiologist recommendations: Eliquis 5 mg twice daily, Plavix 75 mg daily, Lasix 40 mg daily as needed for fluid or edema, Entresto 24-26-26 mg twice daily, Aldactone 25 mg daily. 2. We increased your Lipitor from 20 mg to 40 mg daily 3. You will need to see your PCP office for hospital follow-up 02/26/2020 at 1:50 PM. 4. You need to follow-up with a vein vascular surgery. 5. You will need to follow-up with cardiology. 6. Go to your follow up appointments (listed below)   DOCTOR'S APPOINTMENT   Future Appointments  Date Time Provider Department Center  02/26/2020  1:50 PM ACCESS TO CARE POOL [facility]  03/11/2020  9:00 AM Nada Libman, MD [facility]  03/12/2020  2:15 PM Abelino Derrick, PA-C [facility]  04/19/2020 10:00 AM Rollene Rotunda, MD [facility]    Follow-up Information    Care, [facility] Follow up.   Specialty: Home Health Services Why: HHPT arranged- they will contact you within 48 hrs of discharge for start of care Contact information: [address] [place] [address] [phone]        [facility] Follow up.   Why: rolling walker and 3n1 arranged- to be delivered to room prior to discharge Contact [address] [place] [address] [phone]        Rollene Rotunda, MD Follow up on 03/12/2020.   Specialty: Cardiology Why: at 2:15 pm with his PA [facility].   Contact information: [address] [place] [address] [phone]        [facility]. Go on 02/26/2020.   Specialty: Family Medicine Why: @1 :50 PM for a hospital follow up Contact information: [address] [place]  [place] [phone]              Take care and be well!  Family Medicine Teaching Service Inpatient Team [place]  [facility]  [address] [place], [place] [phone]       Sacubitril-Valsartan Oral Tablets What is this medicine? SACUBITRIL; VALSARTAN (sak UE bi tril; val SAR tan) is a combination of a neprilysin inhibitor and a an angiotensin II receptor blocker. It treats heart failure. This medicine may be used for other purposes; ask your health care provider or pharmacist if you have questions. COMMON BRAND NAME(S): Entresto  What should I tell my health care provider before I take this medicine? They need to know if you have any of these conditions:  diabetes and take a medicine that contains aliskiren  kidney disease  liver disease  an unusual or allergic reaction to sacubitril; valsartan, drugs called angiotensin converting enzyme (ACE) inhibitors, angiotensin II receptor blockers (ARBs), other medicines, foods, dyes, or preservatives  pregnant or trying to get pregnant  breast-feeding  How should I use this medicine? Take this drug by mouth. Take it as directed on the prescription label at the same time every day. You can take it with or without food. If it upsets your stomach, take it with food. Keep taking it unless your health care provider tells you to stop. Talk to your health care provider about the use of this drug in children. While it may  be prescribed for children as young as 1 for selected conditions, precautions do apply. Overdosage: If you think you have taken too much of this medicine contact a poison control center or emergency room at once. NOTE: This medicine is only for you. Do not share this medicine with others. What if I miss a dose? If you miss a dose, take it as soon as you can. If it is almost time for your next dose, take only that dose. Do not take double or extra doses.  What may interact with  this medicine? Do not take this medicine with any of the following medicines:  aliskiren if you have diabetes  angiotensin-converting enzyme (ACE) inhibitors, like benazepril, captopril, enalapril, fosinopril, lisinopril, or ramipril This medicine may also interact with the following medicines:  angiotensin II receptor blockers (ARBs) like azilsartan, candesartan, eprosartan, irbesartan, losartan, olmesartan, telmisartan, or valsartan  lithium  NSAIDS, medicines for pain and inflammation, like ibuprofen or naproxen  potassium-sparing diuretics like amiloride, spironolactone, and triamterene  potassium supplements This list may not describe all possible interactions. Give your health care provider a list of all the medicines, herbs, non-prescription drugs, or dietary supplements you use. Also tell them if you smoke, drink alcohol, or use illegal drugs. Some items may interact with your medicine.  What should I watch for while using this medicine? Tell your doctor or healthcare professional if your symptoms do not start to get better or if they get worse. Do not become pregnant while taking this medicine. Women should inform their doctor if they wish to become pregnant or think they might be pregnant. There is a potential for serious side effects to an unborn child. Talk to your health care professional or pharmacist for more information. You may get dizzy. Do not drive, use machinery, or do anything that needs mental alertness until you know how this medicine affects you. Do not stand or sit up quickly, especially if you are an older patient. This reduces the risk of dizzy or fainting spells. Avoid alcoholic drinks; they can make you more dizzy.  What side effects may I notice from receiving this medicine? Side effects that you should report to your doctor or health care professional as soon as possible:  allergic reactions like skin rash, itching or hives, swelling of the face, lips, or  tongue  signs and symptoms of increased potassium like muscle weakness; chest pain; or fast, irregular heartbeat  signs and symptoms of kidney injury like trouble passing urine or change in the amount of urine  signs and symptoms of low blood pressure like feeling dizzy or lightheaded, or if you develop extreme fatigue Side effects that usually do not require medical attention (report to your doctor or health care professional if they continue or are bothersome):  cough This list may not describe all possible side effects. Call your doctor for medical advice about side effects. You may report side effects to FDA at 1-800-FDA-1088.  Where should I keep my medicine? Keep out of the reach of children and pets. Store at room temperature between 20 and 25 degrees C (68 and 77 degrees F). Protect from moisture. Keep the container tightly closed. Throw away any unused drug after the expiration date. NOTE: This sheet is a summary. It may not cover all possible information. If you have questions about this medicine, talk to your doctor, pharmacist, or health care provider.  2020 Elsevier/Gold Standard (2019-03-01 16:03:07)  ============================================== Information on my medicine - ELIQUIS (apixaban)  This medication  education was reviewed with me or my healthcare representative as part of my discharge preparation.  The pharmacist that spoke with me during my hospital stay was:  Leander Rams, [facility]  Why was Eliquis prescribed for you? Eliquis was prescribed to treat blood clots that may have been found in the veins of your legs (deep vein thrombosis) or in your lungs (pulmonary embolism) and to reduce the risk of them occurring again.  What do You need to know about Eliquis ? The dose is ONE 5 mg tablet taken TWICE daily.  Eliquis may be taken with or without food.   Try to take the dose about the same time in the morning and in the evening. If you have difficulty swallowing  the tablet whole please discuss with your pharmacist how to take the medication safely.  Take Eliquis exactly as prescribed and DO NOT stop taking Eliquis without talking to the doctor who prescribed the medication.  Stopping may increase your risk of developing a new blood clot.  Refill your prescription before you run out.  After discharge, you should have regular check-up appointments with your healthcare provider that is prescribing your Eliquis.    What do you do if you miss a dose? If a dose of ELIQUIS is not taken at the scheduled time, take it as soon as possible on the same day and twice-daily administration should be resumed. The dose should not be doubled to make up for a missed dose.  Important Safety Information A possible side effect of Eliquis is bleeding. You should call your healthcare provider right away if you experience any of the following: ? Bleeding from an injury or your nose that does not stop. ? Unusual colored urine (red or dark brown) or unusual colored stools (red or black). ? Unusual bruising for unknown reasons. ? A serious fall or if you hit your head (even if there is no bleeding).  Some medicines may interact with Eliquis and might increase your risk of bleeding or clotting while on Eliquis. To help avoid this, consult your healthcare provider or pharmacist prior to using any new prescription or non-prescription medications, including herbals, vitamins, non-steroidal anti-inflammatory drugs (NSAIDs) and supplements.  This website has more information on Eliquis (apixaban): http://www.eliquis.com/eliquis/home

## 2020-02-21 NOTE — Progress Notes (Signed)
Family Medicine Teaching Service Daily Progress Note Intern Pager: [phone]  Patient name: Paul [name]. Medical record number: [id number] Date of birth: 1971-05-07 Age: 49 y.o. Gender: male  Primary Care Provider: Unknown Jim, [name] Consultants: [facility] Code Status: Full  Pt Overview and Major Events to Date:  Admitted 7/10 7/10 AM bilateral femoral thrombectomy and LLE fasciotomy 7/10 PM successful recanalization of left popliteal artery, posterior tibial artery, closure of LLE fasciotomy  Assessment and Plan: Paul [name] is a 49 year old male presenting with worsening leg weakness and numbness, found to have critical limb ischemia.  Covid positive on admission.  PMH significant for type 2 diabetes, CAD, hypertension, recent COVID-19 illness and 3/21, hyperlipidemia, cardiomyopathy.  CAD HFmrEF w/ EF 40-45%: Chronic, stable. Asymptomatic. Follows with Dr. Hochrein,cardiology. Echo 7/12 shows worsening failure.  Per cardiology recommendation, repeat echo with contrast evaluated for thrombus: No apical thrombus found. Depending on Covid status (and being able to clear from Covid precautions) may [name] TEE/LHC inpatient.  Awaiting Covid results. -Continue home Coreg12.5 twice daily,Imdur30daily -Continue home atorvastatin20 daily at bedtime -Will restart home quinapril-HCTZ if needed -Hold home ASA while on heparin  Critical limb ischemia, s/p thrombectomy: Acute. Postop day #3. Vascular surgery recommends aspirin, Plavix, and statin for PAD; PT recommendations and clearance; Lovenox and Protonix for prophylaxis.  Today during morning exam, right foot felt cool to the touch and I could not appreciate good pulses on Doppler.  Vascular surgery PA paged, awaiting response. -Plavix75 mg daily, aspirin 81 mg daily per [facility] recommendations -Atorvastatin 40 mg daily at bedtime -Tylenol 650 mg every 6 as needed -Percocet 5-325 mg every 4 as needed (moderate pain),  morphine 2 g daily as needed (severe pain)  Recent COVID-19 illness: Stable. Covid PCR on admit still positive, [name] not have a negative test in between known Covid illness requiring hospitalization in 10/2019. Currently asymptomatic, suspect continuation from illness several months ago.  Currently awaiting today's Covid test to start working towards Covid clearance, all which depends further cardiac work-up inpatient versus outpatient. -Should not need precautions  Microcytic anemia: Chronic. Hemoglobin today 9.3. -Continue to monitor CBC -Follow-up outpatient   T2DM: Chronic, stable. A1c 7.7 in 01/2020.Admission glucose 126 >107 >150 >152. Takes Metformin, Jardiance, and Lantus 15 Uat home. -SSI -CBGs with meals/nightly -Monitor BMP -Continue Jardiance 10 mg oral daily  Hypertension: Chronic, stable. Systolicgenerally 100-120s.Episode of hypotension overnight 80/57, responded well to 500 mLbolus. Takes Imdur, Coreg, and quinapril-HCTZ at home. -Monitor BP -Continue home Imdur30 mg dailyand Coreg12.5 mg twice daily with meals -Will restart Enalapril-HCTZ if needed for further control  BipolarIIdisorder: Chronic, stable. Mood appropriate. Takes Celexa20 mg twice dailyand Abilify5 mg daily. -Continue home regimen  Hypokalemia: Acute. K 3.1>3.3 >11mEq Kdur >3.9> 3.5. -continue to f/u morning BMPs  FEN/GI: Carb modified PPx: Lovenox 40 mg subcu every 24  Disposition: Progressive  Subjective: Paul [name] was found sitting up in bed this morning.  He reported getting some sleep last night.  No complaints.  He felt like his feet were still doing great, and his left foot was getting better than yesterday.  Objective: Temp:  [98.4 F (36.9 C)-99.8 F (37.7 C)] 98.6 F (37 C) (07/14 1100) Pulse Rate:  [71-85] 71 (07/14 1100) Resp:  [13-23] 18 (07/14 1100) BP: (129-153)/(92-97) 145/95 (07/14 1100) SpO2:  [93 %-100 %] 96 % (07/14 1100)  Physical  Exam: General: Awake, alert, oriented Cardiovascular: Regular rate and rhythm, no murmur appreciated Respiratory: Clear to auscultation bilaterally Extremities: No BLE edema, left  foot dopplerable pulses, right foot cool to the touch (a change from yesterday), had difficulty appreciating dopplered pulses on right.  Laboratory: Recent Labs  Lab 02/20/20 0550 02/21/20 0426 02/21/20 1214  WBC 9.6 8.2 8.3  HGB 8.8* 8.8* 9.3*  HCT 28.7* 28.9* 30.8*  PLT 237 230 245   Recent Labs  Lab 02/16/20 2044 02/17/20 2047 02/18/20 0600 02/19/20 0245 02/21/20 0426  NA 136   < > 136 135 139  K 3.1*   < > 3.9 3.5 3.6  CL 99   < > 102 104 105  CO2 27  --  22 25 23   BUN 12   < > 14 12 11   CREATININE 1.19   < > 1.00 0.95 0.96  CALCIUM 8.8*  --  8.2* 8.2* 8.2*  PROT 8.3*  --  7.3  --  6.9  BILITOT 0.9  --  0.9  --  0.6  ALKPHOS 48  --  37*  --  46  ALT 21  --  18  --  16  AST 49*  --  61*  --  37  GLUCOSE 126*   < > 150* 152* 115*   < > = values in this interval not displayed.   Imaging/Diagnostic Tests: ECHOCARDIOGRAM LIMITED  Result Date: 02/21/2020    ECHOCARDIOGRAM LIMITED REPORT   Patient Name:   Paul [name]. Date of Exam: 02/21/2020 Medical Rec #:  Paul [name]             Height:       71.0 in Accession #:    02/23/2020            Weight:       157.8 lb Date of Birth:  October 18, 1970            BSA:          1.907 m Patient Age:    48 years              BP:           137/124 mmHg Patient Gender: M                     HR:           75 bpm. Exam Location:  Inpatient Procedure: Limited Echo and Intracardiac Opacification Agent Indications:    I50.21 Acute systolic (congestive) heart failure  History:        Patient has prior history of Echocardiogram examinations, most                 recent 02/19/2020. CAD; Risk Factors:Hypertension, Diabetes and                 Dyslipidemia. COVID+ 11/04/19.  Sonographer:    04/21/2020 Senior RDCS Referring Phys: [id number] Paul [name]  Sonographer Comments:  Limited to assess for LV thrombus with contrast. IMPRESSIONS  1. No apical thrombus noted with the use of Definity contrast. Left ventricular ejection fraction, by estimation, is 20 to 25%. The left ventricle has severely decreased function. The left ventricle demonstrates global hypokinesis. FINDINGS  Left Ventricle: No apical thrombus noted with the use of Definity contrast. Left ventricular ejection fraction, by estimation, is 20 to 25%. The left ventricle has severely decreased function. The left ventricle demonstrates global hypokinesis. Definity  contrast agent was given IV to delineate the left ventricular endocardial borders. [id number] MD Electronically signed by Doreene Eland MD Signature Date/Time: 02/21/2020/1:04:23 PM    Final  Fayette Pho, MD 02/21/2020, 2:20 PM PGY-1, [facility] Family Medicine FPTS Intern pager: [phone], text pages welcome

## 2020-02-22 DIAGNOSIS — I5022 Chronic systolic (congestive) heart failure: Secondary | ICD-10-CM

## 2020-02-22 LAB — BASIC METABOLIC PANEL
Anion gap: 9 (ref 5–15)
BUN: 9 mg/dL (ref 6–20)
CO2: 27 mmol/L (ref 22–32)
Calcium: 8.2 mg/dL — ABNORMAL LOW (ref 8.9–10.3)
Chloride: 101 mmol/L (ref 98–111)
Creatinine, Ser: 1.01 mg/dL (ref 0.61–1.24)
GFR calc Af Amer: >60 mL/min (ref >60)
GFR calc non Af Amer: >60 mL/min (ref >60)
Glucose, Bld: 110 mg/dL — ABNORMAL HIGH (ref 70–99)
Potassium: 3.5 mmol/L (ref 3.5–5.1)
Sodium: 137 mmol/L (ref 135–145)

## 2020-02-22 LAB — CBC
HCT: 30 % — ABNORMAL LOW (ref 39.0–52.0)
Hemoglobin: 9.2 g/dL — ABNORMAL LOW (ref 13.0–17.0)
MCH: 24.1 pg — ABNORMAL LOW (ref 26.0–34.0)
MCHC: 30.7 g/dL (ref 30.0–36.0)
MCV: 78.7 fL — ABNORMAL LOW (ref 80.0–100.0)
Platelets: 275 10*3/uL (ref 150–400)
RBC: 3.81 MIL/uL — ABNORMAL LOW (ref 4.22–5.81)
RDW: 19.9 % — ABNORMAL HIGH (ref 11.5–15.5)
WBC: 7.3 10*3/uL (ref 4.0–10.5)
nRBC: 0.4 % — ABNORMAL HIGH (ref 0.0–0.2)

## 2020-02-22 LAB — GLUCOSE, CAPILLARY
Glucose-Capillary: 150 mg/dL — ABNORMAL HIGH (ref 70–99)
Glucose-Capillary: 173 mg/dL — ABNORMAL HIGH (ref 70–99)
Glucose-Capillary: 187 mg/dL — ABNORMAL HIGH (ref 70–99)

## 2020-02-22 LAB — SARS CORONAVIRUS 2 (TAT 6-24 HRS): SARS Coronavirus 2: NEGATIVE

## 2020-02-22 MED ORDER — APIXABAN 5 MG PO TABS: 5.0000 mg | ORAL_TABLET | Freq: Two times a day (BID) | ORAL | 0 refills | Status: DC

## 2020-02-22 MED ORDER — FUROSEMIDE 40 MG PO TABS
40.0000 mg | ORAL_TABLET | Freq: Every day | ORAL | 0 refills | Status: DC | PRN
Start: 2020-02-22 — End: 2020-06-14

## 2020-02-22 MED ORDER — CLOPIDOGREL BISULFATE 75 MG PO TABS: 75.0000 mg | ORAL_TABLET | Freq: Every day | ORAL | 0 refills | Status: DC

## 2020-02-22 MED ORDER — SACUBITRIL-VALSARTAN 24-26 MG PO TABS: 1.0000 | ORAL_TABLET | Freq: Two times a day (BID) | ORAL | 0 refills | Status: DC

## 2020-02-22 MED ORDER — SPIRONOLACTONE 25 MG PO TABS: 12.5000 mg | ORAL_TABLET | Freq: Every day | ORAL | 0 refills | Status: DC

## 2020-02-22 MED ORDER — ATORVASTATIN CALCIUM 40 MG PO TABS: 40.0000 mg | ORAL_TABLET | Freq: Every day | ORAL | 0 refills | Status: DC

## 2020-02-22 MED ORDER — APIXABAN 5 MG PO TABS
5.0000 mg | ORAL_TABLET | Freq: Two times a day (BID) | ORAL | Status: DC
  Administered 2020-02-22: 5 mg via ORAL
  Filled 2020-02-22: qty 1

## 2020-02-22 MED FILL — SPIRONOLACTONE 25 MG TABLET: 25 | 60 days supply | Qty: 30 | Fill #0

## 2020-02-22 MED FILL — ELIQUIS 5 MG TABLET: 5 | 30 days supply | Qty: 60 | Fill #0

## 2020-02-22 MED FILL — ENTRESTO 24 MG-26 MG TABLET: 24-26 | 30 days supply | Qty: 60 | Fill #0

## 2020-02-22 MED FILL — CLOPIDOGREL 75 MG TABLET: 75 | 30 days supply | Qty: 30 | Fill #0

## 2020-02-22 MED FILL — ATORVASTATIN CALCIUM 40 MG: 40 | 30 days supply | Qty: 30 | Fill #0

## 2020-02-22 MED FILL — FUROSEMIDE 40 MG TABLET: 40 | 30 days supply | Qty: 30 | Fill #0

## 2020-02-22 NOTE — Progress Notes (Signed)
Discharge instructions given to patient. IV removed, clean and intact. Medications and wound care reviewed. All questions answered. Equipment delivered to pt room. Pt escorted home with friend.  Hazle Nordmann, RN

## 2020-02-22 NOTE — Progress Notes (Signed)
Physical Therapy Treatment Patient Details Name: Paul [name]. MRN: [id number] DOB: [birth year] Today's Date: 02/22/2020    History of Present Illness 49 y.o. male presenting with critical limb ischemia s/p bilateral thrombectomy and L fasciotomy on 02/27/2020. PMHx significant for TB, PTSD, MI, HTN, and DM.      PT Comments    Pt making good progress with mobility.  Continues to have foot drop on L but verbalized and demonstrated understanding of HEP and importance of maintaining ROM.     Follow Up Recommendations  Home health PT;Supervision for mobility/OOB     Equipment Recommendations  Rolling walker with 5" wheels    Recommendations for Other Services       Precautions / Restrictions Precautions Precautions: Fall    Mobility  Bed Mobility Overal bed mobility: Independent                Transfers Overall transfer level: Needs assistance Equipment used: Rolling walker (2 wheeled) Transfers: Sit to/from Stand Sit to Stand: Supervision         General transfer comment: supervision for safety; able to do from normal height  Ambulation/Gait Ambulation/Gait assistance: Supervision Gait Distance (Feet): 75 Feet Assistive device: Rolling walker (2 wheeled) Gait Pattern/deviations: Step-through pattern;Decreased dorsiflexion - left;Decreased stride length Gait velocity: decr   General Gait Details: Pt with good posture but needed cues for RW proximity.  Continues to have foot drop on L but compensating well   Stairs             Wheelchair Mobility    Modified Rankin (Stroke Patients Only)       Balance Overall balance assessment: Needs assistance Sitting-balance support: Feet supported Sitting balance-Leahy Scale: Good     Standing balance support: Bilateral upper extremity supported Standing balance-Leahy Scale: Poor Standing balance comment: Reliant on UE support                            Cognition Arousal/Alertness:  Awake/alert Behavior During Therapy: WFL for tasks assessed/performed Overall Cognitive Status: Within Functional Limits for tasks assessed                                        Exercises General Exercises - Lower Extremity Ankle Circles/Pumps: PROM;Left;10 reps Long Arc Quad: AROM;Both;10 reps;Seated Hip Flexion/Marching: AROM;Both;10 reps;Seated Other Exercises Other Exercises: hip ADD pillow squeeze and knee flexion x 10 Other Exercises: L ankle PF stretch 30 sec x 3    General Comments General comments (skin integrity, edema, etc.): VSS on RA.  Pt educated on HEP.  Demonstrated use of belt for L ankle dorsiflexion.  Stressed importance on maintaining L ankle ROM and f/u with MD if not improved.  Educated on AFO if dorsiflexion not improved      Pertinent Vitals/Pain Pain Assessment: Faces Faces Pain Scale: Hurts a little bit Pain Location: LLE Pain Descriptors / Indicators: Discomfort Pain Intervention(s): Limited activity within patient's tolerance;Monitored during session    Home Living                      Prior Function            PT Goals (current goals can now be found in the care plan section) Acute Rehab PT Goals Patient Stated Goal: None stated PT Goal Formulation: With patient Time For Goal Achievement: 02/25/20 Potential  to Achieve Goals: Good Progress towards PT goals: Progressing toward goals    Frequency    Min 3X/week      PT Plan Current plan remains appropriate    Co-evaluation              AM-PAC PT "6 Clicks" Mobility   Outcome Measure  Help needed turning from your back to your side while in a flat bed without using bedrails?: None Help needed moving from lying on your back to sitting on the side of a flat bed without using bedrails?: None Help needed moving to and from a bed to a chair (including a wheelchair)?: None Help needed standing up from a chair using your arms (e.g., wheelchair or bedside  chair)?: None Help needed to walk in hospital room?: None Help needed climbing 3-5 steps with a railing? : A Little 6 Click Score: 23    End of Session Equipment Utilized During Treatment: Gait belt Activity Tolerance: Patient tolerated treatment well Patient left: with call bell/phone within reach;in bed;with SCD's reapplied;with bed alarm set (SCD R LE only) Nurse Communication: Mobility status PT Visit Diagnosis: Difficulty in walking, not elsewhere classified (R26.2);Pain Pain - Right/Left: Left Pain - part of body: Leg     Time: 1232-1300 PT Time Calculation (min) (ACUTE ONLY): 28 min  Charges:  $Gait Training: 8-22 mins $Therapeutic Exercise: 8-22 mins                     Paul [name], PT Acute Rehab Services Pager [phone] Paul [name] Rehab [phone]     Paul [name] 02/22/2020, 1:22 PM

## 2020-02-22 NOTE — Hospital Course (Addendum)
Follow up recs:  Clarify anti-coag medications: ASA and Plavix for 3 months recommended by VVS d/t DES. Cardiology added Eliquis to regime. VVS were ok with full anticoagulation for right now. Will need to clarify when patient has follow up with specialist.

## 2020-02-22 NOTE — Progress Notes (Addendum)
Family Medicine Teaching Service Daily Progress Note Intern Pager: [phone]  Patient name: Paul [name]. Medical record number: [id number] Date of birth: 1970-10-22 Age: 49 y.o. Gender: male  Primary Care Provider: Unknown Jim, DO Consultants: Cardiology, VVS Code Status: Full  Pt Overview and Major Events to Date:  Admitted 7/10 7/10 AM bilateral femoral thrombectomy and LLE fasciotomy 7/10 PM successful recanalization of left popliteal artery,posterior tibial artery, closure of LLE fasciotomy  Assessment and Plan: Mr. Farnell is a 49 year old male presenting with worsening leg weakness and numbness, found have critical limb ischemia.  PMH sniffing for type 2 diabetes, CAD, HTN, recent Covid illness (3/21), hyperlipidemia, cardiomyopathy.  CAD HFmrEF w/ EF 40-45%: Chronic, worsening. Asymptomatic. Follows with Dr. Hochrein,cardiology.Echo 7/12 shows worsening failure.    Per cards rec, repeat echo negative for LV thrombus.  Cardiology has decided that they will not need to do TEE/possible LHC inpatient.  He is clear for discharge from their perspective.  He will follow up with cardiology outpatient. -Follow-up Covid results from this morning -Continue home Coreg 12.5 twice daily, Imdur 30 daily -Continue home atorvastatin 20 daily nightly -Consider restarting home quinapril-HCTZ 20-25 mg daily if needed -Per VVS recs, ASA 81 mg daily, Plavix 75 mg daily (for DES) -VVS recommends full anticoagulation  Critical limb ischemia, s/p thrombectomy: Acute. Postop day #5.  -Per VVS recs, ASA 81 mg daily, Plavix 75 mg daily (for DES) -VVS recommends full anticoagulation -Atorvastatin 40 mg daily nightly -Tylenol 650 mg every 6 as needed -Percocet 5-325 mg every 4 as needed (moderate pain), morphine 2 g daily as needed (severe pain)  Recent COVID-19 illness: Stable. History of Covid requiring hospitalization 10/2019.  Admission Covid PCR positive.  Currently  asymptomatic.  Negative Covid test 7/14 a.m.  If negative test x2, cardiology can move forward with inpatient TEE/LHC. -Currently awaiting repeat Covid test this morning, 7/15  Microcytic anemia: Chronic. Hemoglobin today is morning at 9.2.   -Continue to monitor CBC -Follow-up outpatient   T2DM: Chronic, stable. A1c 7.7 in 01/2020.Admission glucose 126 >107 >150 >152 >110. Takes Metformin, Jardiance, and Lantus 15 Uat home. -SSI -CBGs with meals/nightly -Monitor BMP -Continue Jardiance 10 mg oral daily  Hypertension: Chronic, stable. Last 24 hours, systolic BP 110-151, diastolic 61-1 03, pulse 70-84.  Takes Imdur, Coreg, and quinapril-HCTZ at home. -Monitor BP -Continue home Imdur30 mg dailyand Coreg12.5 mg twice daily with meals -Will restart Enalapril-HCTZ if needed for further control  BipolarIIdisorder: Chronic, stable. Mood appropriate. Takes Celexa20 mg twice dailyand Abilify5 mg daily. -Continue home regimen  Hypokalemia: Resolved Potassium 3.5 this morning.. -continue to f/u morning BMPs   FEN/GI: Carb modified PPx: Lovenox 40 mg subcu every 24  Disposition: Medically stable, ready for discharge home.  Subjective:  Mr. Nordling is doing well this morning, is in good spirits.  Feels as though his legs are getting better every day.  No specific complaints.  At the time of our conversation, we are awaiting second Covid results in order for possible inpatient cardio procedures.  However, later cardiology decided he can discharge and they will follow up with him outpatient regarding possible procedures.  Objective: Temp:  [97.8 F (36.6 C)-99.2 F (37.3 C)] 97.8 F (36.6 C) (07/15 1134) Pulse Rate:  [70-84] 71 (07/15 1134) Resp:  [13-20] 16 (07/15 1134) BP: (110-154)/(61-103) 125/89 (07/15 1134) SpO2:  [79 %-100 %] 100 % (07/15 1134)  Physical Exam: General: Awake, alert, oriented Cardiovascular: Regular rate and rhythm, no murmurs  auscultated Respiratory: Lungs clear  to auscultation bilaterally Abdomen: Soft, nondistended Extremities: No BLE edema, both feet warm with palpable pulses.  Laboratory: Recent Labs  Lab 02/21/20 0426 02/21/20 1214 02/22/20 0449  WBC 8.2 8.3 7.3  HGB 8.8* 9.3* 9.2*  HCT 28.9* 30.8* 30.0*  PLT 230 245 275   Recent Labs  Lab 02/16/20 2044 02/17/20 2047 02/18/20 0600 02/18/20 0600 02/19/20 0245 02/21/20 0426 02/22/20 0449  NA 136   < > 136   < > 135 139 137  K 3.1*   < > 3.9   < > 3.5 3.6 3.5  CL 99   < > 102   < > 104 105 101  CO2 27  --  22   < > 25 23 27   BUN 12   < > 14   < > 12 11 9   CREATININE 1.19   < > 1.00   < > 0.95 0.96 1.01  CALCIUM 8.8*  --  8.2*   < > 8.2* 8.2* 8.2*  PROT 8.3*  --  7.3  --   --  6.9  --   BILITOT 0.9  --  0.9  --   --  0.6  --   ALKPHOS 48  --  37*  --   --  46  --   ALT 21  --  18  --   --  16  --   AST 49*  --  61*  --   --  37  --   GLUCOSE 126*   < > 150*   < > 152* 115* 110*   < > = values in this interval not displayed.    Imaging/Diagnostic Tests: No results found.   , MD 02/22/2020, 1:03 PM PGY-1, [facility] Family Medicine FPTS Intern pager: [phone], text pages welcome

## 2020-02-22 NOTE — TOC Transition Note (Signed)
Transition of Care [facility]) - CM/SW Discharge Note   Patient Details  Name: Jolly Bleicher. MRN: [id number] Date of Birth: 08-Apr-1971  Transition of Care [facility]) CM/SW Contact:  Lockie Pares, RN Phone Number: 02/22/2020, 4:53 PM   Clinical Narrative:    Ordered rollator for patient via [employer], they did not have a record of it being ordered previously, It is not in the room.  [facility] aware of patent discharge    Final next level of care: Home w Home Health Services Barriers to Discharge: Continued Medical Work up   Patient Goals and CMS Choice Patient states their goals for this hospitalization and ongoing recovery are:: return home CMS Medicare.gov Compare Post Acute Care list provided to:: Patient Choice offered to / list presented to : Patient  Discharge Placement                       Discharge Plan and Services   Discharge Planning Services: CM Consult Post Acute Care Choice: Home Health, Durable Medical Equipment          DME Arranged: 3-N-1, Walker rolling DME Agency: [employer]       HH Arranged: PT HH Agency: [facility] Date [facility] Agency Contacted: 02/20/20 Time HH Agency Contacted: 1445 Representative spoke with at [facility] Agency: Kandee Keen  Social Determinants of Health (SDOH) Interventions     Readmission Risk Interventions No flowsheet data found.

## 2020-02-22 NOTE — Progress Notes (Signed)
    Subjective  - POD #5  Feels good this am Walked yesterday No leg pain   Physical Exam:  Sensation is better today.  He says he has feeling on his left foot Still no change in motor fuction Brisk left PT doppler signal Monophasic right PT signal (unchanged)       Assessment/Plan:  POD #5  Stable from my perspective Cardiac work up ongoing I want him on ASA and Plavix for 3 months due to DES I am OK with full anticoagulation as this is likely a embolic event  Wells Corgan Mormile 6/57/8469 7:49 AM --  Vitals:   02/22/20 0024 02/22/20 0622  BP: (!) 132/92 (!) 151/93  Pulse: 80 82  Resp: 16 13  Temp: 98.9 F (37.2 C) 98.4 F (36.9 C)  SpO2: 97%     Intake/Output Summary (Last 24 hours) at 02/22/2020 0749 Last data filed at 02/22/2020 6295 Gross per 24 hour  Intake --  Output 2500 ml  Net -2500 ml     Laboratory CBC    Component Value Date/Time   WBC 7.3 02/22/2020 0449   HGB 9.2 (L) 02/22/2020 0449   HGB 13.1 07/14/2019 1634   HCT 30.0 (L) 02/22/2020 0449   HCT 40.9 07/14/2019 1634   PLT 275 02/22/2020 0449   PLT 390 07/14/2019 1634    BMET    Component Value Date/Time   NA 137 02/22/2020 0449   NA 138 07/14/2019 1634   K 3.5 02/22/2020 0449   CL 101 02/22/2020 0449   CO2 27 02/22/2020 0449   GLUCOSE 110 (H) 02/22/2020 0449   BUN 9 02/22/2020 0449   BUN 17 07/14/2019 1634   CREATININE 1.01 02/22/2020 0449   CALCIUM 8.2 (L) 02/22/2020 0449   GFRNONAA >60 02/22/2020 0449   GFRAA >60 02/22/2020 0449    COAG Lab Results  Component Value Date   INR 1.1 (H) 08/24/2011   No results found for: PTT  Antibiotics Anti-infectives (From admission, onward)   Start     Dose/Rate Route Frequency Ordered Stop   02/17/20 1600  ceFAZolin (ANCEF) IVPB 2g/100 mL premix        2 g 200 mL/hr over 30 Minutes Intravenous Every 8 hours 02/17/20 1439 02/18/20 0759       V. Charlena Cross, M.D., [facility]  [place] Office: [phone] Pager:  [phone]

## 2020-02-22 NOTE — Progress Notes (Signed)
Cardiology Progress Note  Patient ID: Paul [name]. MRN: [id number] DOB: [birth year] Date of Encounter: 02/22/2020  Primary Cardiologist: Rollene Rotunda, MD  Subjective   Chief Complaint: None.   HPI: LVEF 25% with apical akinesis. Likely source of embolism.   ROS:  All other ROS reviewed and negative. Pertinent positives noted in the HPI.     Inpatient Medications  Scheduled Meds: . sodium chloride   Intravenous Once  . ARIPiprazole  5 mg Oral Daily  . aspirin EC  81 mg Oral Daily  . atorvastatin  40 mg Oral QHS  . carvedilol  12.5 mg Oral BID WC  . Chlorhexidine Gluconate Cloth  6 each Topical Daily  . citalopram  20 mg Oral BID  . clopidogrel  75 mg Oral Daily  . docusate sodium  100 mg Oral Daily  . empagliflozin  25 mg Oral Daily  . enoxaparin (LOVENOX) injection  40 mg Subcutaneous Q24H  . insulin aspart  0-5 Units Subcutaneous QHS  . insulin aspart  0-9 Units Subcutaneous TID WC  . pantoprazole  40 mg Oral Daily  . sacubitril-valsartan  1 tablet Oral BID  . spironolactone  12.5 mg Oral Daily   Continuous Infusions: . magnesium sulfate bolus IVPB     PRN Meds: acetaminophen **OR** acetaminophen, alum & mag hydroxide-simeth, guaiFENesin-dextromethorphan, hydrALAZINE, magnesium sulfate bolus IVPB, morphine injection, ondansetron, oxyCODONE-acetaminophen, phenol, polyethylene glycol, potassium chloride, traZODone   Vital Signs   Vitals:   02/21/20 2107 02/22/20 0024 02/22/20 0622 02/22/20 0843  BP: (!) 150/103 (!) 132/92 (!) 151/93 (!) 154/96  Pulse: 70 80 82 82  Resp: 17 16 13 16   Temp: 99.2 F (37.3 C) 98.9 F (37.2 C) 98.4 F (36.9 C) 99 F (37.2 C)  TempSrc: Oral Oral Oral Oral  SpO2: (!) 79% 97%  100%  Weight:      Height:        Intake/Output Summary (Last 24 hours) at 02/22/2020 1001 Last data filed at 02/22/2020 02/24/2020 Gross per 24 hour  Intake --  Output 2100 ml  Net -2100 ml   Last 3 Weights 02/18/2020 02/16/2020 02/09/2020  Weight (lbs)  157 lb 13.6 oz 165 lb 5.5 oz 160 lb  Weight (kg) 71.6 kg 75 kg 72.576 kg      Telemetry  Overnight telemetry shows SR 70s, which I personally reviewed.   ECG  The most recent ECG shows SR 85 bpm, anteroseptal infarct, inferior infarct, which I personally reviewed.   Physical Exam   Vitals:   02/21/20 2107 02/22/20 0024 02/22/20 0622 02/22/20 0843  BP: (!) 150/103 (!) 132/92 (!) 151/93 (!) 154/96  Pulse: 70 80 82 82  Resp: 17 16 13 16   Temp: 99.2 F (37.3 C) 98.9 F (37.2 C) 98.4 F (36.9 C) 99 F (37.2 C)  TempSrc: Oral Oral Oral Oral  SpO2: (!) 79% 97%  100%  Weight:      Height:         Intake/Output Summary (Last 24 hours) at 02/22/2020 1001 Last data filed at 02/22/2020 02/24/2020 Gross per 24 hour  Intake --  Output 2100 ml  Net -2100 ml    Last 3 Weights 02/18/2020 02/16/2020 02/09/2020  Weight (lbs) 157 lb 13.6 oz 165 lb 5.5 oz 160 lb  Weight (kg) 71.6 kg 75 kg 72.576 kg    Body mass index is 22.02 kg/m.   General: Well nourished, well developed, in no acute distress Head: Atraumatic, normal size  Eyes: PEERLA, EOMI  Neck: Supple,  no JVD Endocrine: No thryomegaly Cardiac: Normal S1, S2; RRR; no murmurs, rubs, or gallops Lungs: Clear to auscultation bilaterally, no wheezing, rhonchi or rales  Abd: Soft, nontender, no hepatomegaly  Ext: No edema, faint LE pulses  Musculoskeletal: No deformities, BUE and BLE strength normal and equal Skin: Warm and dry, no rashes   Neuro: Alert and oriented to person, place, time, and situation, CNII-XII grossly intact, no focal deficits  Psych: Normal mood and affect   Labs  High Sensitivity Troponin:  No results for input(s): TROPONINIHS in the last 720 hours.   Cardiac EnzymesNo results for input(s): TROPONINI in the last 168 hours. No results for input(s): TROPIPOC in the last 168 hours.  Chemistry Recent Labs  Lab 02/16/20 2044 02/17/20 2047 02/18/20 0600 02/18/20 0600 02/19/20 0245 02/21/20 0426 02/22/20 0449  NA 136    < > 136   < > 135 139 137  K 3.1*   < > 3.9   < > 3.5 3.6 3.5  CL 99   < > 102   < > 104 105 101  CO2 27  --  22   < > 25 23 27   GLUCOSE 126*   < > 150*   < > 152* 115* 110*  BUN 12   < > 14   < > 12 11 9   CREATININE 1.19   < > 1.00   < > 0.95 0.96 1.01  CALCIUM 8.8*  --  8.2*   < > 8.2* 8.2* 8.2*  PROT 8.3*  --  7.3  --   --  6.9  --   ALBUMIN 3.1*  --  2.8*  --   --  2.3*  --   AST 49*  --  61*  --   --  37  --   ALT 21  --  18  --   --  16  --   ALKPHOS 48  --  37*  --   --  46  --   BILITOT 0.9  --  0.9  --   --  0.6  --   GFRNONAA >60  --  >60   < > >60 >60 >60  GFRAA >60  --  >60   < > >60 >60 >60  ANIONGAP 10  --  12   < > 6 11 9    < > = values in this interval not displayed.    Hematology Recent Labs  Lab 02/21/20 0426 02/21/20 1214 02/22/20 0449  WBC 8.2 8.3 7.3  RBC 3.65* 3.95* 3.81*  HGB 8.8* 9.3* 9.2*  HCT 28.9* 30.8* 30.0*  MCV 79.2* 78.0* 78.7*  MCH 24.1* 23.5* 24.1*  MCHC 30.4 30.2 30.7  RDW 19.9* 19.9* 19.9*  PLT 230 245 275   BNPNo results for input(s): BNP, PROBNP in the last 168 hours.  DDimer No results for input(s): DDIMER in the last 168 hours.   Radiology  ECHOCARDIOGRAM LIMITED  Result Date: 02/21/2020    ECHOCARDIOGRAM LIMITED REPORT   Patient Name:   Doral Ventrella. Date of Exam: 02/21/2020 Medical Rec #:  02/23/2020             Height:       71.0 in Accession #:    Paul [name]            Weight:       157.8 lb Date of Birth:  Mar 19, 1971            BSA:  1.907 m Patient Age:    49 years              BP:           137/124 mmHg Patient Gender: M                     HR:           75 bpm. Exam Location:  Inpatient Procedure: Limited Echo and Intracardiac Opacification Agent Indications:    I50.21 Acute systolic (congestive) heart failure  History:        Patient has prior history of Echocardiogram examinations, most                 recent 02/19/2020. CAD; Risk Factors:Hypertension, Diabetes and                 Dyslipidemia. COVID+ 11/04/19.   Sonographer:    Irving BurtonEmily Senior RDCS Referring Phys: [id number] Doreene ElandKEHINDE T ENIOLA  Sonographer Comments: Limited to assess for LV thrombus with contrast. IMPRESSIONS  1. No apical thrombus noted with the use of Definity contrast. Left ventricular ejection fraction, by estimation, is 20 to 25%. The left ventricle has severely decreased function. The left ventricle demonstrates global hypokinesis. FINDINGS  Left Ventricle: No apical thrombus noted with the use of Definity contrast. Left ventricular ejection fraction, by estimation, is 20 to 25%. The left ventricle has severely decreased function. The left ventricle demonstrates global hypokinesis. Definity  contrast agent was given IV to delineate the left ventricular endocardial borders. Chilton Siiffany Mokena MD Electronically signed by Chilton Siiffany Hanover MD Signature Date/Time: 02/21/2020/1:04:23 PM    Final     Cardiac Studies  TTE 02/21/2020  1. No apical thrombus noted with the use of Definity contrast. Left  ventricular ejection fraction, by estimation, is 20 to 25%. The left  ventricle has severely decreased function. The left ventricle demonstrates  global hypokinesis.   Patient Profile  Paul CapriRoosevelt Patchen Jr. is a 49 y.o. male with CAD s/p PCI in 2007, DM, HTN admitted 02/16/2020 with critical limb ischemia 2/2 bilateral femoral arterial thrombi.   Assessment & Plan   1. Arterial Thromboembolic Event/ -1/6/10967/04/2020 found to have critical limb ischemia secondary to bilateral femoral artery thrombi status post lobectomy.  Return to the OR for left popliteal artery stent and angioplasty of left posterior tibial/tibioperoneal trunk due to persistent occlusion. -EF found to be 25-30%.  He has apical akinesis.  Contrast echo demonstrates no LV thrombus but he does have stagnant blood flow in the region.  I highly suspect the source of his embolic event was his LV. -We will add Eliquis 5 mg twice daily.  He will remain on aspirin Plavix.  Will discuss with vascular  surgery if we can get by with just Plavix. -EF did drop but he reports no chest pain or trouble breathing.  We have optimize heart failure medications.  I think the best thing to do is to let him recover from his recent procedure and then pursue left heart catheterization at a later date. -I do not feel a TEE is necessary.  2.  Systolic heart failure, ejection fraction 25-30%, with regional wall motion abnormalities -Prior LAD infarct.  His apex is akinetic.  He had a prior infarct and echocardiogram did show this wall motion abnormality in the past.  His EF is now 25 to 30%. -Given he is recently had bilateral thrombectomies in the femoral arteries as well as peripheral stenting that required fasciotomy, I think  the best thing to do is let him go home and heal up.  He can follow with his primary care cardiologist for further evaluation such as left heart catheterization. -We will continue Coreg 12.5 mg twice daily.  Continue Entresto 24-26 mg twice daily.  Continue Aldactone 12.5 mg twice daily.  Further titration of medications can occur in the outpatient setting. -I would also discharged home on Lasix 40 mg as needed for increased weight gain or lower extremity edema.  [facility] will sign off.   Medication Recommendations: Eliquis 5 mg twice daily, aspirin 81 mg daily, Plavix 75 mg daily (discussed with vascular surgery if aspirin can be dropped), Coreg 12.5 mg twice daily, Entresto 24-26 mg twice daily, Aldactone 12.5 mg daily, Lasix 40 mg as needed daily Other recommendations (labs, testing, etc): Discussion of left heart catheterization will take place with his primary cardiologist Follow up as an outpatient: We will arrange follow-up for him to see Dr. Antoine Poche  For questions or updates, please contact [facility] Please consult [email or web address] for contact info under   Time Spent with Patient: I have spent a total of 25 minutes with patient reviewing hospital notes, telemetry, EKGs,  labs and examining the patient as well as establishing an assessment and plan that was discussed with the patient.  > 50% of time was spent in direct patient care.    Signed, Lenna Gilford. Flora Lipps, MD [place]  [facility]  02/22/2020 10:01 AM

## 2020-02-22 NOTE — TOC Transition Note (Signed)
Transition of Care [facility]) - CM/SW Discharge Note   Patient Details  Name: Paul [name]. MRN: [id number] Date of Birth: 08-01-1971  Transition of Care [facility]) CM/SW Contact:  Lockie Pares, RN Phone Number: 02/22/2020, 3:40 PM   Clinical Narrative:       Final next level of care: Home w Home Health Services Barriers to Discharge: Continued Medical Work up   Patient Goals and CMS Choice Patient states their goals for this hospitalization and ongoing recovery are:: return home CMS Medicare.gov Compare Post Acute Care list provided to:: Patient Choice offered to / list presented to : Patient  Discharge Placement  [facility] 3:1  And rollling walker for DME. [facility] for home health. Ordered PT/OT.  Will discharge today with equipment.  Frances Furbish made aware of d/c            Discharge Plan and Services   Discharge Planning Services: CM Consult Post Acute Care Choice: Home Health, Durable Medical Equipment          DME Arranged: 3-N-1, Walker rolling DME Agency: [employer]       HH Arranged: PT HH Agency: [facility] Date [facility] Agency Contacted: 02/20/20 Time HH Agency Contacted: 1445 Representative spoke with at [facility] Agency: Kandee Keen  Social Determinants of Health (SDOH) Interventions     Readmission Risk Interventions No flowsheet data found.

## 2020-02-24 NOTE — Discharge Summary (Addendum)
[facility] Discharge Summary  Patient name: Paul [name]. Medical record number: [id number] Date of birth: 01-Oct-1970 Age: 49 y.o. Gender: male Date of Admission: 02/16/2020  Date of Discharge: 02/22/2020 Admitting Physician: Patriciaann Clan, DO  Primary Care Provider: Cleophas Dunker, DO Consultants: Cardiology, vascular surgery  Indication for Hospitalization: Critical limb ischemia bilateral lower extremities  Discharge Diagnoses/Problem List:  BLE critical limb ischemia, s/p thrombectomy with LLE fasciotomy and L popliteal stent placement  HRmrEF Chronic microcytic anemia History of recent COVID-19 illness Type 2 diabetes mellitus Hypertension Bipolar 2 disorder  Disposition: Home with home PT/OT  Discharge Condition: Stable  Discharge Exam:  Blood pressure 131/89, pulse 79, temperature 98.5 F (36.9 C), temperature source Oral, resp. rate 16, height 5' 11"  (1.803 m), weight 71.6 kg, SpO2 100 %. General: Awake, alert, oriented Cardiovascular: Regular rate and rhythm, no murmurs auscultated Respiratory: Lungs clear to auscultation bilaterally Abdomen: Soft, nondistended Extremities: No BLE edema, both feet warm with palpable pulses.  Brief Hospital Course:  Mr. Goodbar is a 49 year old male presenting with worsening leg weakness and numbness, found to have critical limb ischemia.  PMH significant for type 2 diabetes, CAD, HTN, recent Covid illness (3/21), hyperlipidemia, cardiomyopathy.  BLE critical limb ischemia, s/p thrombectomy:  Presented on 7/10 due to worsening sudden bilateral leg weakness/numbness with cool extremities and inability to find PT/DP pulses via doppler, found to have critical limb ischemia. CTA showed nearly occlusive thrombosis within bilateral femoral arteries. Vascular surgery, Dr. Trula Slade, was consulted and performed an urgent bilateral iliofemoral thrombectomy and left lower extremity fasciotomy 7/10.   Unfortunately, revascularization and return of neurological function on the left was not achieved, so that evening he returned to the OR for successful recanalization of left popliteal artery, posterior tibial artery, and closure of the LLE fasciotomy. Started on ASA + plavix.   To evaluate for possible thromboembolic source, in addition to significant peripheral vascular disease, an echo was obtained. Echo 7/12 showed worsening EF of 25-30% (previously 40-45%), global hypokinesis, and without evidence of LV thrombus. Cardiology was consulted and ultimately decided to medically manage (initiated Entresto, Aldactone) and follow up outpatient for potential TEE/LHC to allow recovery from recent extensive procedures above. Suspected his LV was the source of embolic event. Started on full anti-coagulation, Eliquis BID, in addition to DAPT after discussion with vascular surgery. At discharge, he had a brisk left PT and monophasic right PT doppler signal.  Chronic microcytic anemia Hemoglobin trended downward, lowest 7.7 on 7/12; was subsequently transfused with 1 unit. Afterwards, hemoglobin 8.8 > 9.3 > 9.2 on day of discharge.  Will recommend outpatient follow-up of anemia to ensure stable hemoglobin.  History of recent COVID-19 illness Patient had a history of Covid requiring hospitalization in March 2021.  Admission Covid PCR test positive, however was asymptomatic.  Repeat Covid test 7/14 and 7/15 negative.  Issues for Follow Up:  1. BLE critical limb ischemia: Follow-up with vascular surgery outpatient.  2. Follow-up with cardiology outpatient for worsening heart failure and further evaluation of embolic source.  Potential for TTE and left heart cath outpatient. 3. Chronic microcytic anemia: Required 1 unit of blood while admitted, follow-up CBC outpatient. 4. Started on Entresto on 7/13, please obtain BMP at follow up.  5. Noted to have incidental questionable renal mass on CTA, performed f/u MRI  pelvis on 7/12 which showed no evidence of infiltrating neoplasm/mass, however did show possible changes consistent with pyelonephritis. Pt afebrile with no urinary s/sx. Consider  interval follow up imaging to ensure resolution.   Significant Procedures:  7/10 AM: bilateral femoral artery thrombectomy and LLE fasciotomy 7/10 PM: successful recanalization of left popliteal artery,posterior tibial artery, closure of LLE fasciotomy  Significant Labs and Imaging:  Recent Labs  Lab 02/21/20 0426 02/21/20 1214 02/22/20 0449  WBC 8.2 8.3 7.3  HGB 8.8* 9.3* 9.2*  HCT 28.9* 30.8* 30.0*  PLT 230 245 275   Recent Labs  Lab 02/17/20 2047 02/17/20 2047 02/18/20 0600 02/18/20 0600 02/19/20 0245 02/19/20 0245 02/21/20 0426 02/22/20 0449  NA 142  --  136  --  135  --  139 137  K 3.3*   < > 3.9   < > 3.5   < > 3.6 3.5  CL 102  --  102  --  104  --  105 101  CO2  --   --  22  --  25  --  23 27  GLUCOSE 107*  --  150*  --  152*  --  115* 110*  BUN 13  --  14  --  12  --  11 9  CREATININE 0.70  --  1.00  --  0.95  --  0.96 1.01  CALCIUM  --   --  8.2*  --  8.2*  --  8.2* 8.2*  ALKPHOS  --   --  37*  --   --   --  46  --   AST  --   --  61*  --   --   --  37  --   ALT  --   --  18  --   --   --  16  --   ALBUMIN  --   --  2.8*  --   --   --  2.3*  --    < > = values in this interval not displayed.    CT ANGIOGRAPHY CHEST, ABDOMEN AND PELVIS CT ANGIOGRAPHY AORTOBIFEM WITH RUNOFF TECHNIQUE: Multidetector CT imaging through the chest, abdomen and pelvis was performed using the standard protocol during bolus administration of intravenous contrast. Multiplanar reconstructed images and MIPs were obtained and reviewed to evaluate the vascular anatomy. Additional axial images obtained through the lower extremities to the feet using standard protocol during bolus administration of intravascular contrast. Multiplanar reconstructed images and MIPS provided. CONTRAST:  178m OMNIPAQUE IOHEXOL  350 MG/ML SOLN COMPARISON:  Chest CT dated 11/04/2019. FINDINGS: CTA CHEST FINDINGS Cardiovascular: No thoracic aortic aneurysm or evidence of aortic dissection. Borderline cardiomegaly. No pericardial effusion. No pulmonary embolism is seen within the main, lobar or segmental pulmonary arteries bilaterally. Coronary artery calcifications. Mediastinum/Nodes: There are scattered/numerous mildly prominent lymph nodes within the mediastinum, including a 1.1 cm short axis lymph node in the upper aortopulmonary window region space (series 5, image 60) and conglomerate small lymph nodes within the anterior mediastinum (series 5, image 73). Esophagus is unremarkable. Trachea central bronchi are unremarkable. Lungs/Pleura: Improved aeration of the lower lobes compatible with resolved pneumonias. There is chronic appearing bibasilar fibrosis. 3 mm perifissural nodule the LEFT lower lobe (series 6, image 104), likely small lymph node. No new lung findings. No evidence active pneumonia. No pleural effusion or pneumothorax. Musculoskeletal: No acute or suspicious osseous finding. Suspected bilateral gynecomastia. Review of the MIP images confirms the above findings.  CTA ABDOMEN AND PELVIS FINDINGS VASCULAR Aorta: Normal caliber aorta without aneurysm, dissection, vasculitis or significant stenosis. Celiac: Patent without evidence of aneurysm, dissection, vasculitis or significant stenosis. SMA:  Patent without evidence of aneurysm, dissection, vasculitis or significant stenosis. Renals: Both renal arteries are patent without evidence of aneurysm, dissection, vasculitis, fibromuscular dysplasia or significant stenosis. IMA: Patent without evidence of aneurysm, dissection, vasculitis or significant stenosis. Inflow: Atherosclerosis at the aortic bifurcation. Normal contrast flow is seen within the bilateral common iliac external iliac arteries. Nearly occlusive thrombosis at the level of the  LEFT common femoral artery. Normal contrast flow seen to the LEFT SFA. Veins: No obvious venous abnormality within the limitations of this arterial phase study. Review of the MIP images confirms the above findings.  NON-VASCULAR Hepatobiliary: No focal liver abnormality is seen. No gallstones, gallbladder wall thickening, or biliary dilatation. Pancreas: Unremarkable. No pancreatic ductal dilatation or surrounding inflammatory changes. Spleen: Normal in size without focal abnormality. Adrenals/Urinary Tract: Adrenal glands are unremarkable. Hypodense masslike lesion within the lower pole of the LEFT kidney, medial cortex, measuring approximately 2.3 x 1.6 cm, mass versus confluent edema. RIGHT kidney appears normal without mass, stone hydronephrosis. No LEFT-sided hydronephrosis. No ureteral or bladder calculi are identified. Bladder is unremarkable. Stomach/Bowel: No dilated large or small bowel loops. No evidence of bowel wall inflammation. Appendix is normal. Is unremarkable, partially decompressed. Lymphatic: No enlarged lymph nodes appreciated within the abdomen or pelvis. Reproductive: Prostate gland is mildly prominent causing slight mass effect on the bladder base. Other: No free fluid or abscess collection is seen. No free intraperitoneal air. Musculoskeletal: No acute or suspicious osseous finding.  CT ANGIOGRAM RUNOFF FINDINGS Right Lower Extremity: Focal nearly occlusive thrombosis at the junction of the RIGHT SFA and deep profundus artery, but flow is present throughout the RIGHT SFA. Fairly extensive atherosclerosis of the RIGHT SFA. Normal contrast flow is seen at the level of the upper RIGHT popliteal artery. There is no contrast flow seen within the lower RIGHT popliteal artery or RIGHT calf arteries. Heavy atherosclerotic changes throughout the RIGHT calf arteries. Left Lower Extremity: As above, nearly occlusive thrombosis at the level of the LEFT common  femoral artery extending to the junction of the LEFT SFA and deep profundus artery. Normal contrast flow is present throughout the LEFT SFA. Contrast flow is seen to the level of the upper LEFT popliteal artery. No contrast flow is seen within the lower LEFT popliteal artery or within the LEFT calf arteries. Heavy atherosclerotic changes are seen throughout the LEFT calf arteries. Soft tissues of the lower extremities are unremarkable. Osseous structures of the lower extremities are unremarkable.  Review of the MIP images confirms the above findings. IMPRESSION: 1. Focal nearly occlusive thrombosis at the junction of the RIGHT SFA and deep profundus artery, but normal contrast flow is present throughout the RIGHT SFA. 2. Nearly occlusive thrombosis at the level of the LEFT common femoral artery extending to the junction of the LEFT SFA and deep profundus artery. Normal contrast flow is present throughout the LEFT SFA. 3. No contrast flow is seen within the calf arteries bilaterally (no flow is seen distal to either popliteal artery). Cannot exclude complete occlusions at the level of both popliteal arteries, but as this is symmetric/bilateral, this may be due to contrast bolus timing (delayed/slow blood flow). 4. Heavy calcified atherosclerotic changes throughout the bilateral lower extremities. 5. **An incidental finding of potential clinical significance has been found. Masslike lesion within the lower pole of the LEFT kidney, medial cortex, measuring 2.3 x 1.6 cm. This is highly suspicious for neoplastic mass. Alternatively, but less likely, this could represent edema related to pyelonephritis or infarct. Recommend renal MRI  for further characterization.** 6. Scattered/numerous small lymph nodes within the mediastinum, pathologic by number. These could be reactive or neoplastic in etiology. 7. Resolved bibasilar pneumonia compared to previous chest CT of 11/04/2019. 8.  Chronic pulmonary fibrosis at the bilateral lung bases, moderate in degree.  Critical Value/emergent results, including LEFT renal findings, were called by telephone at the time of interpretation on 02/17/2020 at 4:50 am to provider [facility] , who verbally acknowledged these results.  MRI ABDOMEN WITHOUT AND WITH CONTRAST COMPARISON:  None. FINDINGS: Lower chest: The lung bases are grossly clear. The heart is borderline enlarged. No pleural or pericardial effusion. Hepatobiliary: No hepatic lesions or intrahepatic biliary dilatation. The gallbladder is unremarkable. The portal and hepatic veins are patent. Normal caliber and course of the common bile duct. Pancreas:  No mass, inflammation or ductal dilatation. Spleen:  Normal size. No focal lesions. Adrenals/Urinary Tract: Adrenal glands and right kidney are unremarkable. Small linear band of scarring type change noted in the midpole region of the right kidney. The left kidney demonstrates patchy decreased perfusion involving the medial cortex at the midpole lower pole junction region. Another smaller area of decreased perfusion is noted in the upper pole posteriorly. This is less evident on the more delayed post-contrast images and is most likely an area of pyelonephritis. I do not see a discrete mass or evidence of an infiltrating neoplasm. No hydronephrosis. Stomach/Bowel: The stomach and duodenum are unremarkable. There are moderately thickened small bowel loops in the left upper quadrant along with some fluid-filled slightly distended small bowel loops. The visualized colon is grossly normal. Vascular/Lymphatic: The aorta and branch vessels are patent. No mesenteric or retroperitoneal mass or adenopathy. Other:  No ascites or abdominal wall hernia. Musculoskeletal: No significant bony findings.  IMPRESSION: 1. Patchy areas of decreased perfusion involving the left kidney as detailed above. This is most likely  pyelonephritis. No discrete mass or evidence of an infiltrating neoplasm. Recommend correlation with urinalysis. A follow-up CT or MRI after appropriate treatment may be helpful to make sure this completely resolves. 2. Thickened and dilated small bowel loops in the left upper quadrant suggesting an inflammatory or infectious enteritis.  Results/Tests Pending at Time of Discharge: None  Discharge Medications:  Allergies as of 02/22/2020      Reactions   Shrimp [shellfish Allergy] Swelling   tongue      Medication List    STOP taking these medications   isosorbide mononitrate 30 MG 24 hr tablet Commonly known as: IMDUR   quinapril-hydrochlorothiazide 20-25 MG tablet Commonly known as: ACCURETIC     TAKE these medications   apixaban 5 MG Tabs tablet Commonly known as: ELIQUIS Take 1 tablet (5 mg total) by mouth 2 (two) times daily. Notes to patient: **NEW** To prevent clot   ARIPiprazole 5 MG tablet Commonly known as: ABILIFY Take 1 tablet by mouth once daily Notes to patient: To improve mood   aspirin 81 MG EC tablet Take 1 tablet (81 mg total) by mouth daily. Notes to patient: To prevent clot   atorvastatin 40 MG tablet Commonly known as: LIPITOR Take 1 tablet (40 mg total) by mouth at bedtime. What changed:   medication strength  how much to take  when to take this Notes to patient: **DOSE INCREASED** To lower cholesterol   carvedilol 12.5 MG tablet Commonly known as: COREG Take 1 tablet (12.5 mg total) by mouth 2 (two) times daily with a meal. Notes to patient: To lower heart rate and treat heart failure  citalopram 20 MG tablet Commonly known as: CELEXA Take 1 tablet (20 mg total) by mouth 2 (two) times daily. Notes to patient: To improve mood   clopidogrel 75 MG tablet Commonly known as: PLAVIX Take 1 tablet (75 mg total) by mouth daily. Notes to patient: **NEW** To prevent clot   Continuous Glucose Monitor Kit 1 kit by Does not apply route  daily.   empagliflozin 25 MG Tabs tablet Commonly known as: JARDIANCE Take 25 mg by mouth daily. Notes to patient: To lower blood sugar   furosemide 40 MG tablet Commonly known as: Lasix Take 1 tablet (40 mg total) by mouth daily as needed for fluid or edema. Notes to patient: **NEW** To prevent fluid accumulation   glucose blood test strip Commonly known as: ONE TOUCH ULTRA TEST Check sugar once daily in the morning. ICD10 E11.65   insulin glargine 100 UNIT/ML injection Commonly known as: Lantus Inject 0.2 mLs (20 Units total) into the skin at bedtime. What changed:   how much to take  when to take this Notes to patient: To lower blood sugar   metFORMIN 1000 MG tablet Commonly known as: GLUCOPHAGE Take 1,000 mg by mouth 2 (two) times daily. Notes to patient: To lower blood sugar   multivitamin with minerals Tabs tablet Take 1 tablet by mouth daily. Notes to patient: Supplement   nitroGLYCERIN 0.4 MG SL tablet Commonly known as: NITROSTAT Place 1 tablet (0.4 mg total) under the tongue every 5 (five) minutes as needed for chest pain. Notes to patient: For chest pain. Call 911 if not relieved after one dose   ONE TOUCH LANCETS Misc Use to check sugar once daily. ICD10 E11.65   ONE TOUCH ULTRA 2 w/Device Kit Check sugar once daily. ICD10 E11.65   sacubitril-valsartan 24-26 MG Commonly known as: ENTRESTO Take 1 tablet by mouth 2 (two) times daily. Notes to patient: **NEW** To treat heart failure, lowers blood pressure   spironolactone 25 MG tablet Commonly known as: ALDACTONE Take 0.5 tablets (12.5 mg total) by mouth daily. Notes to patient: **NEW** To treat heart failure, lowers blood pressure   traZODone 50 MG tablet Commonly known as: DESYREL TAKE 1 TABLET BY MOUTH AT BEDTIME AS NEEDED FOR SLEEP What changed:   reasons to take this  additional instructions Notes to patient: For sleep   triamcinolone cream 0.1 % Commonly known as: KENALOG Apply 1  application topically 2 (two) times daily. Notes to patient: For rash/itching            Durable Medical Equipment  (From admission, onward)         Start     Ordered   02/22/20 0000  For home use only DME Walker rolling       Question Answer Comment  Walker: With 5 Inch Wheels   Patient needs a walker to treat with the following condition Limb ischemia      02/22/20 1449          Discharge Instructions: Please refer to Patient Instructions section of EMR for full details.  Patient was counseled important signs and symptoms that should prompt return to medical care, changes in medications, dietary instructions, activity restrictions, and follow up appointments.   Follow-Up Appointments:  Follow-up Information    [facility] Follow up.   Specialty: [place] Why: HHPT arranged- they will contact you within 48 hrs of discharge for start of care Contact information: [address] [place] [address] [phone]  [facility] Follow up.   Why: rolling walker and 3n1 arranged- to be delivered to room prior to discharge Contact information: [address] [place] [address] [phone]        Minus Breeding, MD Follow up on 03/12/2020.   Specialty: Cardiology Why: at 2:15 pm with his PA [facility].   Contact information: [address] [place] [address] [place]. Go on 02/26/2020.   Specialty: Family Medicine Why: @1 :50 PM for a hospital follow up Contact information: [address] [id number] [place]              Ezequiel Essex, MD 02/24/2020, 4:30 PM PGY-1, [facility] Upper-Level Resident Addendum  My edits for correction/addition/clarification are added. Please see also any attending notes.    Patriciaann Clan, DO  Family Medicine  PGY-3

## 2020-02-26 ENCOUNTER — Inpatient Hospital Stay: Payer: Medicare Other

## 2020-02-27 ENCOUNTER — Telehealth: Payer: Self-pay

## 2020-02-27 ENCOUNTER — Other Ambulatory Visit (HOSPITAL_COMMUNITY): Payer: Medicare Other

## 2020-02-27 NOTE — Telephone Encounter (Signed)
Patient calls nurse line regarding severe pain following surgical procedure. Patient states that he has not been able to sleep in the last 4-5 days due to pain. Patient states that he did not receive any rx for pain medication at discharge and would like refill of percocet. Informed patient that he missed a hospital follow up yesterday, however, patient states that he is in too much pain and has limited transportation to come into the office.   Could patient be scheduled as virtual visit to complete hospital follow up for pain management.   Forwarding to PCP  Veronda Prude, RN

## 2020-02-27 NOTE — Telephone Encounter (Signed)
Yes, please schedule patient for virtual visit.

## 2020-02-28 ENCOUNTER — Other Ambulatory Visit: Payer: Self-pay

## 2020-02-28 ENCOUNTER — Encounter (HOSPITAL_COMMUNITY): Payer: Self-pay | Admitting: Emergency Medicine

## 2020-02-28 ENCOUNTER — Emergency Department (HOSPITAL_COMMUNITY)
Admission: EM | Admit: 2020-02-28 | Discharge: 2020-02-28 | Disposition: A | Discharge status: Home / Self Care | Payer: Medicare Other | Attending: Emergency Medicine | Admitting: Emergency Medicine

## 2020-02-28 ENCOUNTER — Other Ambulatory Visit: Payer: Self-pay | Admitting: *Deleted

## 2020-02-28 DIAGNOSIS — I5020 Unspecified systolic (congestive) heart failure: Secondary | ICD-10-CM | POA: Insufficient documentation

## 2020-02-28 DIAGNOSIS — Z7901 Long term (current) use of anticoagulants: Secondary | ICD-10-CM | POA: Insufficient documentation

## 2020-02-28 DIAGNOSIS — Z79899 Other long term (current) drug therapy: Secondary | ICD-10-CM | POA: Diagnosis not present

## 2020-02-28 DIAGNOSIS — I110 Hypertensive heart disease with heart failure: Secondary | ICD-10-CM | POA: Diagnosis not present

## 2020-02-28 DIAGNOSIS — E119 Type 2 diabetes mellitus without complications: Secondary | ICD-10-CM | POA: Insufficient documentation

## 2020-02-28 DIAGNOSIS — I252 Old myocardial infarction: Secondary | ICD-10-CM | POA: Insufficient documentation

## 2020-02-28 DIAGNOSIS — I2510 Atherosclerotic heart disease of native coronary artery without angina pectoris: Secondary | ICD-10-CM | POA: Insufficient documentation

## 2020-02-28 DIAGNOSIS — Z7984 Long term (current) use of oral hypoglycemic drugs: Secondary | ICD-10-CM | POA: Diagnosis not present

## 2020-02-28 DIAGNOSIS — M79605 Pain in left leg: Secondary | ICD-10-CM | POA: Insufficient documentation

## 2020-02-28 DIAGNOSIS — Z955 Presence of coronary angioplasty implant and graft: Secondary | ICD-10-CM | POA: Insufficient documentation

## 2020-02-28 DIAGNOSIS — R209 Unspecified disturbances of skin sensation: Secondary | ICD-10-CM

## 2020-02-28 DIAGNOSIS — Z7982 Long term (current) use of aspirin: Secondary | ICD-10-CM | POA: Diagnosis not present

## 2020-02-28 MED ORDER — OXYCODONE-ACETAMINOPHEN 5-325 MG PO TABS
1.0000 | ORAL_TABLET | Freq: Once | ORAL | Status: AC
  Administered 2020-02-28: 1 via ORAL
  Filled 2020-02-28 (×2): qty 1

## 2020-02-28 MED ORDER — OXYCODONE-ACETAMINOPHEN 5-325 MG PO TABS: 1.0000 | ORAL_TABLET | Freq: Four times a day (QID) | ORAL | 0 refills | Status: DC | PRN

## 2020-02-28 NOTE — ED Provider Notes (Signed)
[facility] EMERGENCY DEPARTMENT Provider Note   CSN: [id number] Arrival date & time: 02/28/20  0732     History No chief complaint on file.   Paul Alquan Morrish. is a 49 y.o. male.  HPI   Patient presents emergency department with chief complaint of worsening leg pain since his femoral recatheterization and fasciotomy done on the left leg on 07/10.  Patient explains the pain is generally on the medial side of his knee and does not radiate.  He describes the pain as a sharp constant pain denies aggravating or alleviating factors.  He states he been taking his tramadol as prescribed as well as Tylenol which only dulls the pain.  He denies any fever, chills, purulent discharge, drainage, redness around his leg, paresthesias.  Patient has significant medical history of anxiety, bipolar, diabetes, hypertension, CAD.  Patient denies headache, fever, chills, shortness of breath, chest pain, nausea, vomiting, dysuria, paresthesias in legs.  Past Medical History:  Diagnosis Date  . Anxiety   . Bipolar disorder ([place])   . CAD (coronary artery disease)    Anterior MI 2007 with DES stenting of an occluded LAD, distal 70% stenosis, OM 60% stenosis, circumflex 90% stenosis treated with a drug-eluting stent, PA 90% stenosis treated medically.  Cath January 2013 70-80% diagonal stenosis in a small vessel, OM 50% stenosis, PDA long 75% stenosis. EF 45%. He was managed medically.  . Cardiomyopathy, ischemic    EF 45%  . Diabetes mellitus   . History of chest pain 11/25/2010  . Hyperlipidemia   . Hypertension   . Myocardial infarction ([place])   . PTSD (post-traumatic stress disorder)   . Tuberculosis    1989, took pills for it     Patient Active Problem List   Diagnosis Date Noted  . Systolic congestive heart failure ([place])   . Renal mass   . Sensation of cold in leg 02/17/2020  . Critical lower limb ischemia 02/17/2020  . Type 2 diabetes mellitus with complication, with  long-term current use of insulin ([place]) 02/08/2020  . Pierced lip infection 01/16/2020  . Shortness of breath 12/31/2019  . Hospital discharge follow-up 11/08/2019  . Pneumonia due to COVID-19 virus 11/04/2019  . Neck mass 07/14/2019  . Hypotension 02/20/2019  . PTSD (post-traumatic stress disorder) 12/01/2018  . Cardiomyopathy ([place]) 01/14/2018  . Diabetic neuropathy ([place]) 01/10/2018  . Nausea alone 02/28/2013  . Cold sensation of skin 07/29/2012  . Right hand pain 05/03/2012  . Hypertension   . CAD (coronary artery disease)   . Hyperlipidemia LDL goal <100   . Stented coronary artery 04/30/2011  . Atopic dermatitis 08/19/2010  . Anxiety state 09/27/2009  . Diabetes mellitus type 2, uncontrolled ([place]) 09/19/2009    Past Surgical History:  Procedure Laterality Date  . APPLICATION OF WOUND VAC Left 02/17/2020   Procedure: APPLICATION OF WOUND VAC;  Surgeon: Serafina Mitchell, MD;  Location: [facility];  Service: Vascular;  Laterality: Left;  . Catherization  08/25/11   Dr. Percival Spanish  . CORONARY ANGIOPLASTY WITH STENT PLACEMENT  207, 2011   4 stents placed  . DIRECT LARYNGOSCOPY Left 08/14/2019   Procedure: DIRECT LARYNGOSCOPY;  Surgeon: Rozetta Nunnery, MD;  Location: [facility];  Service: ENT;  Laterality: Left;  . FASCIOTOMY Left 02/17/2020   Procedure: Four Compartment FASCIOTOMY;  Surgeon: Serafina Mitchell, MD;  Location: [facility];  Service: Vascular;  Laterality: Left;  . FASCIOTOMY CLOSURE Left 02/17/2020   Procedure: FASCIOTOMY CLOSURE;  Surgeon: Harold Barban  W, MD;  Location: [facility];  Service: Vascular;  Laterality: Left;  . LEFT HEART CATHETERIZATION WITH CORONARY ANGIOGRAM N/A 08/25/2011   Procedure: LEFT HEART CATHETERIZATION WITH CORONARY ANGIOGRAM;  Surgeon: Minus Breeding, MD;  Location[facility] C[facility] LAB;  Service: Cardiovascular;  Laterality: N/A;  . LOWER EXTREMITY ANGIOGRAM Bilateral 02/17/2020   Procedure: LOWER EXTREMITY ANGIOGRAM;  Surgeon: Serafina Mitchell, MD;  Location: [facility] OR;   Service: Vascular;  Laterality: Bilateral;  . LOWER EXTREMITY ANGIOGRAM Left 02/17/2020   Procedure: LOWER EXTREMITY ANGIOGRAM WITH Stenting Left Popliteal Artery, Angioplasty posterior Tibial Artery;  Surgeon: Serafina Mitchell, MD;  Location: [facility];  Service: Vascular;  Laterality: Left;  Marland Kitchen MASS BIOPSY Left 08/14/2019   Procedure: BIOPSY EXCISION OF LEFT NECK MASS;  Surgeon: Rozetta Nunnery, MD;  Location: [facility];  Service: ENT;  Laterality: Left;  . THROMBECTOMY FEMORAL ARTERY Bilateral 02/17/2020   Procedure: Bilateral  Iliofemoral Thrombectomy and exposure of left lower leg.;  Surgeon: Serafina Mitchell, MD;  Location: [facility] OR;  Service: Vascular;  Laterality: Bilateral;       Family History  Problem Relation Age of Onset  . Hypertension Mother   . Hypertension Father   . Diabetes Maternal Aunt   . Diabetes Maternal Uncle   . Cancer Maternal Grandmother     Social History   Tobacco Use  . Smoking status: Never Smoker  . Smokeless tobacco: Never Used  Substance Use Topics  . Alcohol use: No  . Drug use: No    Home Medications Prior to Admission medications   Medication Sig Start Date End Date Taking? Authorizing Provider  apixaban (ELIQUIS) 5 MG TABS tablet Take 1 tablet (5 mg total) by mouth 2 (two) times daily. 02/22/20   Autry-Lott, Naaman Plummer, DO  ARIPiprazole (ABILIFY) 5 MG tablet Take 1 tablet by mouth once daily Patient taking differently: Take 5 mg by mouth daily.  01/09/20   Nuala Alpha, DO  aspirin 81 MG EC tablet Take 1 tablet (81 mg total) by mouth daily. 11/06/19   Lattie Haw, MD  atorvastatin (LIPITOR) 40 MG tablet Take 1 tablet (40 mg total) by mouth at bedtime. 02/22/20   Autry-Lott, Naaman Plummer, DO  Blood Glucose Monitoring Suppl (ONE TOUCH ULTRA 2) W/DEVICE KIT Check sugar once daily. ICD10 E11.65 01/24/15   Leone Brand, MD  carvedilol (COREG) 12.5 MG tablet Take 1 tablet (12.5 mg total) by mouth 2 (two) times daily with a meal. 11/06/19   Lattie Haw, MD    citalopram (CELEXA) 20 MG tablet Take 1 tablet (20 mg total) by mouth 2 (two) times daily. 10/26/19   Nuala Alpha, DO  clopidogrel (PLAVIX) 75 MG tablet Take 1 tablet (75 mg total) by mouth daily. 02/23/20   Autry-Lott, Naaman Plummer, DO  Continuous Glucose Monitor KIT 1 kit by Does not apply route daily. 02/20/19   Caroline More, DO  empagliflozin (JARDIANCE) 25 MG TABS tablet Take 25 mg by mouth daily. 12/01/19   Nuala Alpha, DO  furosemide (LASIX) 40 MG tablet Take 1 tablet (40 mg total) by mouth daily as needed for fluid or edema. 02/22/20 03/23/20  Autry-Lott, Naaman Plummer, DO  glucose blood (ONE TOUCH ULTRA TEST) test strip Check sugar once daily in the morning. ICD10 E11.65 01/10/18   Mikell, Jeani Sow, MD  insulin glargine (LANTUS) 100 UNIT/ML injection Inject 0.2 mLs (20 Units total) into the skin at bedtime. Patient taking differently: Inject 15 Units into the skin daily.  12/01/19   Nuala Alpha, DO  metFORMIN (GLUCOPHAGE) 1000 MG tablet Take 1,000 mg by mouth 2 (two) times daily. 11/21/19   [provider]  Multiple Vitamin (MULTIVITAMIN WITH MINERALS) TABS tablet Take 1 tablet by mouth daily.    [provider]  nitroGLYCERIN (NITROSTAT) 0.4 MG SL tablet Place 1 tablet (0.4 mg total) under the tongue every 5 (five) minutes as needed for chest pain. 10/07/19   Nuala Alpha, DO  ONE TOUCH LANCETS MISC Use to check sugar once daily. ICD10 E11.65 01/09/15   Willeen Niece, MD  oxyCODONE-acetaminophen (PERCOCET/ROXICET) 5-325 MG tablet Take 1 tablet by mouth every 6 (six) hours as needed for severe pain. 02/28/20   Marcello Fennel, PA-C  sacubitril-valsartan (ENTRESTO) 24-26 MG Take 1 tablet by mouth 2 (two) times daily. 02/22/20   Autry-Lott, Naaman Plummer, DO  spironolactone (ALDACTONE) 25 MG tablet Take 0.5 tablets (12.5 mg total) by mouth daily. 02/23/20   Autry-Lott, Naaman Plummer, DO  traZODone (DESYREL) 50 MG tablet TAKE 1 TABLET BY MOUTH AT BEDTIME AS NEEDED FOR SLEEP Patient  taking differently: Take 50 mg by mouth at bedtime as needed for sleep.  01/09/20   Nuala Alpha, DO  triamcinolone cream (KENALOG) 0.1 % Apply 1 application topically 2 (two) times daily. 08/01/19   Sherene Sires, DO    Allergies    Shrimp [shellfish allergy]  Review of Systems   Review of Systems  Constitutional: Negative for chills and fever.  HENT: Negative for congestion, sneezing and sore throat.   Eyes: Negative for pain.  Respiratory: Negative for cough and shortness of breath.   Cardiovascular: Positive for leg swelling. Negative for chest pain and palpitations.  Gastrointestinal: Negative for abdominal pain, diarrhea, nausea and vomiting.  Genitourinary: Negative for enuresis and flank pain.  Musculoskeletal: Negative for back pain and myalgias.       Patient admits to left leg pain.  Skin: Negative for rash.  Neurological: Negative for dizziness and headaches.  Hematological: Does not bruise/bleed easily.    Physical Exam Updated Vital Signs BP 115/85   Pulse 82   Temp 98.3 F (36.8 C) (Oral)   Resp 16   Ht _0  (1.803 m)   Wt 71.6 kg   SpO2 99%   BMI 22.02 kg/m   Physical Exam Vitals and nursing note reviewed.  Constitutional:      General: He is not in acute distress.    Appearance: He is not ill-appearing.  HENT:     Head: Normocephalic and atraumatic.     Nose: No congestion.     Mouth/Throat:     Mouth: Mucous membranes are moist.     Pharynx: Oropharynx is clear.  Eyes:     General: No scleral icterus. Cardiovascular:     Rate and Rhythm: Normal rate and regular rhythm.     Pulses: Normal pulses.     Heart sounds: No murmur heard.  No friction rub. No gallop.   Pulmonary:     Effort: No respiratory distress.     Breath sounds: No wheezing, rhonchi or rales.  Abdominal:     General: There is no distension.     Tenderness: There is no abdominal tenderness. There is no guarding.     Comments: Patient had 2 surgical scars in the inguinal  region.  Wounds appear to be healing nicely, no discharge, drainage, erythema, edema noted.  Musculoskeletal:        General: No swelling.     Comments: Patient's left leg was visualized, 2+ edema up to  the shins.,  2 surgical scars seen on the lateral and medial side of the lower leg.  There was no erythema, purulent discharge, was not warm to the touch, wound appear to be healing nicely.  Pedal pulses were felt, good capillary refill, sensory fully intact.  Skin:    General: Skin is warm and dry.     Capillary Refill: Capillary refill takes less than 2 seconds.     Findings: No rash.  Neurological:     Mental Status: He is alert.  Psychiatric:        Mood and Affect: Mood normal.     ED Results / Procedures / Treatments   Labs (all labs ordered are listed, but only abnormal results are displayed) Labs Reviewed - No data to display  EKG None  Radiology No results found.  Procedures Procedures (including critical care time)  Medications Ordered in ED Medications  oxyCODONE-acetaminophen (PERCOCET/ROXICET) 5-325 MG per tablet 1 tablet (1 tablet Oral Given 02/28/20 0817)    ED Course  I have reviewed the triage vital signs and the nursing notes.  Pertinent labs & imaging results that were available during my care of the patient were reviewed by me and considered in my medical decision making (see chart for details).    MDM Rules/Calculators/A&P                          I have personally reviewed all imaging, labs and have interpreted them.  Unlikely patient suffering from an infection from his wound as vital signs reassuring, patient is nontoxic-appearing, physical exam showed surgical wounds that appear to be healing well, no erythema, purulent discharge, induration or fluctuant felt.  Unlikely patient suffering from the clot in his peripheral arteries as pedal pulses were felt bilaterally,, good capillary refill, Doppler was done and could hear pulses bilaterally,  sensation was fully intact, patient had good range of motion in his hips knees ankles.  Unlikely patient suffering from a DVT as he is on Eliquis, and antiplatelet medications.  Likely patient's swelling in left leg is secondary to lymphedema from surgery.  Due to patient's increased risk of CAD and DVT spoke with Dr. Oneida Alar of vascular who recommends the patient is follow-up outpatient tomorrow for a Doppler.  Due to patient's nontoxic appearing, stable vital signs, reassuring physical exam further lab work and imaging were not indicated at this time.  Patient appears to be resting comfortably in bed showing no acute signs stress.  Vital signs remained stable does not meet criteria to be admitted to the hospital.  Likely patient suffering from pain from surgery and will give him some pain meds and have him follow-up with Dr. Oneida Alar tomorrow for further evaluation management.  Patient was discussed with attending who evaluated the patient and agrees with assessment and plan.  Patient was given at home care as well as strict return precautions.  Patient verbalized that he understood and agreed with said plan. Final Clinical Impression(s) / ED Diagnoses Final diagnoses:  Pain in left leg    Rx / DC Orders ED Discharge Orders         Ordered    oxyCODONE-acetaminophen (PERCOCET/ROXICET) 5-325 MG tablet  Every 6 hours PRN     Discontinue  Reprint     02/28/20 1314           Aron Baba 02/28/20 1419    Gareth Morgan, MD 02/28/20 2131

## 2020-02-28 NOTE — Telephone Encounter (Signed)
Called patient and LVM to return call to office to schedule virtual visit. Of note, patient was seen in the ED this morning and received percocet rx.   Veronda Prude, RN

## 2020-02-28 NOTE — ED Triage Notes (Signed)
Pt here with c/o left lag pain after surgery on his leg , pt states that he needs something more for pain , pt missed his follow up appointment

## 2020-02-28 NOTE — Discharge Instructions (Addendum)
You have been seen here for left leg pain.  Vital signs and physical exam look reassuring.  I will prescribed you pain meds please take as prescribed.  Do not operate heavy machinery or drink alcohol while on this medication as it can make you drowsy.  I recommend that you continue to take the medications that were prescribed to you. I also recommend that you keep your leg elevated as this can help with swelling and inflammation.  I have spoken with Dr. Darrick Penna who wants to see you tomorrow for a ultrasound of your leg to ensure the vessels in your legs are open.  I strongly recommend that you keep this appointment appointment tomorrow  I want you to come back to the emergency department if you develop increasing leg swelling, numbness or tingling in your legs, increasing pain redness or rash around the surgical sites.  If you develop chest pain, shortness of breath, fever, chills, uncontrolled nausea, vomiting, diarrhea as the symptoms require further evaluation management.

## 2020-02-29 ENCOUNTER — Ambulatory Visit (INDEPENDENT_AMBULATORY_CARE_PROVIDER_SITE_OTHER)
Admission: RE | Admit: 2020-02-29 | Discharge: 2020-02-29 | Disposition: A | Discharge status: Home / Self Care | Payer: Medicare Other | Source: Ambulatory Visit | Attending: Vascular Surgery | Admitting: Vascular Surgery

## 2020-02-29 ENCOUNTER — Ambulatory Visit (HOSPITAL_COMMUNITY)
Admission: RE | Admit: 2020-02-29 | Discharge: 2020-02-29 | Disposition: A | Discharge status: Home / Self Care | Payer: Medicare Other | Source: Ambulatory Visit | Attending: Vascular Surgery | Admitting: Vascular Surgery

## 2020-02-29 ENCOUNTER — Other Ambulatory Visit (HOSPITAL_COMMUNITY): Payer: Self-pay | Admitting: Vascular Surgery

## 2020-02-29 ENCOUNTER — Other Ambulatory Visit: Payer: Self-pay | Admitting: Vascular Surgery

## 2020-02-29 ENCOUNTER — Ambulatory Visit (INDEPENDENT_AMBULATORY_CARE_PROVIDER_SITE_OTHER): Payer: Self-pay | Admitting: Vascular Surgery

## 2020-02-29 ENCOUNTER — Other Ambulatory Visit: Payer: Self-pay | Admitting: *Deleted

## 2020-02-29 ENCOUNTER — Encounter: Payer: Self-pay | Admitting: Vascular Surgery

## 2020-02-29 VITALS — BP 129/92 | HR 73 | Temp 98.2°F | Resp 20 | Ht 71.0 in | Wt 151.0 lb

## 2020-02-29 DIAGNOSIS — I70229 Atherosclerosis of native arteries of extremities with rest pain, unspecified extremity: Secondary | ICD-10-CM

## 2020-02-29 DIAGNOSIS — I998 Other disorder of circulatory system: Secondary | ICD-10-CM | POA: Diagnosis not present

## 2020-02-29 DIAGNOSIS — R600 Localized edema: Secondary | ICD-10-CM | POA: Diagnosis not present

## 2020-02-29 DIAGNOSIS — R209 Unspecified disturbances of skin sensation: Secondary | ICD-10-CM | POA: Diagnosis not present

## 2020-02-29 DIAGNOSIS — I739 Peripheral vascular disease, unspecified: Secondary | ICD-10-CM

## 2020-02-29 NOTE — Progress Notes (Signed)
Patient is a patient is a 49 year old male who is status post bilateral femoral endarterectomy and stenting of the left popliteal artery with angioplasty of the posterior tibial and tibioperoneal trunk by Dr. Myra Gianotti on February 17, 2020.  He was concerned because he was developing swelling in his left leg.  He does not have any rest pain.  He has a little bit of serous drainage from his groin and his medial fasciotomy incision.  Otherwise he is doing well.  He was seen and evaluated in the ER at [facility] yesterday.  He is still walking with a walker but overall pain is improving.  Physical exam:  Vitals:   02/29/20 1546  BP: (!) 129/92  Pulse: 73  Resp: 20  Temp: 98.2 F (36.8 C)  SpO2: 98%  Weight: 151 lb (68.5 kg)  Height: 5\' 11"  (1.803 m)    Extremities: Left foot pink warm well perfused fasciotomy incisions intact with staples in place small amount of serous crust on the medial aspect similar findings at the groin incisions, 2+ edema from the knee into the foot left leg  Vascular: No palpable pedal pulses but foot is warm and well-perfused.  Data: Patient had bilateral ABIs performed today which were 1.3 on the left 0.64 on the right waveforms were diminished triphasic.  The velocities were slightly slow suggestive of possible inflow stenosis.  Velocities were 35-40 in the proximal stent.  Patient also had a DVT ultrasound today due to the edema in his leg.  This showed no evidence of DVT but enlarged lymph nodes.  Assessment: 1.  Left leg swelling secondary to inflammatory reaction from ischemia fasciotomies no evidence of DVT  2.  Patent left leg stent and femoral endarterectomies with possible proximal narrowing of the left leg stent  Plan:  1.  Staples were left in place today due to the left leg swelling.  He has close follow-up scheduled with Dr. in a few weeks.  Will reassess at that time.  Patient was placed in an Ace wrap today and encouraged to elevate his leg  2.   Decreased velocities proximal left leg stent we will leave it Dr. Myra Gianotti discretion whether or not to further evaluate this.  Estanislado Spire, MD [facility] Office: [phone]

## 2020-03-06 ENCOUNTER — Encounter: Payer: Self-pay | Admitting: *Deleted

## 2020-03-06 ENCOUNTER — Telehealth: Payer: Self-pay | Admitting: *Deleted

## 2020-03-06 NOTE — Telephone Encounter (Signed)
Patient enrolled for [employer] to ship 30 day cardiac event monitor to [address], [place], [place], [place] [address].  Letter with instructions to be mailed to patient.

## 2020-03-11 ENCOUNTER — Encounter: Payer: Medicare Other | Admitting: Surgery

## 2020-03-11 ENCOUNTER — Other Ambulatory Visit: Payer: Self-pay

## 2020-03-11 ENCOUNTER — Ambulatory Visit (INDEPENDENT_AMBULATORY_CARE_PROVIDER_SITE_OTHER): Payer: Self-pay | Admitting: Physician Assistant

## 2020-03-11 VITALS — BP 100/69 | HR 82 | Temp 98.0°F | Resp 20 | Ht 71.0 in | Wt 146.1 lb

## 2020-03-11 DIAGNOSIS — I998 Other disorder of circulatory system: Secondary | ICD-10-CM

## 2020-03-11 DIAGNOSIS — I70229 Atherosclerosis of native arteries of extremities with rest pain, unspecified extremity: Secondary | ICD-10-CM

## 2020-03-11 NOTE — Progress Notes (Signed)
Office Note     CC:  follow up Requesting Provider:  Cleophas Dunker, *  HPI: Paul [name]. is a 49 y.o. (12-Jul-1971) male who  is status post bilateral femoral endarterectomy and stenting of the left popliteal artery with angioplasty of the posterior tibial and tibioperoneal trunk by Dr. Trula Slade on February 17, 2020  He ambulates with a rolling walker and denies claudication or rest pain.  He states he has some residual decreased motor function of his left toes.  He denies fever or chills.  He has been keeping his incisions dry.  At his last visit he complained of left lower extremity edema and his leg was placed in an Ace wrap.  He wears this in today.  Procedure:   #1: Bilateral femoral artery exposure with bilateral iliofemoral thrombectomy                         #2: Bilateral lower extremity arteriogram                         #3: Open exposure of the left below-knee popliteal artery and subsequent retrograde thrombectomy                         #4: 4 compartment left leg fasciotomy                         #5: Left leg wound VAC to the fasciotomy site  Procedure:   #1:  Redo exposure of left femoral artery                         #2:  Stent left popliteal artery                         #3:  Angioplasty left posterior tibial artery and tibioperoneal trunk                         #4:  Intra-arterial administration of nitroglycerin                         #5:  Closure of left leg fasciotomy  He is compliant with his aspirin, statin and Plavix.   Past Medical History:  Diagnosis Date  . Anxiety   . Bipolar disorder ([place])   . CAD (coronary artery disease)    Anterior MI 2007 with DES stenting of an occluded LAD, distal 70% stenosis, OM 60% stenosis, circumflex 90% stenosis treated with a drug-eluting stent, PA 90% stenosis treated medically.  Cath January 2013 70-80% diagonal stenosis in a small vessel, OM 50% stenosis, PDA long 75% stenosis. EF 45%. He was managed  medically.  . Cardiomyopathy, ischemic    EF 45%  . Diabetes mellitus   . History of chest pain 11/25/2010  . Hyperlipidemia   . Hypertension   . Myocardial infarction ([place])   . PTSD (post-traumatic stress disorder)   . Tuberculosis    1989, took pills for it     Past Surgical History:  Procedure Laterality Date  . APPLICATION OF WOUND VAC Left 02/17/2020   Procedure: APPLICATION OF WOUND VAC;  Surgeon: Serafina Mitchell, MD;  Location: [place];  Service: Vascular;  Laterality: Left;  . Catherization  08/25/11   Dr.  Hochrein  . CORONARY ANGIOPLASTY WITH STENT PLACEMENT  207, 2011   4 stents placed  . DIRECT LARYNGOSCOPY Left 08/14/2019   Procedure: DIRECT LARYNGOSCOPY;  Surgeon: Rozetta Nunnery, MD;  Location: [place];  Service: ENT;  Laterality: Left;  . FASCIOTOMY Left 02/17/2020   Procedure: Four Compartment FASCIOTOMY;  Surgeon: Serafina Mitchell, MD;  Location: [place];  Service: Vascular;  Laterality: Left;  . FASCIOTOMY CLOSURE Left 02/17/2020   Procedure: FASCIOTOMY CLOSURE;  Surgeon: Serafina Mitchell, MD;  Location: [place];  Service: Vascular;  Laterality: Left;  . LEFT HEART CATHETERIZATION WITH CORONARY ANGIOGRAM N/A 08/25/2011   Procedure: LEFT HEART CATHETERIZATION WITH CORONARY ANGIOGRAM;  Surgeon: Minus Breeding, MD;  Location: [facility] CATH LAB;  Service: Cardiovascular;  Laterality: N/A;  . LOWER EXTREMITY ANGIOGRAM Bilateral 02/17/2020   Procedure: LOWER EXTREMITY ANGIOGRAM;  Surgeon: Serafina Mitchell, MD;  Location: [facility] OR;  Service: Vascular;  Laterality: Bilateral;  . LOWER EXTREMITY ANGIOGRAM Left 02/17/2020   Procedure: LOWER EXTREMITY ANGIOGRAM WITH Stenting Left Popliteal Artery, Angioplasty posterior Tibial Artery;  Surgeon: Serafina Mitchell, MD;  Location: [place];  Service: Vascular;  Laterality: Left;  Marland Kitchen MASS BIOPSY Left 08/14/2019   Procedure: BIOPSY EXCISION OF LEFT NECK MASS;  Surgeon: Rozetta Nunnery, MD;  Location: [place];  Service: ENT;  Laterality: Left;  .  THROMBECTOMY FEMORAL ARTERY Bilateral 02/17/2020   Procedure: Bilateral  Iliofemoral Thrombectomy and exposure of left lower leg.;  Surgeon: Serafina Mitchell, MD;  Location: [facility] OR;  Service: Vascular;  Laterality: Bilateral;    Social History   Socioeconomic History  . Marital status: Single    Spouse name: Not on file  . Number of children: Not on file  . Years of education: Not on file  . Highest education level: Not on file  Occupational History  . Not on file  Tobacco Use  . Smoking status: Never Smoker  . Smokeless tobacco: Never Used  Vaping Use  . Vaping Use: Never used  Substance and Sexual Activity  . Alcohol use: No  . Drug use: No  . Sexual activity: Not on file  Other Topics Concern  . Not on file  Social History Narrative  . Not on file   Social Determinants of Health   Financial Resource Strain:   . Difficulty of Paying Living Expenses:   Food Insecurity:   . Worried About Charity fundraiser in the Last Year:   . Arboriculturist in the Last Year:   Transportation Needs:   . Film/video editor (Medical):   Marland Kitchen Lack of Transportation (Non-Medical):   Physical Activity:   . Days of Exercise per Week:   . Minutes of Exercise per Session:   Stress:   . Feeling of Stress :   Social Connections:   . Frequency of Communication with Friends and Family:   . Frequency of Social Gatherings with Friends and Family:   . Attends Religious Services:   . Active Member of Clubs or Organizations:   . Attends Archivist Meetings:   Marland Kitchen Marital Status:   Intimate Partner Violence:   . Fear of Current or Ex-Partner:   . Emotionally Abused:   Marland Kitchen Physically Abused:   . Sexually Abused:    Family History  Problem Relation Age of Onset  . Hypertension Mother   . Hypertension Father   . Diabetes Maternal Aunt   . Diabetes Maternal Uncle   . Cancer Maternal Grandmother  Current Outpatient Medications  Medication Sig Dispense Refill  . apixaban  (ELIQUIS) 5 MG TABS tablet Take 1 tablet (5 mg total) by mouth 2 (two) times daily. 60 tablet 0  . ARIPiprazole (ABILIFY) 5 MG tablet Take 1 tablet by mouth once daily (Patient taking differently: Take 5 mg by mouth daily. ) 30 tablet 0  . aspirin 81 MG EC tablet Take 1 tablet (81 mg total) by mouth daily. 90 tablet 0  . atorvastatin (LIPITOR) 40 MG tablet Take 1 tablet (40 mg total) by mouth at bedtime. 30 tablet 0  . Blood Glucose Monitoring Suppl (ONE TOUCH ULTRA 2) W/DEVICE KIT Check sugar once daily. ICD10 E11.65 1 each 0  . carvedilol (COREG) 12.5 MG tablet Take 1 tablet (12.5 mg total) by mouth 2 (two) times daily with a meal. 60 tablet 0  . citalopram (CELEXA) 20 MG tablet Take 1 tablet (20 mg total) by mouth 2 (two) times daily. 180 tablet 3  . clopidogrel (PLAVIX) 75 MG tablet Take 1 tablet (75 mg total) by mouth daily. 30 tablet 0  . Continuous Glucose Monitor KIT 1 kit by Does not apply route daily. 1 kit 0  . empagliflozin (JARDIANCE) 25 MG TABS tablet Take 25 mg by mouth daily. 90 tablet 3  . furosemide (LASIX) 40 MG tablet Take 1 tablet (40 mg total) by mouth daily as needed for fluid or edema. 30 tablet 0  . glucose blood (ONE TOUCH ULTRA TEST) test strip Check sugar once daily in the morning. ICD10 E11.65 30 each 5  . insulin glargine (LANTUS) 100 UNIT/ML injection Inject 0.2 mLs (20 Units total) into the skin at bedtime. (Patient taking differently: Inject 15 Units into the skin daily. ) 10 mL 11  . metFORMIN (GLUCOPHAGE) 1000 MG tablet Take 1,000 mg by mouth 2 (two) times daily.    . Multiple Vitamin (MULTIVITAMIN WITH MINERALS) TABS tablet Take 1 tablet by mouth daily.    . nitroGLYCERIN (NITROSTAT) 0.4 MG SL tablet Place 1 tablet (0.4 mg total) under the tongue every 5 (five) minutes as needed for chest pain. 30 tablet 1  . ONE TOUCH LANCETS MISC Use to check sugar once daily. ICD10 E11.65 30 each 5  . oxyCODONE-acetaminophen (PERCOCET/ROXICET) 5-325 MG tablet Take 1 tablet by  mouth every 6 (six) hours as needed for severe pain. 6 tablet 0  . sacubitril-valsartan (ENTRESTO) 24-26 MG Take 1 tablet by mouth 2 (two) times daily. 60 tablet 0  . spironolactone (ALDACTONE) 25 MG tablet Take 0.5 tablets (12.5 mg total) by mouth daily. 30 tablet 0  . traZODone (DESYREL) 50 MG tablet TAKE 1 TABLET BY MOUTH AT BEDTIME AS NEEDED FOR SLEEP (Patient taking differently: Take 50 mg by mouth at bedtime as needed for sleep. ) 30 tablet 0  . triamcinolone cream (KENALOG) 0.1 % Apply 1 application topically 2 (two) times daily. 453.6 g 0   No current facility-administered medications for this visit.    Allergies  Allergen Reactions  . Shrimp [Shellfish Allergy] Swelling    tongue     REVIEW OF SYSTEMS:   _0  denotes positive finding, _1  denotes negative finding Cardiac  Comments:  Chest pain or chest pressure:    Shortness of breath upon exertion:    Short of breath when lying flat:    Irregular heart rhythm:        Vascular    Pain in calf, thigh, or hip brought on by ambulation:    Pain in feet at  night that wakes you up from your sleep:     Blood clot in your veins:    Leg swelling:         Pulmonary    Oxygen at home:    Productive cough:     Wheezing:         Neurologic    Sudden weakness in arms or legs:     Sudden numbness in arms or legs:     Sudden onset of difficulty speaking or slurred speech:    Temporary loss of vision in one eye:     Problems with dizziness:         Gastrointestinal    Blood in stool:     Vomited blood:         Genitourinary    Burning when urinating:     Blood in urine:        Psychiatric    Major depression:         Hematologic    Bleeding problems:    Problems with blood clotting too easily:        Skin    Rashes or ulcers:        Constitutional    Fever or chills:      PHYSICAL EXAMINATION:  Vitals:   03/11/20 0919  BP: 100/69  Pulse: 82  Resp: 20  Temp: 98 F (36.7 C)  SpO2: 97%   General:  WDWN  in NAD; vital signs documented above Gait: Not observed HENT: WNL, normocephalic Pulmonary: normal non-labored breathing Cardiac: regular HR, Skin: without rashes Vascular Exam/Pulses: 2+ femoral pulses bilaterally. Brisk left PT Doppler signal, dampened signal of left peroneal. Dampened PT signal on the right. Extremities: without ischemic changes, without Gangrene , without cellulitis; without open wounds;  Both groin incisions ar well approximated without drainage. His left lower extremity fasciotomy incisions are inspected.  The lateral incision is well approximated and healed.  There is slight skin edge separation of the medial incision and I am able to express a small amount of old blood.  There is no purulence or tenderness or erythema. Musculoskeletal: no muscle wasting or atrophy  Neurologic: A&O X 3;  No focal weakness or paresthesias are detected Psychiatric:  The pt has Normal affect.   Non-Invasive Vascular Imaging:   None today  ASSESSMENT/PLAN:: 49 y.o. male here for follow up for bilateral iliofemoral thrombectomy, retrograde left popliteal thrombectomy, 4 compartment fasciotomy left lower leg, left popliteal artery stent placement and angioplasty left posterior tibial and TP trunk.  The patient is stable postoperatively.  He has brisk Doppler signal of the left PT.  Staples removed from lateral lower leg incision.  Left lower leg medial incision draining old blood.  Staples are left in place.  Clean dry bandage and Ace wrap replaced.  I instructed the patient to continue to keep his left leg: dry do not shower or tub bathe.  We will need to check his incision in approximately 2 weeks.  He was seen approximately 1-1/2 weeks ago by Dr. Oneida Alar in the office.  He noted decreased velocities of the proximal left popliteal artery stent.  This was concerning for the possibility of inflow stenosis. He will need to follow-up with Dr. Trula Slade in this regard.  Discussed compliance with  medication, continue to walk as much as he can and to call us for concerns about his incision, worsening pain or rest pain.  Barbie Banner, PA-C [facility] [phone]  Clinic MD:  [place]

## 2020-03-12 ENCOUNTER — Telehealth: Payer: Self-pay | Admitting: Cardiology

## 2020-03-12 ENCOUNTER — Ambulatory Visit (INDEPENDENT_AMBULATORY_CARE_PROVIDER_SITE_OTHER): Payer: Medicare Other

## 2020-03-12 ENCOUNTER — Ambulatory Visit (INDEPENDENT_AMBULATORY_CARE_PROVIDER_SITE_OTHER): Payer: Medicare Other | Admitting: Cardiology

## 2020-03-12 ENCOUNTER — Encounter: Payer: Self-pay | Admitting: Cardiology

## 2020-03-12 DIAGNOSIS — I2510 Atherosclerotic heart disease of native coronary artery without angina pectoris: Secondary | ICD-10-CM | POA: Diagnosis not present

## 2020-03-12 DIAGNOSIS — Z7901 Long term (current) use of anticoagulants: Secondary | ICD-10-CM | POA: Diagnosis not present

## 2020-03-12 DIAGNOSIS — I429 Cardiomyopathy, unspecified: Secondary | ICD-10-CM

## 2020-03-12 DIAGNOSIS — R55 Syncope and collapse: Secondary | ICD-10-CM | POA: Diagnosis not present

## 2020-03-12 DIAGNOSIS — I998 Other disorder of circulatory system: Secondary | ICD-10-CM

## 2020-03-12 DIAGNOSIS — I70229 Atherosclerosis of native arteries of extremities with rest pain, unspecified extremity: Secondary | ICD-10-CM

## 2020-03-12 DIAGNOSIS — I255 Ischemic cardiomyopathy: Secondary | ICD-10-CM | POA: Diagnosis not present

## 2020-03-12 DIAGNOSIS — D649 Anemia, unspecified: Secondary | ICD-10-CM

## 2020-03-12 DIAGNOSIS — E11649 Type 2 diabetes mellitus with hypoglycemia without coma: Secondary | ICD-10-CM | POA: Diagnosis not present

## 2020-03-12 MED ORDER — CARVEDILOL 12.5 MG PO TABS: 12.5000 mg | ORAL_TABLET | Freq: Two times a day (BID) | ORAL | 3 refills | Status: DC

## 2020-03-12 MED ORDER — CLOPIDOGREL BISULFATE 75 MG PO TABS: 75.0000 mg | ORAL_TABLET | Freq: Every day | ORAL | 3 refills | Status: AC

## 2020-03-12 MED ORDER — SPIRONOLACTONE 25 MG PO TABS: 12.5000 mg | ORAL_TABLET | Freq: Every day | ORAL | 3 refills | Status: AC

## 2020-03-12 MED ORDER — ATORVASTATIN CALCIUM 40 MG PO TABS: 40.0000 mg | ORAL_TABLET | Freq: Every day | ORAL | 3 refills | Status: AC

## 2020-03-12 MED ORDER — APIXABAN 5 MG PO TABS: 5.0000 mg | ORAL_TABLET | Freq: Two times a day (BID) | ORAL | 3 refills | Status: AC

## 2020-03-12 MED ORDER — SACUBITRIL-VALSARTAN 24-26 MG PO TABS: 1.0000 | ORAL_TABLET | Freq: Two times a day (BID) | ORAL | 3 refills | Status: AC

## 2020-03-12 NOTE — Patient Instructions (Signed)
Medication Instructions:  Your Physician recommend you continue on your current medication as directed.    *If you need a refill on your cardiac medications before your next appointment, please call your pharmacy*   Lab Work: None   Testing/Procedures: None   Follow-Up: At [facility], you and your health needs are our priority.  As part of our continuing mission to provide you with exceptional heart care, we have created designated Provider Care Teams.  These Care Teams include your primary Cardiologist (physician) and Advanced Practice Providers (APPs -  Physician Assistants and Nurse Practitioners) who all work together to provide you with the care you need, when you need it.  We recommend signing up for the patient portal called "MyChart".  Sign up information is provided on this After Visit Summary.  MyChart is used to connect with patients for Virtual Visits (Telemedicine).  Patients are able to view lab/test results, encounter notes, upcoming appointments, etc.  Non-urgent messages can be sent to your provider as well.   To learn more about what you can do with MyChart, go to [email or web address].    Your next appointment:   April 19, 2020 @ 10 am  The format for your next appointment:   In Person  Provider:   Rollene Rotunda, MD

## 2020-03-12 NOTE — Telephone Encounter (Signed)
Spoke to Stephan pharmacist with [facility] pharmacy calling to verify patient is taking both eliquis and plavix.Advised patient is on dual antiplatelet therapy.She also wanted to verify patient was started on entresto.Stated she will make sure patient is not taking quinapril.Advised quinapril not listed on his med list.

## 2020-03-12 NOTE — Assessment & Plan Note (Signed)
IDDM- Hgb A1c-12

## 2020-03-12 NOTE — Assessment & Plan Note (Signed)
Secondary to emboli from apical [facility]gent LEA surgery 02/17/2020

## 2020-03-12 NOTE — Telephone Encounter (Signed)
Marchelle Folks with [facility] is calling to discuss whether patient is to take Eliquis and Plavix. She states she would also like to ensure that patient is to begin taking Entresto. Please return call to [place] at [phone].

## 2020-03-12 NOTE — Assessment & Plan Note (Signed)
Pt placed on ASA and plavix after vascular surgery- Eliquis added for suspected apical thrombus emboli

## 2020-03-12 NOTE — Assessment & Plan Note (Signed)
Chronic microcytic anemia- Hgb 9

## 2020-03-12 NOTE — Progress Notes (Signed)
Cardiology Office Note:    Date:  03/12/2020   ID:  Paul [name]., DOB [birth year], MRN [id number]  PCP:  Cleophas Dunker, DO  Cardiologist:  Minus Breeding, MD  Electrophysiologist:  None   Referring MD: Cleophas Dunker, *   No chief complaint on file.   History of Present Illness:    Paul [name]. is a 49 y.o. male with a hx of CAD going back to 2007.  Records indicate he had an MI in 2007 treated with an LAD and circumflex DES.  He had a catheterization in 2011 and again in 2013 that showed patent stents with moderate residual coronary disease.  He has had a depressed EF- 40-45% by echo in June 2019.  Other medical issues include insulin-dependent diabetes treated dyslipidemia, and history of anxiety and PTSD.    He was admitted to the hospital 02/17/2020 with leg weakness and numb, cool extremities.  He was found to have critical limb ischemia. CTA showed nearly occlusive thrombosis within bilateral femoral arteries. Dr. Trula Slade, was consulted and performed an urgent bilateral iliofemoral thrombectomy and left lower extremity fasciotomy 7/10.  Unfortunately, revascularization and return of neurological function on the left was not achieved, so that evening he returned to the OR for successful recanalization of left popliteal artery, posterior tibial artery, and closure of the LLE fasciotomy.   An echocardiogram was done during that admission which showed patient now had an ejection fraction of 25% with apical akinesis.  It is felt that he had an apical thrombus that caused his lower extremity embolism.  After surgery he was placed on aspirin and Plavix, he was then placed on Eliquis 5 mg twice daily.  Discharge summary indicates that Eliquis in addition to dual antiplatelet therapy was discussed and it was decided to proceed with this.  The patient was discharged 02/16/2020.  Plan was to consider further cardiac work-up as an outpatient after he recovered from his  surgery.  He was then seen in the emergency room 02/28/2020 and complained of swelling in his Lt leg.  He was seen by Dr. Trula Slade 02/29/2020.  Lower extremity Dopplers were negative for DVT.  LEA dopplers suggested some decrease in his LLE ABI- 0.64.  Ace wrap was recommended as well as f/u with Dr Trula Slade.  He is in the office today for cardiac follow up.  He is taking his medications but needs refills.  He denies resting SOB.  Past Medical History:  Diagnosis Date  . Anxiety   . Bipolar disorder ([place])   . CAD (coronary artery disease)    Anterior MI 2007 with DES stenting of an occluded LAD, distal 70% stenosis, OM 60% stenosis, circumflex 90% stenosis treated with a drug-eluting stent, PA 90% stenosis treated medically.  Cath January 2013 70-80% diagonal stenosis in a small vessel, OM 50% stenosis, PDA long 75% stenosis. EF 45%. He was managed medically.  . Cardiomyopathy, ischemic    EF 45%  . Diabetes mellitus   . History of chest pain 11/25/2010  . Hyperlipidemia   . Hypertension   . Myocardial infarction ([place])   . PTSD (post-traumatic stress disorder)   . Tuberculosis    1989, took pills for it     Past Surgical History:  Procedure Laterality Date  . APPLICATION OF WOUND VAC Left 02/17/2020   Procedure: APPLICATION OF WOUND VAC;  Surgeon: Serafina Mitchell, MD;  Location: [place];  Service: Vascular;  Laterality: Left;  . Catherization  08/25/11   Dr.  Hochrein  . CORONARY ANGIOPLASTY WITH STENT PLACEMENT  207, 2011   4 stents placed  . DIRECT LARYNGOSCOPY Left 08/14/2019   Procedure: DIRECT LARYNGOSCOPY;  Surgeon: Rozetta Nunnery, MD;  Location: [facility];  Service: ENT;  Laterality: Left;  . FASCIOTOMY Left 02/17/2020   Procedure: Four Compartment FASCIOTOMY;  Surgeon: Serafina Mitchell, MD;  Location: [facility];  Service: Vascular;  Laterality: Left;  . FASCIOTOMY CLOSURE Left 02/17/2020   Procedure: FASCIOTOMY CLOSURE;  Surgeon: Serafina Mitchell, MD;  Location: [place];  Service:  Vascular;  Laterality: Left;  . LEFT HEART CATHETERIZATION WITH CORONARY ANGIOGRAM N/A 08/25/2011   Procedure: LEFT HEART CATHETERIZATION WITH CORONARY ANGIOGRAM;  Surgeon: Minus Breeding, MD;  Location: [facility];  Service: Cardiovascular;  Laterality: N/A;  . LOWER EXTREMITY ANGIOGRAM Bilateral 02/17/2020   Procedure: LOWER EXTREMITY ANGIOGRAM;  Surgeon: Serafina Mitchell, MD;  Location: [facility];  Service: Vascular;  Laterality: Bilateral;  . LOWER EXTREMITY ANGIOGRAM Left 02/17/2020   Procedure: LOWER EXTREMITY ANGIOGRAM WITH Stenting Left Popliteal Artery, Angioplasty posterior Tibial Artery;  Surgeon: Serafina Mitchell, MD;  Location: [place];  Service: Vascular;  Laterality: Left;  Marland Kitchen MASS BIOPSY Left 08/14/2019   Procedure: BIOPSY EXCISION OF LEFT NECK MASS;  Surgeon: Rozetta Nunnery, MD;  Location: [place];  Service: ENT;  Laterality: Left;  . THROMBECTOMY FEMORAL ARTERY Bilateral 02/17/2020   Procedure: Bilateral  Iliofemoral Thrombectomy and exposure of left lower leg.;  Surgeon: Serafina Mitchell, MD;  Location: [facility];  Service: Vascular;  Laterality: Bilateral;    Current Medications: Current Meds  Medication Sig  . apixaban (ELIQUIS) 5 MG TABS tablet Take 1 tablet (5 mg total) by mouth 2 (two) times daily.  . ARIPiprazole (ABILIFY) 5 MG tablet Take 1 tablet by mouth once daily (Patient taking differently: Take 5 mg by mouth daily. )  . aspirin 81 MG EC tablet Take 1 tablet (81 mg total) by mouth daily.  Marland Kitchen atorvastatin (LIPITOR) 40 MG tablet Take 1 tablet (40 mg total) by mouth at bedtime.  . Blood Glucose Monitoring Suppl (ONE TOUCH ULTRA 2) W/DEVICE KIT Check sugar once daily. ICD10 E11.65  . carvedilol (COREG) 12.5 MG tablet Take 1 tablet (12.5 mg total) by mouth 2 (two) times daily with a meal.  . citalopram (CELEXA) 20 MG tablet Take 1 tablet (20 mg total) by mouth 2 (two) times daily.  . clopidogrel (PLAVIX) 75 MG tablet Take 1 tablet (75 mg total) by mouth daily.  . Continuous  Glucose Monitor KIT 1 kit by Does not apply route daily.  . empagliflozin (JARDIANCE) 25 MG TABS tablet Take 25 mg by mouth daily.  . furosemide (LASIX) 40 MG tablet Take 1 tablet (40 mg total) by mouth daily as needed for fluid or edema.  Marland Kitchen glucose blood (ONE TOUCH ULTRA TEST) test strip Check sugar once daily in the morning. ICD10 E11.65  . insulin glargine (LANTUS) 100 UNIT/ML injection Inject 0.2 mLs (20 Units total) into the skin at bedtime. (Patient taking differently: Inject 15 Units into the skin daily. )  . metFORMIN (GLUCOPHAGE) 1000 MG tablet Take 1,000 mg by mouth 2 (two) times daily.  . Multiple Vitamin (MULTIVITAMIN WITH MINERALS) TABS tablet Take 1 tablet by mouth daily.  . nitroGLYCERIN (NITROSTAT) 0.4 MG SL tablet Place 1 tablet (0.4 mg total) under the tongue every 5 (five) minutes as needed for chest pain.  . ONE TOUCH LANCETS MISC Use to check sugar once daily. ICD10 E11.65  .  oxyCODONE-acetaminophen (PERCOCET/ROXICET) 5-325 MG tablet Take 1 tablet by mouth every 6 (six) hours as needed for severe pain.  . sacubitril-valsartan (ENTRESTO) 24-26 MG Take 1 tablet by mouth 2 (two) times daily.  Marland Kitchen spironolactone (ALDACTONE) 25 MG tablet Take 0.5 tablets (12.5 mg total) by mouth daily.  . traZODone (DESYREL) 50 MG tablet TAKE 1 TABLET BY MOUTH AT BEDTIME AS NEEDED FOR SLEEP (Patient taking differently: Take 50 mg by mouth at bedtime as needed for sleep. )  . triamcinolone cream (KENALOG) 0.1 % Apply 1 application topically 2 (two) times daily.  . [DISCONTINUED] apixaban (ELIQUIS) 5 MG TABS tablet Take 1 tablet (5 mg total) by mouth 2 (two) times daily.  . [DISCONTINUED] atorvastatin (LIPITOR) 40 MG tablet Take 1 tablet (40 mg total) by mouth at bedtime.  . [DISCONTINUED] carvedilol (COREG) 12.5 MG tablet Take 1 tablet (12.5 mg total) by mouth 2 (two) times daily with a meal.  . [DISCONTINUED] clopidogrel (PLAVIX) 75 MG tablet Take 1 tablet (75 mg total) by mouth daily.  .  [DISCONTINUED] sacubitril-valsartan (ENTRESTO) 24-26 MG Take 1 tablet by mouth 2 (two) times daily.  . [DISCONTINUED] spironolactone (ALDACTONE) 25 MG tablet Take 0.5 tablets (12.5 mg total) by mouth daily.     Allergies:   Shrimp [shellfish allergy]   Social History   Socioeconomic History  . Marital status: Single    Spouse name: Not on file  . Number of children: Not on file  . Years of education: Not on file  . Highest education level: Not on file  Occupational History  . Not on file  Tobacco Use  . Smoking status: Never Smoker  . Smokeless tobacco: Never Used  Vaping Use  . Vaping Use: Never used  Substance and Sexual Activity  . Alcohol use: No  . Drug use: No  . Sexual activity: Not on file  Other Topics Concern  . Not on file  Social History Narrative  . Not on file   Social Determinants of Health   Financial Resource Strain:   . Difficulty of Paying Living Expenses:   Food Insecurity:   . Worried About Charity fundraiser in the Last Year:   . Arboriculturist in the Last Year:   Transportation Needs:   . Film/video editor (Medical):   Marland Kitchen Lack of Transportation (Non-Medical):   Physical Activity:   . Days of Exercise per Week:   . Minutes of Exercise per Session:   Stress:   . Feeling of Stress :   Social Connections:   . Frequency of Communication with Friends and Family:   . Frequency of Social Gatherings with Friends and Family:   . Attends Religious Services:   . Active Member of Clubs or Organizations:   . Attends Archivist Meetings:   Marland Kitchen Marital Status:      Family History: The patient's family history includes Cancer in his maternal grandmother; Diabetes in his maternal aunt and maternal uncle; Hypertension in his father and mother.  ROS:   Please see the history of present illness.     All other systems reviewed and are negative.  EKGs/Labs/Other Studies Reviewed:    The following studies were reviewed today:  Echo  02/21/2020- IMPRESSIONS   1. No apical thrombus noted with the use of Definity contrast. Left  ventricular ejection fraction, by estimation, is 20 to 25%. The left  ventricle has severely decreased function. The left ventricle demonstrates  global hypokinesis.   EKG:  EKG is not ordered today.  The ekg ordered 02/20/2020 demonstrates NSR, poor anterior RW, inferior Qs, HR 85  Recent Labs: 07/14/2019: TSH 1.210 02/21/2020: ALT 16 02/22/2020: BUN 9; Creatinine, Ser 1.01; Hemoglobin 9.2; Platelets 275; Potassium 3.5; Sodium 137  Recent Lipid Panel    Component Value Date/Time   CHOL 120 11/05/2019 0132   CHOL 141 01/10/2018 1419   TRIG 210 (H) 11/05/2019 0132   HDL 18 (L) 11/05/2019 0132   HDL 24 (L) 01/10/2018 1419   CHOLHDL 6.7 11/05/2019 0132   VLDL 42 (H) 11/05/2019 0132   LDLCALC 60 11/05/2019 0132   LDLCALC 72 01/10/2018 1419   LDLDIRECT 84 09/03/2011 1548    Physical Exam:    VS:  BP 108/72   Ht 5' 11"  (1.803 m)   Wt 147 lb 9.6 oz (67 kg)   BMI 20.59 kg/m     Wt Readings from Last 3 Encounters:  03/12/20 147 lb 9.6 oz (67 kg)  03/11/20 146 lb 1.6 oz (66.3 kg)  02/29/20 151 lb (68.5 kg)     GEN: Thin AA male, well developed in no acute distress HEENT: Normal NECK: No JVD; No carotid bruits CARDIAC: RRR, no murmurs, rubs, gallops RESPIRATORY:  Clear to auscultation without rales, wheezing or rhonchi  ABDOMEN: Soft, non-tender, non-distended MUSCULOSKELETAL:  No edema; No deformity  SKIN: Warm and dry NEUROLOGIC:  Alert and oriented x 3 PSYCHIATRIC:  Normal affect   ASSESSMENT:    Critical lower limb ischemia Secondary to emboli from apical HK Urgent LEA surgery 02/17/2020  CAD (coronary artery disease) Anterior MI 2007 with DES stenting of an occluded LAD,  circumflex 90% stenosis treated with a drug-eluting stent  Catheterization done in January 2013 -medical Rx   Diabetes mellitus type 2, uncontrolled ([place]) IDDM- Hgb A1c-12  Cardiomyopathy  ([place]) New drop in EF July 2021- EF 25% with apical AK  Anticoagulated Pt placed on ASA and plavix after vascular surgery- Eliquis added for suspected apical thrombus emboli   Chronic anemia Chronic microcytic anemia- Hgb 9  PLAN:    Pt is tolerating his current medications.  Will provide refills. Keep f/u with Dr Percival Spanish as scheduled.  He does not appear to be a candidate for further cardiac evaluation at this time but will review with Dr Percival Spanish.    Medication Adjustments/Labs and Tests Ordered: Current medicines are reviewed at length with the patient today.  Concerns regarding medicines are outlined above.  No orders of the defined types were placed in this encounter.  Meds ordered this encounter  Medications  . apixaban (ELIQUIS) 5 MG TABS tablet    Sig: Take 1 tablet (5 mg total) by mouth 2 (two) times daily.    Dispense:  180 tablet    Refill:  3  . carvedilol (COREG) 12.5 MG tablet    Sig: Take 1 tablet (12.5 mg total) by mouth 2 (two) times daily with a meal.    Dispense:  90 tablet    Refill:  3  . sacubitril-valsartan (ENTRESTO) 24-26 MG    Sig: Take 1 tablet by mouth 2 (two) times daily.    Dispense:  180 tablet    Refill:  3  . spironolactone (ALDACTONE) 25 MG tablet    Sig: Take 0.5 tablets (12.5 mg total) by mouth daily.    Dispense:  45 tablet    Refill:  3  . clopidogrel (PLAVIX) 75 MG tablet    Sig: Take 1 tablet (75 mg total) by mouth daily.  Dispense:  90 tablet    Refill:  3  . atorvastatin (LIPITOR) 40 MG tablet    Sig: Take 1 tablet (40 mg total) by mouth at bedtime.    Dispense:  90 tablet    Refill:  3    Patient Instructions  Medication Instructions:  Your Physician recommend you continue on your current medication as directed.    *If you need a refill on your cardiac medications before your next appointment, please call your pharmacy*   Lab Work: None   Testing/Procedures: None   Follow-Up: At [facility], you and your  health needs are our priority.  As part of our continuing mission to provide you with exceptional heart care, we have created designated Provider Care Teams.  These Care Teams include your primary Cardiologist (physician) and Advanced Practice Providers (APPs -  Physician Assistants and Nurse Practitioners) who all work together to provide you with the care you need, when you need it.  We recommend signing up for the patient portal called "MyChart".  Sign up information is provided on this After Visit Summary.  MyChart is used to connect with patients for Virtual Visits (Telemedicine).  Patients are able to view lab/test results, encounter notes, upcoming appointments, etc.  Non-urgent messages can be sent to your provider as well.   To learn more about what you can do with MyChart, go to [email or web address].    Your next appointment:   April 19, 2020 @ 10 am  The format for your next appointment:   In Person  Provider:   Minus Breeding, MD        Signed, Kerin Ransom, PA-C  03/12/2020 3:02 PM    [place]

## 2020-03-12 NOTE — Assessment & Plan Note (Signed)
Anterior MI 2007 with DES stenting of an occluded LAD,  circumflex 90% stenosis treated with a drug-eluting stent  Catheterization done in January 2013 -medical Rx

## 2020-03-12 NOTE — Assessment & Plan Note (Signed)
New drop in EF July 2021- EF 25% with apical AK

## 2020-03-18 ENCOUNTER — Telehealth: Payer: Self-pay | Admitting: Cardiology

## 2020-03-18 NOTE — Telephone Encounter (Signed)
No # to return call to [employer].  Strips printed from online.    Alert from 03/18/20 at 11:07 AM-Afib with RVR rate 160 and SR with PVCs (6 in 1 min).   No post EKG recorded.    Patient called by company and reported he was outside walking up a hill/asymptomatic.     Called patient to discuss-denies SOB, CP, dizziness, palpitations.  Unsure what HR is currently.  Patient on Eliquis, has not missed any doses.   On Coreg 12.5 BID.    Patient will call when he gets home to report BP/HR readings and also request medication refills (he is unsure of medications at current).

## 2020-03-18 NOTE — Telephone Encounter (Signed)
[employer] calling with a abnormal EKG

## 2020-03-19 NOTE — Telephone Encounter (Signed)
Spoke with pt, he is not sure what dose of carvedilol and according to the discharge summ from march, he is to be taking carvedilol 12.5 mg twice daily. Patient voiced understanding and all other medications were reviewed with the patient. Spoke with pt pharmacy and confirmed carvedilol dosage.

## 2020-03-25 ENCOUNTER — Ambulatory Visit (INDEPENDENT_AMBULATORY_CARE_PROVIDER_SITE_OTHER): Payer: Medicare Other | Admitting: Physician Assistant

## 2020-03-25 ENCOUNTER — Other Ambulatory Visit: Payer: Self-pay

## 2020-03-25 VITALS — BP 161/108 | HR 63 | Temp 98.2°F | Resp 20 | Ht 71.0 in | Wt 158.8 lb

## 2020-03-25 DIAGNOSIS — I739 Peripheral vascular disease, unspecified: Secondary | ICD-10-CM

## 2020-03-25 NOTE — Progress Notes (Addendum)
    Postoperative Visit   History of Present Illness   Paul [name]. is a 49 y.o. year old male who presented to the emergency department on 02/17/2020 with bilateral lower extremity ischemia.  He was brought to the operating room and underwent bilateral iliofemoral thrombectomy and left tibial thrombectomy with left 4 compartment fasciotomy.  Postoperatively left lower extremity Doppler signals were lost and he was brought back to the operating room and underwent reexposure of left popliteal artery with popliteal artery stenting and TP trunk angioplasty.  Patient states he is walking much better and denies claudication, rest pain, or wounds of bilateral lower extremities.  He does have some thin yellowish drainage from left popliteal incision however he is continuing to wrap his left lower leg with an Ace wrap and elevate when possible during the day.  He is taking his aspirin and Plavix and Eliquis daily.  He is also taking a statin daily.    Patient is also complaining of impotence.  He has not yet discussed this with his primary care physician.  For VQI Use Only   PRE-ADM LIVING: Home  AMB STATUS: Ambulatory with Assistance   Physical Examination   Vitals:   03/25/20 0946  BP: (!) 161/108  Pulse: 63  Resp: 20  Temp: 98.2 F (36.8 C)  TempSrc: Temporal  SpO2: 98%  Weight: 158 lb 12.8 oz (72 kg)  Height: 5\' 11"  (1.803 m)    BLE: Bilateral groin incisions well-healed; left popliteal incision with staples still in place, minimal thin yellowish drainage from open area of distal popliteal incision; brisk bilateral PT signals by Doppler   Medical Decision Making   Zarion Oliff. is a 49 y.o. year old male who presents s/p bilateral iliofemoral thrombectomy and left tibial thrombectomy with left 4 compartment fasciotomy.  Postoperatively left lower extremity Doppler signals were lost and he was brought back to the operating room and underwent reexposure of left  popliteal artery with popliteal artery stenting and TP trunk angioplasty.  . Bilateral lower extremities well perfused with PT Doppler signals . Bilateral groin incisions well-healed . Staples were removed from left popliteal incision; patient was instructed to cleanse at least twice daily with soap and water and to pat dry; he can also continue to elevate his leg and wrap with an Ace bandage below the knee . Findings of low velocities proximal to left popliteal stent were discussed with Dr. 54 who did not recommend intervention at this time.  We will recheck arterial duplex in about 3 months. . Patient will follow up in 3 to 4 weeks to make sure left popliteal incision is healing well . I also recommended that patient follow up with his PCP for management of impotence.   Myra Gianotti PA-C [facility] Office: [phone]  Clinic MD: [phone]

## 2020-03-27 ENCOUNTER — Other Ambulatory Visit: Payer: Self-pay | Admitting: *Deleted

## 2020-03-27 DIAGNOSIS — I739 Peripheral vascular disease, unspecified: Secondary | ICD-10-CM

## 2020-03-27 DIAGNOSIS — I70229 Atherosclerosis of native arteries of extremities with rest pain, unspecified extremity: Secondary | ICD-10-CM

## 2020-03-27 DIAGNOSIS — I998 Other disorder of circulatory system: Secondary | ICD-10-CM

## 2020-03-28 DIAGNOSIS — Z23 Encounter for immunization: Secondary | ICD-10-CM | POA: Diagnosis not present

## 2020-04-18 ENCOUNTER — Ambulatory Visit: Payer: Medicare Other

## 2020-04-18 DIAGNOSIS — Z7189 Other specified counseling: Secondary | ICD-10-CM | POA: Insufficient documentation

## 2020-04-18 NOTE — Progress Notes (Addendum)
Cardiology Office Note   Date:  04/19/2020   ID:  Paul Hatchet., DOB 1971-01-26, MRN [id number]  PCP:  Cleophas Dunker, DO  Cardiologist:   Minus Breeding, MD     Chief Complaint  Patient presents with  . Coronary Artery Disease      History of Present Illness: Paul Yim. is a 49 y.o. male who presents for follow up of CAD.  he had an MI in 2007 treated with an LAD and circumflex DES.  He had a catheterization in 2011 and again in 2013 that showed patent stents with moderate residual coronary disease.  He has had a depressed EF- 40-45% by echo in June 2019.  He was admitted to the hospital 02/17/2020 with leg weakness and numb, cool extremities.  He was found to have critical limb ischemia.CTA showed nearly occlusive thrombosis within bilateral femoral arteries. Dr. Trula Slade, was consulted and performed anurgent bilateral iliofemoral thrombectomy and left lower extremity fasciotomy7/10.Unfortunately, revascularization and return of neurological function on the left was not achieved, so that evening he returned to the OR for successful recanalization of left popliteal artery, posterior tibial artery, and closure of the LLE fasciotomy.  An echocardiogram was done during that admission which showed patient now had an ejection fraction of 25% with apical akinesis.  It is felt that he had an apical thrombus that caused his lower extremity embolism.  After surgery he was placed on aspirin and Plavix, he was then placed on Eliquis 5 mg twice daily.  Discharge summary indicates that Eliquis in addition to dual antiplatelet therapy was discussed and it was decided to proceed with this.  The patient was discharged 02/16/2020. He was then seen in the emergency room 02/28/2020 and complained of swelling in his Lt leg.  He was seen by Dr. Trula Slade 02/29/2020.  Lower extremity Dopplers were negative for DVT.  LEA dopplers suggested some decrease in his LLE ABI- 0.64.  Ace wrap  was recommended as well as f/u with Dr Trula Slade.  He returns for follow up.  He has been walking slowly with a walker.  He missed an appointment with vascular surgery apparently.  He has this rescheduled.  He is having some numbness where he had surgery on his left leg.  He has a little bit of left leg pain at night but he is not describing this during the day.  He is not having any new shortness of breath, PND or orthopnea.  He has some chronic dyspnea on exertion.  He is not had any new weight gain or edema.  He unfortunately was in transition where he was living and he missed a couple of doses of his medications for the last couple of days although he says he has all of his medicines.  Of note I did review of the report.  He has some nonsustained atrial fibrillation.  Past Medical History:  Diagnosis Date  . Anxiety   . Bipolar disorder ([place])   . CAD (coronary artery disease)    Anterior MI 2007 with DES stenting of an occluded LAD, distal 70% stenosis, OM 60% stenosis, circumflex 90% stenosis treated with a drug-eluting stent, PA 90% stenosis treated medically.  Cath January 2013 70-80% diagonal stenosis in a small vessel, OM 50% stenosis, PDA long 75% stenosis. EF 45%. He was managed medically.  . Cardiomyopathy, ischemic    EF 45%  . Diabetes mellitus   . History of chest pain 11/25/2010  . Hyperlipidemia   . Hypertension   .  Myocardial infarction ([place])   . PTSD (post-traumatic stress disorder)   . Tuberculosis    1989, took pills for it     Past Surgical History:  Procedure Laterality Date  . APPLICATION OF WOUND VAC Left 02/17/2020   Procedure: APPLICATION OF WOUND VAC;  Surgeon: Serafina Mitchell, MD;  Location: [place];  Service: Vascular;  Laterality: Left;  . Catherization  08/25/11   Dr. Percival Spanish  . CORONARY ANGIOPLASTY WITH STENT PLACEMENT  207, 2011   4 stents placed  . DIRECT LARYNGOSCOPY Left 08/14/2019   Procedure: DIRECT LARYNGOSCOPY;  Surgeon: Rozetta Nunnery, MD;   Location: [place];  Service: ENT;  Laterality: Left;  . FASCIOTOMY Left 02/17/2020   Procedure: Four Compartment FASCIOTOMY;  Surgeon: Serafina Mitchell, MD;  Location: [place];  Service: Vascular;  Laterality: Left;  . FASCIOTOMY CLOSURE Left 02/17/2020   Procedure: FASCIOTOMY CLOSURE;  Surgeon: Serafina Mitchell, MD;  Location: [place];  Service: Vascular;  Laterality: Left;  . LEFT HEART CATHETERIZATION WITH CORONARY ANGIOGRAM N/A 08/25/2011   Procedure: LEFT HEART CATHETERIZATION WITH CORONARY ANGIOGRAM;  Surgeon: Minus Breeding, MD;  Location: [facility] CATH LAB;  Service: Cardiovascular;  Laterality: N/A;  . LOWER EXTREMITY ANGIOGRAM Bilateral 02/17/2020   Procedure: LOWER EXTREMITY ANGIOGRAM;  Surgeon: Serafina Mitchell, MD;  Location: [facility] OR;  Service: Vascular;  Laterality: Bilateral;  . LOWER EXTREMITY ANGIOGRAM Left 02/17/2020   Procedure: LOWER EXTREMITY ANGIOGRAM WITH Stenting Left Popliteal Artery, Angioplasty posterior Tibial Artery;  Surgeon: Serafina Mitchell, MD;  Location: [place];  Service: Vascular;  Laterality: Left;  Marland Kitchen MASS BIOPSY Left 08/14/2019   Procedure: BIOPSY EXCISION OF LEFT NECK MASS;  Surgeon: Rozetta Nunnery, MD;  Location: [place];  Service: ENT;  Laterality: Left;  . THROMBECTOMY FEMORAL ARTERY Bilateral 02/17/2020   Procedure: Bilateral  Iliofemoral Thrombectomy and exposure of left lower leg.;  Surgeon: Serafina Mitchell, MD;  Location: [facility] OR;  Service: Vascular;  Laterality: Bilateral;     Current Outpatient Medications  Medication Sig Dispense Refill  . apixaban (ELIQUIS) 5 MG TABS tablet Take 1 tablet (5 mg total) by mouth 2 (two) times daily. 180 tablet 3  . ARIPiprazole (ABILIFY) 5 MG tablet Take 1 tablet by mouth once daily (Patient taking differently: Take 5 mg by mouth daily. ) 30 tablet 0  . aspirin 81 MG EC tablet Take 1 tablet (81 mg total) by mouth daily. 90 tablet 0  . atorvastatin (LIPITOR) 40 MG tablet Take 1 tablet (40 mg total) by mouth at bedtime. 90 tablet 3  .  Blood Glucose Monitoring Suppl (ONE TOUCH ULTRA 2) W/DEVICE KIT Check sugar once daily. ICD10 E11.65 1 each 0  . carvedilol (COREG) 25 MG tablet Take 1 tablet (25 mg total) by mouth 2 (two) times daily with a meal. 180 tablet 3  . citalopram (CELEXA) 20 MG tablet Take 1 tablet (20 mg total) by mouth 2 (two) times daily. 180 tablet 3  . clopidogrel (PLAVIX) 75 MG tablet Take 1 tablet (75 mg total) by mouth daily. 90 tablet 3  . Continuous Glucose Monitor KIT 1 kit by Does not apply route daily. 1 kit 0  . empagliflozin (JARDIANCE) 25 MG TABS tablet Take 25 mg by mouth daily. 90 tablet 3  . glucose blood (ONE TOUCH ULTRA TEST) test strip Check sugar once daily in the morning. ICD10 E11.65 30 each 5  . insulin glargine (LANTUS) 100 UNIT/ML injection Inject 0.2 mLs (20 Units total) into the  skin at bedtime. (Patient taking differently: Inject 15 Units into the skin daily. ) 10 mL 11  . metFORMIN (GLUCOPHAGE) 1000 MG tablet Take 1,000 mg by mouth 2 (two) times daily.    . Multiple Vitamin (MULTIVITAMIN WITH MINERALS) TABS tablet Take 1 tablet by mouth daily.    . nitroGLYCERIN (NITROSTAT) 0.4 MG SL tablet Place 1 tablet (0.4 mg total) under the tongue every 5 (five) minutes as needed for chest pain. 30 tablet 1  . ONE TOUCH LANCETS MISC Use to check sugar once daily. ICD10 E11.65 30 each 5  . oxyCODONE-acetaminophen (PERCOCET/ROXICET) 5-325 MG tablet Take 1 tablet by mouth every 6 (six) hours as needed for severe pain. 6 tablet 0  . sacubitril-valsartan (ENTRESTO) 24-26 MG Take 1 tablet by mouth 2 (two) times daily. 180 tablet 3  . spironolactone (ALDACTONE) 25 MG tablet Take 0.5 tablets (12.5 mg total) by mouth daily. 45 tablet 3  . traZODone (DESYREL) 50 MG tablet TAKE 1 TABLET BY MOUTH AT BEDTIME AS NEEDED FOR SLEEP (Patient taking differently: Take 50 mg by mouth at bedtime as needed for sleep. ) 30 tablet 0  . triamcinolone cream (KENALOG) 0.1 % Apply 1 application topically 2 (two) times daily.  453.6 g 0  . furosemide (LASIX) 40 MG tablet Take 1 tablet (40 mg total) by mouth daily as needed for fluid or edema. 30 tablet 0   No current facility-administered medications for this visit.    Allergies:   Shrimp [shellfish allergy]    ROS:  Please see the history of present illness.   Otherwise, review of systems are positive for none.   All other systems are reviewed and negative.    PHYSICAL EXAM: VS:  BP (!) 160/112   Pulse 78   Ht _0  (1.803 m)   Wt 159 lb (72.1 kg)   BMI 22.18 kg/m  , BMI Body mass index is 22.18 kg/m. GENERAL:  Well appearing HEENT:  Pupils equal round and reactive, fundi not visualized, oral mucosa unremarkable NECK:  No jugular venous distention, waveform within normal limits, carotid upstroke brisk and symmetric, no bruits, no thyromegaly LYMPHATICS:  No cervical, inguinal adenopathy LUNGS:  Clear to auscultation bilaterally BACK:  No CVA tenderness CHEST:  Unremarkable HEART:  PMI not displaced or sustained,S1 and S2 within normal limits, no S3, no S4, no clicks, no rubs, no murmurs ABD:  Flat, positive bowel sounds normal in frequency in pitch, no bruits, no rebound, no guarding, no midline pulsatile mass, no hepatomegaly, no splenomegaly EXT:  2 plus pulses decreased left dorsalis pedis/posterior tibialis, mild left greater than right leg nonpitting edema, no cyanosis no clubbing SKIN:  No rashes no nodules NEURO:  Cranial nerves II through XII grossly intact, motor grossly intact throughout PSYCH:  Cognitively intact, oriented to person place and time    EKG:  EKG is not ordered today.    Recent Labs: 07/14/2019: TSH 1.210 02/21/2020: ALT 16 02/22/2020: BUN 9; Creatinine, Ser 1.01; Hemoglobin 9.2; Platelets 275; Potassium 3.5; Sodium 137    Lipid Panel    Component Value Date/Time   CHOL 120 11/05/2019 0132   CHOL 141 01/10/2018 1419   TRIG 210 (H) 11/05/2019 0132   HDL 18 (L) 11/05/2019 0132   HDL 24 (L) 01/10/2018 1419    CHOLHDL 6.7 11/05/2019 0132   VLDL 42 (H) 11/05/2019 0132   LDLCALC 60 11/05/2019 0132   LDLCALC 72 01/10/2018 1419   LDLDIRECT 84 09/03/2011 1548  Wt Readings from Last 3 Encounters:  04/19/20 159 lb (72.1 kg)  03/25/20 158 lb 12.8 oz (72 kg)  03/12/20 147 lb 9.6 oz (67 kg)      Other studies Reviewed: Additional studies/ records that were reviewed today include: Hospital records. Review of the above records demonstrates:  Please see elsewhere in the note.     ASSESSMENT AND PLAN:  Critical lower limb ischemia I sent a message to Dr. Trula Slade.  I would like to discontinue aspirin and Plavix if okay.  I reiterated the importance of follow-up with vascular surgery and reviewed the date and time of the patient's appointment.  Assuming this was LV thrombus I am going to continue the Eliquis for now.  ADDENDUM:  I communicated with Dr. Trula Slade.  He would like the patient to remain on triple therapy until the end of October and then he can be on Eliquis and ASA.   I will schedule follow up with Kerin Ransom in late October.    CAD (coronary artery disease) He has had previous stenting of an occluded LAD and circumflex 90% stenosis stenting.   He has not had any active chest discomfort although his ejection fraction slightly reduced.  I will consider ischemia work-up in the future if his EF remains low.  Last catheterization was done in 2013 at which point he had medical management.   Diabetes mellitus type 2, uncontrolled ([place]) His A1c was last 9.2.    He needs to follow up with his PCP.   Cardiomyopathy [facility]) I will reassess with repeat echo in Jan.    I will increase the Coreg to 25 mg bid.    Anticoagulated As above.  I will clarify with Dr. Trula Slade whether we stop the ASA or Plavix.  Atrial fib The patient wore a monitor with brief runs of nonsustained atrial fibrillation.  He will remain on chronic anticoagulation.  Covid education He is supposed to get the second dose  of his vaccine soon.  Current medicines are reviewed at length with the patient today.  The patient does not have concerns regarding medicines.  The following changes have been made:  no change  Labs/ tests ordered today include: None  Orders Placed This Encounter  Procedures  . ECHOCARDIOGRAM COMPLETE     Disposition:   FU with Kerin Ransom in two months.     Signed, Minus Breeding, MD  04/19/2020 11:03 AM    [place]

## 2020-04-19 ENCOUNTER — Ambulatory Visit (INDEPENDENT_AMBULATORY_CARE_PROVIDER_SITE_OTHER): Payer: Medicare Other | Admitting: Cardiology

## 2020-04-19 ENCOUNTER — Encounter: Payer: Self-pay | Admitting: Cardiology

## 2020-04-19 ENCOUNTER — Other Ambulatory Visit: Payer: Self-pay

## 2020-04-19 VITALS — BP 160/112 | HR 78 | Ht 71.0 in | Wt 159.0 lb

## 2020-04-19 DIAGNOSIS — Z7189 Other specified counseling: Secondary | ICD-10-CM | POA: Diagnosis not present

## 2020-04-19 DIAGNOSIS — I2510 Atherosclerotic heart disease of native coronary artery without angina pectoris: Secondary | ICD-10-CM | POA: Diagnosis not present

## 2020-04-19 DIAGNOSIS — E118 Type 2 diabetes mellitus with unspecified complications: Secondary | ICD-10-CM

## 2020-04-19 DIAGNOSIS — Z794 Long term (current) use of insulin: Secondary | ICD-10-CM

## 2020-04-19 DIAGNOSIS — I70229 Atherosclerosis of native arteries of extremities with rest pain, unspecified extremity: Secondary | ICD-10-CM

## 2020-04-19 DIAGNOSIS — I998 Other disorder of circulatory system: Secondary | ICD-10-CM | POA: Diagnosis not present

## 2020-04-19 DIAGNOSIS — I255 Ischemic cardiomyopathy: Secondary | ICD-10-CM

## 2020-04-19 MED ORDER — CARVEDILOL 25 MG PO TABS: 25.0000 mg | ORAL_TABLET | Freq: Two times a day (BID) | ORAL | 3 refills | Status: DC

## 2020-04-19 NOTE — Patient Instructions (Addendum)
Medication Instructions:  INCREASE YOUR CARVEDILOL TO 25 MG TWICE A DAY   *If you need a refill on your cardiac medications before your next appointment, please call your pharmacy*  Lab Work: NONE  Testing/Procedures: Your physician has requested that you have an echocardiogram. Echocardiography is a painless test that uses sound waves to create images of your heart. It provides your doctor with information about the size and shape of your heart and how well your heart's chambers and valves are working. This procedure takes approximately one hour. There are no restrictions for this procedure. [facility] AT [address] [place]   Follow-Up: At [facility], you and your health needs are our priority.  As part of our continuing mission to provide you with exceptional heart care, we have created designated Provider Care Teams.  These Care Teams include your primary Cardiologist (physician) and Advanced Practice Providers (APPs -  Physician Assistants and Nurse Practitioners) who all work together to provide you with the care you need, when you need it.  We recommend signing up for the patient portal called "MyChart".  Sign up information is provided on this After Visit Summary.  MyChart is used to connect with patients for Virtual Visits (Telemedicine).  Patients are able to view lab/test results, encounter notes, upcoming appointments, etc.  Non-urgent messages can be sent to your provider as well.   To learn more about what you can do with MyChart, go to [email or web address].    Your next appointment:   2 month(s)  The format for your next appointment:   In Person  Provider:   LUKE K PA   FOLLOW UP WITH YOUR PRIMARY CARE DOCTOR

## 2020-04-24 DIAGNOSIS — Z23 Encounter for immunization: Secondary | ICD-10-CM | POA: Diagnosis not present

## 2020-04-25 ENCOUNTER — Other Ambulatory Visit: Payer: Self-pay | Admitting: Family Medicine

## 2020-04-29 ENCOUNTER — Telehealth: Payer: Self-pay | Admitting: *Deleted

## 2020-04-29 NOTE — Telephone Encounter (Signed)
Tried to call patient, no voicemail set up  °

## 2020-04-29 NOTE — Telephone Encounter (Signed)
-----   Message from Rollene Rotunda, MD sent at 04/28/2020 10:05 PM EDT ----- Please move up his appt with [facility] to late October and cancel the December appt.  Please let the patient know.  Thanks.

## 2020-05-03 ENCOUNTER — Ambulatory Visit (HOSPITAL_COMMUNITY)
Admission: EM | Admit: 2020-05-03 | Discharge: 2020-05-03 | Disposition: A | Discharge status: Home / Self Care | Payer: Medicare Other | Attending: Physician Assistant | Admitting: Physician Assistant

## 2020-05-03 ENCOUNTER — Encounter (HOSPITAL_COMMUNITY): Payer: Self-pay | Admitting: *Deleted

## 2020-05-03 ENCOUNTER — Other Ambulatory Visit: Payer: Self-pay

## 2020-05-03 DIAGNOSIS — R609 Edema, unspecified: Secondary | ICD-10-CM | POA: Diagnosis not present

## 2020-05-03 MED ORDER — FUROSEMIDE 40 MG PO TABS: 40.0000 mg | ORAL_TABLET | Freq: Every day | ORAL | 1 refills | Status: AC

## 2020-05-03 NOTE — ED Triage Notes (Addendum)
Patient in with complaints with trouble walking since having stents placed in his legs in May 2021.Patient states he is having pain and legs are tight. Rates pain at 10. Patient states he is not able to walk far. Patient has not taken any medications at home for symptoms. Patient states he has not taken any of his daily medications today. Swelling noted to bilateral legs. Patients states he has SOB with walking. Patient was able to ambulate to room using walker. Patient requesting 90 day supply of Furosemide.

## 2020-05-03 NOTE — Discharge Instructions (Signed)
Schedule to see your Physician forrecheck 

## 2020-05-03 NOTE — ED Provider Notes (Signed)
[name]    CSN: [id number] Arrival date & time: 05/03/20  1258      History   Chief Complaint No chief complaint on file.   HPI Paul Rugg. is a 49 y.o. male.   Pt reports both legs are swelling   The history is provided by the patient. No language interpreter was used.  Leg Pain Location:  Leg Leg location:  L leg and R leg Pain details:    Quality:  Aching   Severity:  Moderate   Onset quality:  Gradual   Timing:  Constant   Progression:  Worsening Chronicity:  New Relieved by:  Nothing Worsened by:  Nothing Ineffective treatments:  None tried Risk factors: no concern for non-accidental trauma   Pt is requesting an rx for lasix.   Past Medical History:  Diagnosis Date  . Anxiety   . Bipolar disorder ([place])   . CAD (coronary artery disease)    Anterior MI 2007 with DES stenting of an occluded LAD, distal 70% stenosis, OM 60% stenosis, circumflex 90% stenosis treated with a drug-eluting stent, PA 90% stenosis treated medically.  Cath January 2013 70-80% diagonal stenosis in a small vessel, OM 50% stenosis, PDA long 75% stenosis. EF 45%. He was managed medically.  . Cardiomyopathy, ischemic    EF 45%  . Diabetes mellitus   . History of chest pain 11/25/2010  . Hyperlipidemia   . Hypertension   . Myocardial infarction ([place])   . PTSD (post-traumatic stress disorder)   . Tuberculosis    1989, took pills for it     Patient Active Problem List   Diagnosis Date Noted  . Educated about COVID-19 virus infection 04/18/2020  . Anticoagulated 03/12/2020  . Chronic anemia 03/12/2020  . Systolic congestive heart failure ([place])   . Renal mass   . Sensation of cold in leg 02/17/2020  . Critical lower limb ischemia 02/17/2020  . Type 2 diabetes mellitus with complication, with long-term current use of insulin ([place]) 02/08/2020  . Pierced lip infection 01/16/2020  . Shortness of breath 12/31/2019  . Hospital discharge follow-up 11/08/2019  .  Pneumonia due to COVID-19 virus 11/04/2019  . Neck mass 07/14/2019  . Hypotension 02/20/2019  . PTSD (post-traumatic stress disorder) 12/01/2018  . Cardiomyopathy ([place]) 01/14/2018  . Diabetic neuropathy ([place]) 01/10/2018  . Nausea alone 02/28/2013  . Cold sensation of skin 07/29/2012  . Right hand pain 05/03/2012  . Hypertension   . CAD (coronary artery disease)   . Hyperlipidemia LDL goal <100   . Stented coronary artery 04/30/2011  . Atopic dermatitis 08/19/2010  . Anxiety state 09/27/2009  . Diabetes mellitus type 2, uncontrolled ([place]) 09/19/2009    Past Surgical History:  Procedure Laterality Date  . APPLICATION OF WOUND VAC Left 02/17/2020   Procedure: APPLICATION OF WOUND VAC;  Surgeon: Serafina Mitchell, MD;  Location: [facility];  Service: Vascular;  Laterality: Left;  . Catherization  08/25/11   Dr. Percival Spanish  . CORONARY ANGIOPLASTY WITH STENT PLACEMENT  207, 2011   4 stents placed  . DIRECT LARYNGOSCOPY Left 08/14/2019   Procedure: DIRECT LARYNGOSCOPY;  Surgeon: Rozetta Nunnery, MD;  Location: [facility];  Service: ENT;  Laterality: Left;  . FASCIOTOMY Left 02/17/2020   Procedure: Four Compartment FASCIOTOMY;  Surgeon: Serafina Mitchell, MD;  Location: [facility];  Service: Vascular;  Laterality: Left;  . FASCIOTOMY CLOSURE Left 02/17/2020   Procedure: FASCIOTOMY CLOSURE;  Surgeon: Serafina Mitchell, MD;  Location: [facility];  Service: Vascular;  Laterality: Left;  . LEFT HEART CATHETERIZATION WITH CORONARY ANGIOGRAM N/A 08/25/2011   Procedure: LEFT HEART CATHETERIZATION WITH CORONARY ANGIOGRAM;  Surgeon: Minus Breeding, MD;  Location[facility] CATH LAB;  Service: Cardiovascular;  Laterality: N/A;  . LOWER EXTREMITY ANGIOGRAM Bilateral 02/17/2020   Procedure: LOWER EXTREMITY ANGIOGRAM;  Surgeon: Serafina Mitchell, MD;  Location: [facility] OR;  Service: Vascular;  Laterality: Bilateral;  . LOWER EXTREMITY ANGIOGRAM Left 02/17/2020   Procedure: LOWER EXTREMITY ANGIOGRAM WITH Stenting Left Popliteal Artery,  Angioplasty posterior Tibial Artery;  Surgeon: Serafina Mitchell, MD;  Location: [place];  Service: Vascular;  Laterality: Left;  Marland Kitchen MASS BIOPSY Left 08/14/2019   Procedure: BIOPSY EXCISION OF LEFT NECK MASS;  Surgeon: Rozetta Nunnery, MD;  Location: [facility];  Service: ENT;  Laterality: Left;  . THROMBECTOMY FEMORAL ARTERY Bilateral 02/17/2020   Procedure: Bilateral  Iliofemoral Thrombectomy and exposure of left lower leg.;  Surgeon: Serafina Mitchell, MD;  Location[facility];  Service: Vascular;  Laterality: Bilateral;       Home Medications    Prior to Admission medications   Medication Sig Start Date End Date Taking? Authorizing Provider  apixaban (ELIQUIS) 5 MG TABS tablet Take 1 tablet (5 mg total) by mouth 2 (two) times daily. 03/12/20   Erlene Quan, PA-C  ARIPiprazole (ABILIFY) 5 MG tablet Take 1 tablet by mouth once daily Patient taking differently: Take 5 mg by mouth daily.  01/09/20   Nuala Alpha, DO  aspirin 81 MG EC tablet Take 1 tablet (81 mg total) by mouth daily. 11/06/19   Lattie Haw, MD  atorvastatin (LIPITOR) 40 MG tablet Take 1 tablet (40 mg total) by mouth at bedtime. 03/12/20   Erlene Quan, PA-C  Blood Glucose Monitoring Suppl (ONE TOUCH ULTRA 2) W/DEVICE KIT Check sugar once daily. ICD10 E11.65 01/24/15   Leone Brand, MD  carvedilol (COREG) 25 MG tablet Take 1 tablet (25 mg total) by mouth 2 (two) times daily with a meal. 04/19/20   Minus Breeding, MD  citalopram (CELEXA) 20 MG tablet Take 1 tablet (20 mg total) by mouth 2 (two) times daily. 10/26/19   Nuala Alpha, DO  clopidogrel (PLAVIX) 75 MG tablet Take 1 tablet (75 mg total) by mouth daily. 03/12/20   Erlene Quan, PA-C  Continuous Glucose Monitor KIT 1 kit by Does not apply route daily. 02/20/19   Caroline More, DO  empagliflozin (JARDIANCE) 25 MG TABS tablet Take 25 mg by mouth daily. 12/01/19   Nuala Alpha, DO  furosemide (LASIX) 40 MG tablet Take 1 tablet (40 mg total) by mouth daily as needed for  fluid or edema. 02/22/20 03/23/20  Autry-Lott, Naaman Plummer, DO  furosemide (LASIX) 40 MG tablet Take 1 tablet (40 mg total) by mouth daily. 05/03/20 05/03/21  Fransico Meadow, PA-C  glucose blood (ONE TOUCH ULTRA TEST) test strip Check sugar once daily in the morning. ICD10 E11.65 01/10/18   Mikell, Jeani Sow, MD  insulin glargine (LANTUS) 100 UNIT/ML injection Inject 0.2 mLs (20 Units total) into the skin at bedtime. Patient taking differently: Inject 15 Units into the skin daily.  12/01/19   Nuala Alpha, DO  metFORMIN (GLUCOPHAGE) 1000 MG tablet Take 1,000 mg by mouth 2 (two) times daily. 11/21/19   [provider]  Multiple Vitamin (MULTIVITAMIN WITH MINERALS) TABS tablet Take 1 tablet by mouth daily.    [provider]  nitroGLYCERIN (NITROSTAT) 0.4 MG SL tablet Place 1 tablet (0.4 mg total) under  the tongue every 5 (five) minutes as needed for chest pain. 10/07/19   Nuala Alpha, DO  ONE TOUCH LANCETS MISC Use to check sugar once daily. ICD10 E11.65 01/09/15   Willeen Niece, MD  oxyCODONE-acetaminophen (PERCOCET/ROXICET) 5-325 MG tablet Take 1 tablet by mouth every 6 (six) hours as needed for severe pain. 02/28/20   Marcello Fennel, PA-C  sacubitril-valsartan (ENTRESTO) 24-26 MG Take 1 tablet by mouth 2 (two) times daily. 03/12/20   Erlene Quan, PA-C  spironolactone (ALDACTONE) 25 MG tablet Take 0.5 tablets (12.5 mg total) by mouth daily. 03/12/20   Erlene Quan, PA-C  traZODone (DESYREL) 50 MG tablet TAKE 1 TABLET BY MOUTH AT BEDTIME AS NEEDED FOR SLEEP Patient taking differently: Take 50 mg by mouth at bedtime as needed for sleep.  01/09/20   Nuala Alpha, DO  triamcinolone cream (KENALOG) 0.1 % Apply 1 application topically 2 (two) times daily. 08/01/19   Sherene Sires, DO    Family History Family History  Problem Relation Age of Onset  . Hypertension Mother   . Hypertension Father   . Diabetes Maternal Aunt   . Diabetes Maternal Uncle   . Cancer Maternal  Grandmother     Social History Social History   Tobacco Use  . Smoking status: Never Smoker  . Smokeless tobacco: Never Used  Vaping Use  . Vaping Use: Never used  Substance Use Topics  . Alcohol use: No  . Drug use: No     Allergies   Shrimp [shellfish allergy]   Review of Systems Review of Systems  All other systems reviewed and are negative.    Physical Exam Triage Vital Signs ED Triage Vitals  Enc Vitals Group     BP 05/03/20 1418 (!) 163/106     Pulse Rate 05/03/20 1418 80     Resp 05/03/20 1418 (!) 22     Temp 05/03/20 1418 98.3 F (36.8 C)     Temp Source 05/03/20 1418 Oral     SpO2 05/03/20 1418 92 %     Weight --      Height --      Head Circumference --      Peak Flow --      Pain Score 05/03/20 1422 10     Pain Loc --      Pain Edu? --      Excl. in [place]? --    No data found.  Updated Vital Signs BP (!) 163/106 (BP Location: Right Arm)   Pulse 80   Temp 98.3 F (36.8 C) (Oral)   Resp (!) 22   SpO2 92%   Visual Acuity Right Eye Distance:   Left Eye Distance:   Bilateral Distance:    Right Eye Near:   Left Eye Near:    Bilateral Near:     Physical Exam Vitals and nursing note reviewed.  Constitutional:      Appearance: He is well-developed.  HENT:     Head: Normocephalic and atraumatic.  Eyes:     Conjunctiva/sclera: Conjunctivae normal.  Cardiovascular:     Rate and Rhythm: Normal rate.     Heart sounds: No murmur heard.   Pulmonary:     Effort: Pulmonary effort is normal. No respiratory distress.  Abdominal:     Tenderness: There is no abdominal tenderness.  Musculoskeletal:     Cervical back: Neck supple.     Right lower leg: Edema present.     Left lower leg: Edema present.  Skin:    General: Skin is warm and dry.  Neurological:     Mental Status: He is alert.      UC Treatments / Results  Labs (all labs ordered are listed, but only abnormal results are displayed) Labs Reviewed - No data to  display  EKG   Radiology No results found.  Procedures Procedures (including critical care time)  Medications Ordered in UC Medications - No data to display  Initial Impression / Assessment and Plan / UC Course  I have reviewed the triage vital signs and the nursing notes.  Pertinent labs & imaging results that were available during my care of the patient were reviewed by me and considered in my medical decision making (see chart for details).     Pt also has bleeding from his left toe,  No deep cut. Small amount of blood Final Clinical Impressions(s) / UC Diagnoses   Final diagnoses:  Edema, unspecified type     Discharge Instructions     Schedule to see your Physician for recheck    ED Prescriptions    Medication Sig Dispense Auth. Provider   furosemide (LASIX) 40 MG tablet Take 1 tablet (40 mg total) by mouth daily. 30 tablet Fransico Meadow, [place]     PDMP not reviewed this encounter.  An After Visit Summary was printed and given to the patient.    Fransico Meadow, [place] 05/03/20 1503

## 2020-05-06 ENCOUNTER — Other Ambulatory Visit: Payer: Self-pay | Admitting: Family Medicine

## 2020-05-06 ENCOUNTER — Telehealth: Payer: Self-pay

## 2020-05-06 DIAGNOSIS — E11649 Type 2 diabetes mellitus with hypoglycemia without coma: Secondary | ICD-10-CM

## 2020-05-06 MED ORDER — INSULIN GLARGINE 100 UNIT/ML SOLOSTAR PEN: 20.0000 [IU] | PEN_INJECTOR | SUBCUTANEOUS | 2 refills | Status: AC

## 2020-05-06 NOTE — Telephone Encounter (Signed)
Rx sent for pens.

## 2020-05-06 NOTE — Telephone Encounter (Signed)
Patient calls nurse line requesting Lantus pen verses vial. I advised patient he needs to be seen for DM followup. I have scheduled him for October with PCP. Please send a pen if appropriate.   [employer]

## 2020-05-15 ENCOUNTER — Telehealth: Payer: Self-pay | Admitting: Family Medicine

## 2020-05-15 NOTE — Telephone Encounter (Signed)
Disability Parking Placard form dropped off for at front desk for completion.  Verified that patient section of form has been completed.  Last DOS/WCC with PCP was 01/15/20.  Placed form in team folder to be completed by clinical staff.  Paul [name]

## 2020-05-17 NOTE — Telephone Encounter (Signed)
Clinical info completed on Handicap Placard form.  Place form in Dr. Tamela Oddi box for completion.  Jeanpierre Thebeau Zimmerman Rumple, CMA

## 2020-05-17 NOTE — Telephone Encounter (Signed)
Reviewed, completed, and signed form.  Note routed to RN team inbasket and placed completed form in Clinic RN's office (wall pocket above desk).  Gordan Grell J Xenia Nile, DO  

## 2020-05-21 NOTE — Progress Notes (Signed)
SUBJECTIVE:   CHIEF COMPLAINT / HPI:   T2DM Current regimen: Metformin 1000 mg twice daily, empagliflozin 25 mg daily, Lantus 5 units QAM, just got the pen and has only used once, he was without insulin for "awhile" Last A1c on 01/15/2020 7.7, today is 8.1 On Entresto and Lipitor 40 mg Last BMP 02/22/2020 Last lipid panel 11/05/2019, LDL 60 at that time Doesn't check CBGs regularly, states it is difficult with his bipolar and PTSD Would be open to CGM  CAD and PAD Patient admitted on 7/9 for critical lower limb ischemia On 7/10 had bilateral iliofemoral thrombectomy and left lower leg fasciotomy, was eventually taken back to the OR for recanalization of left popliteal artery, posterior tibial artery, and closure of left lower extremity fasciotomy He has been following with cardiology, last seen on 9/10 by Dr. Antoine Poche Repeat echo was ordered and was planning to consider ischemia work-up if EF remains low, echo scheduled for 08/23/2020  HTN Current regimen: Coreg was increased 1 month ago to 25 mg twice daily, Entresto, Lasix 40 mg daily, spironolactone 12.5 mg daily Reports compliance with all of her medications  Bipolar and PTSD Is requesting a refill of his Abilify, states he is also taking Celexa and doing well Helps with his anxiety and "mellowing out" Reports that he does have difficulty obtaining his CBGs which he blames on bipolar and PTSD  Homeless Patient reports that he has been homeless for the last few weeks and living in his car He has a pillbox He was told that he cannot get supplementation because he is not over the age of 20    PERTINENT  PMH / PSH: HTN, CAD, PAD, cardiomyopathy, T2DM, HLD, atrial fibrillation on Eliquis patient  OBJECTIVE:   BP (!) 140/96   Pulse 100   Ht 5\' 11"  (1.803 m)   Wt 159 lb 3.2 oz (72.2 kg)   SpO2 93%   BMI 22.20 kg/m    Physical Exam:  General: 49 y.o. male in NAD Lungs: Breathing comfortably on RA Skin: warm and  dry Extremities: No edema. Ambulating with walker   ASSESSMENT/PLAN:   Diabetes mellitus type 2, uncontrolled (HCC) A1c today elevated to 8.1.  He has just restarted Lantus, therefore will not increase further at this time, especially since he is not checking his CBGs regularly.  He would be a good candidate for CGM, and he is open to this.  We will send RN a note and get him set up for this.  He has a PO Box that can be used for delivery at this time.  For now, we will continue with current regimen.  We will have him return in 6 weeks.  CAD (coronary artery disease) Along with PAD.  Following with cardiology and doing well from the standpoint.  Management per Dr. 54 and Dr. Antoine Poche.  Hypertension Continues to be slightly above goal, 140/96 today.  His Coreg was increased about 1 month ago to 25 mg twice daily.  We will go ahead and keep him at this dose.  Continue with other current medications.   PTSD (post-traumatic stress disorder) Refill of patient's Abilify provided.  He has Celexa at home.  Continue to encourage that he takes these medications as these can affect the rest of his health.  He would likely benefit from a psychiatry referral in the future.  Homelessness Overall, this is affecting patient's overall health as he is very ill with systemic illnesses and currently living in his car.  He was given resource sheet from the family medicine office for housing resources.  We will also place a CCM referral for social work to help with further resources for homelessness.     Unknown Jim, DO [facility]

## 2020-05-21 NOTE — Telephone Encounter (Signed)
Placed form in mail per patient's instructions. Copy made and placed in batch scanning.   Veronda Prude, RN

## 2020-05-22 ENCOUNTER — Encounter: Payer: Self-pay | Admitting: Family Medicine

## 2020-05-22 ENCOUNTER — Ambulatory Visit (INDEPENDENT_AMBULATORY_CARE_PROVIDER_SITE_OTHER): Payer: Medicare Other | Admitting: Family Medicine

## 2020-05-22 ENCOUNTER — Other Ambulatory Visit: Payer: Self-pay

## 2020-05-22 ENCOUNTER — Telehealth: Payer: Self-pay | Admitting: *Deleted

## 2020-05-22 VITALS — BP 140/96 | HR 100 | Ht 71.0 in | Wt 159.2 lb

## 2020-05-22 DIAGNOSIS — E1165 Type 2 diabetes mellitus with hyperglycemia: Secondary | ICD-10-CM | POA: Diagnosis not present

## 2020-05-22 DIAGNOSIS — F4310 Post-traumatic stress disorder, unspecified: Secondary | ICD-10-CM | POA: Diagnosis not present

## 2020-05-22 DIAGNOSIS — Z5900 Homelessness unspecified: Secondary | ICD-10-CM | POA: Insufficient documentation

## 2020-05-22 DIAGNOSIS — I10 Essential (primary) hypertension: Secondary | ICD-10-CM | POA: Diagnosis not present

## 2020-05-22 DIAGNOSIS — I2510 Atherosclerotic heart disease of native coronary artery without angina pectoris: Secondary | ICD-10-CM

## 2020-05-22 LAB — POCT GLYCOSYLATED HEMOGLOBIN (HGB A1C): HbA1c, POC (controlled diabetic range): 8.1 % — AB (ref 0.0–7.0)

## 2020-05-22 MED ORDER — ARIPIPRAZOLE 5 MG PO TABS: 5.0000 mg | ORAL_TABLET | Freq: Every day | ORAL | 0 refills | Status: AC

## 2020-05-22 NOTE — Telephone Encounter (Signed)
Ok for virtual visit per Dr Antoine Poche  Scheduled follow up visit, patient aware of date and time

## 2020-05-22 NOTE — Chronic Care Management (AMB) (Signed)
  Care Management   Outreach Note  05/22/2020 Name: Paul [name]. MRN: [id number] DOB: 06-27-1971  Paul [name]. is a 49 y.o. year old male who is a primary care patient of Meccariello, Solmon Ice, DO. I reached out to [employer]. by phone today in response to a referral sent by [name]'s PCP, Obie Dredge, Solmon Ice, DO.     An unsuccessful telephone outreach was attempted today. The patient was referred to the case management team for assistance with care management and care coordination.   Follow Up Plan: A HIPAA compliant phone message was left for the patient providing contact information and requesting a return call. The care management team will reach out to the patient again over the next 7 days. If patient returns call to provider office, please advise to call Embedded Care Management Care Guide Gwenevere Ghazi at [phone].  Gwenevere Ghazi  Care Guide, Embedded Care Coordination [employer] Management

## 2020-05-22 NOTE — Patient Instructions (Signed)
Thank you for coming to see me today. It was a pleasure. Today we talked about:   Take your lantus 5 u daily.  We will work on getting you a continuous glucose meter, and someone will call you about this.  When you receive the meter and supplies, you should call to make an appointment with our pharmacist, Dr. Raymondo Band by calling our office number to get it set up.  Come back to see me in 6-8 weeks.  If you have any questions or concerns, please do not hesitate to call the office at [phone].  Best,   Luis Abed, DO

## 2020-05-22 NOTE — Assessment & Plan Note (Addendum)
Refill of patient's Abilify provided.  He has Celexa at home.  Continue to encourage that he takes these medications as these can affect the rest of his health.  He would likely benefit from a psychiatry referral in the future.

## 2020-05-22 NOTE — Assessment & Plan Note (Signed)
Continues to be slightly above goal, 140/96 today.  His Coreg was increased about 1 month ago to 25 mg twice daily.  We will go ahead and keep him at this dose.  Continue with other current medications.

## 2020-05-22 NOTE — Assessment & Plan Note (Signed)
Along with PAD.  Following with cardiology and doing well from the standpoint.  Management per Dr. Antoine Poche and Dr. Myra Gianotti.

## 2020-05-22 NOTE — Assessment & Plan Note (Signed)
Overall, this is affecting patient's overall health as he is very ill with systemic illnesses and currently living in his car.  He was given resource sheet from the family medicine office for housing resources.  We will also place a CCM referral for social work to help with further resources for homelessness.

## 2020-05-22 NOTE — Assessment & Plan Note (Signed)
A1c today elevated to 8.1.  He has just restarted Lantus, therefore will not increase further at this time, especially since he is not checking his CBGs regularly.  He would be a good candidate for CGM, and he is open to this.  We will send RN a note and get him set up for this.  He has a PO Box that can be used for delivery at this time.  For now, we will continue with current regimen.  We will have him return in 6 weeks.

## 2020-05-23 ENCOUNTER — Telehealth: Payer: Self-pay

## 2020-05-23 NOTE — Telephone Encounter (Signed)
Received note from PCP requesting CGM for patient. However, I do not see where he is injecting insulin 3x daily. Please advise.

## 2020-05-30 NOTE — Chronic Care Management (AMB) (Signed)
  Chronic Care Management   Note  05/30/2020 Name: Tycen Dockter. MRN: [id number] DOB: [birth year]  Damarea Merkel. is a 49 y.o. year old male who is a primary care patient of Meccariello, Bernita Raisin, DO. I reached out to [employer]. by phone today in response to a referral sent by Mr. Mathieu Schloemer Jr.'s PCP, Meccariello, Bernita Raisin, DO.     Mr. Deskin was given information about Chronic Care Management services today including:  1. CCM service includes personalized support from designated clinical staff supervised by his physician, including individualized plan of care and coordination with other care providers 2. 24/7 contact phone numbers for assistance for urgent and routine care needs. 3. Service will only be billed when office clinical staff spend 20 minutes or more in a month to coordinate care. 4. Only one practitioner may furnish and bill the service in a calendar month. 5. The patient may stop CCM services at any time (effective at the end of the month) by phone call to the office staff. 6. The patient will be responsible for cost sharing (co-pay) of up to 20% of the service fee (after annual deductible is met).  Patient did not agree to enrollment in care management services and does not wish to consider at this time.  Follow up plan: Patient declines engagement by the care management team. Appropriate care team members and provider have been notified via electronic communication. The patient has been provided with contact information for the care management team and has been advised to call with any health related questions or concerns. If patient returns call to provider office, please advise to call [employer] at [phone].  [employer] Management

## 2020-06-04 ENCOUNTER — Telehealth: Payer: Medicare Other

## 2020-06-07 ENCOUNTER — Telehealth: Payer: Self-pay | Admitting: *Deleted

## 2020-06-07 NOTE — Telephone Encounter (Signed)
  Patient Consent for Virtual Visit         Paul [name]. has provided verbal consent on 06/07/2020 for a virtual visit (video or telephone).   CONSENT FOR VIRTUAL VISIT FOR:  Paul [name].  By participating in this virtual visit I agree to the following:  I hereby voluntarily request, consent and authorize [facility] and its employed or contracted physicians, physician assistants, nurse practitioners or other licensed health care professionals (the Practitioner), to provide me with telemedicine health care services (the "Services") as deemed necessary by the treating Practitioner. I acknowledge and consent to receive the Services by the Practitioner via telemedicine. I understand that the telemedicine visit will involve communicating with the Practitioner through live audiovisual communication technology and the disclosure of certain medical information by electronic transmission. I acknowledge that I have been given the opportunity to request an in-person assessment or other available alternative prior to the telemedicine visit and am voluntarily participating in the telemedicine visit.  I understand that I have the right to withhold or withdraw my consent to the use of telemedicine in the course of my care at any time, without affecting my right to future care or treatment, and that the Practitioner or I may terminate the telemedicine visit at any time. I understand that I have the right to inspect all information obtained and/or recorded in the course of the telemedicine visit and may receive copies of available information for a reasonable fee.  I understand that some of the potential risks of receiving the Services via telemedicine include:  Marland Kitchen Delay or interruption in medical evaluation due to technological equipment failure or disruption; . Information transmitted may not be sufficient (e.g. poor resolution of images) to allow for appropriate medical decision making by the  Practitioner; and/or  . In rare instances, security protocols could fail, causing a breach of personal health information.  Furthermore, I acknowledge that it is my responsibility to provide information about my medical history, conditions and care that is complete and accurate to the best of my ability. I acknowledge that Practitioner's advice, recommendations, and/or decision may be based on factors not within their control, such as incomplete or inaccurate data provided by me or distortions of diagnostic images or specimens that may result from electronic transmissions. I understand that the practice of medicine is not an exact science and that Practitioner makes no warranties or guarantees regarding treatment outcomes. I acknowledge that a copy of this consent can be made available to me via my patient portal [facility] MyChart), or I can request a printed copy by calling the office of [facility].    I understand that my insurance will be billed for this visit.   I have read or had this consent read to me. . I understand the contents of this consent, which adequately explains the benefits and risks of the Services being provided via telemedicine.  . I have been provided ample opportunity to ask questions regarding this consent and the Services and have had my questions answered to my satisfaction. . I give my informed consent for the services to be provided through the use of telemedicine in my medical care

## 2020-06-14 ENCOUNTER — Telehealth (INDEPENDENT_AMBULATORY_CARE_PROVIDER_SITE_OTHER): Payer: Medicare Other | Admitting: Cardiology

## 2020-06-14 DIAGNOSIS — I255 Ischemic cardiomyopathy: Secondary | ICD-10-CM | POA: Diagnosis not present

## 2020-06-14 DIAGNOSIS — E11649 Type 2 diabetes mellitus with hypoglycemia without coma: Secondary | ICD-10-CM

## 2020-06-14 DIAGNOSIS — E118 Type 2 diabetes mellitus with unspecified complications: Secondary | ICD-10-CM | POA: Diagnosis not present

## 2020-06-14 DIAGNOSIS — I70229 Atherosclerosis of native arteries of extremities with rest pain, unspecified extremity: Secondary | ICD-10-CM | POA: Diagnosis not present

## 2020-06-14 DIAGNOSIS — I429 Cardiomyopathy, unspecified: Secondary | ICD-10-CM | POA: Diagnosis not present

## 2020-06-14 DIAGNOSIS — I2510 Atherosclerotic heart disease of native coronary artery without angina pectoris: Secondary | ICD-10-CM

## 2020-06-14 MED ORDER — ASPIRIN 81 MG PO TBEC
81.0000 mg | DELAYED_RELEASE_TABLET | Freq: Every day | ORAL | 0 refills | Status: AC
Start: 2020-06-14 — End: ?

## 2020-06-14 NOTE — Patient Instructions (Addendum)
Medication Instructions:  Your physician has recommended you make the following change in your medication:  1.  Resume Asprin one tablet by mouth ( 81 mg) daily.   *If you need a refill on your cardiac medications before your next appointment, please call your pharmacy*  Lab Work: Your physician recommends that you return for lab work on November 18 between 8- 4:30 at the [facility] where Dr. Jenene Slicker office is.   If you have labs (blood work) drawn today and your tests are completely normal, you will receive your results only by: Marland Kitchen MyChart Message (if you have MyChart) OR . A paper copy in the mail If you have any lab test that is abnormal or we need to change your treatment, we will call you to review the results.   Testing/Procedures: Keep your ABI and Lower Extremity Arterial Tests on Nov 18 @ 10:00 as discussed on [employer] [address]. After or Before testing come in to the [place] office to get lab work cbc/bmet.    Follow-Up: At [facility], you and your health needs are our priority.  As part of our continuing mission to provide you with exceptional heart care, we have created designated Provider Care Teams.  These Care Teams include your primary Cardiologist (physician) and Advanced Practice Providers (APPs -  Physician Assistants and Nurse Practitioners) who all work together to provide you with the care you need, when you need it.  We recommend signing up for the patient portal called "MyChart".  Sign up information is provided on this After Visit Summary.  MyChart is used to connect with patients for Virtual Visits (Telemedicine).  Patients are able to view lab/test results, encounter notes, upcoming appointments, etc.  Non-urgent messages can be sent to your provider as well.   To learn more about what you can do with MyChart, go to [email or web address].    Your next appointment:    Your physician recommends that you keep your scheduled  follow-up appointment with  Dr,. Hochrein after your echo on Tuesday, January 25 @ 3:00 pm.     Other Instructions Your [place], [place] appointment in December has been canceled.

## 2020-06-14 NOTE — Progress Notes (Signed)
Virtual Visit via Telephone Note   This visit type was conducted due to national recommendations for restrictions regarding the COVID-19 Pandemic (e.g. social distancing) in an effort to limit this patient's exposure and mitigate transmission in our community.  Due to his co-morbid illnesses, this patient is at least at moderate risk for complications without adequate follow up.  This format is felt to be most appropriate for this patient at this time.  The patient did not have access to video technology/had technical difficulties with video requiring transitioning to audio format only (telephone).  All issues noted in this document were discussed and addressed.  No physical exam could be performed with this format.  Please refer to the patient's chart for his  consent to telehealth for [facility].    Date:  06/14/2020   ID:  Paul Hatchet., DOB March 26, 1971, MRN [id number] The patient was identified using 2 identifiers.  Patient Location: Home Provider Location: Home Office  PCP:  Cleophas Dunker, DO  Cardiologist:  Minus Breeding, MD  Electrophysiologist:  None   Evaluation Performed:  Follow-Up Visit  Chief Complaint:  Leg pain  History of Present Illness:    Paul Denherder. is a pleasant 49 y.o. male who has recently been homless with a hx of CAD going back to 2007.  Records indicate he had an MI in 2007 treated with an LAD and circumflex DES.  He had a catheterization in 2011 and again in 2013 that showed patent stents with moderate residual coronary disease.  He has had a depressed EF- 40-45% by echo in June 2019.  Other medical issues include insulin-dependent diabetes treated dyslipidemia, and history of anxiety PTSD and intermittent homelessness.     He was admitted to the hospital 02/17/2020 with leg weakness and numb, cool extremities.  He was found to have critical limb ischemia.CTA showed nearly occlusive thrombosis within bilateral femoral arteries. Dr.  Trula Slade, was consulted and performed anurgent bilateral iliofemoral thrombectomy and left lower extremity fasciotomy7/10.Unfortunately, revascularization and return of neurological function on the left was not achieved, so that evening he returned to the OR for successful recanalization of left popliteal artery, posterior tibial artery, and closure of the LLE fasciotomy.  An echocardiogram was done during that admission which showed patient now had an ejection fraction of 25% with apical akinesis.  It is felt that he had an apical thrombus that caused his lower extremity embolism.  After surgery he was placed on aspirin and Plavix, he was then placed on Eliquis 5 mg twice daily.  Discharge summary indicates that Eliquis in addition to dual antiplatelet therapy was discussed and it was decided to proceed with this.  The patient was discharged 02/16/2020.   He was seen by Dr Percival Spanish in sept 2021.  After discussion with Dr Trula Slade it was decided the patient would transition to ASA and Eliquis at the end of October.  The patient was contacted today for follow up.  He apparently is already stopped his Plavix and is on Eliquis alone.  I suggested he resume aspirin 81 mg.  He denies any chest pain.  He has chronic dyspnea on exertion but no orthopnea.  His lower extremity edema is improved.  His main complaint is pain in his legs.  Unfortunately he is currently in a homeless situation but believes he will be placed in lodging as early as today or tomorrow.  He has lower extremity Doppler scheduled for November 18.  He has been compliant with his medications.  The patient does not have symptoms concerning for COVID-19 infection (fever, chills, cough, or new shortness of breath).    Past Medical History:  Diagnosis Date  . Anxiety   . Bipolar disorder ([place])   . CAD (coronary artery disease)    Anterior MI 2007 with DES stenting of an occluded LAD, distal 70% stenosis, OM 60% stenosis, circumflex 90%  stenosis treated with a drug-eluting stent, PA 90% stenosis treated medically.  Cath January 2013 70-80% diagonal stenosis in a small vessel, OM 50% stenosis, PDA long 75% stenosis. EF 45%. He was managed medically.  . Cardiomyopathy, ischemic    EF 45%  . Diabetes mellitus   . History of chest pain 11/25/2010  . Hyperlipidemia   . Hypertension   . Myocardial infarction ([place])   . PTSD (post-traumatic stress disorder)   . Tuberculosis    1989, took pills for it    Past Surgical History:  Procedure Laterality Date  . APPLICATION OF WOUND VAC Left 02/17/2020   Procedure: APPLICATION OF WOUND VAC;  Surgeon: Serafina Mitchell, MD;  Location: [place];  Service: Vascular;  Laterality: Left;  . Catherization  08/25/11   Dr. Percival Spanish  . CORONARY ANGIOPLASTY WITH STENT PLACEMENT  207, 2011   4 stents placed  . DIRECT LARYNGOSCOPY Left 08/14/2019   Procedure: DIRECT LARYNGOSCOPY;  Surgeon: Rozetta Nunnery, MD;  Location: [place];  Service: ENT;  Laterality: Left;  . FASCIOTOMY Left 02/17/2020   Procedure: Four Compartment FASCIOTOMY;  Surgeon: Serafina Mitchell, MD;  Location: [place];  Service: Vascular;  Laterality: Left;  . FASCIOTOMY CLOSURE Left 02/17/2020   Procedure: FASCIOTOMY CLOSURE;  Surgeon: Serafina Mitchell, MD;  Location: [place];  Service: Vascular;  Laterality: Left;  . LEFT HEART CATHETERIZATION WITH CORONARY ANGIOGRAM N/A 08/25/2011   Procedure: LEFT HEART CATHETERIZATION WITH CORONARY ANGIOGRAM;  Surgeon: Minus Breeding, MD;  Location: [facility] CATH LAB;  Service: Cardiovascular;  Laterality: N/A;  . LOWER EXTREMITY ANGIOGRAM Bilateral 02/17/2020   Procedure: LOWER EXTREMITY ANGIOGRAM;  Surgeon: Serafina Mitchell, MD;  Location: [facility];  Service: Vascular;  Laterality: Bilateral;  . LOWER EXTREMITY ANGIOGRAM Left 02/17/2020   Procedure: LOWER EXTREMITY ANGIOGRAM WITH Stenting Left Popliteal Artery, Angioplasty posterior Tibial Artery;  Surgeon: Serafina Mitchell, MD;  Location: [place];  Service:  Vascular;  Laterality: Left;  Marland Kitchen MASS BIOPSY Left 08/14/2019   Procedure: BIOPSY EXCISION OF LEFT NECK MASS;  Surgeon: Rozetta Nunnery, MD;  Location: [place];  Service: ENT;  Laterality: Left;  . THROMBECTOMY FEMORAL ARTERY Bilateral 02/17/2020   Procedure: Bilateral  Iliofemoral Thrombectomy and exposure of left lower leg.;  Surgeon: Serafina Mitchell, MD;  Location: [facility] OR;  Service: Vascular;  Laterality: Bilateral;     Current Meds  Medication Sig  . apixaban (ELIQUIS) 5 MG TABS tablet Take 1 tablet (5 mg total) by mouth 2 (two) times daily.  . ARIPiprazole (ABILIFY) 5 MG tablet Take 1 tablet (5 mg total) by mouth daily.  Marland Kitchen atorvastatin (LIPITOR) 40 MG tablet Take 1 tablet (40 mg total) by mouth at bedtime.  . Blood Glucose Monitoring Suppl (ONE TOUCH ULTRA 2) W/DEVICE KIT Check sugar once daily. ICD10 E11.65  . carvedilol (COREG) 25 MG tablet Take 1 tablet (25 mg total) by mouth 2 (two) times daily with a meal.  . citalopram (CELEXA) 20 MG tablet Take 1 tablet (20 mg total) by mouth 2 (two) times daily.  . clopidogrel (PLAVIX) 75 MG tablet Take 1 tablet (  75 mg total) by mouth daily.  . Continuous Glucose Monitor KIT 1 kit by Does not apply route daily.  . empagliflozin (JARDIANCE) 25 MG TABS tablet Take 25 mg by mouth daily.  . furosemide (LASIX) 40 MG tablet Take 1 tablet (40 mg total) by mouth daily.  Marland Kitchen glucose blood (ONE TOUCH ULTRA TEST) test strip Check sugar once daily in the morning. ICD10 E11.65  . insulin glargine (LANTUS) 100 UNIT/ML Solostar Pen Inject 20 Units into the skin every morning. (Patient taking differently: Inject 5 Units into the skin every morning. )  . metFORMIN (GLUCOPHAGE) 1000 MG tablet Take 1,000 mg by mouth 2 (two) times daily.  . nitroGLYCERIN (NITROSTAT) 0.4 MG SL tablet Place 1 tablet (0.4 mg total) under the tongue every 5 (five) minutes as needed for chest pain.  . ONE TOUCH LANCETS MISC Use to check sugar once daily. ICD10 E11.65  . sacubitril-valsartan  (ENTRESTO) 24-26 MG Take 1 tablet by mouth 2 (two) times daily.  Marland Kitchen spironolactone (ALDACTONE) 25 MG tablet Take 0.5 tablets (12.5 mg total) by mouth daily.  Marland Kitchen triamcinolone cream (KENALOG) 0.1 % Apply 1 application topically 2 (two) times daily.  . [DISCONTINUED] Multiple Vitamin (MULTIVITAMIN WITH MINERALS) TABS tablet Take 1 tablet by mouth daily.  . [DISCONTINUED] oxyCODONE-acetaminophen (PERCOCET/ROXICET) 5-325 MG tablet Take 1 tablet by mouth every 6 (six) hours as needed for severe pain.     Allergies:   Shrimp [shellfish allergy]   Social History   Tobacco Use  . Smoking status: Never Smoker  . Smokeless tobacco: Never Used  Vaping Use  . Vaping Use: Never used  Substance Use Topics  . Alcohol use: No  . Drug use: No     Family Hx: The patient's family history includes Cancer in his maternal grandmother; Diabetes in his maternal aunt and maternal uncle; Hypertension in his father and mother.  ROS:   Please see the history of present illness.    All other systems reviewed and are negative.   Prior CV studies:   The following studies were reviewed today:  Echo 02/21/2020- IMPRESSIONS    1. No apical thrombus noted with the use of Definity contrast. Left  ventricular ejection fraction, by estimation, is 20 to 25%. The left  ventricle has severely decreased function. The left ventricle demonstrates  global hypokinesis.   FINDINGS  Left Ventricle: No apical thrombus noted with the use of Definity  contrast. Left ventricular ejection fraction, by estimation, is 20 to 25%.  The left ventricle has severely decreased function. The left ventricle  demonstrates global hypokinesis. Definity  contrast agent was given IV to delineate the left ventricular endocardial  borders.   Labs/Other Tests and Data Reviewed:    EKG:  An ECG dated 02/20/2020 was personally reviewed today and demonstrated:  NSR, inferior and septal Qs  Recent Labs: 07/14/2019: TSH 1.210 02/21/2020:  ALT 16 02/22/2020: BUN 9; Creatinine, Ser 1.01; Hemoglobin 9.2; Platelets 275; Potassium 3.5; Sodium 137   Recent Lipid Panel Lab Results  Component Value Date/Time   CHOL 120 11/05/2019 01:32 AM   CHOL 141 01/10/2018 02:19 PM   TRIG 210 (H) 11/05/2019 01:32 AM   HDL 18 (L) 11/05/2019 01:32 AM   HDL 24 (L) 01/10/2018 02:19 PM   CHOLHDL 6.7 11/05/2019 01:32 AM   LDLCALC 60 11/05/2019 01:32 AM   LDLCALC 72 01/10/2018 02:19 PM   LDLDIRECT 84 09/03/2011 03:48 PM    Wt Readings from Last 3 Encounters:  05/22/20 159 lb 3.2  oz (72.2 kg)  04/19/20 159 lb (72.1 kg)  03/25/20 158 lb 12.8 oz (72 kg)     Risk Assessment/Calculations:      Objective:    Vital Signs:  There were no vitals taken for this visit.   VITAL SIGNS:  reviewed  ASSESSMENT & PLAN:    Critical lower limb ischemia Secondary to emboli from apical HK Urgent LEA surgery 02/17/2020. LEA dopplers scheduled for 06/27/2020.  CAD (coronary artery disease) Anterior MI 2007 with DES stenting of an occluded LAD,  circumflex 90% stenosis treated with a drug-eluting stent  Catheterization done in January 2013 -medical Rx   Diabetes mellitus type 2, uncontrolled ([place]) IDDM- Hgb A1c-12  Cardiomyopathy [facility]) New drop in EF July 2021- EF 25% with apical AK.  He has been compliant with his medications including Entresto.  He has a follow-up echo scheduled for January, we can see him in follow-up after this.  Anticoagulated Pt placed on ASA and plavix after vascular surgery- Eliquis added for suspected apical thrombus emboli.  Going forward the patient will take Eliquis and aspirin 81 mg.  Chronic anemia Chronic microcytic anemia- Hgb 9  Homelessness- He expects to have lodging in the next day or so  Plan: Check BMP and CBC when he comes in for his dopplers.  I encouraged him to contact Dr Trula Slade for follow up.  We can see him after his echo in Jan.   COVID-19 Education: The signs and symptoms of COVID-19 were  discussed with the patient and how to seek care for testing (follow up with PCP or arrange E-visit).  The importance of social distancing was discussed today.  Time:   Today, I have spent 15 minutes with the patient with telehealth technology discussing the above problems.     Medication Adjustments/Labs and Tests Ordered: Current medicines are reviewed at length with the patient today.  Concerns regarding medicines are outlined above.   Tests Ordered: No orders of the defined types were placed in this encounter.   Medication Changes: No orders of the defined types were placed in this encounter.   Follow Up:  In Person in Jan 2022 after his echo.  Resume ASA 81 mg in addition to Eliquis. He is not currently on Plavix.   Signed, Kerin Ransom, PA-C  06/14/2020 11:40 AM    [facility]

## 2020-06-27 ENCOUNTER — Other Ambulatory Visit: Payer: Medicare Other

## 2020-06-27 ENCOUNTER — Ambulatory Visit (HOSPITAL_COMMUNITY)
Admission: RE | Admit: 2020-06-27 | Discharge: 2020-06-27 | Disposition: A | Discharge status: Home / Self Care | Payer: Medicare Other | Source: Ambulatory Visit | Attending: Physician Assistant | Admitting: Physician Assistant

## 2020-06-27 ENCOUNTER — Other Ambulatory Visit: Payer: Self-pay

## 2020-06-27 ENCOUNTER — Ambulatory Visit (INDEPENDENT_AMBULATORY_CARE_PROVIDER_SITE_OTHER): Payer: Medicare Other | Admitting: Physician Assistant

## 2020-06-27 ENCOUNTER — Ambulatory Visit (INDEPENDENT_AMBULATORY_CARE_PROVIDER_SITE_OTHER)
Admission: RE | Admit: 2020-06-27 | Discharge: 2020-06-27 | Disposition: A | Discharge status: Home / Self Care | Payer: Medicare Other | Source: Ambulatory Visit | Attending: Physician Assistant | Admitting: Physician Assistant

## 2020-06-27 DIAGNOSIS — I739 Peripheral vascular disease, unspecified: Secondary | ICD-10-CM

## 2020-06-27 DIAGNOSIS — I70229 Atherosclerosis of native arteries of extremities with rest pain, unspecified extremity: Secondary | ICD-10-CM

## 2020-06-27 DIAGNOSIS — I998 Other disorder of circulatory system: Secondary | ICD-10-CM | POA: Diagnosis not present

## 2020-06-27 DIAGNOSIS — M79606 Pain in leg, unspecified: Secondary | ICD-10-CM

## 2020-06-27 NOTE — Progress Notes (Signed)
Established Critical Limb Ischemia Patient   History of Present Illness   Paul [name]. is a 49 y.o. (08/12/1970) male who presents for surveillance of PAD.  Surgical history significant for bilateral iliofemoral thrombectomy and left tibial thrombectomy with left 4 compartment fasciotomy by Dr. Trula Slade on 02/17/2020 due to acute bilateral lower extremity ischemia.  Postoperatively Doppler signals were lost in left lower extremity and thus he was brought back to the operating room the same day and underwent left popliteal artery stenting and TP trunk angioplasty.  This was thought to be an embolic event of unknown etiology and patient was discharged on Eliquis in addition to aspirin and Plavix.  Intraoperative arteriogram also demonstrated a likely chronic occlusion of right popliteal artery.  Today he states he has been experiencing irregular nightly rest pain in right foot over the past couple weeks.  He states this keeps him up at night.  He denies any wounds of right lower extremity.  He denies any symptoms of limb symptoms of left lower extremity.  Patient states he is currently homeless and sleeping in his car.    The patient's PMH, PSH, SH, and FamHx were reviewed and are unchanged from prior visit.  Current Outpatient Medications  Medication Sig Dispense Refill  . apixaban (ELIQUIS) 5 MG TABS tablet Take 1 tablet (5 mg total) by mouth 2 (two) times daily. 180 tablet 3  . ARIPiprazole (ABILIFY) 5 MG tablet Take 1 tablet (5 mg total) by mouth daily. 30 tablet 0  . aspirin 81 MG EC tablet Take 1 tablet (81 mg total) by mouth daily. 90 tablet 0  . atorvastatin (LIPITOR) 40 MG tablet Take 1 tablet (40 mg total) by mouth at bedtime. 90 tablet 3  . Blood Glucose Monitoring Suppl (ONE TOUCH ULTRA 2) W/DEVICE KIT Check sugar once daily. ICD10 E11.65 1 each 0  . carvedilol (COREG) 25 MG tablet Take 1 tablet (25 mg total) by mouth 2 (two) times daily with a meal. 180 tablet 3  . citalopram  (CELEXA) 20 MG tablet Take 1 tablet (20 mg total) by mouth 2 (two) times daily. 180 tablet 3  . clopidogrel (PLAVIX) 75 MG tablet Take 1 tablet (75 mg total) by mouth daily. 90 tablet 3  . Continuous Glucose Monitor KIT 1 kit by Does not apply route daily. 1 kit 0  . empagliflozin (JARDIANCE) 25 MG TABS tablet Take 25 mg by mouth daily. 90 tablet 3  . furosemide (LASIX) 40 MG tablet Take 1 tablet (40 mg total) by mouth daily. 30 tablet 1  . glucose blood (ONE TOUCH ULTRA TEST) test strip Check sugar once daily in the morning. ICD10 E11.65 30 each 5  . insulin glargine (LANTUS) 100 UNIT/ML Solostar Pen Inject 20 Units into the skin every morning. (Patient taking differently: Inject 5 Units into the skin every morning. ) 3 mL 2  . metFORMIN (GLUCOPHAGE) 1000 MG tablet Take 1,000 mg by mouth 2 (two) times daily.    . nitroGLYCERIN (NITROSTAT) 0.4 MG SL tablet Place 1 tablet (0.4 mg total) under the tongue every 5 (five) minutes as needed for chest pain. 30 tablet 1  . ONE TOUCH LANCETS MISC Use to check sugar once daily. ICD10 E11.65 30 each 5  . sacubitril-valsartan (ENTRESTO) 24-26 MG Take 1 tablet by mouth 2 (two) times daily. 180 tablet 3  . spironolactone (ALDACTONE) 25 MG tablet Take 0.5 tablets (12.5 mg total) by mouth daily. 45 tablet 3  . triamcinolone cream (KENALOG)  0.1 % Apply 1 application topically 2 (two) times daily. 453.6 g 0   No current facility-administered medications for this visit.    REVIEW OF SYSTEMS (negative unless checked):   Cardiac:  []  Chest pain or chest pressure? []  Shortness of breath upon activity? []  Shortness of breath when lying flat? []  Irregular heart rhythm?  Vascular:  []  Pain in calf, thigh, or hip brought on by walking? []  Pain in feet at night that wakes you up from your sleep? []  Blood clot in your veins? []  Leg swelling?  Pulmonary:  []  Oxygen at home? []  Productive cough? []  Wheezing?  Neurologic:  []  Sudden weakness in arms or  legs? []  Sudden numbness in arms or legs? []  Sudden onset of difficult speaking or slurred speech? []  Temporary loss of vision in one eye? []  Problems with dizziness?  Gastrointestinal:  []  Blood in stool? []  Vomited blood?  Genitourinary:  []  Burning when urinating? []  Blood in urine?  Psychiatric:  []  Major depression  Hematologic:  []  Bleeding problems? []  Problems with blood clotting?  Dermatologic:  []  Rashes or ulcers?  Constitutional:  []  Fever or chills?  Ear/Nose/Throat:  []  Change in hearing? []  Nose bleeds? []  Sore throat?  Musculoskeletal:  []  Back pain? []  Joint pain? []  Muscle pain?   Physical Examination   Vitals:   06/27/20 1129  BP: (!) 149/88  Pulse: 78  Resp: 20  Temp: 98.7 F (37.1 C)  TempSrc: Temporal  SpO2: 97%  Weight: 162 lb 8 oz (73.7 kg)  Height: 5' 11"  (1.803 m)   Body mass index is 22.66 kg/m.  General:  WDWN in NAD; vital signs documented above Gait: Not observed HENT: WNL, normocephalic Pulmonary: normal non-labored breathing , without Rales, rhonchi,  wheezing Cardiac: regular HR Abdomen: soft, NT, no masses Skin: without rashes Vascular Exam/Pulses:  Right Left  Radial 2+ (normal) 2+ (normal)  Ulnar absent absent  Femoral 2+ (normal) 2+ (normal)  Popliteal absent absent  DP absent absent  PT absent absent   Extremities: Right foot without ischemic wounds; some numbness but motor intact; left medial great toe pressure-point with dry callus no ulcer Musculoskeletal: no muscle wasting or atrophy  Neurologic: A&O X 3;  No focal weakness or paresthesias are detected Psychiatric:  The pt has Normal affect.  Non-Invasive Vascular Imaging   ABI  ABI/TBIToday's ABIToday's TBIPrevious ABIPrevious TBI  +-------+-----------+-----------+------------+------------+  Right 0.57    0.23    0.64            +-------+-----------+-----------+------------+------------+  Left  1.13    0.88     1.31            Right arterial duplex demonstrates known popliteal occlusion as well as anterior tibial artery occlusion  Left arterial duplex demonstrates patent popliteal stent and patent ATA and PT; low velocity proximal to L popliteal stent   Medical Decision Making   Paul [name]. is a 48 y.o. male who presents with R foot rest pain   LLE well perfused with patent popliteal stent however there is a low velocity of 31cm/s proximal to stent  Known R popliteal occlusion now with rest pain  Plan is for aortogram with possible intervention RLE with Dr. Trula Slade; if possible, inflow of LLE popliteal stent can also be visualized  Risks and benefits were discussed with the patient and he agrees to proceed   Paul Ligas PA-C [facility] [place] Office: [phone]  Clinic MD: Oneida Alar

## 2020-06-27 NOTE — H&P (View-Only) (Signed)
Established Critical Limb Ischemia Patient   History of Present Illness   Paul [name]. is a 49 y.o. (Dec 15, 1970) male who presents for surveillance of PAD.  Surgical history significant for bilateral iliofemoral thrombectomy and left tibial thrombectomy with left 4 compartment fasciotomy by Dr. Trula Slade on 02/17/2020 due to acute bilateral lower extremity ischemia.  Postoperatively Doppler signals were lost in left lower extremity and thus he was brought back to the operating room the same day and underwent left popliteal artery stenting and TP trunk angioplasty.  This was thought to be an embolic event of unknown etiology and patient was discharged on Eliquis in addition to aspirin and Plavix.  Intraoperative arteriogram also demonstrated a likely chronic occlusion of right popliteal artery.  Today he states he has been experiencing irregular nightly rest pain in right foot over the past couple weeks.  He states this keeps him up at night.  He denies any wounds of right lower extremity.  He denies any symptoms of limb symptoms of left lower extremity.  Patient states he is currently homeless and sleeping in his car.    The patient's PMH, PSH, SH, and FamHx were reviewed and are unchanged from prior visit.  Current Outpatient Medications  Medication Sig Dispense Refill  . apixaban (ELIQUIS) 5 MG TABS tablet Take 1 tablet (5 mg total) by mouth 2 (two) times daily. 180 tablet 3  . ARIPiprazole (ABILIFY) 5 MG tablet Take 1 tablet (5 mg total) by mouth daily. 30 tablet 0  . aspirin 81 MG EC tablet Take 1 tablet (81 mg total) by mouth daily. 90 tablet 0  . atorvastatin (LIPITOR) 40 MG tablet Take 1 tablet (40 mg total) by mouth at bedtime. 90 tablet 3  . Blood Glucose Monitoring Suppl (ONE TOUCH ULTRA 2) W/DEVICE KIT Check sugar once daily. ICD10 E11.65 1 each 0  . carvedilol (COREG) 25 MG tablet Take 1 tablet (25 mg total) by mouth 2 (two) times daily with a meal. 180 tablet 3  . citalopram  (CELEXA) 20 MG tablet Take 1 tablet (20 mg total) by mouth 2 (two) times daily. 180 tablet 3  . clopidogrel (PLAVIX) 75 MG tablet Take 1 tablet (75 mg total) by mouth daily. 90 tablet 3  . Continuous Glucose Monitor KIT 1 kit by Does not apply route daily. 1 kit 0  . empagliflozin (JARDIANCE) 25 MG TABS tablet Take 25 mg by mouth daily. 90 tablet 3  . furosemide (LASIX) 40 MG tablet Take 1 tablet (40 mg total) by mouth daily. 30 tablet 1  . glucose blood (ONE TOUCH ULTRA TEST) test strip Check sugar once daily in the morning. ICD10 E11.65 30 each 5  . insulin glargine (LANTUS) 100 UNIT/ML Solostar Pen Inject 20 Units into the skin every morning. (Patient taking differently: Inject 5 Units into the skin every morning. ) 3 mL 2  . metFORMIN (GLUCOPHAGE) 1000 MG tablet Take 1,000 mg by mouth 2 (two) times daily.    . nitroGLYCERIN (NITROSTAT) 0.4 MG SL tablet Place 1 tablet (0.4 mg total) under the tongue every 5 (five) minutes as needed for chest pain. 30 tablet 1  . ONE TOUCH LANCETS MISC Use to check sugar once daily. ICD10 E11.65 30 each 5  . sacubitril-valsartan (ENTRESTO) 24-26 MG Take 1 tablet by mouth 2 (two) times daily. 180 tablet 3  . spironolactone (ALDACTONE) 25 MG tablet Take 0.5 tablets (12.5 mg total) by mouth daily. 45 tablet 3  . triamcinolone cream (KENALOG)  0.1 % Apply 1 application topically 2 (two) times daily. 453.6 g 0   No current facility-administered medications for this visit.    REVIEW OF SYSTEMS (negative unless checked):   Cardiac:  []  Chest pain or chest pressure? []  Shortness of breath upon activity? []  Shortness of breath when lying flat? []  Irregular heart rhythm?  Vascular:  []  Pain in calf, thigh, or hip brought on by walking? []  Pain in feet at night that wakes you up from your sleep? []  Blood clot in your veins? []  Leg swelling?  Pulmonary:  []  Oxygen at home? []  Productive cough? []  Wheezing?  Neurologic:  []  Sudden weakness in arms or  legs? []  Sudden numbness in arms or legs? []  Sudden onset of difficult speaking or slurred speech? []  Temporary loss of vision in one eye? []  Problems with dizziness?  Gastrointestinal:  []  Blood in stool? []  Vomited blood?  Genitourinary:  []  Burning when urinating? []  Blood in urine?  Psychiatric:  []  Major depression  Hematologic:  []  Bleeding problems? []  Problems with blood clotting?  Dermatologic:  []  Rashes or ulcers?  Constitutional:  []  Fever or chills?  Ear/Nose/Throat:  []  Change in hearing? []  Nose bleeds? []  Sore throat?  Musculoskeletal:  []  Back pain? []  Joint pain? []  Muscle pain?   Physical Examination   Vitals:   06/27/20 1129  BP: (!) 149/88  Pulse: 78  Resp: 20  Temp: 98.7 F (37.1 C)  TempSrc: Temporal  SpO2: 97%  Weight: 162 lb 8 oz (73.7 kg)  Height: 5' 11"  (1.803 m)   Body mass index is 22.66 kg/m.  General:  WDWN in NAD; vital signs documented above Gait: Not observed HENT: WNL, normocephalic Pulmonary: normal non-labored breathing , without Rales, rhonchi,  wheezing Cardiac: regular HR Abdomen: soft, NT, no masses Skin: without rashes Vascular Exam/Pulses:  Right Left  Radial 2+ (normal) 2+ (normal)  Ulnar absent absent  Femoral 2+ (normal) 2+ (normal)  Popliteal absent absent  DP absent absent  PT absent absent   Extremities: Right foot without ischemic wounds; some numbness but motor intact; left medial great toe pressure-point with dry callus no ulcer Musculoskeletal: no muscle wasting or atrophy  Neurologic: A&O X 3;  No focal weakness or paresthesias are detected Psychiatric:  The pt has Normal affect.  Non-Invasive Vascular Imaging   ABI  ABI/TBIToday's ABIToday's TBIPrevious ABIPrevious TBI  +-------+-----------+-----------+------------+------------+  Right 0.57    0.23    0.64            +-------+-----------+-----------+------------+------------+  Left  1.13    0.88     1.31            Right arterial duplex demonstrates known popliteal occlusion as well as anterior tibial artery occlusion  Left arterial duplex demonstrates patent popliteal stent and patent ATA and PT; low velocity proximal to L popliteal stent   Medical Decision Making   Paul [name]. is a 49 y.o. male who presents with R foot rest pain   LLE well perfused with patent popliteal stent however there is a low velocity of 31cm/s proximal to stent  Known R popliteal occlusion now with rest pain  Plan is for aortogram with possible intervention RLE with Dr. Trula Slade; if possible, inflow of LLE popliteal stent can also be visualized  Risks and benefits were discussed with the patient and he agrees to proceed   Dagoberto Ligas PA-C [facility] [place] Office: [phone]  Clinic MD: Oneida Alar

## 2020-06-28 DIAGNOSIS — E118 Type 2 diabetes mellitus with unspecified complications: Secondary | ICD-10-CM | POA: Diagnosis not present

## 2020-06-28 DIAGNOSIS — I255 Ischemic cardiomyopathy: Secondary | ICD-10-CM | POA: Diagnosis not present

## 2020-06-28 LAB — CBC WITH DIFFERENTIAL/PLATELET
Basophils Absolute: 0 10*3/uL (ref 0.0–0.2)
Basos: 0 %
EOS (ABSOLUTE): 0 10*3/uL (ref 0.0–0.4)
Eos: 0 %
Hematocrit: 33.4 % — ABNORMAL LOW (ref 37.5–51.0)
Hemoglobin: 9.1 g/dL — ABNORMAL LOW (ref 13.0–17.7)
Immature Grans (Abs): 0 10*3/uL (ref 0.0–0.1)
Immature Granulocytes: 0 %
Lymphocytes Absolute: 1.3 10*3/uL (ref 0.7–3.1)
Lymphs: 27 %
MCH: 18.6 pg — ABNORMAL LOW (ref 26.6–33.0)
MCHC: 27.2 g/dL — ABNORMAL LOW (ref 31.5–35.7)
MCV: 68 fL — ABNORMAL LOW (ref 79–97)
Monocytes Absolute: 0.6 10*3/uL (ref 0.1–0.9)
Monocytes: 12 %
NRBC: 1 % — ABNORMAL HIGH (ref 0–0)
Neutrophils Absolute: 3 10*3/uL (ref 1.4–7.0)
Neutrophils: 61 %
Platelets: 335 10*3/uL (ref 150–450)
RBC: 4.9 x10E6/uL (ref 4.14–5.80)
RDW: 21.1 % — ABNORMAL HIGH (ref 11.6–15.4)
WBC: 4.9 10*3/uL (ref 3.4–10.8)

## 2020-06-28 LAB — BASIC METABOLIC PANEL
BUN/Creatinine Ratio: 10 (ref 9–20)
BUN: 9 mg/dL (ref 6–24)
CO2: 24 mmol/L (ref 20–29)
Calcium: 8.7 mg/dL (ref 8.7–10.2)
Chloride: 98 mmol/L (ref 96–106)
Creatinine, Ser: 0.94 mg/dL (ref 0.76–1.27)
GFR calc Af Amer: 110 mL/min/{1.73_m2} (ref >59)
GFR calc non Af Amer: 95 mL/min/{1.73_m2} (ref >59)
Glucose: 119 mg/dL — ABNORMAL HIGH (ref 65–99)
Potassium: 3.8 mmol/L (ref 3.5–5.2)
Sodium: 136 mmol/L (ref 134–144)

## 2020-07-05 ENCOUNTER — Other Ambulatory Visit (HOSPITAL_COMMUNITY)
Admission: RE | Admit: 2020-07-05 | Discharge: 2020-07-05 | Disposition: A | Discharge status: Home / Self Care | Payer: Medicare Other | Source: Ambulatory Visit | Attending: Surgery | Admitting: Surgery

## 2020-07-05 DIAGNOSIS — Z01812 Encounter for preprocedural laboratory examination: Secondary | ICD-10-CM | POA: Diagnosis not present

## 2020-07-05 DIAGNOSIS — Z20822 Contact with and (suspected) exposure to covid-19: Secondary | ICD-10-CM | POA: Insufficient documentation

## 2020-07-05 LAB — SARS CORONAVIRUS 2 (TAT 6-24 HRS): SARS Coronavirus 2: NEGATIVE

## 2020-07-09 ENCOUNTER — Ambulatory Visit (HOSPITAL_BASED_OUTPATIENT_CLINIC_OR_DEPARTMENT_OTHER): Payer: Medicare Other

## 2020-07-09 ENCOUNTER — Inpatient Hospital Stay (HOSPITAL_COMMUNITY)
Admission: RE | Admit: 2020-07-09 | Discharge: 2020-07-19 | DRG: 253 | Disposition: A | Discharge status: Skilled Nursing Facility | Payer: Medicare Other | Attending: Surgery | Admitting: Surgery

## 2020-07-09 ENCOUNTER — Other Ambulatory Visit: Payer: Self-pay

## 2020-07-09 ENCOUNTER — Encounter (HOSPITAL_COMMUNITY)
Admission: RE | Disposition: A | Discharge status: Skilled Nursing Facility | Payer: Self-pay | Source: Home / Self Care | Attending: Surgery

## 2020-07-09 ENCOUNTER — Encounter (HOSPITAL_COMMUNITY): Payer: Self-pay | Admitting: Surgery

## 2020-07-09 DIAGNOSIS — I998 Other disorder of circulatory system: Secondary | ICD-10-CM

## 2020-07-09 DIAGNOSIS — Z5902 Unsheltered homelessness: Secondary | ICD-10-CM

## 2020-07-09 DIAGNOSIS — F319 Bipolar disorder, unspecified: Secondary | ICD-10-CM | POA: Diagnosis present

## 2020-07-09 DIAGNOSIS — F4310 Post-traumatic stress disorder, unspecified: Secondary | ICD-10-CM | POA: Diagnosis present

## 2020-07-09 DIAGNOSIS — T8131XA Disruption of external operation (surgical) wound, not elsewhere classified, initial encounter: Secondary | ICD-10-CM | POA: Diagnosis present

## 2020-07-09 DIAGNOSIS — I110 Hypertensive heart disease with heart failure: Secondary | ICD-10-CM | POA: Diagnosis present

## 2020-07-09 DIAGNOSIS — I739 Peripheral vascular disease, unspecified: Secondary | ICD-10-CM | POA: Diagnosis present

## 2020-07-09 DIAGNOSIS — Z8249 Family history of ischemic heart disease and other diseases of the circulatory system: Secondary | ICD-10-CM

## 2020-07-09 DIAGNOSIS — I7092 Chronic total occlusion of artery of the extremities: Secondary | ICD-10-CM | POA: Diagnosis present

## 2020-07-09 DIAGNOSIS — I70229 Atherosclerosis of native arteries of extremities with rest pain, unspecified extremity: Secondary | ICD-10-CM | POA: Diagnosis present

## 2020-07-09 DIAGNOSIS — I480 Paroxysmal atrial fibrillation: Secondary | ICD-10-CM | POA: Diagnosis present

## 2020-07-09 DIAGNOSIS — E1151 Type 2 diabetes mellitus with diabetic peripheral angiopathy without gangrene: Secondary | ICD-10-CM | POA: Diagnosis present

## 2020-07-09 DIAGNOSIS — Z955 Presence of coronary angioplasty implant and graft: Secondary | ICD-10-CM

## 2020-07-09 DIAGNOSIS — D509 Iron deficiency anemia, unspecified: Secondary | ICD-10-CM | POA: Diagnosis present

## 2020-07-09 DIAGNOSIS — M79606 Pain in leg, unspecified: Secondary | ICD-10-CM

## 2020-07-09 DIAGNOSIS — R001 Bradycardia, unspecified: Secondary | ICD-10-CM | POA: Diagnosis present

## 2020-07-09 DIAGNOSIS — Z8616 Personal history of COVID-19: Secondary | ICD-10-CM

## 2020-07-09 DIAGNOSIS — Z7901 Long term (current) use of anticoagulants: Secondary | ICD-10-CM

## 2020-07-09 DIAGNOSIS — Z79899 Other long term (current) drug therapy: Secondary | ICD-10-CM

## 2020-07-09 DIAGNOSIS — I9581 Postprocedural hypotension: Secondary | ICD-10-CM | POA: Diagnosis not present

## 2020-07-09 DIAGNOSIS — I70223 Atherosclerosis of native arteries of extremities with rest pain, bilateral legs: Principal | ICD-10-CM | POA: Diagnosis present

## 2020-07-09 DIAGNOSIS — I5022 Chronic systolic (congestive) heart failure: Secondary | ICD-10-CM | POA: Diagnosis present

## 2020-07-09 DIAGNOSIS — Z7982 Long term (current) use of aspirin: Secondary | ICD-10-CM

## 2020-07-09 DIAGNOSIS — I2510 Atherosclerotic heart disease of native coronary artery without angina pectoris: Secondary | ICD-10-CM | POA: Diagnosis present

## 2020-07-09 DIAGNOSIS — I959 Hypotension, unspecified: Secondary | ICD-10-CM

## 2020-07-09 DIAGNOSIS — Z7902 Long term (current) use of antithrombotics/antiplatelets: Secondary | ICD-10-CM

## 2020-07-09 DIAGNOSIS — Z794 Long term (current) use of insulin: Secondary | ICD-10-CM

## 2020-07-09 DIAGNOSIS — Z01810 Encounter for preprocedural cardiovascular examination: Secondary | ICD-10-CM

## 2020-07-09 DIAGNOSIS — I071 Rheumatic tricuspid insufficiency: Secondary | ICD-10-CM | POA: Diagnosis present

## 2020-07-09 DIAGNOSIS — I252 Old myocardial infarction: Secondary | ICD-10-CM

## 2020-07-09 DIAGNOSIS — D62 Acute posthemorrhagic anemia: Secondary | ICD-10-CM | POA: Diagnosis present

## 2020-07-09 DIAGNOSIS — Z7984 Long term (current) use of oral hypoglycemic drugs: Secondary | ICD-10-CM

## 2020-07-09 DIAGNOSIS — E7800 Pure hypercholesterolemia, unspecified: Secondary | ICD-10-CM | POA: Diagnosis present

## 2020-07-09 HISTORY — PX: ABDOMINAL AORTAGRAM: SHX5706

## 2020-07-09 HISTORY — DX: Peripheral vascular disease, unspecified: I73.9

## 2020-07-09 HISTORY — PX: ABDOMINAL AORTOGRAM W/LOWER EXTREMITY: CATH118223

## 2020-07-09 HISTORY — DX: COVID-19: U07.1

## 2020-07-09 LAB — POCT I-STAT, CHEM 8
BUN: 12 mg/dL (ref 6–20)
Calcium, Ion: 1.09 mmol/L — ABNORMAL LOW (ref 1.15–1.40)
Chloride: 101 mmol/L (ref 98–111)
Creatinine, Ser: 1 mg/dL (ref 0.61–1.24)
Glucose, Bld: 163 mg/dL — ABNORMAL HIGH (ref 70–99)
HCT: 34 % — ABNORMAL LOW (ref 39.0–52.0)
Hemoglobin: 11.6 g/dL — ABNORMAL LOW (ref 13.0–17.0)
Potassium: 3.8 mmol/L (ref 3.5–5.1)
Sodium: 139 mmol/L (ref 135–145)
TCO2: 24 mmol/L (ref 22–32)

## 2020-07-09 LAB — HEMOGLOBIN A1C
Hgb A1c MFr Bld: 8.2 % — ABNORMAL HIGH (ref 4.8–5.6)
Mean Plasma Glucose: 188.64 mg/dL

## 2020-07-09 LAB — GLUCOSE, CAPILLARY
Glucose-Capillary: 139 mg/dL — ABNORMAL HIGH (ref 70–99)
Glucose-Capillary: 105 mg/dL — ABNORMAL HIGH (ref 70–99)
Glucose-Capillary: 204 mg/dL — ABNORMAL HIGH (ref 70–99)
Glucose-Capillary: 146 mg/dL — ABNORMAL HIGH (ref 70–99)

## 2020-07-09 SURGERY — ABDOMINAL AORTOGRAM W/LOWER EXTREMITY
Anesthesia: LOCAL | Laterality: Right

## 2020-07-09 MED ORDER — SODIUM CHLORIDE 0.9 % WEIGHT BASED INFUSION: 1.0000 mL/kg/h | INTRAVENOUS | Status: AC

## 2020-07-09 MED ORDER — HEPARIN (PORCINE) IN NACL 1000-0.9 UT/500ML-% IV SOLN
INTRAVENOUS | Status: DC | PRN
  Administered 2020-07-09 (×2): 500 mL

## 2020-07-09 MED ORDER — FENTANYL CITRATE (PF) 100 MCG/2ML IJ SOLN
INTRAMUSCULAR | Status: DC | PRN
  Administered 2020-07-09: 50 ug via INTRAVENOUS

## 2020-07-09 MED ORDER — SODIUM CHLORIDE 0.9% FLUSH: 3.0000 mL | Freq: Two times a day (BID) | INTRAVENOUS | Status: DC

## 2020-07-09 MED ORDER — ASPIRIN EC 81 MG PO TBEC
81.0000 mg | DELAYED_RELEASE_TABLET | Freq: Every day | ORAL | Status: DC
  Filled 2020-07-09: qty 1

## 2020-07-09 MED ORDER — LABETALOL HCL 5 MG/ML IV SOLN
10.0000 mg | INTRAVENOUS | Status: DC | PRN
  Administered 2020-07-09: 10 mg via INTRAVENOUS
  Filled 2020-07-09: qty 4

## 2020-07-09 MED ORDER — SODIUM CHLORIDE 0.9 % IV SOLN: INTRAVENOUS | Status: DC

## 2020-07-09 MED ORDER — MIDAZOLAM HCL 2 MG/2ML IJ SOLN
INTRAMUSCULAR | Status: AC
  Filled 2020-07-09: qty 2

## 2020-07-09 MED ORDER — LIDOCAINE HCL (PF) 1 % IJ SOLN
INTRAMUSCULAR | Status: DC | PRN
  Administered 2020-07-09: 15 mL via INTRADERMAL

## 2020-07-09 MED ORDER — INSULIN ASPART 100 UNIT/ML ~~LOC~~ SOLN: 0.0000 [IU] | Freq: Three times a day (TID) | SUBCUTANEOUS | Status: DC

## 2020-07-09 MED ORDER — IODIXANOL 320 MG/ML IV SOLN
INTRAVENOUS | Status: DC | PRN
  Administered 2020-07-09: 130 mL

## 2020-07-09 MED ORDER — MIDAZOLAM HCL 2 MG/2ML IJ SOLN
INTRAMUSCULAR | Status: DC | PRN
  Administered 2020-07-09: 2 mg via INTRAVENOUS

## 2020-07-09 MED ORDER — HYDRALAZINE HCL 20 MG/ML IJ SOLN: 5.0000 mg | INTRAMUSCULAR | Status: DC | PRN

## 2020-07-09 MED ORDER — FENTANYL CITRATE (PF) 100 MCG/2ML IJ SOLN
INTRAMUSCULAR | Status: AC
  Filled 2020-07-09: qty 2

## 2020-07-09 MED ORDER — MORPHINE SULFATE (PF) 4 MG/ML IV SOLN: 2.0000 mg | INTRAVENOUS | Status: DC | PRN

## 2020-07-09 MED ORDER — SODIUM CHLORIDE 0.9% FLUSH: 3.0000 mL | INTRAVENOUS | Status: DC | PRN

## 2020-07-09 MED ORDER — SODIUM CHLORIDE 0.9 % IV SOLN: 250.0000 mL | INTRAVENOUS | Status: DC | PRN

## 2020-07-09 MED ORDER — MORPHINE SULFATE (PF) 2 MG/ML IV SOLN: 2.0000 mg | INTRAVENOUS | Status: DC | PRN

## 2020-07-09 MED ORDER — OXYCODONE HCL 5 MG PO TABS
5.0000 mg | ORAL_TABLET | ORAL | Status: DC | PRN
  Administered 2020-07-10: 5 mg via ORAL
  Filled 2020-07-09: qty 1

## 2020-07-09 MED ORDER — ACETAMINOPHEN 325 MG PO TABS: 650.0000 mg | ORAL_TABLET | ORAL | Status: DC | PRN

## 2020-07-09 MED ORDER — IODIXANOL 320 MG/ML IV SOLN
INTRAVENOUS | Status: DC | PRN
  Administered 2020-07-09: 130 mL via INTRA_ARTERIAL

## 2020-07-09 MED ORDER — ONDANSETRON HCL 4 MG/2ML IJ SOLN: 4.0000 mg | Freq: Four times a day (QID) | INTRAMUSCULAR | Status: DC | PRN

## 2020-07-09 MED ORDER — HEPARIN (PORCINE) IN NACL 1000-0.9 UT/500ML-% IV SOLN
INTRAVENOUS | Status: AC
  Filled 2020-07-09: qty 1000

## 2020-07-09 MED ORDER — LIDOCAINE HCL (PF) 1 % IJ SOLN
INTRAMUSCULAR | Status: AC
  Filled 2020-07-09: qty 30

## 2020-07-09 SURGICAL SUPPLY — 9 items
CATH OMNI FLUSH 5F 65CM (CATHETERS) ×1 IMPLANT
KIT MICROPUNCTURE NIT STIFF (SHEATH) ×1 IMPLANT
KIT PV (KITS) ×2 IMPLANT
SHEATH PINNACLE 5F 10CM (SHEATH) ×1 IMPLANT
SHEATH PROBE COVER 6X72 (BAG) ×1 IMPLANT
SYR MEDRAD MARK V 150ML (SYRINGE) ×1 IMPLANT
TRANSDUCER W/STOPCOCK (MISCELLANEOUS) ×2 IMPLANT
TRAY PV CATH (CUSTOM PROCEDURE TRAY) ×2 IMPLANT
WIRE BENTSON .035X145CM (WIRE) ×1 IMPLANT

## 2020-07-09 NOTE — Interval H&P Note (Signed)
History and Physical Interval Note:  07/09/2020 8:33 AM  Paul [name].  has presented today for surgery, with the diagnosis of rest pain.  The various methods of treatment have been discussed with the patient and family. After consideration of risks, benefits and other options for treatment, the patient has consented to  Procedure(s): ABDOMINAL AORTOGRAM W/LOWER EXTREMITY (Right) as a surgical intervention.  The patient's history has been reviewed, patient examined, no change in status, stable for surgery.  I have reviewed the patient's chart and labs.  Questions were answered to the patient's satisfaction.     Durene Cal

## 2020-07-09 NOTE — Op Note (Signed)
    Patient name: Paul [name]. MRN: [id number] DOB: [birth year] Sex: male  07/09/2020 Pre-operative Diagnosis: Bilateral wrist pain Post-operative diagnosis:  Same Surgeon:  Durene Cal Procedure Performed:  1.  Ultrasound-guided access, left femoral artery  2.  Abdominal aortogram  3.  Second-order catheterization  4.  Bilateral lower extremity runoff  5.  Conscious sedation, 16 minutes   Indications: The patient comes in for angiography.  He has previously undergone bilateral embolectomy and stent to the left popliteal artery.  He is having rest pain  Procedure:  The patient was identified in the holding area and taken to room 8.  The patient was then placed supine on the table and prepped and draped in the usual sterile fashion.  A time out was called.  Conscious sedation was administered with the use of IV fentanyl and Versed under continuous physician and nurse monitoring.  Heart rate, blood pressure, and oxygen saturation were continuously monitored.  Total sedation time was 16 minutes.  Ultrasound was used to evaluate the left common femoral artery.  It was patent .  A digital ultrasound image was acquired.  A micropuncture needle was used to access the left common femoral artery under ultrasound guidance.  An 018 wire was advanced without resistance and a micropuncture sheath was placed.  The 018 wire was removed and a benson wire was placed.  The micropuncture sheath was exchanged for a 5 french sheath.  An omniflush catheter was advanced over the wire to the level of L-1.  An abdominal angiogram was obtained.  Next, using the omniflush catheter and a benson wire, the aortic bifurcation was crossed and the catheter was placed into theright external iliac artery and right runoff was obtained.  left runoff was performed via retrograde sheath injections.  Findings:   Aortogram: No significant renal artery stenosis was identified.  The infrarenal abdominal aorta is widely patent  without stenosis.  Bilateral common and external iliac arteries are widely patent.  Right Lower Extremity: The right common femoral and profundofemoral artery are widely patent.  There is mild calcific disease in the adductor canal.  The popliteal artery occludes above the knee with reconstitution of the distal below-knee popliteal artery and single-vessel runoff via the posterior tibial artery  Left Lower Extremity: The left common femoral and profundofemoral artery are widely patent.  The superficial femoral artery is patent.  There is a stent within the popliteal artery that ends just above the joint space.  At the joint space there is a high-grade approximate 80% focal stenosis.  There is single-vessel runoff via the posterior tibial artery  Intervention: None  Impression:  #1  High-grade left popliteal artery stenosis  #2  Occluded right popliteal artery  #3  Single-vessel runoff bilaterally via the posterior tibial artery  #4  We will undergo right femoral-popliteal bypass grafting and percutaneous intervention to the left popliteal artery     V. Durene Cal, M.D., [facility] [place] Office: [phone] Pager:  [phone]

## 2020-07-09 NOTE — Progress Notes (Signed)
Vein mapping  has been completed. Refer to [facility] under chart review to view preliminary results.   07/09/2020  3:58 PM Paul [name], Paul [name]

## 2020-07-09 NOTE — Progress Notes (Signed)
Site: Left femoral Sheath Size: 72fr - venous Condition prior to removal:  Level 0 Type of pressure held: manual Time pressure held: 15 minutes Status of patient during pull: stable Condition of site post pull:  Clean, dry, no hematoma Level 0 post pull Type of dressing applied: transparent Pulses verified: left PT dopplered Patient's condition post pull: stable Bedrest begins at: 1100 Post instructions given to patient: yes

## 2020-07-09 NOTE — Progress Notes (Signed)
Left groin pressure held for 20 minutes. No apparent complications. Groin level 0, bilateral posterior tibial pulses heard by doppler. Patient understands downtime instructions.

## 2020-07-09 NOTE — Anesthesia Preprocedure Evaluation (Addendum)
Anesthesia Evaluation  Patient identified by MRN, date of birth, ID band Patient awake    Reviewed: Allergy & Precautions, NPO status , Patient's Chart, lab work & pertinent test results, reviewed documented beta blocker date and time   Airway Mallampati: II  TM Distance: >3 FB Neck ROM: Full    Dental  (+) Teeth Intact   Pulmonary neg pulmonary ROS,    Pulmonary exam normal breath sounds clear to auscultation       Cardiovascular hypertension, Pt. on home beta blockers and Pt. on medications + CAD, + Past MI, + Cardiac Stents, + Peripheral Vascular Disease and +CHF  Normal cardiovascular exam Rhythm:Regular Rate:Normal  Echo 02/21/20: 1. No apical thrombus noted with the use of Definity contrast. Left  ventricular ejection fraction, by estimation, is 20 to 25%. The left  ventricle has severely decreased function. The left ventricle demonstrates  global hypokinesis.    Neuro/Psych PSYCHIATRIC DISORDERS Anxiety Bipolar Disorder negative neurological ROS     GI/Hepatic negative GI ROS, Neg liver ROS,   Endo/Other  diabetes, Type 2, Insulin Dependent, Oral Hypoglycemic Agents  Renal/GU negative Renal ROS     Musculoskeletal negative musculoskeletal ROS (+)   Abdominal   Peds  Hematology  (+) Blood dyscrasia (Eliquis), anemia ,   Anesthesia Other Findings   Reproductive/Obstetrics                            Anesthesia Physical Anesthesia Plan  ASA: IV  Anesthesia Plan: General   Post-op Pain Management:    Induction: Intravenous  PONV Risk Score and Plan: 2 and Midazolam, Dexamethasone and Ondansetron  Airway Management Planned: Oral ETT  Additional Equipment: Arterial line  Intra-op Plan:   Post-operative Plan: Extubation in OR  Informed Consent: I have reviewed the patients History and Physical, chart, labs and discussed the procedure including the risks, benefits and  alternatives for the proposed anesthesia with the patient or authorized representative who has indicated his/her understanding and acceptance.       Plan Discussed with: CRNA  Anesthesia Plan Comments: (2 large bore PIV vs CVL)       Anesthesia Quick Evaluation

## 2020-07-10 ENCOUNTER — Ambulatory Visit (HOSPITAL_COMMUNITY): Payer: Medicare Other | Admitting: Anesthesiology

## 2020-07-10 ENCOUNTER — Encounter (HOSPITAL_COMMUNITY): Payer: Self-pay | Admitting: Surgery

## 2020-07-10 ENCOUNTER — Encounter (HOSPITAL_COMMUNITY)
Admission: RE | Disposition: A | Discharge status: Skilled Nursing Facility | Payer: Self-pay | Source: Home / Self Care | Attending: Surgery

## 2020-07-10 ENCOUNTER — Ambulatory Visit (HOSPITAL_COMMUNITY): Payer: Medicare Other

## 2020-07-10 DIAGNOSIS — I70223 Atherosclerosis of native arteries of extremities with rest pain, bilateral legs: Secondary | ICD-10-CM | POA: Diagnosis present

## 2020-07-10 DIAGNOSIS — I5022 Chronic systolic (congestive) heart failure: Secondary | ICD-10-CM | POA: Diagnosis present

## 2020-07-10 DIAGNOSIS — Z7901 Long term (current) use of anticoagulants: Secondary | ICD-10-CM | POA: Diagnosis not present

## 2020-07-10 DIAGNOSIS — I5020 Unspecified systolic (congestive) heart failure: Secondary | ICD-10-CM | POA: Diagnosis present

## 2020-07-10 DIAGNOSIS — R001 Bradycardia, unspecified: Secondary | ICD-10-CM | POA: Diagnosis present

## 2020-07-10 DIAGNOSIS — I70222 Atherosclerosis of native arteries of extremities with rest pain, left leg: Secondary | ICD-10-CM | POA: Diagnosis not present

## 2020-07-10 DIAGNOSIS — R2689 Other abnormalities of gait and mobility: Secondary | ICD-10-CM | POA: Diagnosis present

## 2020-07-10 DIAGNOSIS — R41841 Cognitive communication deficit: Secondary | ICD-10-CM | POA: Diagnosis present

## 2020-07-10 DIAGNOSIS — I7092 Chronic total occlusion of artery of the extremities: Secondary | ICD-10-CM | POA: Diagnosis present

## 2020-07-10 DIAGNOSIS — Z955 Presence of coronary angioplasty implant and graft: Secondary | ICD-10-CM | POA: Diagnosis not present

## 2020-07-10 DIAGNOSIS — I10 Essential (primary) hypertension: Secondary | ICD-10-CM | POA: Diagnosis present

## 2020-07-10 DIAGNOSIS — I998 Other disorder of circulatory system: Secondary | ICD-10-CM | POA: Diagnosis present

## 2020-07-10 DIAGNOSIS — Z7902 Long term (current) use of antithrombotics/antiplatelets: Secondary | ICD-10-CM | POA: Diagnosis not present

## 2020-07-10 DIAGNOSIS — I252 Old myocardial infarction: Secondary | ICD-10-CM | POA: Diagnosis not present

## 2020-07-10 DIAGNOSIS — I2510 Atherosclerotic heart disease of native coronary artery without angina pectoris: Secondary | ICD-10-CM | POA: Diagnosis present

## 2020-07-10 DIAGNOSIS — D62 Acute posthemorrhagic anemia: Secondary | ICD-10-CM | POA: Diagnosis present

## 2020-07-10 DIAGNOSIS — I480 Paroxysmal atrial fibrillation: Secondary | ICD-10-CM | POA: Diagnosis present

## 2020-07-10 DIAGNOSIS — M6281 Muscle weakness (generalized): Secondary | ICD-10-CM | POA: Diagnosis present

## 2020-07-10 DIAGNOSIS — M79606 Pain in leg, unspecified: Secondary | ICD-10-CM | POA: Diagnosis present

## 2020-07-10 DIAGNOSIS — I361 Nonrheumatic tricuspid (valve) insufficiency: Secondary | ICD-10-CM | POA: Diagnosis not present

## 2020-07-10 DIAGNOSIS — Z5902 Unsheltered homelessness: Secondary | ICD-10-CM | POA: Diagnosis not present

## 2020-07-10 DIAGNOSIS — I110 Hypertensive heart disease with heart failure: Secondary | ICD-10-CM | POA: Diagnosis present

## 2020-07-10 DIAGNOSIS — E7800 Pure hypercholesterolemia, unspecified: Secondary | ICD-10-CM | POA: Diagnosis present

## 2020-07-10 DIAGNOSIS — Z7401 Bed confinement status: Secondary | ICD-10-CM | POA: Diagnosis not present

## 2020-07-10 DIAGNOSIS — I9581 Postprocedural hypotension: Secondary | ICD-10-CM | POA: Diagnosis not present

## 2020-07-10 DIAGNOSIS — I071 Rheumatic tricuspid insufficiency: Secondary | ICD-10-CM | POA: Diagnosis present

## 2020-07-10 DIAGNOSIS — D509 Iron deficiency anemia, unspecified: Secondary | ICD-10-CM | POA: Diagnosis present

## 2020-07-10 DIAGNOSIS — I9589 Other hypotension: Secondary | ICD-10-CM | POA: Diagnosis not present

## 2020-07-10 DIAGNOSIS — I70229 Atherosclerosis of native arteries of extremities with rest pain, unspecified extremity: Secondary | ICD-10-CM | POA: Diagnosis not present

## 2020-07-10 DIAGNOSIS — Z7984 Long term (current) use of oral hypoglycemic drugs: Secondary | ICD-10-CM | POA: Diagnosis not present

## 2020-07-10 DIAGNOSIS — I5043 Acute on chronic combined systolic (congestive) and diastolic (congestive) heart failure: Secondary | ICD-10-CM | POA: Diagnosis not present

## 2020-07-10 DIAGNOSIS — R079 Chest pain, unspecified: Secondary | ICD-10-CM | POA: Diagnosis not present

## 2020-07-10 DIAGNOSIS — M2550 Pain in unspecified joint: Secondary | ICD-10-CM | POA: Diagnosis not present

## 2020-07-10 DIAGNOSIS — F4310 Post-traumatic stress disorder, unspecified: Secondary | ICD-10-CM | POA: Diagnosis present

## 2020-07-10 DIAGNOSIS — I5042 Chronic combined systolic (congestive) and diastolic (congestive) heart failure: Secondary | ICD-10-CM | POA: Diagnosis not present

## 2020-07-10 DIAGNOSIS — R0789 Other chest pain: Secondary | ICD-10-CM | POA: Diagnosis not present

## 2020-07-10 DIAGNOSIS — E1151 Type 2 diabetes mellitus with diabetic peripheral angiopathy without gangrene: Secondary | ICD-10-CM | POA: Diagnosis present

## 2020-07-10 DIAGNOSIS — R9431 Abnormal electrocardiogram [ECG] [EKG]: Secondary | ICD-10-CM | POA: Diagnosis not present

## 2020-07-10 DIAGNOSIS — T8131XA Disruption of external operation (surgical) wound, not elsewhere classified, initial encounter: Secondary | ICD-10-CM | POA: Diagnosis present

## 2020-07-10 DIAGNOSIS — I739 Peripheral vascular disease, unspecified: Secondary | ICD-10-CM | POA: Diagnosis present

## 2020-07-10 DIAGNOSIS — Z8616 Personal history of COVID-19: Secondary | ICD-10-CM | POA: Diagnosis not present

## 2020-07-10 DIAGNOSIS — I340 Nonrheumatic mitral (valve) insufficiency: Secondary | ICD-10-CM | POA: Diagnosis not present

## 2020-07-10 DIAGNOSIS — Z7982 Long term (current) use of aspirin: Secondary | ICD-10-CM | POA: Diagnosis not present

## 2020-07-10 DIAGNOSIS — F319 Bipolar disorder, unspecified: Secondary | ICD-10-CM | POA: Diagnosis present

## 2020-07-10 DIAGNOSIS — E0840 Diabetes mellitus due to underlying condition with diabetic neuropathy, unspecified: Secondary | ICD-10-CM | POA: Diagnosis present

## 2020-07-10 DIAGNOSIS — F339 Major depressive disorder, recurrent, unspecified: Secondary | ICD-10-CM | POA: Diagnosis present

## 2020-07-10 DIAGNOSIS — E785 Hyperlipidemia, unspecified: Secondary | ICD-10-CM | POA: Diagnosis present

## 2020-07-10 HISTORY — PX: FEMORAL-POPLITEAL BYPASS GRAFT: SHX937

## 2020-07-10 HISTORY — PX: LOWER EXTREMITY ANGIOGRAM: SHX5508

## 2020-07-10 HISTORY — PX: ANGIOPLASTY ILLIAC ARTERY: SHX5720

## 2020-07-10 LAB — GLUCOSE, CAPILLARY
Glucose-Capillary: 61 mg/dL — ABNORMAL LOW (ref 70–99)
Glucose-Capillary: 55 mg/dL — ABNORMAL LOW (ref 70–99)
Glucose-Capillary: 46 mg/dL — ABNORMAL LOW (ref 70–99)
Glucose-Capillary: 185 mg/dL — ABNORMAL HIGH (ref 70–99)
Glucose-Capillary: 195 mg/dL — ABNORMAL HIGH (ref 70–99)

## 2020-07-10 LAB — SURGICAL PCR SCREEN
MRSA, PCR: NEGATIVE
Staphylococcus aureus: NEGATIVE

## 2020-07-10 LAB — PREPARE RBC (CROSSMATCH)

## 2020-07-10 SURGERY — BYPASS GRAFT FEMORAL-POPLITEAL ARTERY
Anesthesia: General | Site: Leg Lower | Laterality: Right

## 2020-07-10 MED ORDER — SENNOSIDES-DOCUSATE SODIUM 8.6-50 MG PO TABS: 1.0000 | ORAL_TABLET | Freq: Every evening | ORAL | Status: DC | PRN

## 2020-07-10 MED ORDER — CEFAZOLIN SODIUM-DEXTROSE 2-4 GM/100ML-% IV SOLN
2.0000 g | Freq: Three times a day (TID) | INTRAVENOUS | Status: AC
  Administered 2020-07-10 – 2020-07-11 (×2): 2 g via INTRAVENOUS
  Filled 2020-07-10 (×2): qty 100

## 2020-07-10 MED ORDER — SODIUM CHLORIDE 0.9% IV SOLUTION: Freq: Once | INTRAVENOUS | Status: DC

## 2020-07-10 MED ORDER — MIDAZOLAM HCL 5 MG/5ML IJ SOLN
INTRAMUSCULAR | Status: DC | PRN
  Administered 2020-07-10: 1 mg via INTRAVENOUS

## 2020-07-10 MED ORDER — ALUM & MAG HYDROXIDE-SIMETH 200-200-20 MG/5ML PO SUSP: 15.0000 mL | ORAL | Status: DC | PRN

## 2020-07-10 MED ORDER — ARIPIPRAZOLE 2 MG PO TABS
5.0000 mg | ORAL_TABLET | Freq: Every day | ORAL | Status: DC
  Administered 2020-07-12 – 2020-07-19 (×8): 5 mg via ORAL
  Filled 2020-07-10: qty 1
  Filled 2020-07-10 (×6): qty 3
  Filled 2020-07-10: qty 1
  Filled 2020-07-10: qty 3

## 2020-07-10 MED ORDER — FENTANYL CITRATE (PF) 100 MCG/2ML IJ SOLN
INTRAMUSCULAR | Status: DC | PRN
  Administered 2020-07-10 (×2): 50 ug via INTRAVENOUS
  Administered 2020-07-10: 100 ug via INTRAVENOUS
  Administered 2020-07-10: 50 ug via INTRAVENOUS

## 2020-07-10 MED ORDER — CEFAZOLIN SODIUM-DEXTROSE 2-3 GM-%(50ML) IV SOLR
INTRAVENOUS | Status: DC | PRN
  Administered 2020-07-10 (×2): 2 g via INTRAVENOUS

## 2020-07-10 MED ORDER — FENTANYL CITRATE (PF) 250 MCG/5ML IJ SOLN
INTRAMUSCULAR | Status: AC
  Filled 2020-07-10: qty 5

## 2020-07-10 MED ORDER — HEMOSTATIC AGENTS (NO CHARGE) OPTIME
TOPICAL | Status: DC | PRN
  Administered 2020-07-10 (×2): 1 via TOPICAL

## 2020-07-10 MED ORDER — ONDANSETRON HCL 4 MG/2ML IJ SOLN
4.0000 mg | Freq: Four times a day (QID) | INTRAMUSCULAR | Status: DC | PRN
  Administered 2020-07-11 (×2): 4 mg via INTRAVENOUS
  Filled 2020-07-10 (×2): qty 2

## 2020-07-10 MED ORDER — ACETAMINOPHEN 325 MG RE SUPP
325.0000 mg | RECTAL | Status: DC | PRN
  Filled 2020-07-10: qty 2

## 2020-07-10 MED ORDER — CEFAZOLIN SODIUM 1 G IJ SOLR
INTRAMUSCULAR | Status: AC
  Filled 2020-07-10: qty 20

## 2020-07-10 MED ORDER — DEXTROSE 50 % IV SOLN
25.0000 mL | Freq: Once | INTRAVENOUS | Status: AC
  Filled 2020-07-10: qty 50

## 2020-07-10 MED ORDER — IODIXANOL 320 MG/ML IV SOLN
INTRAVENOUS | Status: DC | PRN
  Administered 2020-07-10: 50 mL via INTRA_ARTERIAL

## 2020-07-10 MED ORDER — FENTANYL CITRATE (PF) 100 MCG/2ML IJ SOLN
INTRAMUSCULAR | Status: AC
  Filled 2020-07-10: qty 2

## 2020-07-10 MED ORDER — SODIUM CHLORIDE 0.9 % IV SOLN: 500.0000 mL | Freq: Once | INTRAVENOUS | Status: DC | PRN

## 2020-07-10 MED ORDER — PHENYLEPHRINE HCL (PRESSORS) 10 MG/ML IV SOLN
INTRAVENOUS | Status: DC | PRN
  Administered 2020-07-10 (×2): 80 ug via INTRAVENOUS
  Administered 2020-07-10: 160 ug via INTRAVENOUS

## 2020-07-10 MED ORDER — DEXAMETHASONE SODIUM PHOSPHATE 10 MG/ML IJ SOLN
INTRAMUSCULAR | Status: AC
  Filled 2020-07-10: qty 1

## 2020-07-10 MED ORDER — CHLORHEXIDINE GLUCONATE 0.12 % MT SOLN
OROMUCOSAL | Status: AC
  Administered 2020-07-10: 15 mL via OROMUCOSAL
  Filled 2020-07-10: qty 15

## 2020-07-10 MED ORDER — FUROSEMIDE 40 MG PO TABS: 40.0000 mg | ORAL_TABLET | Freq: Every day | ORAL | Status: DC

## 2020-07-10 MED ORDER — PROTAMINE SULFATE 10 MG/ML IV SOLN
INTRAVENOUS | Status: DC | PRN
  Administered 2020-07-10: 50 mg via INTRAVENOUS

## 2020-07-10 MED ORDER — LACTATED RINGERS IV SOLN: INTRAVENOUS | Status: DC | PRN

## 2020-07-10 MED ORDER — DEXTROSE 50 % IV SOLN
INTRAVENOUS | Status: AC
  Administered 2020-07-10: 25 mL via INTRAVENOUS
  Filled 2020-07-10: qty 50

## 2020-07-10 MED ORDER — SUGAMMADEX SODIUM 200 MG/2ML IV SOLN
INTRAVENOUS | Status: DC | PRN
  Administered 2020-07-10: 200 mg via INTRAVENOUS

## 2020-07-10 MED ORDER — PHENYLEPHRINE HCL-NACL 10-0.9 MG/250ML-% IV SOLN
INTRAVENOUS | Status: DC | PRN
  Administered 2020-07-10: 10 ug/min via INTRAVENOUS

## 2020-07-10 MED ORDER — PHENOL 1.4 % MT LIQD: 1.0000 | OROMUCOSAL | Status: DC | PRN

## 2020-07-10 MED ORDER — HYDROMORPHONE HCL 1 MG/ML IJ SOLN: 0.5000 mg | INTRAMUSCULAR | Status: DC | PRN

## 2020-07-10 MED ORDER — FERROUS SULFATE 325 (65 FE) MG PO TABS
325.0000 mg | ORAL_TABLET | Freq: Every day | ORAL | Status: DC
  Administered 2020-07-11 – 2020-07-19 (×9): 325 mg via ORAL
  Filled 2020-07-10 (×9): qty 1

## 2020-07-10 MED ORDER — HEPARIN SODIUM (PORCINE) 1000 UNIT/ML IJ SOLN
INTRAMUSCULAR | Status: AC
  Filled 2020-07-10: qty 1

## 2020-07-10 MED ORDER — POTASSIUM CHLORIDE CRYS ER 20 MEQ PO TBCR: 20.0000 meq | EXTENDED_RELEASE_TABLET | Freq: Every day | ORAL | Status: DC | PRN

## 2020-07-10 MED ORDER — PROTAMINE SULFATE 10 MG/ML IV SOLN
INTRAVENOUS | Status: AC
  Filled 2020-07-10: qty 5

## 2020-07-10 MED ORDER — CITALOPRAM HYDROBROMIDE 20 MG PO TABS
20.0000 mg | ORAL_TABLET | Freq: Two times a day (BID) | ORAL | Status: DC
  Administered 2020-07-11 – 2020-07-19 (×17): 20 mg via ORAL
  Filled 2020-07-10 (×17): qty 1

## 2020-07-10 MED ORDER — SACUBITRIL-VALSARTAN 24-26 MG PO TABS
1.0000 | ORAL_TABLET | Freq: Two times a day (BID) | ORAL | Status: DC
  Filled 2020-07-10: qty 1

## 2020-07-10 MED ORDER — EMPAGLIFLOZIN 25 MG PO TABS
25.0000 mg | ORAL_TABLET | Freq: Every day | ORAL | Status: DC
  Filled 2020-07-10: qty 1

## 2020-07-10 MED ORDER — SODIUM CHLORIDE 0.9 % IV SOLN: INTRAVENOUS | Status: DC

## 2020-07-10 MED ORDER — ROCURONIUM BROMIDE 10 MG/ML (PF) SYRINGE
PREFILLED_SYRINGE | INTRAVENOUS | Status: AC
  Filled 2020-07-10: qty 20

## 2020-07-10 MED ORDER — ASPIRIN EC 81 MG PO TBEC
81.0000 mg | DELAYED_RELEASE_TABLET | Freq: Every day | ORAL | Status: DC
  Administered 2020-07-11 – 2020-07-19 (×9): 81 mg via ORAL
  Filled 2020-07-10 (×9): qty 1

## 2020-07-10 MED ORDER — PANTOPRAZOLE SODIUM 40 MG PO TBEC
40.0000 mg | DELAYED_RELEASE_TABLET | Freq: Every day | ORAL | Status: DC
  Administered 2020-07-11 – 2020-07-19 (×9): 40 mg via ORAL
  Filled 2020-07-10 (×9): qty 1

## 2020-07-10 MED ORDER — LIDOCAINE 2% (20 MG/ML) 5 ML SYRINGE
INTRAMUSCULAR | Status: DC | PRN
  Administered 2020-07-10: 80 mg via INTRAVENOUS

## 2020-07-10 MED ORDER — DEXTROSE 50 % IV SOLN
INTRAVENOUS | Status: DC | PRN
  Administered 2020-07-10: 25 g via INTRAVENOUS

## 2020-07-10 MED ORDER — ADULT MULTIVITAMIN W/MINERALS CH
1.0000 | ORAL_TABLET | Freq: Every day | ORAL | Status: DC
  Administered 2020-07-11 – 2020-07-19 (×9): 1 via ORAL
  Filled 2020-07-10 (×9): qty 1

## 2020-07-10 MED ORDER — DEXAMETHASONE SODIUM PHOSPHATE 10 MG/ML IJ SOLN
INTRAMUSCULAR | Status: DC | PRN
  Administered 2020-07-10: 8 mg via INTRAVENOUS

## 2020-07-10 MED ORDER — ROCURONIUM BROMIDE 10 MG/ML (PF) SYRINGE
PREFILLED_SYRINGE | INTRAVENOUS | Status: DC | PRN
  Administered 2020-07-10: 10 mg via INTRAVENOUS
  Administered 2020-07-10: 15 mg via INTRAVENOUS
  Administered 2020-07-10: 60 mg via INTRAVENOUS
  Administered 2020-07-10: 30 mg via INTRAVENOUS
  Administered 2020-07-10: 20 mg via INTRAVENOUS
  Administered 2020-07-10: 15 mg via INTRAVENOUS

## 2020-07-10 MED ORDER — HYDRALAZINE HCL 20 MG/ML IJ SOLN: 5.0000 mg | INTRAMUSCULAR | Status: DC | PRN

## 2020-07-10 MED ORDER — ONDANSETRON HCL 4 MG/2ML IJ SOLN
INTRAMUSCULAR | Status: DC | PRN
  Administered 2020-07-10: 4 mg via INTRAVENOUS

## 2020-07-10 MED ORDER — CEFAZOLIN SODIUM-DEXTROSE 2-4 GM/100ML-% IV SOLN
INTRAVENOUS | Status: AC
  Filled 2020-07-10: qty 100

## 2020-07-10 MED ORDER — ACETAMINOPHEN 325 MG PO TABS
325.0000 mg | ORAL_TABLET | ORAL | Status: DC | PRN
  Administered 2020-07-15 – 2020-07-18 (×2): 650 mg via ORAL
  Filled 2020-07-10 (×2): qty 2

## 2020-07-10 MED ORDER — CARVEDILOL 25 MG PO TABS: 25.0000 mg | ORAL_TABLET | Freq: Two times a day (BID) | ORAL | Status: DC

## 2020-07-10 MED ORDER — NITROGLYCERIN 0.4 MG SL SUBL: 0.4000 mg | SUBLINGUAL_TABLET | SUBLINGUAL | Status: DC | PRN

## 2020-07-10 MED ORDER — HEPARIN SODIUM (PORCINE) 1000 UNIT/ML IJ SOLN
INTRAMUSCULAR | Status: DC | PRN
  Administered 2020-07-10: 1000 [IU] via INTRAVENOUS
  Administered 2020-07-10 (×2): 2000 [IU] via INTRAVENOUS
  Administered 2020-07-10: 8000 [IU] via INTRAVENOUS

## 2020-07-10 MED ORDER — LACTATED RINGERS IV SOLN: INTRAVENOUS | Status: DC

## 2020-07-10 MED ORDER — OXYCODONE-ACETAMINOPHEN 5-325 MG PO TABS
1.0000 | ORAL_TABLET | ORAL | Status: DC | PRN
  Administered 2020-07-11 – 2020-07-19 (×9): 2 via ORAL
  Filled 2020-07-10 (×9): qty 2

## 2020-07-10 MED ORDER — SODIUM CHLORIDE 0.9 % IV SOLN
INTRAVENOUS | Status: DC | PRN
  Administered 2020-07-10: 11:00:00 500 mL

## 2020-07-10 MED ORDER — ATORVASTATIN CALCIUM 40 MG PO TABS
40.0000 mg | ORAL_TABLET | Freq: Every day | ORAL | Status: DC
  Administered 2020-07-11 – 2020-07-18 (×8): 40 mg via ORAL
  Filled 2020-07-10 (×9): qty 1

## 2020-07-10 MED ORDER — DOCUSATE SODIUM 100 MG PO CAPS
100.0000 mg | ORAL_CAPSULE | Freq: Every day | ORAL | Status: DC
  Administered 2020-07-11 – 2020-07-18 (×8): 100 mg via ORAL
  Filled 2020-07-10 (×9): qty 1

## 2020-07-10 MED ORDER — PHENYLEPHRINE 40 MCG/ML (10ML) SYRINGE FOR IV PUSH (FOR BLOOD PRESSURE SUPPORT)
PREFILLED_SYRINGE | INTRAVENOUS | Status: AC
  Filled 2020-07-10: qty 10

## 2020-07-10 MED ORDER — MAGNESIUM SULFATE 2 GM/50ML IV SOLN: 2.0000 g | Freq: Every day | INTRAVENOUS | Status: DC | PRN

## 2020-07-10 MED ORDER — HEPARIN SODIUM (PORCINE) 5000 UNIT/ML IJ SOLN
5000.0000 [IU] | Freq: Three times a day (TID) | INTRAMUSCULAR | Status: DC
  Administered 2020-07-11 – 2020-07-12 (×2): 5000 [IU] via SUBCUTANEOUS
  Filled 2020-07-10 (×2): qty 1

## 2020-07-10 MED ORDER — 0.9 % SODIUM CHLORIDE (POUR BTL) OPTIME
TOPICAL | Status: DC | PRN
  Administered 2020-07-10: 1000 mL

## 2020-07-10 MED ORDER — MIDAZOLAM HCL 2 MG/2ML IJ SOLN
INTRAMUSCULAR | Status: AC
  Filled 2020-07-10: qty 2

## 2020-07-10 MED ORDER — METOPROLOL TARTRATE 5 MG/5ML IV SOLN: 2.0000 mg | INTRAVENOUS | Status: DC | PRN

## 2020-07-10 MED ORDER — SPIRONOLACTONE 12.5 MG HALF TABLET
12.5000 mg | ORAL_TABLET | Freq: Every day | ORAL | Status: DC
  Filled 2020-07-10: qty 1

## 2020-07-10 MED ORDER — INSULIN ASPART 100 UNIT/ML ~~LOC~~ SOLN
0.0000 [IU] | Freq: Three times a day (TID) | SUBCUTANEOUS | Status: DC
  Administered 2020-07-12 (×2): 3 [IU] via SUBCUTANEOUS
  Administered 2020-07-13: 2 [IU] via SUBCUTANEOUS
  Administered 2020-07-13 – 2020-07-14 (×3): 1 [IU] via SUBCUTANEOUS
  Administered 2020-07-14: 2 [IU] via SUBCUTANEOUS
  Administered 2020-07-14 – 2020-07-15 (×2): 1 [IU] via SUBCUTANEOUS
  Administered 2020-07-15 – 2020-07-16 (×2): 2 [IU] via SUBCUTANEOUS
  Administered 2020-07-16 – 2020-07-17 (×2): 1 [IU] via SUBCUTANEOUS
  Administered 2020-07-17: 2 [IU] via SUBCUTANEOUS
  Administered 2020-07-17 – 2020-07-18 (×3): 1 [IU] via SUBCUTANEOUS
  Administered 2020-07-19: 2 [IU] via SUBCUTANEOUS
  Administered 2020-07-19: 1 [IU] via SUBCUTANEOUS

## 2020-07-10 MED ORDER — LABETALOL HCL 5 MG/ML IV SOLN: 10.0000 mg | INTRAVENOUS | Status: DC | PRN

## 2020-07-10 MED ORDER — TRAZODONE HCL 50 MG PO TABS
50.0000 mg | ORAL_TABLET | Freq: Every day | ORAL | Status: DC
  Administered 2020-07-11 – 2020-07-18 (×7): 50 mg via ORAL
  Filled 2020-07-10 (×9): qty 1

## 2020-07-10 MED ORDER — EPHEDRINE SULFATE 50 MG/ML IJ SOLN
INTRAMUSCULAR | Status: DC | PRN
  Administered 2020-07-10: 5 mg via INTRAVENOUS
  Administered 2020-07-10: 10 mg via INTRAVENOUS

## 2020-07-10 MED ORDER — GUAIFENESIN-DM 100-10 MG/5ML PO SYRP: 15.0000 mL | ORAL_SOLUTION | ORAL | Status: DC | PRN

## 2020-07-10 MED ORDER — FENTANYL CITRATE (PF) 100 MCG/2ML IJ SOLN
25.0000 ug | INTRAMUSCULAR | Status: DC | PRN
  Administered 2020-07-10 (×2): 50 ug via INTRAVENOUS

## 2020-07-10 MED ORDER — ONDANSETRON HCL 4 MG/2ML IJ SOLN: 4.0000 mg | Freq: Once | INTRAMUSCULAR | Status: DC | PRN

## 2020-07-10 MED ORDER — ONDANSETRON HCL 4 MG/2ML IJ SOLN
INTRAMUSCULAR | Status: AC
  Filled 2020-07-10: qty 2

## 2020-07-10 MED ORDER — FLEET ENEMA 7-19 GM/118ML RE ENEM: 1.0000 | ENEMA | Freq: Once | RECTAL | Status: DC | PRN

## 2020-07-10 MED ORDER — ETOMIDATE 2 MG/ML IV SOLN
INTRAVENOUS | Status: DC | PRN
  Administered 2020-07-10: 16 mg via INTRAVENOUS

## 2020-07-10 MED ORDER — CHLORHEXIDINE GLUCONATE 0.12 % MT SOLN
15.0000 mL | OROMUCOSAL | Status: AC
  Filled 2020-07-10: qty 15

## 2020-07-10 MED ORDER — BISACODYL 5 MG PO TBEC: 5.0000 mg | DELAYED_RELEASE_TABLET | Freq: Every day | ORAL | Status: DC | PRN

## 2020-07-10 MED ORDER — ACETAMINOPHEN 500 MG PO TABS
1000.0000 mg | ORAL_TABLET | Freq: Once | ORAL | Status: AC
  Administered 2020-07-10: 1000 mg via ORAL
  Filled 2020-07-10: qty 2

## 2020-07-10 MED ORDER — PROPOFOL 10 MG/ML IV BOLUS
INTRAVENOUS | Status: AC
  Filled 2020-07-10: qty 20

## 2020-07-10 SURGICAL SUPPLY — 111 items
ADH SKN CLS APL DERMABOND .7 (GAUZE/BANDAGES/DRESSINGS) ×2
AGENT HMST KT MTR STRL THRMB (HEMOSTASIS) ×2
APL PRP STRL LF DISP 70% ISPRP (MISCELLANEOUS)
BAG BANDED W/RUBBER/TAPE 36X54 (MISCELLANEOUS) ×4 IMPLANT
BAG EQP BAND 135X91 W/RBR TAPE (MISCELLANEOUS) ×2
BAG SNAP BAND KOVER 36X36 (MISCELLANEOUS) ×4 IMPLANT
BANDAGE ESMARK 6X9 LF (GAUZE/BANDAGES/DRESSINGS) IMPLANT
BLADE SURG 11 STRL SS (BLADE) ×4 IMPLANT
BNDG CMPR 9X6 STRL LF SNTH (GAUZE/BANDAGES/DRESSINGS) ×2
BNDG ESMARK 6X9 LF (GAUZE/BANDAGES/DRESSINGS) ×4
CANISTER SUCT 3000ML PPV (MISCELLANEOUS) ×4 IMPLANT
CANNULA VESSEL 3MM 2 BLNT TIP (CANNULA) ×4 IMPLANT
CATH ANGIO 5F BER2 65CM (CATHETERS) IMPLANT
CATH OMNI FLUSH .035X70CM (CATHETERS) IMPLANT
CATH OMNI FLUSH 5F 65CM (CATHETERS) ×2 IMPLANT
CHLORAPREP W/TINT 26 (MISCELLANEOUS) IMPLANT
CLIP VESOCCLUDE MED 24/CT (CLIP) ×4 IMPLANT
CLIP VESOCCLUDE SM WIDE 24/CT (CLIP) ×6 IMPLANT
CLOSURE MYNX CONTROL 6F/7F (Vascular Products) ×2 IMPLANT
COVER DOME SNAP 22 D (MISCELLANEOUS) ×4 IMPLANT
COVER PROBE W GEL 5X96 (DRAPES) ×4 IMPLANT
COVER SURGICAL LIGHT HANDLE (MISCELLANEOUS) ×6 IMPLANT
COVER WAND RF STERILE (DRAPES) ×4 IMPLANT
CUFF TOURN SGL QUICK 24 (TOURNIQUET CUFF) ×4
CUFF TOURN SGL QUICK 34 (TOURNIQUET CUFF)
CUFF TOURN SGL QUICK 42 (TOURNIQUET CUFF) IMPLANT
CUFF TRNQT CYL 24X4X16.5-23 (TOURNIQUET CUFF) IMPLANT
CUFF TRNQT CYL 34X4.125X (TOURNIQUET CUFF) IMPLANT
DCB RANGER 4.0X40 135 (BALLOONS) IMPLANT
DERMABOND ADVANCED (GAUZE/BANDAGES/DRESSINGS) ×2
DERMABOND ADVANCED .7 DNX12 (GAUZE/BANDAGES/DRESSINGS) ×4 IMPLANT
DEVICE TORQUE H2O (MISCELLANEOUS) IMPLANT
DRAIN CHANNEL 15F RND FF W/TCR (WOUND CARE) IMPLANT
DRAPE FEMORAL ANGIO 80X135IN (DRAPES) ×4 IMPLANT
DRAPE HALF SHEET 40X57 (DRAPES) IMPLANT
DRAPE X-RAY CASS 24X20 (DRAPES) IMPLANT
DRSG COVADERM 4X14 (GAUZE/BANDAGES/DRESSINGS) ×2 IMPLANT
DRSG COVADERM 4X8 (GAUZE/BANDAGES/DRESSINGS) ×2 IMPLANT
ELECT REM PT RETURN 9FT ADLT (ELECTROSURGICAL) ×4
ELECTRODE REM PT RTRN 9FT ADLT (ELECTROSURGICAL) ×2 IMPLANT
EVACUATOR SILICONE 100CC (DRAIN) IMPLANT
GAUZE 4X4 16PLY RFD (DISPOSABLE) ×4 IMPLANT
GAUZE SPONGE 4X4 12PLY STRL (GAUZE/BANDAGES/DRESSINGS) ×2 IMPLANT
GLIDEWIRE ADV .035X180CM (WIRE) ×2 IMPLANT
GLOVE BIO SURGEON STRL SZ7.5 (GLOVE) ×2 IMPLANT
GLOVE BIOGEL PI IND STRL 7.5 (GLOVE) ×2 IMPLANT
GLOVE BIOGEL PI INDICATOR 7.5 (GLOVE) ×4
GLOVE ECLIPSE 7.0 STRL STRAW (GLOVE) ×2 IMPLANT
GLOVE SURG POLY ORTHO LF SZ7.5 (GLOVE) ×2 IMPLANT
GLOVE SURG POLYISO LF SZ7.5 (GLOVE) ×2 IMPLANT
GLOVE SURG SS PI 7.5 STRL IVOR (GLOVE) ×4 IMPLANT
GLOVE SURG UNDER POLY LF SZ7 (GLOVE) ×10 IMPLANT
GLOVE SURG UNDER POLY LF SZ7.5 (GLOVE) ×2 IMPLANT
GOWN STRL REUS W/ TWL LRG LVL3 (GOWN DISPOSABLE) ×4 IMPLANT
GOWN STRL REUS W/ TWL XL LVL3 (GOWN DISPOSABLE) ×2 IMPLANT
GOWN STRL REUS W/TWL LRG LVL3 (GOWN DISPOSABLE) ×36
GOWN STRL REUS W/TWL XL LVL3 (GOWN DISPOSABLE) ×8
GUIDEWIRE ANGLED .035X150CM (WIRE) IMPLANT
HEMOSTAT SNOW SURGICEL 2X4 (HEMOSTASIS) ×2 IMPLANT
INSERT FOGARTY SM (MISCELLANEOUS) IMPLANT
KIT BASIN OR (CUSTOM PROCEDURE TRAY) ×4 IMPLANT
KIT ENCORE 26 ADVANTAGE (KITS) ×4 IMPLANT
KIT TURNOVER KIT B (KITS) ×4 IMPLANT
MARKER GRAFT CORONARY BYPASS (MISCELLANEOUS) IMPLANT
NDL PERC 18GX7CM (NEEDLE) ×2 IMPLANT
NEEDLE PERC 18GX7CM (NEEDLE) ×4 IMPLANT
NS IRRIG 1000ML POUR BTL (IV SOLUTION) ×6 IMPLANT
PACK PERIPHERAL VASCULAR (CUSTOM PROCEDURE TRAY) ×4 IMPLANT
PACK SURGICAL SETUP 50X90 (CUSTOM PROCEDURE TRAY) ×4 IMPLANT
PAD ARMBOARD 7.5X6 YLW CONV (MISCELLANEOUS) ×8 IMPLANT
PROTECTION STATION PRESSURIZED (MISCELLANEOUS)
RANGER DCB 4.0X40 135 (BALLOONS) ×4
SET COLLECT BLD 21X3/4 12 (NEEDLE) IMPLANT
SET MICROPUNCTURE 5F STIFF (MISCELLANEOUS) ×4 IMPLANT
SHEATH AVANTI 11CM 5FR (SHEATH) IMPLANT
SHEATH PINNACLE 6F 10CM (SHEATH) ×2 IMPLANT
SHEATH PINNACLE ST 6F 45CM (SHEATH) ×2 IMPLANT
SPONGE LAP 18X18 RF (DISPOSABLE) ×10 IMPLANT
STATION PROTECTION PRESSURIZED (MISCELLANEOUS) ×2 IMPLANT
STOPCOCK 4 WAY LG BORE MALE ST (IV SETS) IMPLANT
STOPCOCK MORSE 400PSI 3WAY (MISCELLANEOUS) ×4 IMPLANT
SURGIFLO W/THROMBIN 8M KIT (HEMOSTASIS) ×2 IMPLANT
SUT ETHILON 3 0 PS 1 (SUTURE) ×8 IMPLANT
SUT GORETEX 6.0 TT13 (SUTURE) IMPLANT
SUT GORETEX 6.0 TT9 (SUTURE) IMPLANT
SUT PROLENE 5 0 C 1 24 (SUTURE) ×2 IMPLANT
SUT PROLENE 6 0 BV (SUTURE) ×16 IMPLANT
SUT PROLENE 7 0 BV 1 (SUTURE) IMPLANT
SUT SILK 2 0 SH (SUTURE) ×4 IMPLANT
SUT SILK 3 0 (SUTURE) ×8
SUT SILK 3-0 18XBRD TIE 12 (SUTURE) IMPLANT
SUT VIC AB 2-0 CT1 27 (SUTURE) ×8
SUT VIC AB 2-0 CT1 TAPERPNT 27 (SUTURE) ×4 IMPLANT
SUT VIC AB 3-0 SH 27 (SUTURE) ×20
SUT VIC AB 3-0 SH 27X BRD (SUTURE) ×4 IMPLANT
SUT VIC AB 4-0 PS2 18 (SUTURE) ×4 IMPLANT
SUT VICRYL 4-0 PS2 18IN ABS (SUTURE) ×8 IMPLANT
SYR 10ML LL (SYRINGE) ×12 IMPLANT
SYR 20ML LL LF (SYRINGE) ×4 IMPLANT
SYR 30ML LL (SYRINGE) ×4 IMPLANT
SYR BULB IRRIG 60ML STRL (SYRINGE) ×2 IMPLANT
SYR MEDRAD MARK V 150ML (SYRINGE) IMPLANT
TAPE CLOTH SURG 4X10 WHT LF (GAUZE/BANDAGES/DRESSINGS) ×2 IMPLANT
TOWEL GREEN STERILE (TOWEL DISPOSABLE) ×8 IMPLANT
TRAY FOLEY MTR SLVR 16FR STAT (SET/KITS/TRAYS/PACK) ×4 IMPLANT
TUBING EXTENTION W/L.L. (IV SETS) IMPLANT
TUBING HIGH PRESSURE 120CM (CONNECTOR) ×4 IMPLANT
UNDERPAD 30X36 HEAVY ABSORB (UNDERPADS AND DIAPERS) ×4 IMPLANT
WATER STERILE IRR 1000ML POUR (IV SOLUTION) ×4 IMPLANT
WIRE BENTSON .035X145CM (WIRE) ×4 IMPLANT
WIRE G V18X300CM (WIRE) ×2 IMPLANT

## 2020-07-10 NOTE — Progress Notes (Signed)
Short Stay called and report given. CBG = 61. Dr Myra Gianotti notified. CBG will be addressed in pre op. APAP X 1 does not need to be given per Dr Myra Gianotti.

## 2020-07-10 NOTE — Transfer of Care (Signed)
Immediate Anesthesia Transfer of Care Note  Patient: Paul [name].  Procedure(s) Performed: RIGHT ABOVE KNEE POPLITEAL TO POSTERIAL TIBIAL ARTERY BYPASS GRAFT (Right Leg Lower) LEFT LOWER EXTREMITY ANGIOGRAM WITH INTERVENTION (Left ) BALLOON ANGIOPLASTY OF LEFT POPLITEAL USING RANGER 4.40mm X 34mm OVER-THE-WIRE PTA BALLOON (Left )  Patient Location: PACU  Anesthesia Type:General  Level of Consciousness: awake and alert   Airway & Oxygen Therapy: Patient Spontanous Breathing and Patient connected to face mask oxygen  Post-op Assessment: Report given to RN, Post -op Vital signs reviewed and stable and Patient moving all extremities X 4  Post vital signs: Reviewed and stable  Last Vitals:  Vitals Value Taken Time  BP 128/85 07/10/20 1738  Temp    Pulse 71 07/10/20 1745  Resp 18 07/10/20 1745  SpO2 99 % 07/10/20 1745  Vitals shown include unvalidated device data.  Last Pain:  Vitals:   07/10/20 0831  TempSrc: Oral  PainSc: 0-No pain      Patients Stated Pain Goal: 0 (07/09/20 2000)  Complications: No complications documented.

## 2020-07-10 NOTE — Anesthesia Procedure Notes (Signed)
Procedure Name: Intubation Date/Time: 07/10/2020 10:22 AM Performed by: Inda Coke, CRNA Pre-anesthesia Checklist: Patient identified, Emergency Drugs available, Suction available and Patient being monitored Patient Re-evaluated:Patient Re-evaluated prior to induction Oxygen Delivery Method: Circle System Utilized Preoxygenation: Pre-oxygenation with 100% oxygen Induction Type: IV induction Ventilation: Mask ventilation without difficulty and Oral airway inserted - appropriate to patient size Laryngoscope Size: Mac and 4 Grade View: Grade I Tube type: Oral Tube size: 7.5 mm Number of attempts: 1 Airway Equipment and Method: Stylet and Oral airway Placement Confirmation: ETT inserted through vocal cords under direct vision,  positive ETCO2 and breath sounds checked- equal and bilateral Secured at: 23 cm Tube secured with: Tape Dental Injury: Teeth and Oropharynx as per pre-operative assessment

## 2020-07-10 NOTE — Anesthesia Procedure Notes (Signed)
Arterial Line Insertion Start/End12/08/2019 9:45 AM, 07/10/2020 9:50 AM Performed by: Modena Morrow, CRNA, CRNA  Patient location: Pre-op. Preanesthetic checklist: patient identified, IV checked, site marked, risks and benefits discussed, surgical consent, monitors and equipment checked, pre-op evaluation, timeout performed and anesthesia consent Lidocaine 1% used for infiltration Left, radial was placed Catheter size: 20 G Hand hygiene performed  and maximum sterile barriers used  Allen's test indicative of satisfactory collateral circulation Attempts: 1 Procedure performed without using ultrasound guided technique. Following insertion, dressing applied and Biopatch. Post procedure assessment: normal  Patient tolerated the procedure well with no immediate complications.

## 2020-07-10 NOTE — H&P (Signed)
   Patient name: Paul [name]. MRN: [id number] DOB: 11/08/1970 Sex: male   HISTORY OF PRESENT ILLNESS:   Paul [name]. is a 49 y.o. male who is status post bilateral lower extremity thrombectomy for acute ischemia several months ago.  He aslo had a left popliteal stent.  He has developed bilateral rest pain.  Angio yesterday showed right popliteal occlusion and right popliteal stenosis.  CURRENT MEDICATIONS:    Current Facility-Administered Medications  Medication Dose Route Frequency Provider Last Rate Last Admin  . 0.9 %  sodium chloride infusion  250 mL Intravenous PRN Nada Libman, MD      . acetaminophen (TYLENOL) tablet 1,000 mg  1,000 mg Oral Once Cecile Hearing, MD      . acetaminophen (TYLENOL) tablet 650 mg  650 mg Oral Q4H PRN Nada Libman, MD      . aspirin EC tablet 81 mg  81 mg Oral Daily Nada Libman, MD      . hydrALAZINE (APRESOLINE) injection 5 mg  5 mg Intravenous Q20 Min PRN Nada Libman, MD      . insulin aspart (novoLOG) injection 0-15 Units  0-15 Units Subcutaneous TID WC Chuck Hint, MD      . labetalol (NORMODYNE) injection 10 mg  10 mg Intravenous Q10 min PRN Nada Libman, MD   10 mg at 07/09/20 2112  . morphine 2 MG/ML injection 2 mg  2 mg Intravenous Q1H PRN Nada Libman, MD      . ondansetron [facility]) injection 4 mg  4 mg Intravenous Q6H PRN Nada Libman, MD      . oxyCODONE (Oxy IR/ROXICODONE) immediate release tablet 5-10 mg  5-10 mg Oral Q4H PRN Nada Libman, MD   5 mg at 07/10/20 0512  . sodium chloride flush (NS) 0.9 % injection 3 mL  3 mL Intravenous Q12H Nada Libman, MD      . sodium chloride flush (NS) 0.9 % injection 3 mL  3 mL Intravenous PRN Nada Libman, MD        REVIEW OF SYSTEMS:   [X]  denotes positive finding, [ ]  denotes negative finding Cardiac  Comments:  Chest pain or chest pressure:    Shortness of breath upon exertion:    Short  of breath when lying flat:    Irregular heart rhythm:    Constitutional    Fever or chills:      PHYSICAL EXAM:   Vitals:   07/09/20 2054 07/09/20 2140 07/10/20 0000 07/10/20 0453  BP: (!) 140/106 (!) 159/109 (!) 150/107 (!) 131/94  Pulse: 98  98 100  Resp: 16  18 18   Temp: 98.1 F (36.7 C)  98.6 F (37 C) 97.9 F (36.6 C)  TempSrc: Oral  Oral   SpO2: 92%  98% (!) 83%  Weight:      Height:        GENERAL: The patient is a well-nourished male, in no acute distress. The vital signs are documented above. CARDIOVASCULAR: There is a regular rate and rhythm. PULMONARY: Non-labored respirations    MEDICAL ISSUES:   Plan is for right leg bypass and left leg angioplasty/stent.  Details of procedure was discussed with patient. All questions answered  14/01/21, MD, FACS [facility] [phone] Pager [phone]

## 2020-07-10 NOTE — Progress Notes (Signed)
CRNA in room with patient gave an additional 1/2 amp of Dextrose for CBG 46. Dr. Desmond Lope at bedside. Will recheck CBG in 15 mins

## 2020-07-10 NOTE — Progress Notes (Signed)
Sherrilyn Rist, CRNA taking patient back to surgery at this time. Stated she would recheck CBG in OR.

## 2020-07-10 NOTE — Progress Notes (Signed)
Hypoglycemic Event  CBG: 55  Treatment: D50 25 mL (12.5 gm)  Symptoms: None  Follow-up CBG: Time:0945 CBG Result:46  Possible Reasons for Event: Inadequate meal intake  Comments/MD notified:Turk aware, orders received and carried out    Curley Spice

## 2020-07-10 NOTE — Op Note (Signed)
Patient name: Paul [name]. MRN: [id number] DOB: 10/25/1970 Sex: male  07/10/2020 Pre-operative Diagnosis: Bilateral Rest pain Post-operative diagnosis:  Same Surgeon:  Annamarie Major Assistants:  Minerva Areola, P.A., Azell Der, MS3 Procedure:   #1: Right above-knee popliteal to posterior tibial artery bypass graft with nonreversed ipsilateral translocated saphenous vein   #2: Ultrasound-guided access, right femoral artery   #3: Drug-coated balloon angioplasty, left posterior tibial artery Anesthesia: General Blood Loss: 200 cc Specimens: None  Findings: The vein was 3 mm in diameter and uniform throughout. The vein was scarred in. I did not have enough vein to perform a femoral tibial bypass. The above-knee popliteal artery was soft without significant plaque just beyond the adductor canal. The popliteal artery below the knee was circumferentially calcified and not adequate for distal target, as well as the proximal posterior tibial artery. Therefore the distal anastomosis was to the posterior tibial artery in the distal calf. This was a 2 mm artery without plaque. There was an 80% stenosis in the left below-knee popliteal artery just beyond the joint space which was successfully treated with a 4 mm drug-coated balloon with residual stenosis approximately 10%.  Indications: This is a 49 year old gentleman who presented several months ago with bilateral lower extremity ischemia likely secondary to Covid. He underwent bilateral lower extremity thrombectomy via femoral incisions. He has a known right popliteal occlusion. On the left side I had previously performed exposure of the below-knee popliteal artery and subsequent stenting of the distal above-knee popliteal artery. He has developed progressive bilateral rest pain. He underwent angiography yesterday which confirmed right popliteal occlusion and a 80% left below-knee popliteal artery stenosis. He comes in today for  revascularization.  Procedure:  The patient was identified in the holding area and taken to [place] 16  The patient was then placed supine on the table. general anesthesia was administered.  The patient was prepped and draped in the usual sterile fashion.  A time out was called and antibiotics were administered. A PA was necessary to expedite the procedure and assist with the technical details of the procedure. This includes suction retraction and suture closure.  Ultrasound was used to evaluate the saphenous vein in the right leg. It was a healthy vein down to the knee where it had multiple branches. I first began by making a medial below-knee incision. Through this incision I identified the saphenous vein and dissected it out, ligating all side branches between silk ties. The vein was scarred in and the patient had woody skin with a fair amount of subcutaneous edema. The vein was mobilized throughout the length of the incision. I then divided the fascia and exposed the below-knee popliteal artery. This was circumferentially calcified. I did not feel it was a suitable distal target. Through this incision, I also exposed the posterior tibial artery and I similarly did not think it was a good distal target. I then proceeded with harvesting of the saphenous vein in the thigh. This became a subfascial vein. I it was also scarred in. Additional proximal incisions were made to harvest the vein up to the saphenofemoral junction. I exposed the above-knee popliteal artery. This had an excellent pulse and was soft in caliber. I felt that this would be the best proximal target given the length of vein that I had. Next, I made an incision in the lower calf and expose the posterior tibial artery. At this level it was soft measuring about 2 mm. I felt this would be  the best target. I then ligated the vein proximally and distally. The vein was prepared on the back table. It was roughly a 3 mm vein throughout. It was marked  for orientation. I then created a subfascial tunnel up to the above-knee popliteal artery. The patient was then fully heparinized. Heparin levels were checked with ACT measurements and redosed appropriately. A tourniquet was placed on the upper thigh and a Esmarch was used to exsanguinate the leg. A tourniquet was inflated to 250 mm of pressure. The above-knee popliteal artery continued to have a pulse and so it was occluded with Henley clamps. A #11 blade was used to make an arteriotomy which was extended longitudinally with Potts scissors. The vein was then beveled to fit the size the arteriotomy, and a running anastomosis was created with 6-0 Prolene. Clamps were then released. A valvulotome was used to lyse the valves. 2 passes were made. There was excellent flow through the vein graft. The vein graft was then brought through the previously created tunnel making sure to maintain proper orientation. I then took down the tourniquet since it was not providing hemostasis. I then occluded the posterior tibial artery with Serafin clamps. A #11 blade was used to make an arteriotomy which was extended longitudinally with Potts scissors. The leg was straightened and the vein graft was cut the appropriate length. It was spatulated to fit the size the arteriotomy and a running anastomosis was created with 6-0 Prolene. Prior to completion the appropriate flushing maneuvers were performed and the anastomosis was completed. The patient had a brisk graft dependent posterior tibial Doppler signal at the ankle.  The wounds were then packed and attention was turned towards the right groin. The right groin was cannulated under ultrasound guidance with a micropuncture needle. A 018 wire was advanced without resistance followed by a micropuncture sheath. A Bentson wire was then inserted and a 6 French sheath was placed into the aorta. Using an Omni Flush catheter and a Glidewire advantage, the wire was placed into the left  superficial femoral artery. A 6 French 45 cm sheath was then advanced into the left superficial femoral artery. A V-18 wire was then advanced into the below-knee popliteal artery. A contrast injection was performed which located the 80% stenosis in the below-knee popliteal artery just below the joint space. I selected a 4 x 40 drug-coated Ranger balloon and performed balloon angioplasty for 3 minutes at 10 atm. Follow-up imaging showed no evidence of dissection with residual stenosis of approximately 10%. Satisfied with these results, the wire was removed. I exchanged the long 6 [place] sheath for a short 6 [place] sheath. The groin was closed with a minx. The patient's heparin was then reversed with 50 mg of protamine. The wounds were then irrigated. Hemostasis was achieved. The incisions were closed by reapproximating the fascia with 2-0 Vicryl. The subcutaneous tissue was closed with 3-0 Vicryl and the skin was closed with 4-0 subcuticular stitch. Several incisions were reinforced with interrupted 3 mattress nylon sutures. Sterile dressings were applied. The patient was successfully extubated taken recovery in stable condition.   Disposition: To PACU stable with no immediate complications.   Theotis Burrow, M.D., [facility] [place] Office: [phone] Pager:  [phone]

## 2020-07-11 ENCOUNTER — Inpatient Hospital Stay (HOSPITAL_COMMUNITY): Payer: Medicare Other

## 2020-07-11 ENCOUNTER — Encounter (HOSPITAL_COMMUNITY): Payer: Self-pay | Admitting: Surgery

## 2020-07-11 DIAGNOSIS — R9431 Abnormal electrocardiogram [ECG] [EKG]: Secondary | ICD-10-CM

## 2020-07-11 DIAGNOSIS — R001 Bradycardia, unspecified: Secondary | ICD-10-CM | POA: Diagnosis not present

## 2020-07-11 DIAGNOSIS — I361 Nonrheumatic tricuspid (valve) insufficiency: Secondary | ICD-10-CM

## 2020-07-11 DIAGNOSIS — I9589 Other hypotension: Secondary | ICD-10-CM | POA: Diagnosis not present

## 2020-07-11 DIAGNOSIS — I340 Nonrheumatic mitral (valve) insufficiency: Secondary | ICD-10-CM

## 2020-07-11 LAB — BASIC METABOLIC PANEL
Anion gap: 10 (ref 5–15)
BUN: 12 mg/dL (ref 6–20)
CO2: 22 mmol/L (ref 22–32)
Calcium: 8.4 mg/dL — ABNORMAL LOW (ref 8.9–10.3)
Chloride: 104 mmol/L (ref 98–111)
Creatinine, Ser: 0.86 mg/dL (ref 0.61–1.24)
GFR, Estimated: >60 mL/min (ref >60)
Glucose, Bld: 230 mg/dL — ABNORMAL HIGH (ref 70–99)
Potassium: 4.6 mmol/L (ref 3.5–5.1)
Sodium: 136 mmol/L (ref 135–145)

## 2020-07-11 LAB — COMPREHENSIVE METABOLIC PANEL
ALT: 17 U/L (ref 0–44)
AST: 27 U/L (ref 15–41)
Albumin: 2.4 g/dL — ABNORMAL LOW (ref 3.5–5.0)
Alkaline Phosphatase: 55 U/L (ref 38–126)
Anion gap: 12 (ref 5–15)
BUN: 16 mg/dL (ref 6–20)
CO2: 16 mmol/L — ABNORMAL LOW (ref 22–32)
Calcium: 8.1 mg/dL — ABNORMAL LOW (ref 8.9–10.3)
Chloride: 105 mmol/L (ref 98–111)
Creatinine, Ser: 1.25 mg/dL — ABNORMAL HIGH (ref 0.61–1.24)
GFR, Estimated: >60 mL/min (ref >60)
Glucose, Bld: 267 mg/dL — ABNORMAL HIGH (ref 70–99)
Potassium: 5.2 mmol/L — ABNORMAL HIGH (ref 3.5–5.1)
Sodium: 133 mmol/L — ABNORMAL LOW (ref 135–145)
Total Bilirubin: 1.6 mg/dL — ABNORMAL HIGH (ref 0.3–1.2)
Total Protein: 6.6 g/dL (ref 6.5–8.1)

## 2020-07-11 LAB — POCT I-STAT 7, (LYTES, BLD GAS, ICA,H+H)
Acid-base deficit: 1 mmol/L (ref 0.0–2.0)
Acid-base deficit: 1 mmol/L (ref 0.0–2.0)
Acid-base deficit: 2 mmol/L (ref 0.0–2.0)
Bicarbonate: 23.3 mmol/L (ref 20.0–28.0)
Bicarbonate: 23.4 mmol/L (ref 20.0–28.0)
Bicarbonate: 22.5 mmol/L (ref 20.0–28.0)
Calcium, Ion: 1.25 mmol/L (ref 1.15–1.40)
Calcium, Ion: 1.23 mmol/L (ref 1.15–1.40)
Calcium, Ion: 1.24 mmol/L (ref 1.15–1.40)
HCT: 33 % — ABNORMAL LOW (ref 39.0–52.0)
HCT: 34 % — ABNORMAL LOW (ref 39.0–52.0)
HCT: 31 % — ABNORMAL LOW (ref 39.0–52.0)
Hemoglobin: 11.2 g/dL — ABNORMAL LOW (ref 13.0–17.0)
Hemoglobin: 11.6 g/dL — ABNORMAL LOW (ref 13.0–17.0)
Hemoglobin: 10.5 g/dL — ABNORMAL LOW (ref 13.0–17.0)
O2 Saturation: 99 %
O2 Saturation: 100 %
O2 Saturation: 100 %
Patient temperature: 35.6
Patient temperature: 36.8
Potassium: 3.7 mmol/L (ref 3.5–5.1)
Potassium: 4.1 mmol/L (ref 3.5–5.1)
Potassium: 4.4 mmol/L (ref 3.5–5.1)
Sodium: 140 mmol/L (ref 135–145)
Sodium: 139 mmol/L (ref 135–145)
Sodium: 139 mmol/L (ref 135–145)
TCO2: 24 mmol/L (ref 22–32)
TCO2: 25 mmol/L (ref 22–32)
TCO2: 24 mmol/L (ref 22–32)
pCO2 arterial: 35.6 mmHg (ref 32.0–48.0)
pCO2 arterial: 37.5 mmHg (ref 32.0–48.0)
pCO2 arterial: 37.9 mmHg (ref 32.0–48.0)
pH, Arterial: 7.417 (ref 7.350–7.450)
pH, Arterial: 7.403 (ref 7.350–7.450)
pH, Arterial: 7.38 (ref 7.350–7.450)
pO2, Arterial: 157 mmHg — ABNORMAL HIGH (ref 83.0–108.0)
pO2, Arterial: 218 mmHg — ABNORMAL HIGH (ref 83.0–108.0)
pO2, Arterial: 170 mmHg — ABNORMAL HIGH (ref 83.0–108.0)

## 2020-07-11 LAB — POCT ACTIVATED CLOTTING TIME
Activated Clotting Time: 213 seconds
Activated Clotting Time: 208 seconds
Activated Clotting Time: 191 seconds

## 2020-07-11 LAB — CBC
HCT: 23 % — ABNORMAL LOW (ref 39.0–52.0)
HCT: 27.5 % — ABNORMAL LOW (ref 39.0–52.0)
Hemoglobin: 6.7 g/dL — CL (ref 13.0–17.0)
Hemoglobin: 8.4 g/dL — ABNORMAL LOW (ref 13.0–17.0)
MCH: 18.9 pg — ABNORMAL LOW (ref 26.0–34.0)
MCH: 21.2 pg — ABNORMAL LOW (ref 26.0–34.0)
MCHC: 29.1 g/dL — ABNORMAL LOW (ref 30.0–36.0)
MCHC: 30.5 g/dL (ref 30.0–36.0)
MCV: 65 fL — ABNORMAL LOW (ref 80.0–100.0)
MCV: 69.3 fL — ABNORMAL LOW (ref 80.0–100.0)
Platelets: 275 10*3/uL (ref 150–400)
Platelets: 250 10*3/uL (ref 150–400)
RBC: 3.54 MIL/uL — ABNORMAL LOW (ref 4.22–5.81)
RBC: 3.97 MIL/uL — ABNORMAL LOW (ref 4.22–5.81)
RDW: 24.3 % — ABNORMAL HIGH (ref 11.5–15.5)
RDW: 25.7 % — ABNORMAL HIGH (ref 11.5–15.5)
WBC: 10.3 10*3/uL (ref 4.0–10.5)
WBC: 10.9 10*3/uL — ABNORMAL HIGH (ref 4.0–10.5)
nRBC: 0.3 % — ABNORMAL HIGH (ref 0.0–0.2)
nRBC: 1.1 % — ABNORMAL HIGH (ref 0.0–0.2)

## 2020-07-11 LAB — POCT I-STAT, CHEM 8
BUN: 9 mg/dL (ref 6–20)
BUN: 9 mg/dL (ref 6–20)
Calcium, Ion: 1.18 mmol/L (ref 1.15–1.40)
Calcium, Ion: 1.25 mmol/L (ref 1.15–1.40)
Chloride: 103 mmol/L (ref 98–111)
Chloride: 104 mmol/L (ref 98–111)
Creatinine, Ser: 0.8 mg/dL (ref 0.61–1.24)
Creatinine, Ser: 0.8 mg/dL (ref 0.61–1.24)
Glucose, Bld: 216 mg/dL — ABNORMAL HIGH (ref 70–99)
Glucose, Bld: 198 mg/dL — ABNORMAL HIGH (ref 70–99)
HCT: 34 % — ABNORMAL LOW (ref 39.0–52.0)
HCT: 31 % — ABNORMAL LOW (ref 39.0–52.0)
Hemoglobin: 11.6 g/dL — ABNORMAL LOW (ref 13.0–17.0)
Hemoglobin: 10.5 g/dL — ABNORMAL LOW (ref 13.0–17.0)
Potassium: 3.9 mmol/L (ref 3.5–5.1)
Potassium: 4.4 mmol/L (ref 3.5–5.1)
Sodium: 138 mmol/L (ref 135–145)
Sodium: 138 mmol/L (ref 135–145)
TCO2: 23 mmol/L (ref 22–32)
TCO2: 22 mmol/L (ref 22–32)

## 2020-07-11 LAB — GLUCOSE, CAPILLARY
Glucose-Capillary: 97 mg/dL (ref 70–99)
Glucose-Capillary: 126 mg/dL — ABNORMAL HIGH (ref 70–99)
Glucose-Capillary: 91 mg/dL (ref 70–99)
Glucose-Capillary: 229 mg/dL — ABNORMAL HIGH (ref 70–99)

## 2020-07-11 LAB — HEMOGLOBIN A1C
Hgb A1c MFr Bld: 8.3 % — ABNORMAL HIGH (ref 4.8–5.6)
Mean Plasma Glucose: 191.51 mg/dL

## 2020-07-11 LAB — LIPID PANEL
Cholesterol: 71 mg/dL (ref 0–200)
HDL: 13 mg/dL — ABNORMAL LOW (ref >40)
LDL Cholesterol: 45 mg/dL (ref 0–99)
Total CHOL/HDL Ratio: 5.5 RATIO
Triglycerides: 65 mg/dL (ref <150)
VLDL: 13 mg/dL (ref 0–40)

## 2020-07-11 LAB — ECHOCARDIOGRAM COMPLETE
Area-P 1/2: 5.94 cm2
Calc EF: 39.9 %
Height: 71 in
S' Lateral: 4.4 cm
Single Plane A2C EF: 40.6 %
Single Plane A4C EF: 42.3 %
Weight: 2836 oz

## 2020-07-11 LAB — LACTIC ACID, PLASMA
Lactic Acid, Venous: 4.9 mmol/L (ref 0.5–1.9)
Lactic Acid, Venous: 3.6 mmol/L (ref 0.5–1.9)

## 2020-07-11 LAB — TROPONIN I (HIGH SENSITIVITY): Troponin I (High Sensitivity): 19 ng/L — ABNORMAL HIGH (ref <18)

## 2020-07-11 LAB — HEMOGLOBIN AND HEMATOCRIT, BLOOD
HCT: 29.7 % — ABNORMAL LOW (ref 39.0–52.0)
Hemoglobin: 9 g/dL — ABNORMAL LOW (ref 13.0–17.0)

## 2020-07-11 LAB — PREPARE RBC (CROSSMATCH)

## 2020-07-11 MED ORDER — PERFLUTREN LIPID MICROSPHERE
1.0000 mL | INTRAVENOUS | Status: AC | PRN
  Administered 2020-07-11: 2 mL via INTRAVENOUS
  Filled 2020-07-11: qty 10

## 2020-07-11 MED ORDER — SODIUM CHLORIDE 0.9% IV SOLUTION: Freq: Once | INTRAVENOUS | Status: AC

## 2020-07-11 MED ORDER — CHLORHEXIDINE GLUCONATE CLOTH 2 % EX PADS
6.0000 | MEDICATED_PAD | Freq: Every day | CUTANEOUS | Status: DC
  Administered 2020-07-11 – 2020-07-19 (×7): 6 via TOPICAL

## 2020-07-11 NOTE — Consult Note (Addendum)
Cardiology Consultation:   Patient ID: Soloman Dunnam Jr. MRN: [id number]; DOB: 06/30/1971  Admit date: 07/09/2020 Date of Consult: 07/11/2020  Primary Care Provider: Meccariello, Bailey J, DO [facility] Cardiologist: James Hochrein, MD  [facility] Electrophysiologist:  None    Patient Profile:   Braidon Matsushita Jr. is a 49 y.o. male with a hx of  who is being seen today for the evaluation of bradycardia and hypotension at the request of CCM.   History of Present Illness:   Mr. Rademaker is an unfortunate 49 y/o AA male, homless with a hx of CADgoing back to 2007. Records indicate he had an MI in 2007 treated with an LAD and circumflex DES. He had a catheterization in 2011 and again in 2013 that showed patent stents with moderate residual coronary disease. He has had a depressed EF- by echo.Other medical issues include insulin-dependent diabetes treated dyslipidemia,and history of anxiety PTSD and homelessness.   He was admitted to the hospital 02/17/2020 with critical limb ischemia.CTA showed nearly occlusive thrombosis within bilateral femoral arteries. Dr. Brabham, was consulted and performed anurgent bilateral iliofemoral thrombectomy and left lower extremity fasciotomy7/10.Unfortunately, revascularization and return of neurological function on the left was not achieved, so that evening he returned to the OR for successful recanalization of left popliteal artery, posterior tibial artery, and closure of the LLE fasciotomy.  An echocardiogram was done during that admission which showed patient now had an ejection fraction of 25% with apical akinesis. It is felt that he had an apical thrombus that caused his lower extremity embolism. After surgery he was placed on aspirin and Plavix, he was then placed on Eliquis 5 mg twice daily. The patient was discharged 02/16/2020.    After discussion with Dr Brabham it was decided the patient would transition to ASA and  Eliquis at the end of October.  The patient was contacted 06/14/2020 by phone for follow up.  He was doing well from a cardiac standpoint on Coreg, Entresto, Eliquis and aspirin.  He was admitted by dr Brabham 07/09/2020 for PV angiogram.  He had an occluded Rt popliteal artery and an 80% Lt popliteal artery.  On 07/10/2020 he had Rt Fem-Pop and Lt popliteal PTA.  Early this morning he developed hypotension and bradycardia.  His Hgb had dropped to 6.7 and he had been on Coreg 25 mg BID and Entresto pre op.  He was transfused 2 units (Hgb now 9.0) and hydrated.  Currently he is awake and alert.  He denies any chest pain or SOB.  His HR in NSR in the 70's and B/P 130 systolic.      Past Medical History:  Diagnosis Date  . Anxiety   . Bipolar disorder (HCC)   . CAD (coronary artery disease)    Anterior MI 2007 with DES stenting of an occluded LAD, distal 70% stenosis, OM 60% stenosis, circumflex 90% stenosis treated with a drug-eluting stent, PA 90% stenosis treated medically.  Cath January 2013 70-80% diagonal stenosis in a small vessel, OM 50% stenosis, PDA long 75% stenosis. EF 45%. He was managed medically.  . Cardiomyopathy, ischemic    EF 45%  . COVID-26 February 2020   . Diabetes mellitus   . History of chest pain 11/25/2010  . Hyperlipidemia   . Hypertension   . Myocardial infarction (HCC)   . PTSD (post-traumatic stress disorder)   . PVD (peripheral vascular disease) (HCC)   . Tuberculosis    1989, took pills for it       Past Surgical History:  Procedure Laterality Date  . ABDOMINAL AORTAGRAM  07/09/2020   LOWER EXTREMITY  . ABDOMINAL AORTOGRAM W/LOWER EXTREMITY Right 07/09/2020   Procedure: ABDOMINAL AORTOGRAM W/LOWER EXTREMITY;  Surgeon: Brabham, Vance W, MD;  [facility];  Service: Cardiovascular;  Laterality: Right;  . ANGIOPLASTY ILLIAC ARTERY Left 07/10/2020   Procedure: BALLOON ANGIOPLASTY OF LEFT POPLITEAL USING RANGER 4.0mm X 40mm OVER-THE-WIRE PTA  BALLOON;  Surgeon: Brabham, Vance W, MD;  Location: [facility];  Service: Vascular;  Laterality: Left;  . APPLICATION OF WOUND VAC Left 02/17/2020   Procedure: APPLICATION OF WOUND VAC;  Surgeon: Brabham, Vance W, MD;  Location: [facility];  Service: Vascular;  Laterality: Left;  . Catherization  08/25/11   Dr. Hochrein  . CORONARY ANGIOPLASTY WITH STENT PLACEMENT  207, 2011   4 stents placed  . DIRECT LARYNGOSCOPY Left 08/14/2019   Procedure: DIRECT LARYNGOSCOPY;  Surgeon: Newman, Christopher E, MD;  Location: [facility];  Service: ENT;  Laterality: Left;  . FASCIOTOMY Left 02/17/2020   Procedure: Four Compartment FASCIOTOMY;  Surgeon: Brabham, Vance W, MD;  [facility]: [facility];  Service: Vascular;  Laterality: Left;  . FASCIOTOMY CLOSURE Left 02/17/2020   Procedure: FASCIOTOMY CLOSURE;  Surgeon: Brabham, Vance W, MD;  [facility];  Service: Vascular;  Laterality: Left;  . FEMORAL-POPLITEAL BYPASS GRAFT Right 07/10/2020   Procedure: RIGHT ABOVE KNEE POPLITEAL TO POSTERIAL TIBIAL ARTERY BYPASS GRAFT;  Surgeon: Brabham, Vance W, MD;  [facility];  Service: Vascular;  Laterality: Right;  . LEFT HEART CATHETERIZATION WITH CORONARY ANGIOGRAM N/A 08/25/2011   Procedure: LEFT HEART CATHETERIZATION WITH CORONARY ANGIOGRAM;  Surgeon: James Hochrein, MD;  [facility]: [facility];  Service: Cardiovascular;  Laterality: N/A;  . LOWER EXTREMITY ANGIOGRAM Bilateral 02/17/2020   Procedure: LOWER EXTREMITY ANGIOGRAM;  Surgeon: Brabham, Vance W, MD;  [facility];  Service: Vascular;  Laterality: Bilateral;  . LOWER EXTREMITY ANGIOGRAM Left 02/17/2020   Procedure: LOWER EXTREMITY ANGIOGRAM WITH Stenting Left Popliteal Artery, Angioplasty posterior Tibial Artery;  Surgeon: Brabham, Vance W, MD;  [facility];  Service: Vascular;  Laterality: Left;  . LOWER EXTREMITY ANGIOGRAM Left 07/10/2020   Procedure: LEFT LOWER EXTREMITY ANGIOGRAM WITH INTERVENTION;  Surgeon: Brabham, Vance W, MD;  [facility];  Service: Vascular;   Laterality: Left;  . MASS BIOPSY Left 08/14/2019   Procedure: BIOPSY EXCISION OF LEFT NECK MASS;  Surgeon: Newman, Christopher E, MD;  [facility];  Service: ENT;  Laterality: Left;  . THROMBECTOMY FEMORAL ARTERY Bilateral 02/17/2020   Procedure: Bilateral  Iliofemoral Thrombectomy and exposure of left lower leg.;  Surgeon: Brabham, Vance W, MD;  [facility];  Service: Vascular;  Laterality: Bilateral;     Home Medications:  Prior to Admission medications   Medication Sig Start Date End Date Taking? Authorizing Provider  apixaban (ELIQUIS) 5 MG TABS tablet Take 1 tablet (5 mg total) by mouth 2 (two) times daily. 03/12/20  Yes Kilroy, Luke K, PA-C  aspirin 81 MG EC tablet Take 1 tablet (81 mg total) by mouth daily. Patient taking differently: Take 81 mg by mouth in the morning and at bedtime. Morning and afternoon 06/14/20  Yes Kilroy, Luke K, PA-C  atorvastatin (LIPITOR) 40 MG tablet Take 1 tablet (40 mg total) by mouth at bedtime. 03/12/20  Yes Kilroy, Luke K, PA-C  carvedilol (COREG) 25 MG tablet Take 1 tablet (25 mg total) by mouth 2 (two) times daily with a meal. 04/19/20  Yes Hochrein, James,   MD  citalopram (CELEXA) 20 MG tablet Take 1 tablet (20 mg total) by mouth 2 (two) times daily. 10/26/19  Yes Lockamy, Timothy, DO  clopidogrel (PLAVIX) 75 MG tablet Take 1 tablet (75 mg total) by mouth daily. 03/12/20  Yes Kilroy, Luke K, PA-C  empagliflozin (JARDIANCE) 25 MG TABS tablet Take 25 mg by mouth daily. 12/01/19  Yes Lockamy, Timothy, DO  furosemide (LASIX) 40 MG tablet Take 1 tablet (40 mg total) by mouth daily. 05/03/20 05/03/21 Yes Sofia, Leslie K, PA-C  insulin glargine (LANTUS) 100 UNIT/ML Solostar Pen Inject 20 Units into the skin every morning. Patient taking differently: Inject 5 Units into the skin daily.  05/06/20  Yes Meccariello, Bailey J, DO  metFORMIN (GLUCOPHAGE) 1000 MG tablet Take 1,000 mg by mouth 2 (two) times daily. 11/21/19  Yes [provider]  sacubitril-valsartan  (ENTRESTO) 24-26 MG Take 1 tablet by mouth 2 (two) times daily. 03/12/20  Yes Kilroy, Luke K, PA-C  spironolactone (ALDACTONE) 25 MG tablet Take 0.5 tablets (12.5 mg total) by mouth daily. 03/12/20  Yes Kilroy, Luke K, PA-C  ARIPiprazole (ABILIFY) 5 MG tablet Take 1 tablet (5 mg total) by mouth daily. 05/22/20   Meccariello, Bailey J, DO  Blood Glucose Monitoring Suppl (ONE TOUCH ULTRA 2) W/DEVICE KIT Check sugar once daily. ICD10 E11.65 01/24/15   Wight, Andrew M, MD  Continuous Glucose Monitor KIT 1 kit by Does not apply route daily. 02/20/19   Abraham, Sherin, DO  ferrous sulfate 325 (65 FE) MG tablet Take 325 mg by mouth daily with breakfast.    [provider]  glucose blood (ONE TOUCH ULTRA TEST) test strip Check sugar once daily in the morning. ICD10 E11.65 01/10/18   Mikell, Asiyah Zahra, MD  Multiple Vitamins-Minerals (MULTIVITAMIN WITH MINERALS) tablet Take 1 tablet by mouth daily.    [provider]  nitroGLYCERIN (NITROSTAT) 0.4 MG SL tablet Place 1 tablet (0.4 mg total) under the tongue every 5 (five) minutes as needed for chest pain. 10/07/19   Lockamy, Timothy, DO  ONE TOUCH LANCETS MISC Use to check sugar once daily. ICD10 E11.65 01/09/15   Breen, James O, MD  traZODone (DESYREL) 50 MG tablet Take 50 mg by mouth at bedtime.    [provider]  triamcinolone cream (KENALOG) 0.1 % Apply 1 application topically 2 (two) times daily. 08/01/19   Bland, Scott, DO    Inpatient Medications: Scheduled Meds: . ARIPiprazole  5 mg Oral Daily  . aspirin EC  81 mg Oral Daily  . atorvastatin  40 mg Oral QHS  . Chlorhexidine Gluconate Cloth  6 each Topical Daily  . citalopram  20 mg Oral BID  . docusate sodium  100 mg Oral Daily  . ferrous sulfate  325 mg Oral Q breakfast  . heparin  5,000 Units Subcutaneous Q8H  . insulin aspart  0-9 Units Subcutaneous TID WC  . multivitamin with minerals  1 tablet Oral Daily  . pantoprazole  40 mg Oral Daily  . traZODone  50 mg Oral QHS    Continuous Infusions: . sodium chloride    . sodium chloride 100 mL/hr at 07/10/20 2327  . magnesium sulfate bolus IVPB     PRN Meds: sodium chloride, acetaminophen **OR** acetaminophen, alum & mag hydroxide-simeth, bisacodyl, guaiFENesin-dextromethorphan, hydrALAZINE, magnesium sulfate bolus IVPB, metoprolol tartrate, nitroGLYCERIN, ondansetron, oxyCODONE-acetaminophen, perflutren lipid microspheres (DEFINITY) IV suspension, phenol, potassium chloride, senna-docusate, sodium phosphate  Allergies:    Allergies  Allergen Reactions  . Shrimp [Shellfish Allergy] Swelling      tongue    Social History:   Social History   Socioeconomic History  . Marital status: Single    Spouse name: Not on file  . Number of children: Not on file  . Years of education: Not on file  . Highest education level: Not on file  Occupational History  . Not on file  Tobacco Use  . Smoking status: Never Smoker  . Smokeless tobacco: Never Used  Vaping Use  . Vaping Use: Never used  Substance and Sexual Activity  . Alcohol use: No  . Drug use: No  . Sexual activity: Not on file  Other Topics Concern  . Not on file  Social History Narrative  . Not on file   Social Determinants of Health   Financial Resource Strain:   . Difficulty of Paying Living Expenses: Not on file  Food Insecurity:   . Worried About Running Out of Food in the Last Year: Not on file  . Ran Out of Food in the Last Year: Not on file  Transportation Needs:   . Lack of Transportation (Medical): Not on file  . Lack of Transportation (Non-Medical): Not on file  Physical Activity:   . Days of Exercise per Week: Not on file  . Minutes of Exercise per Session: Not on file  Stress:   . Feeling of Stress : Not on file  Social Connections:   . Frequency of Communication with Friends and Family: Not on file  . Frequency of Social Gatherings with Friends and Family: Not on file  . Attends Religious Services: Not on file  . Active  Member of Clubs or Organizations: Not on file  . Attends Club or Organization Meetings: Not on file  . Marital Status: Not on file  Intimate Partner Violence:   . Fear of Current or Ex-Partner: Not on file  . Emotionally Abused: Not on file  . Physically Abused: Not on file  . Sexually Abused: Not on file    Family History:    Family History  Problem Relation Age of Onset  . Hypertension Mother   . Hypertension Father   . Diabetes Maternal Aunt   . Diabetes Maternal Uncle   . Cancer Maternal Grandmother      ROS:  Please see the history of present illness.  All other ROS reviewed and negative.     Physical Exam/Data:   Vitals:   07/11/20 0745 07/11/20 0800 07/11/20 0900 07/11/20 1000  BP:  91/64 102/70 (!) 121/101  Pulse:  (!) 45 (!) 131 60  Resp: 12 17 13 16  Temp:      TempSrc:  Oral    SpO2:  95% (!) 78% 91%  Weight:      Height:        Intake/Output Summary (Last 24 hours) at 07/11/2020 1254 Last data filed at 07/11/2020 0800 Gross per 24 hour  Intake 3564.83 ml  Output 1065 ml  Net 2499.83 ml   Last 3 Weights 07/11/2020 07/10/2020 07/09/2020  Weight (lbs) 177 lb 4 oz 165 lb 165 lb  Weight (kg) 80.4 kg 74.844 kg 74.844 kg     Body mass index is 24.72 kg/m.  General:  Well nourished, well developed, in no acute distress HEENT: normal Lymph: no adenopathy Neck: no JVD Endocrine:  No thryomegaly Vascular: LE dressing dry and intact Cardiac:  normal S1, S2; RRR; no murmur  Lungs:  clear to auscultation bilaterally, no wheezing, rhonchi or rales  Abd: soft, nontender, no hepatomegaly  Ext:   no edema Musculoskeletal:  No deformities, BUE and BLE strength normal and equal Skin: warm and dry  Neuro:  CNs 2-12 intact, no focal abnormalities noted Psych:  Normal affect   EKG:  The EKG 07/11/2020 at 9 am was personally reviewed and demonstrates:  junctional rhythm-HR 50, inferior Qs (old).  Telemetry:  Telemetry was personally reviewed and demonstrates:   Currently NSR-70's  Relevant CV Studies:  Echo 02/19/2020- IMPRESSIONS    1. Left ventricular ejection fraction, by estimation, is 25 to 30%. The  left ventricle has severely decreased function. The left ventricle  demonstrates global hypokinesis. There is mild left ventricular  hypertrophy. Left ventricular diastolic parameters  are consistent with Grade I diastolic dysfunction (impaired relaxation).  2. Right ventricular systolic function is severely reduced. The right  ventricular size is moderately enlarged. There is moderately elevated  pulmonary artery systolic pressure.  3. The mitral valve is normal in structure. No evidence of mitral valve  regurgitation. No evidence of mitral stenosis.  4. Tricuspid valve regurgitation is moderate.  5. The aortic valve is tricuspid. Aortic valve regurgitation is not  visualized. No aortic stenosis is present.  6. The inferior vena cava is normal in size with greater than 50%  respiratory variability, suggesting right atrial pressure of 3 mmHg.   Laboratory Data:  High Sensitivity Troponin:   Recent Labs  Lab 07/11/20 0849  TROPONINIHS 19*     Chemistry Recent Labs  Lab 07/10/20 1502 07/10/20 1502 07/10/20 1508 07/11/20 0133 07/11/20 0849  NA 138   < > 139 136 133*  K 4.4   < > 4.4 4.6 5.2*  CL 104  --   --  104 105  CO2  --   --   --  22 16*  GLUCOSE 198*  --   --  230* 267*  BUN 9  --   --  12 16  CREATININE 0.80  --   --  0.86 1.25*  CALCIUM  --   --   --  8.4* 8.1*  GFRNONAA  --   --   --  >60 >60  ANIONGAP  --   --   --  10 12   < > = values in this interval not displayed.    Recent Labs  Lab 07/11/20 0849  PROT 6.6  ALBUMIN 2.4*  AST 27  ALT 17  ALKPHOS 55  BILITOT 1.6*   Hematology Recent Labs  Lab 07/10/20 1508 07/11/20 0133 07/11/20 0849  WBC  --  10.3  --   RBC  --  3.54*  --   HGB 10.5* 6.7* 9.0*  HCT 31.0* 23.0* 29.7*  MCV  --  65.0*  --   MCH  --  18.9*  --   MCHC  --  29.1*  --    RDW  --  24.3*  --   PLT  --  275  --    BNPNo results for input(s): BNP, PROBNP in the last 168 hours.  DDimer No results for input(s): DDIMER in the last 168 hours.   Radiology/Studies:  PERIPHERAL VASCULAR CATHETERIZATION  Result Date: 07/09/2020 Patient name: Xabi Adler Jr. MRN: [id number] DOB: 11/22/1970 Sex: male 07/09/2020 Pre-operative Diagnosis: Bilateral wrist pain Post-operative diagnosis:  Same Surgeon:  Wells Brabham Procedure Performed:  1.  Ultrasound-guided access, left femoral artery  2.  Abdominal aortogram  3.  Second-order catheterization  4.  Bilateral lower extremity runoff  5.  Conscious sedation, 16 minutes Indications: The patient comes in   for angiography.  He has previously undergone bilateral embolectomy and stent to the left popliteal artery.  He is having rest pain Procedure:  The patient was identified in the holding area and taken to room 8.  The patient was then placed supine on the table and prepped and draped in the usual sterile fashion.  A time out was called.  Conscious sedation was administered with the use of IV fentanyl and Versed under continuous physician and nurse monitoring.  Heart rate, blood pressure, and oxygen saturation were continuously monitored.  Total sedation time was 16 minutes.  Ultrasound was used to evaluate the left common femoral artery.  It was patent .  A digital ultrasound image was acquired.  A micropuncture needle was used to access the left common femoral artery under ultrasound guidance.  An 018 wire was advanced without resistance and a micropuncture sheath was placed.  The 018 wire was removed and a benson wire was placed.  The micropuncture sheath was exchanged for a 5 french sheath.  An omniflush catheter was advanced over the wire to the level of L-1.  An abdominal angiogram was obtained.  Next, using the omniflush catheter and a benson wire, the aortic bifurcation was crossed and the catheter was placed into theright external  iliac artery and right runoff was obtained.  left runoff was performed via retrograde sheath injections. Findings:  Aortogram: No significant renal artery stenosis was identified.  The infrarenal abdominal aorta is widely patent without stenosis.  Bilateral common and external iliac arteries are widely patent.  Right Lower Extremity: The right common femoral and profundofemoral artery are widely patent.  There is mild calcific disease in the adductor canal.  The popliteal artery occludes above the knee with reconstitution of the distal below-knee popliteal artery and single-vessel runoff via the posterior tibial artery  Left Lower Extremity: The left common femoral and profundofemoral artery are widely patent.  The superficial femoral artery is patent.  There is a stent within the popliteal artery that ends just above the joint space.  At the joint space there is a high-grade approximate 80% focal stenosis.  There is single-vessel runoff via the posterior tibial artery Intervention: None Impression:  #1  High-grade left popliteal artery stenosis  #2  Occluded right popliteal artery  #3  Single-vessel runoff bilaterally via the posterior tibial artery  #4  We will undergo right femoral-popliteal bypass grafting and percutaneous intervention to the left popliteal artery  V. Annamarie Major, M.D., [facility] [place] Office: [phone] Pager:  [phone]  VAS [place] LOWER EXTREMITY SAPHENOUS VEIN MAPPING  Result Date: 07/09/2020 LOWER EXTREMITY VEIN MAPPING Indications:        Pre-op Other Indications:  Ischemia, critical limb Risk Factors:       Diabetes, PAD. Other Risk Factors: History of left popliteal stent. Right popliteal occlusion.  Performing Technologist: Oda Cogan RDMS, RVT  Examination Guidelines: A complete evaluation includes B-mode imaging, spectral Doppler, color Doppler, and power Doppler as needed of all accessible portions of each vessel. Bilateral testing is  considered an integral part of a complete examination. Limited examinations for reoccurring indications may be performed as noted. +--------------+-----------+--------------------+--------------+---------------+  RT Diameter  RT Findings        GSV          LT Diameter    LT Findings        (cm)                                         (  cm)                     +--------------+-----------+--------------------+--------------+---------------+      0.39                   Saphenofemoral        0.64                                                    Junction                                    +--------------+-----------+--------------------+--------------+---------------+      0.46      branching    Proximal thigh        0.42                     +--------------+-----------+--------------------+--------------+---------------+      0.36                     Mid thigh           0.35      branching and                                                                 chronic                                                                   thrombus     +--------------+-----------+--------------------+--------------+---------------+      0.38                    Distal thigh         0.38        branching    +--------------+-----------+--------------------+--------------+---------------+      0.29                        Knee             0.27                     +--------------+-----------+--------------------+--------------+---------------+      0.37      branching      Prox calf           0.27                     +--------------+-----------+--------------------+--------------+---------------+      0.19                      Mid calf           0.20                     +--------------+-----------+--------------------+--------------+---------------+  0.21                    Distal calf          0.21                      +--------------+-----------+--------------------+--------------+---------------+      0.14                       Ankle             0.21                     +--------------+-----------+--------------------+--------------+---------------+ Diagnosing physician: Deitra Mayo MD Electronically signed by Deitra Mayo MD on 07/09/2020 at 7:56:45 PM.    Final    HYBRID OR IMAGING ([place])  Result Date: 07/10/2020 There is no interpretation for this exam.  This order is for images obtained during a surgical procedure.  Please See "Surgeries" Tab for more information regarding the procedure.     Assessment and Plan:   Transient post op hypotension and bradycardia- I suspect this was a combination of post op anemia, volume depletion, and the added effect of his home medications.  He has responded to blood transfusion and IVF.  His Entresto, lasix, and Coreg are on hold.   PVD- Urgent LEA surgery 02/17/2020 secondary to bilateral femoral artery occlusion secondary to apical thrombus.  .  S/P Rt Fem-Pop and Lt popliteal PTA 07/19/2020  CAD (coronary artery disease) Anterior MI 2007 with DES stenting of an occluded LAD, circumflex 90% stenosis treated with a drug-eluting stent  Catheterization done in January 2013 -medical Rx   Diabetes mellitus type 2, uncontrolled ([place]) IDDM- per primary team  Cardiomyopathy [facility]) New drop in EF July 2021- EF 25% with apical AK.  He has been compliant with his medications including Entresto.  He has a follow-up echo scheduled for January 2022.   Anticoagulated Pt placed on ASA and plavix after vascular surgery- Eliquis added for suspected apical thrombus emboli.    Chronic anemia Chronic microcytic anemia- Hgb 9 at baseline, dropped to 6.7 post op.  Homelessness- Recurrent problem.   p   Plan- Continue to hold Entresto, Coreg, and Lasix.  Back off on IVF when taking PO.  Check EKG in AM      For questions or updates,  please contact [facility] Please consult [email or web address] for contact info under    Signed, Kerin Ransom, PA-C  07/11/2020 12:54 PM   Patient seen and examined and agree with Kerin Ransom, PA-C as detailed above.  In brief, the patient is a 49 year old male with history of CAD s/p PCI to LAD and LCx, chronic systolic heart failure with LVEF 25%, mdoerately enlarged RV with severe RV dysfunction, PAD s/p Rt Fem-Pop and Lt popliteal PTA who presented for planned PV angiogram and peripheral intervention as detailed above with post-op course complicated by hypotension, anemia and bradycardia requiring transfer to the ICU. Cardiology is now consulted for concern for junctional bradycardia with HR 30s.   Currently, patient is in NSR with HR 70s. HD stable with SBP 130s. Home coreg, entresto and lasix are being held.  Exam: GEN: Laying comfortably in bed, NAD  Neck: No JVD Cardiac: RRR, no murmurs, rubs, or gallops.  Respiratory: Clear to auscultation bilaterally. GI: Soft, nontender, non-distended  MS: No edema; No deformity. Left leg incision site with dressing in place and is c/d/i Neuro:  Nonfocal  Psych: Normal affect   Plan: - Hold nodal agents for now. Junctional escape transient in the setting of post-op anemia, hypotension, anesthesia medications, and home meds. Given hypotension and bradycardia, vagal response is also a possibility. Lower suspicion of underlying conduction disease. Once more clinically stable, will trial back on home BB and monitor  - Hold entresto and lasix for now; will resume once more clinically stable - IVC appears enlarged on TTE so will need to be judicious with volume resucitation. Plan to resume on lasix tomorrow if stable. If any further blood products administered, recommend giving lasix 4m IV afterwards -TTE with mildly improved EF 30-35%, distal septal and apical akinesis (consistent with prior), moderately enlarged and hypokinetic RV--overall  stable -Management of PAD per Vascular  HGwyndolyn Kaufman MD

## 2020-07-11 NOTE — Progress Notes (Signed)
[facility] [place]  Subjective  - Alert and following commands   Objective (!) 121/101 60 98.5 F (36.9 C) (Axillary) 16 91%  Intake/Output Summary (Last 24 hours) at 07/11/2020 1126 Last data filed at 07/11/2020 0800 Gross per 24 hour  Intake 4564.83 ml  Output 1265 ml  Net 3299.83 ml    Doppler signals PT B LE Groin soft without hematoma, right LE incision intact with covers, small bloody drainage on dressing, no active bleeding Lungs O2 support non labored Heart Regular, brady, BP systolic 120's without pressor support   Assessment/Planning: POD # 1  Procedure:   #1: Right above-knee popliteal to posterior tibial artery bypass graft with nonreversed ipsilateral translocated saphenous vein                         #2: Ultrasound-guided access, right femoral artery                         #3: Drug-coated balloon angioplasty, left posterior tibial artery  Troponin elevated 19, Echo ordered, junctional rhythm noted with HR in the 30's at time.  Cardiology consult called per CCM Brady cardia anti hypertensive's on hold HGB 9.0 after 2 units PRBC surgical blood loss anemia   Mosetta Pigeon 07/11/2020 11:26 AM --  Laboratory Lab Results: Recent Labs    07/11/20 0133 07/11/20 0849  WBC 10.3  --   HGB 6.7* 9.0*  HCT 23.0* 29.7*  PLT 275  --    BMET Recent Labs    07/11/20 0133 07/11/20 0849  NA 136 133*  K 4.6 5.2*  CL 104 105  CO2 22 16*  GLUCOSE 230* 267*  BUN 12 16  CREATININE 0.86 1.25*  CALCIUM 8.4* 8.1*    COAG Lab Results  Component Value Date   INR 1.1 (H) 08/24/2011   No results found for: PTT

## 2020-07-11 NOTE — Significant Event (Addendum)
Rapid Response Event Note   Reason for Call : Hypotension Initial Focused Assessment:  Notified by staff pt is hypotensive in 70s (79/52). Pt has been hypotensive in the 80s since 0245. Dr. Myra Gianotti notified at [phone]. Order received for 2 units PRBCs at 0437.  Pt is alert and c/o dizziness. No obvious signs of bleeding.      Interventions:  -Staff instructed to receive and administer blood ASAP   Call Time: 0440 Arrival Time: 0452 End Time: 0525   Addendum: 0621- Assisted with transfer of patient to ICU  Rose Fillers, RN

## 2020-07-11 NOTE — Progress Notes (Signed)
Page sent to Myra Gianotti, MD regarding patients BP, HR, and hemoglobin.   BP 81/62 (70) HR 40's-50's Hemoglobin 6.7

## 2020-07-11 NOTE — Progress Notes (Signed)
Rapid Response Nurse called.

## 2020-07-11 NOTE — Progress Notes (Signed)
PT Cancellation Note  Patient Details Name: Paul [name]. MRN: [id number] DOB: 30-Nov-1970   Cancelled Treatment:    Reason Eval/Treat Not Completed: Medical issues which prohibited therapy. PT eval received, chart reviewed. Noted events of the morning and transfer to ICU. Will hold PT evaluation at this time and follow-up to assess for medical readiness to participate.    Marylynn Pearson 07/11/2020, 7:53 AM   Conni Slipper, PT, DPT Acute Rehabilitation Services Pager: [phone] Office: [phone]

## 2020-07-11 NOTE — Progress Notes (Addendum)
Subjective  - POD #status post right above-knee popliteal to posterior tibial bypass graft with vein and left popliteal angioplasty  The patient had issues with hypotension and bradycardia overnight.  He was found to be anemic with a hemoglobin of 6.7 this morning.  He received 2 units of blood but remained mildly hypotensive, therefore he was transferred to the unit.  He denies any chest pain.  He says his leg feels tight but is not hurting him   Physical Exam:  Incisions are clean dry and intact.  He has bilateral posterior tibial Doppler signals.       Assessment/Plan:  POD #1  Hypotension: The patient has symptomatic anemia, for which she was transfused 2 units of blood.  He does have a history of decreased ejection fraction with apical akinesis secondary to thrombus.  This could be contributing.  Will be transferred to the unit for closer monitoring.  Cardiac: Twelve-lead EKG and high-sensitivity troponins have been ordered.  These were found to be elevated and so cardiology has been consulted.  Given his history of bilateral lower extremity embolic event over the summer from apical thrombus, I will need to get him back on anticoagulation once his anemia has been addressed, likely tomorrow.  Vascular: The patient's bypass graft appears to be patent by exam.  I did use a drug-coated balloon on the left popliteal artery, so he will also need aspirin and Plavix for at least 1 month.  Hypercholesterolemia.  The patient is currently on a statin.    Wells Gyasi Hazzard 07/11/2020 2:45 PM --  Vitals:   07/11/20 1200 07/11/20 1400  BP: (!) 138/94 (!) 155/88  Pulse: 75 75  Resp: 15 (!) 0  Temp:    SpO2: (!) 89% 95%    Intake/Output Summary (Last 24 hours) at 07/11/2020 1445 Last data filed at 07/11/2020 1400 Gross per 24 hour  Intake 4439.83 ml  Output 1090 ml  Net 3349.83 ml     Laboratory CBC    Component Value Date/Time   WBC 10.3 07/11/2020 0133   HGB 9.0 (L)  07/11/2020 0849   HGB 9.1 (L) 06/28/2020 0946   HCT 29.7 (L) 07/11/2020 0849   HCT 33.4 (L) 06/28/2020 0946   PLT 275 07/11/2020 0133   PLT 335 06/28/2020 0946    BMET    Component Value Date/Time   NA 133 (L) 07/11/2020 0849   NA 136 06/28/2020 0946   K 5.2 (H) 07/11/2020 0849   CL 105 07/11/2020 0849   CO2 16 (L) 07/11/2020 0849   GLUCOSE 267 (H) 07/11/2020 0849   BUN 16 07/11/2020 0849   BUN 9 06/28/2020 0946   CREATININE 1.25 (H) 07/11/2020 0849   CALCIUM 8.1 (L) 07/11/2020 0849   GFRNONAA >60 07/11/2020 0849   GFRAA 110 06/28/2020 0946    COAG Lab Results  Component Value Date   INR 1.1 (H) 08/24/2011   No results found for: PTT  Antibiotics Anti-infectives (From admission, onward)   Start     Dose/Rate Route Frequency Ordered Stop   07/10/20 2359  ceFAZolin (ANCEF) IVPB 2g/100 mL premix        2 g 200 mL/hr over 30 Minutes Intravenous Every 8 hours 07/10/20 2122 07/11/20 1136   07/10/20 1003  ceFAZolin (ANCEF) 2-4 GM/100ML-% IVPB       Note to Pharmacy: [name]   : cabinet override      07/10/20 1003 07/10/20 2214       V. Durene Cal  IV, M.D., [facility] [place] Office: [phone] Pager:  [phone]

## 2020-07-11 NOTE — Progress Notes (Signed)
OT Cancellation Note  Patient Details Name: Paul [name]. MRN: [id number] DOB: 1971-07-19   Cancelled Treatment:    Reason Eval/Treat Not Completed: Medical issues which prohibited therapy (Pt transferred to ICU this am. Will follow.)  Evern Bio 07/11/2020, 8:02 AM  Martie Round, OTR/L Acute Rehabilitation Services Pager: [phone] Office: [phone]

## 2020-07-11 NOTE — Progress Notes (Signed)
  Echocardiogram 2D Echocardiogram with definity has been performed.  Leta Jungling M 07/11/2020, 12:34 PM

## 2020-07-11 NOTE — Progress Notes (Signed)
Brabham, MD notified of patients persistent HR in the 30's and 70 systolic blood pressure.   New orders:  Place transfer orders to ICU, obtain EKG.

## 2020-07-11 NOTE — Consult Note (Addendum)
NAME:  Paul [name]., MRN:  [id number], DOB:  27-Mar-1971, LOS: 1 ADMISSION DATE:  07/09/2020, CONSULTATION DATE: 12/2 REFERRING MD:  Dr. Trula Slade, CHIEF COMPLAINT:  Hypotension   Brief History   49 y/o M admitted for R above the knee popliteal to posterior tibial artery bypass graft with non-reversed ipsilateral translocated saphenous vein and drug coated balloon angioplasty of the left posterior tibial artery.  Post operatively developed hypotension and bradycardia in the setting of anemia, transferred to ICU 12/2 early am.   History of present illness   49 y/o M who presented to [facility] on 11/30 for planned intervention for RLE after angiogram on 11/30 showed right popliteal occlusion and right popliteal stenosis.   The patient is status post bilateral lower extremity thrombectomy for acute ischemia in 02/2020 after COVID illness.  He returned 11/30 with reports of resting bilateral leg pain.  Angiogram was completed and showed right popliteal occlusion and right popliteal stenosis. On 12/1 he underwent a right above the knee popliteal to posterior tibial artery bypass graft with non-reversed ipsilateral translocated saphenous vein and drug coated balloon angioplasty of the left posterior tibial artery.  Post operatively, he developed hypotension and bradycardia.  Rapid response team evaluated the patient around 0100 on 12/2 and he was transferred to the ICU.  Labs at that time reflected a Hgb of 6.7 (pre-op Hgb 11.6).  He was transfused PRBC with improvement in HR and blood pressure.    PCCM consulted for evaluation 12/2.    Past Medical History  COVID - July 2021  Tuberculosis -1989, treated  PTSD Bipolar Disorder  Anxiety  DM  HTN  HLD CAD PVD   Significant Hospital Events   11/30 Admit  12/01 R above the knee popliteal to posterior tibial artery bypass graft with non-reversed ipsilateral translocated saphenous vein and drug coated balloon angioplasty of the left posterior  tibial artery.   12/02 Tx to ICU with hypotension, bradycardia   Consults:    Procedures:  R Radial ALine 12/1 >>   Significant Diagnostic Tests:     Micro Data:  COVID 11/26 >> negative  MRSA PCR 12/1 >> negative    Antimicrobials:  Cefazolin 12/1 for OR  Interim history/subjective:  Pt denies chest pain, SOB Afebrile  On 2L [place]  RN reports difficulty picking up sats due to PVD, labs pending post transfusion   Objective   Blood pressure (!) 82/59, pulse (!) 39, temperature 98.5 F (36.9 C), temperature source Axillary, resp. rate 16, height _0  (1.803 m), weight 80.4 kg, SpO2 94 %.        Intake/Output Summary (Last 24 hours) at 07/11/2020 0813 Last data filed at 07/11/2020 0640 Gross per 24 hour  Intake 4204.83 ml  Output 1265 ml  Net 2939.83 ml   Filed Weights   07/09/20 0654 07/10/20 0935 07/11/20 0700  Weight: 74.8 kg 74.8 kg 80.4 kg    Examination: General: adult male lying in bed in NAD PSY: flat affect HEENT: MM pink/moist, [place] O2, anicteric, fair dentition  Neuro: Awake, alert, oriented to self, events, MAE CV: s1s2 regular, brady, no m/r/g, doppler + pulses  PULM: non-labored on [place] O2, lungs bilaterally clear  GI: soft, bsx4 active  Extremities: warm/dry, no edema  Skin: RLE surgical incisions c/d/i, small amt bloody drainage on upper extremity dressing, leg soft / no evidence of hematoma  Resolved Hospital Problem list     Assessment & Plan:   Hypotension  Bradycardia Hx HTN, HLD, Ischemic Cardiomyopathy  Suspect multifactorial in setting of post-op anemia, hypovolemia, chronic HTN regimen / diuresis and narcotic effect (received fentanyl around [place]). Transfused 2 units PRBC 12/2.  -hold home entresto, lasix, spironolactone, coreg, jardiance  -hold PRN dilaudid, fentanyl, labetalol  -assess EKG now  -assess CMP, CBC, lactic acid, troponin now  -vasopressors if needed for MAP >65  Junctional Rhythm  HR into the 30's, SBP into 70's  overnight 12/2, tx to ICU.  Had fentanyl before events.  -hold beta blockers, home HTN regimen  -tele monitoring  -Cardiology consulted for evaluation -assess ECHO   Right Popliteal to Posterior Tibial Artery Bypass Graft  Drug Coated Balloon Angioplasty of the L Posterior Tibial Artery PVD -post operative care per VVS  -continue ASA  -follow peripheral pulses / exam closely   Acute Blood Loss on Chronic Anemia  Baseline Hgb ~8-9, suspect 11/30 hgb of 11 was erroneous  -transfuse PRBC per VVS -follow up H/H post transfusion  -continue heparin for DVT prophylaxis for now  DM II  -SSI   Bipolar Disorder  Anxiety  PTSD  -continue abilify, celexa, trazodone   Poor Social Situation  Lives in his car, let his apartment lease expire.  Takes medications inconsistently  -TOC consult   Best practice (evaluated daily)  Diet: As tolerated  Pain/Anxiety/Delirium protocol (if indicated): PRN oxycodone  VAP protocol (if indicated): n/a  DVT prophylaxis: Heparin GI prophylaxis: n/a  Glucose control: SSI  Mobility: As tolerated  Last date of multidisciplinary goals of care discussion: pending Family and staff present: n/a  Summary of discussion: n/a  Follow up goals of care discussion due: n/a Code Status: Full Code  Disposition: ICU   Labs   CBC: Recent Labs  Lab 07/09/20 0729 07/11/20 0133  WBC  --  10.3  HGB 11.6* 6.7*  HCT 34.0* 23.0*  MCV  --  65.0*  PLT  --  235    Basic Metabolic Panel: Recent Labs  Lab 07/09/20 0729 07/11/20 0133  NA 139 136  K 3.8 4.6  CL 101 104  CO2  --  22  GLUCOSE 163* 230*  BUN 12 12  CREATININE 1.00 0.86  CALCIUM  --  8.4*   GFR: Estimated Creatinine Clearance: 110.7 mL/min (by C-G formula based on SCr of 0.86 mg/dL). Recent Labs  Lab 07/11/20 0133  WBC 10.3    Liver Function Tests: No results for input(s): AST, ALT, ALKPHOS, BILITOT, PROT, ALBUMIN in the last 168 hours. No results for input(s): LIPASE, AMYLASE in the  last 168 hours. No results for input(s): AMMONIA in the last 168 hours.  ABG    Component Value Date/Time   TCO2 24 07/09/2020 0729     Coagulation Profile: No results for input(s): INR, PROTIME in the last 168 hours.  Cardiac Enzymes: No results for input(s): CKTOTAL, CKMB, CKMBINDEX, TROPONINI in the last 168 hours.  HbA1C: HbA1c, POC (controlled diabetic range)  Date/Time Value Ref Range Status  05/22/2020 11:33 AM 8.1 (A) 0.0 - 7.0 % Final  01/15/2020 03:15 PM 7.7 (A) 0.0 - 7.0 % Final   Hgb A1c MFr Bld  Date/Time Value Ref Range Status  07/11/2020 01:33 AM 8.3 (H) 4.8 - 5.6 % Final    Comment:    (NOTE) Pre diabetes:          5.7%-6.4%  Diabetes:              >6.4%  Glycemic control for   <7.0% adults with diabetes   07/09/2020 06:55 PM  8.2 (H) 4.8 - 5.6 % Final    Comment:    (NOTE) Pre diabetes:          5.7%-6.4%  Diabetes:              >6.4%  Glycemic control for   <7.0% adults with diabetes     CBG: Recent Labs  Lab 07/10/20 0917 07/10/20 0950 07/10/20 1741 07/10/20 2157 07/11/20 0631  GLUCAP 55* 46* 185* 195* 97    Review of Systems: Positives in [place]   Gen: Denies fever, chills, weight change, fatigue, night sweats HEENT: Denies blurred vision, double vision, hearing loss, tinnitus, sinus congestion, rhinorrhea, sore throat, neck stiffness, dysphagia PULM: Denies shortness of breath, cough, sputum production, hemoptysis, wheezing CV: Denies chest pain, edema, orthopnea, paroxysmal nocturnal dyspnea, palpitations, LE pain prior to admit GI: Denies abdominal pain, nausea, vomiting, diarrhea, hematochezia, melena, constipation, change in bowel habits GU: Denies dysuria, hematuria, polyuria, oliguria, urethral discharge Endocrine: Denies hot or cold intolerance, polyuria, polyphagia or appetite change Derm: Denies rash, dry skin, scaling or peeling skin change Heme: Denies easy bruising, bleeding, bleeding gums Neuro: Denies headache,  numbness, weakness, slurred speech, loss of memory or consciousness  Past Medical History  He,  has a past medical history of Anxiety, Bipolar disorder ([place]), CAD (coronary artery disease), Cardiomyopathy, ischemic, Diabetes mellitus, History of chest pain (11/25/2010), Hyperlipidemia, Hypertension, Myocardial infarction ([place]), PTSD (post-traumatic stress disorder), and Tuberculosis.   Surgical History    Past Surgical History:  Procedure Laterality Date  . ABDOMINAL AORTAGRAM  07/09/2020   LOWER EXTREMITY  . ABDOMINAL AORTOGRAM W/LOWER EXTREMITY Right 07/09/2020   Procedure: ABDOMINAL AORTOGRAM W/LOWER EXTREMITY;  Surgeon: Serafina Mitchell, MD;  Location: [facility];  Service: Cardiovascular;  Laterality: Right;  . ANGIOPLASTY ILLIAC ARTERY Left 07/10/2020   Procedure: BALLOON ANGIOPLASTY OF LEFT POPLITEAL USING RANGER 4.59m X 462mOVER-THE-WIRE PTA BALLOON;  Surgeon: BrSerafina MitchellMD;  [facility]: [facility];  Laterality: Left;  . APPLICATION OF WOUND VAC Left 02/17/2020   Procedure: APPLICATION OF WOUND VAC;  Surgeon: BrSerafina MitchellMD;  [facility]: [facility];  Laterality: Left;  . Catherization  08/25/11   Dr. HoPercival Spanish. CORONARY ANGIOPLASTY WITH STENT PLACEMENT  207, 2011   4 stents placed  . DIRECT LARYNGOSCOPY Left 08/14/2019   Procedure: DIRECT LARYNGOSCOPY;  Surgeon: NeRozetta NunneryMD;  [facility]: [facility];  Laterality: Left;  . FASCIOTOMY Left 02/17/2020   Procedure: Four Compartment FASCIOTOMY;  Surgeon: BrSerafina MitchellMD;  [facility];  Laterality: Left;  . FASCIOTOMY CLOSURE Left 02/17/2020   Procedure: FASCIOTOMY CLOSURE;  Surgeon: BrSerafina MitchellMD;  [facility];  Laterality: Left;  . FEMORAL-POPLITEAL BYPASS GRAFT Right 07/10/2020   Procedure: RIGHT ABOVE KNEE POPLITEAL TO POSTERIAL TIBIAL ARTERY BYPASS GRAFT;  Surgeon: BrSerafina MitchellMD;  [facility]  Service: Vascular;  Laterality: Right;  . LEFT HEART CATHETERIZATION WITH CORONARY ANGIOGRAM N/A 08/25/2011   Procedure: LEFT HEART CATHETERIZATION WITH CORONARY ANGIOGRAM;  Surgeon: JaMinus BreedingMD;  [facility];  Service: Cardiovascular;  Laterality: N/A;  . LOWER EXTREMITY ANGIOGRAM Bilateral 02/17/2020   Procedure: LOWER EXTREMITY ANGIOGRAM;  Surgeon: BrSerafina MitchellMD;  [facility];  Service: Vascular;  Laterality: Bilateral;  . LOWER EXTREMITY ANGIOGRAM Left 02/17/2020   Procedure: LOWER EXTREMITY ANGIOGRAM WITH Stenting Left Popliteal Artery, Angioplasty posterior Tibial Artery;  Surgeon: Serafina Mitchell, MD;  Location: [facility] OR;  Service: Vascular;  Laterality: Left;  . LOWER EXTREMITY ANGIOGRAM Left 07/10/2020   Procedure: LEFT LOWER EXTREMITY ANGIOGRAM WITH INTERVENTION;  Surgeon: Serafina Mitchell, MD;  Location: [facility];  Service: Vascular;  Laterality: Left;  Marland Kitchen MASS BIOPSY Left 08/14/2019   Procedure: BIOPSY EXCISION OF LEFT NECK MASS;  Surgeon: Rozetta Nunnery, MD;  Location: [place];  Service: ENT;  Laterality: Left;  . THROMBECTOMY FEMORAL ARTERY Bilateral 02/17/2020   Procedure: Bilateral  Iliofemoral Thrombectomy and exposure of left lower leg.;  Surgeon: Serafina Mitchell, MD;  Location: [facility] OR;  Service: Vascular;  Laterality: Bilateral;     Social History   reports that he has never smoked. He has never used smokeless tobacco. He reports that he does not drink alcohol and does not use drugs.   Family History   His family history includes Cancer in his maternal grandmother; Diabetes in his maternal aunt and maternal uncle; Hypertension in his father and mother.   Allergies Allergies  Allergen Reactions  . Shrimp [Shellfish Allergy] Swelling    tongue     Home Medications  Prior to Admission medications   Medication Sig Start Date End Date Taking? Authorizing Provider  apixaban (ELIQUIS) 5 MG TABS tablet Take 1 tablet (5 mg total) by mouth 2 (two) times  daily. 03/12/20  Yes Kilroy, Doreene [place], PA-C  aspirin 81 MG EC tablet Take 1 tablet (81 mg total) by mouth daily. Patient taking differently: Take 81 mg by mouth in the morning and at bedtime. Morning and afternoon 06/14/20  Yes Kilroy, Luke K, PA-C  atorvastatin (LIPITOR) 40 MG tablet Take 1 tablet (40 mg total) by mouth at bedtime. 03/12/20  Yes Kilroy, Luke K, PA-C  carvedilol (COREG) 25 MG tablet Take 1 tablet (25 mg total) by mouth 2 (two) times daily with a meal. 04/19/20  Yes Hochrein, Jeneen Rinks, MD  citalopram (CELEXA) 20 MG tablet Take 1 tablet (20 mg total) by mouth 2 (two) times daily. 10/26/19  Yes Nuala Alpha, DO  clopidogrel (PLAVIX) 75 MG tablet Take 1 tablet (75 mg total) by mouth daily. 03/12/20  Yes Kilroy, Luke K, PA-C  empagliflozin (JARDIANCE) 25 MG TABS tablet Take 25 mg by mouth daily. 12/01/19  Yes Lockamy, Timothy, DO  furosemide (LASIX) 40 MG tablet Take 1 tablet (40 mg total) by mouth daily. 05/03/20 05/03/21 Yes Caryl Ada K, PA-C  insulin glargine (LANTUS) 100 UNIT/ML Solostar Pen Inject 20 Units into the skin every morning. Patient taking differently: Inject 5 Units into the skin daily.  05/06/20  Yes Meccariello, Bernita Raisin, DO  metFORMIN (GLUCOPHAGE) 1000 MG tablet Take 1,000 mg by mouth 2 (two) times daily. 11/21/19  Yes [provider]  sacubitril-valsartan (ENTRESTO) 24-26 MG Take 1 tablet by mouth 2 (two) times daily. 03/12/20  Yes Kilroy, Doreene [place], PA-C  spironolactone (ALDACTONE) 25 MG tablet Take 0.5 tablets (12.5 mg total) by mouth daily. 03/12/20  Yes Kilroy, Luke K, PA-C  ARIPiprazole (ABILIFY) 5 MG tablet Take 1 tablet (5 mg total) by mouth daily. 05/22/20   Meccariello, Bernita Raisin, DO  Blood Glucose Monitoring Suppl (ONE TOUCH ULTRA 2) W/DEVICE KIT Check sugar once daily. ICD10 E11.65 01/24/15   Leone Brand, MD  Continuous Glucose Monitor KIT 1 kit by Does not apply route daily. 02/20/19   Caroline More, DO  ferrous sulfate 325 (65 FE) MG tablet Take 325 mg by mouth  daily with breakfast.  [provider]  glucose blood (ONE TOUCH ULTRA TEST) test strip Check sugar once daily in the morning. ICD10 E11.65 01/10/18   Tonette Bihari, MD  Multiple Vitamins-Minerals (MULTIVITAMIN WITH MINERALS) tablet Take 1 tablet by mouth daily.    [provider]  nitroGLYCERIN (NITROSTAT) 0.4 MG SL tablet Place 1 tablet (0.4 mg total) under the tongue every 5 (five) minutes as needed for chest pain. 10/07/19   Nuala Alpha, DO  ONE TOUCH LANCETS MISC Use to check sugar once daily. ICD10 E11.65 01/09/15   Willeen Niece, MD  traZODone (DESYREL) 50 MG tablet Take 50 mg by mouth at bedtime.    [provider]  triamcinolone cream (KENALOG) 0.1 % Apply 1 application topically 2 (two) times daily. 08/01/19   Sherene Sires, DO     Critical care time: 41 minutes     Noe Gens, MSN, NP-C, AGACNP-BC [facility] 07/11/2020, 9:53 AM   Please see [email or web address] for pager details.

## 2020-07-12 DIAGNOSIS — I5022 Chronic systolic (congestive) heart failure: Secondary | ICD-10-CM | POA: Diagnosis not present

## 2020-07-12 DIAGNOSIS — R001 Bradycardia, unspecified: Secondary | ICD-10-CM | POA: Diagnosis not present

## 2020-07-12 LAB — GLUCOSE, CAPILLARY
Glucose-Capillary: 220 mg/dL — ABNORMAL HIGH (ref 70–99)
Glucose-Capillary: 84 mg/dL (ref 70–99)
Glucose-Capillary: 234 mg/dL — ABNORMAL HIGH (ref 70–99)
Glucose-Capillary: 49 mg/dL — ABNORMAL LOW (ref 70–99)
Glucose-Capillary: 146 mg/dL — ABNORMAL HIGH (ref 70–99)

## 2020-07-12 LAB — TYPE AND SCREEN
ABO/RH(D): B POS
Antibody Screen: NEGATIVE
Unit division: 0
Unit division: 0

## 2020-07-12 LAB — CBC
HCT: 28.1 % — ABNORMAL LOW (ref 39.0–52.0)
Hemoglobin: 8.1 g/dL — ABNORMAL LOW (ref 13.0–17.0)
MCH: 20.5 pg — ABNORMAL LOW (ref 26.0–34.0)
MCHC: 28.8 g/dL — ABNORMAL LOW (ref 30.0–36.0)
MCV: 71.1 fL — ABNORMAL LOW (ref 80.0–100.0)
Platelets: 227 10*3/uL (ref 150–400)
RBC: 3.95 MIL/uL — ABNORMAL LOW (ref 4.22–5.81)
RDW: 26.4 % — ABNORMAL HIGH (ref 11.5–15.5)
WBC: 10 10*3/uL (ref 4.0–10.5)
nRBC: 2.1 % — ABNORMAL HIGH (ref 0.0–0.2)

## 2020-07-12 LAB — BASIC METABOLIC PANEL
Anion gap: 8 (ref 5–15)
BUN: 15 mg/dL (ref 6–20)
CO2: 23 mmol/L (ref 22–32)
Calcium: 8.2 mg/dL — ABNORMAL LOW (ref 8.9–10.3)
Chloride: 105 mmol/L (ref 98–111)
Creatinine, Ser: 1.11 mg/dL (ref 0.61–1.24)
GFR, Estimated: >60 mL/min (ref >60)
Glucose, Bld: 224 mg/dL — ABNORMAL HIGH (ref 70–99)
Potassium: 4.5 mmol/L (ref 3.5–5.1)
Sodium: 136 mmol/L (ref 135–145)

## 2020-07-12 LAB — BPAM RBC
Blood Product Expiration Date: 202112232359
Blood Product Expiration Date: 202112252359
ISSUE DATE / TIME: 202112020515
ISSUE DATE / TIME: 202112020631
Unit Type and Rh: 7300
Unit Type and Rh: 7300

## 2020-07-12 LAB — HEPARIN LEVEL (UNFRACTIONATED): Heparin Unfractionated: 0.24 IU/mL — ABNORMAL LOW (ref 0.30–0.70)

## 2020-07-12 MED ORDER — HEPARIN (PORCINE) 25000 UT/250ML-% IV SOLN
1450.0000 [IU]/h | INTRAVENOUS | Status: DC
  Administered 2020-07-12: 1200 [IU]/h via INTRAVENOUS
  Administered 2020-07-13: 1400 [IU]/h via INTRAVENOUS
  Administered 2020-07-15 – 2020-07-17 (×3): 1350 [IU]/h via INTRAVENOUS
  Administered 2020-07-18: 1450 [IU]/h via INTRAVENOUS
  Filled 2020-07-12 (×8): qty 250

## 2020-07-12 NOTE — Progress Notes (Signed)
PHARMACIST LIPID MONITORING   Paul Touchet. is a 49 y.o. male admitted on 07/09/2020 with bradycardia s/p fem pop bypass.  Pharmacy has been consulted to optimize lipid-lowering therapy with the indication of secondary prevention for clinical ASCVD.  Recent Labs:  Lipid Panel (last 6 months):   Lab Results  Component Value Date   CHOL 71 07/11/2020   TRIG 65 07/11/2020   HDL 13 (L) 07/11/2020   CHOLHDL 5.5 07/11/2020   VLDL 13 07/11/2020   LDLCALC 45 07/11/2020    Hepatic function panel (last 6 months):   Lab Results  Component Value Date   AST 27 07/11/2020   ALT 17 07/11/2020   ALKPHOS 55 07/11/2020   BILITOT 1.6 (H) 07/11/2020    SCr (since admission):   Serum creatinine: 1.11 mg/dL 29/02/11 1552 Estimated creatinine clearance: 85.7 mL/min  Current lipid-lowering therapy: atorvastatin 40 mg Previous lipid-lowering therapies (if applicable): unknown Documented or reported allergies or intolerances to lipid-lowering therapies (if applicable): none  Assessment:  Patient has not been compliant with current therapy  Recommendation per protocol:  Continue current lipid-lowering therapy.  Follow-up with:  Primary care provider - Meccariello, Solmon Ice, DO  Follow-up labs after discharge:    Liver function panel and lipid panel in 8-12 weeks then annually  Plan: Continue current therapy - LDL at goal  Elmer Sow, PharmD, BCPS, BCCCP Clinical Pharmacist [phone]  Please check AMION for all [facility] Pharmacy numbers  07/12/2020 12:45 PM

## 2020-07-12 NOTE — Progress Notes (Signed)
Foley cath removed prior to patients transfer to 4E19. Patient alert and oriented X4. PIV in left hand c/d/i infusing Heparin@1200  units/hr. Patient has cell phone, cell phone charger, wallet, eye glasses, and clothes in his possession and within reach. Four wheel walker at bedside. Report given to Darrel, RN.

## 2020-07-12 NOTE — Progress Notes (Signed)
ANTICOAGULATION CONSULT NOTE  Pharmacy Consult for Heparin Indication: DVT  Allergies  Allergen Reactions  . Shrimp [Shellfish Allergy] Swelling    tongue    Patient Measurements: Height: 5\' 11"  (180.3 cm) Weight: 86.3 kg (190 lb 4.1 oz) IBW/kg (Calculated) : 75.3  Vital Signs: Temp: 98 F (36.7 C) (12/03 2312) Temp Source: Oral (12/03 2312) BP: 107/77 (12/03 2312) Pulse Rate: 82 (12/03 2312)  Labs: Recent Labs    07/11/20 0133 07/11/20 0133 07/11/20 0849 07/11/20 0849 07/11/20 2228 07/12/20 0703 07/12/20 2230  HGB 6.7*   < > 9.0*   < > 8.4* 8.1*  --   HCT 23.0*   < > 29.7*  --  27.5* 28.1*  --   PLT 275  --   --   --  250 227  --   HEPARINUNFRC  --   --   --   --   --   --  0.24*  CREATININE 0.86  --  1.25*  --   --  1.11  --   TROPONINIHS  --   --  19*  --   --   --   --    < > = values in this interval not displayed.    Estimated Creatinine Clearance: 85.7 mL/min (by C-G formula based on SCr of 1.11 mg/dL).  Assessment: 49 y.o. male with h/o PAD and DVT s/p fem-pop bypass 12/1, for heparin  Goal of Therapy:  Heparin level 0.3-0.7 units/ml Monitor platelets by anticoagulation protocol: Yes   Plan:  Increase Heparin 1400 units/hr Follow-up am labs.   14/1, PharmD, BCPS  07/12/2020 11:58 PM

## 2020-07-12 NOTE — Anesthesia Postprocedure Evaluation (Signed)
Anesthesia Post Note  Patient: Paul [name].  Procedure(s) Performed: RIGHT ABOVE KNEE POPLITEAL TO POSTERIAL TIBIAL ARTERY BYPASS GRAFT (Right Leg Lower) LEFT LOWER EXTREMITY ANGIOGRAM WITH INTERVENTION (Left ) BALLOON ANGIOPLASTY OF LEFT POPLITEAL USING RANGER 4.78mm X 7mm OVER-THE-WIRE PTA BALLOON (Left )     Patient location during evaluation: PACU Anesthesia Type: General Level of consciousness: awake and alert Pain management: pain level controlled Vital Signs Assessment: post-procedure vital signs reviewed and stable Respiratory status: spontaneous breathing, nonlabored ventilation, respiratory function stable and patient connected to nasal cannula oxygen Cardiovascular status: blood pressure returned to baseline and stable Postop Assessment: no apparent nausea or vomiting Anesthetic complications: no   No complications documented.  Last Vitals:  Vitals:   07/12/20 0720 07/12/20 0800  BP:  120/80  Pulse:  77  Resp:  15  Temp: 36.9 C   SpO2:  100%    Last Pain:  Vitals:   07/12/20 0800  TempSrc:   PainSc: 0-No pain                 Cecile Hearing

## 2020-07-12 NOTE — TOC Initial Note (Signed)
Transition of Care (TOC) - Initial/Assessment Note    Patient Details  Name: Paul [name]. MRN: [id number] Date of Birth: 06/06/1971  Transition of Care [facility]) CM/SW Contact:    Bess Kinds, RN Phone Number: [phone] 07/12/2020, 1:10 PM  Clinical Narrative:                  Spoke with patient at the bedside to discuss transition plans. Patient has been living out of his car. Stated that he has a storage unit to keep some of his stuff. He has made arrangements to stay with a friend after discharge. Stated that his car is in valet parking at the hospital. He has a rollator at bedside.   Discussed previous HH with [facility]. Stated that he could never schedule a time that would work. Verified with [facility] that patient is not active. Patient stated that he would prefer to go to outpatient PT - discussed [facility] location and he was agreeable.   Discussed housing resources and will place information on AVS.   TOC following for transition needs.   Expected Discharge Plan: OP Rehab Barriers to Discharge: Continued Medical Work up   Patient Goals and CMS Choice Patient states their goals for this hospitalization and ongoing recovery are:: home with a friend CMS Medicare.gov Compare Post Acute Care list provided to:: Patient Choice offered to / list presented to : Patient  Expected Discharge Plan and Services Expected Discharge Plan: OP Rehab In-house Referral: [facility] Discharge Planning Services: CM Consult   Living arrangements for the past 2 months: Homeless                 DME Arranged: N/A DME Agency: NA       HH Arranged: NA HH Agency: NA        Prior Living Arrangements/Services Living arrangements for the past 2 months: Homeless Lives with:: Self Patient language and need for interpreter reviewed:: Yes            Current home services: DME (rollator; 3N1) Criminal Activity/Legal Involvement Pertinent to Current Situation/Hospitalization: No - Comment as  needed  Activities of Daily Living Home Assistive Devices/Equipment: Eyeglasses, Wheelchair ADL Screening (condition at time of admission) Patient's cognitive ability adequate to safely complete daily activities?: Yes Is the patient deaf or have difficulty hearing?: No Does the patient have difficulty seeing, even when wearing glasses/contacts?: No Does the patient have difficulty concentrating, remembering, or making decisions?: No Patient able to express need for assistance with ADLs?: Yes Does the patient have difficulty dressing or bathing?: No Independently performs ADLs?: Yes (appropriate for developmental age) Does the patient have difficulty walking or climbing stairs?: Yes Weakness of Legs: Both Weakness of Arms/Hands: None  Permission Sought/Granted                  Emotional Assessment Appearance:: Appears stated age Attitude/Demeanor/Rapport: Engaged Affect (typically observed): Accepting Orientation: : Oriented to Self, Oriented to  Time, Oriented to Place, Oriented to Situation Alcohol / Substance Use: Not Applicable Psych Involvement: No (comment)  Admission diagnosis:  PAD (peripheral artery disease) (HCC) [I73.9] Critical lower limb ischemia (HCC) [I70.229] Patient Active Problem List   Diagnosis Date Noted  . Junctional bradycardia   . Ischemic rest pain of lower extremity 06/27/2020  . PAD (peripheral artery disease) (HCC) 06/27/2020  . Homelessness 05/22/2020  . Anticoagulated 03/12/2020  . Chronic anemia 03/12/2020  . Systolic congestive heart failure (HCC)   . Renal mass   . Critical lower  limb ischemia (HCC) 02/17/2020  . Neck mass 07/14/2019  . Hypotension 02/20/2019  . PTSD (post-traumatic stress disorder) 12/01/2018  . Cardiomyopathy (HCC) 01/14/2018  . Diabetic neuropathy (HCC) 01/10/2018  . Hypertension   . CAD (coronary artery disease)   . Hyperlipidemia LDL goal <100   . Stented coronary artery 04/30/2011  . Atopic dermatitis  08/19/2010  . Anxiety state 09/27/2009  . Diabetes mellitus type 2, uncontrolled (HCC) 09/19/2009   PCP:  Unknown Jim, DO Pharmacy:   [facility] [address] ([place]), [place] - [address] [place] ([place]) [place] [address] Phone: [phone] Fax: [phone]     Social Determinants of Health (SDOH) Interventions    Readmission Risk Interventions No flowsheet data found.

## 2020-07-12 NOTE — Consult Note (Signed)
Brief Sign Off Note  POD 2  planned right lower extremity above the knee popliteal to posterior tibial artery bypass graft, balloon angioplasty of the left posterior tibial artery. Course c/b bradycardia, hypotension. Found ot be anemic. Combo of medications likely not taking at home as well as hypovolemia/anemia. Stable off pressors. Ok to down grade out if ICU from my perspective.  PCCM will sign off.  No charge.  Karren Burly, MD

## 2020-07-12 NOTE — Progress Notes (Addendum)
ANTICOAGULATION CONSULT NOTE - Initial Consult  Pharmacy Consult for heparin Indication: DVT  Allergies  Allergen Reactions  . Shrimp [Shellfish Allergy] Swelling    tongue    Patient Measurements: Height: 5\' 11"  (180.3 cm) Weight: 80.4 kg (177 lb 4 oz) IBW/kg (Calculated) : 75.3  Vital Signs: Temp: 98 F (36.7 C) (12/03 1110) Temp Source: Axillary (12/03 1110) BP: 108/59 (12/03 1200) Pulse Rate: 85 (12/03 1201)  Labs: Recent Labs    07/11/20 0133 07/11/20 0133 07/11/20 0849 07/11/20 0849 07/11/20 2228 07/12/20 0703  HGB 6.7*   < > 9.0*   < > 8.4* 8.1*  HCT 23.0*   < > 29.7*  --  27.5* 28.1*  PLT 275  --   --   --  250 227  CREATININE 0.86  --  1.25*  --   --  1.11  TROPONINIHS  --   --  19*  --   --   --    < > = values in this interval not displayed.    Estimated Creatinine Clearance: 85.7 mL/min (by C-G formula based on SCr of 1.11 mg/dL).  Assessment: 49 yo m w hx of dvt PAD on apixaban pta - last dose 11/27  Now s/p fem pop bypass  tx to ICU for hypotension now resolved  Goal of Therapy:  Heparin level 0.3-0.7 units/ml Monitor platelets by anticoagulation protocol: Yes   Plan:  Dc sq heparin Heparin 1200 units/hr - no bolus per MD 2000 hep lvl Daily hep lvl cbc  12/27, PharmD, BCPS, BCCCP Clinical Pharmacist [phone]  Please check AMION for all [facility] Pharmacy numbers  07/12/2020 1:17 PM

## 2020-07-12 NOTE — Evaluation (Signed)
Physical Therapy Evaluation Patient Details Name: Paul [name]. MRN: [id number] DOB: 1970/11/03 Today's Date: 07/12/2020   History of Present Illness  49 yo admitted 11/30 with right popliteal occlusion. Pt s/p bil LE thrombectomy several months ago. 12/1 Rt fem pop BPG and Left PTA with post op bradycardia and hypotension with transfer to ICU. PMhx: PTSD, MI, HTN, DM, bipolar, homelessness, HLD, CAD, PAD  Clinical Impression  Pt shows decreased LE strength, decreased ROM in the R hip and ankle secondary to procedure, mod assist +2 for sit to stand, and decreased endurance with gait. Pt heavily relied on rollator during ambulation and needed several rest breaks, which is not normal to him since he is a homeless community ambulator and has to access his car, shelter, and restaurants. Pt would benefit from skilled acute PT in order to increase independence with transfers and increase gait to at least household distances in order to be able live with his friend or in his car safely. Pt would benefit from d/c to a SNF due to increased need for assistance with transfers, decreased LE strength, and decreased ability to ambulate household or community distances.     Follow Up Recommendations SNF;Supervision/Assistance - 24 hour    Equipment Recommendations  3in1 (PT)    Recommendations for Other Services       Precautions / Restrictions Precautions Precautions: Fall      Mobility  Bed Mobility Overal bed mobility: Needs Assistance Bed Mobility: Supine to Sit     Supine to sit: Min guard         Transfers Overall transfer level: Needs assistance Equipment used: 4-wheeled walker Transfers: Sit to/from Stand           General transfer comment: mod assist +2 for power up and safe hand placement with failed first attempt because of decreased power up  Ambulation/Gait Ambulation/Gait assistance: Min guard Gait Distance (Feet): 80 Feet Assistive device: 4-wheeled  walker Gait Pattern/deviations: Step-to pattern;Decreased step length - left;Decreased stance time - right;Decreased dorsiflexion - right   Gait velocity interpretation: <1.31 ft/sec, indicative of household ambulator General Gait Details: pt leaning on rollator and needed 3+ rest breaks secondary to fatigue and needed motivation from PTs to continue  Stairs            Wheelchair Mobility    Modified Rankin (Stroke Patients Only)       Balance Overall balance assessment: Needs assistance Sitting-balance support: No upper extremity supported;Feet supported Sitting balance-Leahy Scale: Good     Standing balance support: Bilateral upper extremity supported Standing balance-Leahy Scale: Poor Standing balance comment: reliant on external support and assist with initial posterior bias                             Pertinent Vitals/Pain Pain Assessment: Faces Faces Pain Scale: Hurts a little bit Pain Location: RLE with movement Pain Intervention(s): Monitored during session;Limited activity within patient's tolerance;Repositioned    Home Living Family/patient expects to be discharged to:: Private residence Living Arrangements: Other (Comment) Available Help at Discharge: Friend(s);Available PRN/intermittently Type of Home: House Home Access: Level entry     Home Layout: One level Home Equipment: Walker - 4 wheels Additional Comments: Lives in his [employer]. Showers at a shelter; eats out; uses his rollator. Going to stay at a friends house at DC    Prior Function Level of Independence: Independent with assistive device(s)  Hand Dominance        Extremity/Trunk Assessment   Upper Extremity Assessment Upper Extremity Assessment: Defer to OT evaluation    Lower Extremity Assessment Lower Extremity Assessment: RLE deficits/detail RLE Deficits / Details: weakness limited by pain grossly 3/5 knee extension/flexion, decreased ankle  ROM, hip flexion 3/5       Communication   Communication: No difficulties  Cognition Arousal/Alertness: Awake/alert Behavior During Therapy: WFL for tasks assessed/performed Overall Cognitive Status: No family/caregiver present to determine baseline cognitive functioning                                 General Comments: pt self limiting and needs encouragement to increase function      General Comments      Exercises General Exercises - Lower Extremity Long Arc Quad: AROM;Seated;15 reps;Both Hip Flexion/Marching: AROM;15 reps;Seated;Both   Assessment/Plan    PT Assessment Patient needs continued PT services  PT Problem List Decreased strength;Decreased activity tolerance;Decreased balance;Decreased mobility;Pain;Decreased knowledge of use of DME       PT Treatment Interventions Gait training;DME instruction;Balance training;Functional mobility training;Patient/family education;Therapeutic activities;Therapeutic exercise;Cognitive remediation    PT Goals (Current goals can be found in the Care Plan section)  Acute Rehab PT Goals Patient Stated Goal: be able to move PT Goal Formulation: With patient Time For Goal Achievement: 07/26/20 Potential to Achieve Goals: Fair    Frequency Min 3X/week   Barriers to discharge Decreased caregiver support      Co-evaluation               AM-PAC PT "6 Clicks" Mobility  Outcome Measure Help needed turning from your back to your side while in a flat bed without using bedrails?: A Little Help needed moving from lying on your back to sitting on the side of a flat bed without using bedrails?: A Little Help needed moving to and from a bed to a chair (including a wheelchair)?: A Lot Help needed standing up from a chair using your arms (e.g., wheelchair or bedside chair)?: A Lot Help needed to walk in hospital room?: A Little Help needed climbing 3-5 steps with a railing? : A Lot 6 Click Score: 15    End of  Session Equipment Utilized During Treatment: Gait belt Activity Tolerance: Patient tolerated treatment well Patient left: in chair;with call bell/phone within reach;with chair alarm set Nurse Communication: Mobility status PT Visit Diagnosis: Unsteadiness on feet (R26.81);Other abnormalities of gait and mobility (R26.89);Pain    Time: 1152-1227 PT Time Calculation (min) (ACUTE ONLY): 35 min   Charges:   PT Evaluation $PT Eval Moderate Complexity: 1 Mod PT Treatments $Gait Training: 8-22 mins        Jeri Cos, SPT [id number]  Kristain Hu 07/12/2020, 1:17 PM

## 2020-07-12 NOTE — Progress Notes (Signed)
Occupational Therapy Evaluation Patient Details Name: Paul [name]. MRN: [id number] DOB: November 28, 1970 Today's Date: 07/12/2020    History of Present Illness 49 yo admitted 11/30 with right popliteal occlusion. Pt s/p bil LE thrombectomy several months ago. 12/1 Rt fem pop BPG and Left PTA with post op bradycardia and hypotension with transfer to ICU. PMhx: PTSD, MI, HTN, DM, bipolar, homelessness, HLD, CAD, PAD   Clinical Impression   PTA pt states he was living in his [employer] and was independent with ADL and mobility using a rollator for community ambulation. He used a shelter facility for showering and ate all meals out/in his car. Pt plans to go back to his car after DC. Pt is unclear if he has options for a place to stay and mentions a friend's house but states "it is cold there and he doesn't like to stay there". Session limited today by bleeding from lower leg wound once standing. Pt returned to supine, nsg notified and pressure applied to leg/bandage reinforced. VSS during session. BP supine 120/80; sitting 109/80 - no complaints of dizziness. Difficulty achieving accurateSpO2 reading due to poor pleth however pt did not demonstrate any SOB with mobility. Required mod A to stand and requires Mod A for LB ADL tasks. Recommend SW/CM involvement to help with safe DC plan. Hope for pt to be able to DC to "friend's house" with HHOT follow up. Will follow acutely to maximize functional level of independence and safe DC home.     Follow Up Recommendations  Supervision - Intermittent (pending progress; will further assess) ; HHOT   Equipment Recommendations  Other (comment) (will further assess pending DC disposition)    Recommendations for Other Services PT consult     Precautions / Restrictions Precautions Precautions: Fall Restrictions Weight Bearing Restrictions: Yes RLE Weight Bearing: Weight bearing as tolerated      Mobility Bed Mobility Overal bed mobility: Needs  Assistance Bed Mobility: Supine to Sit;Sit to Supine     Supine to sit: Min assist Sit to supine: Mod assist   General bed mobility comments: A to lift BLE back onto bed    Transfers Overall transfer level: Needs assistance Equipment used: 4-wheeled walker Transfers: Sit to/from Stand Sit to Stand: Mod assist              Balance Overall balance assessment: Needs assistance   Sitting balance-Leahy Scale: Good       Standing balance-Leahy Scale: Poor Standing balance comment: reliant on external support                           ADL either performed or assessed with clinical judgement   ADL Overall ADL's : Needs assistance/impaired     Grooming: Set up;Sitting   Upper Body Bathing: Set up;Sitting   Lower Body Bathing: Moderate assistance;Sit to/from stand   Upper Body Dressing : Set up;Sitting   Lower Body Dressing: Moderate assistance;Sit to/from stand       Toileting- Architect and Hygiene: Maximal assistance Toileting - Clothing Manipulation Details (indicate cue type and reason): foley     Functional mobility during ADLs: Moderate assistance (rollator) General ADL Comments: Unable to reach feet; unable to complete figure four positioing     Vision Baseline Vision/History: Wears glasses Wears Glasses: At all times Patient Visual Report: No change from baseline       Perception     Praxis      Pertinent Vitals/Pain Pain Assessment: 0-10  Pain Score: 10-Worst pain ever Pain Location: RLE Pain Descriptors / Indicators: Discomfort;Grimacing;Burning Pain Intervention(s): Limited activity within patient's tolerance     Hand Dominance Right   Extremity/Trunk Assessment Upper Extremity Assessment Upper Extremity Assessment: Overall WFL for tasks assessed   Lower Extremity Assessment Lower Extremity Assessment: Defer to PT evaluation   Cervical / Trunk Assessment Cervical / Trunk Assessment: Kyphotic   Communication  Communication Communication: No difficulties   Cognition Arousal/Alertness: Awake/alert Behavior During Therapy: WFL for tasks assessed/performed Overall Cognitive Status: No family/caregiver present to determine baseline cognitive functioning                                 General Comments: Most likely at baseline; decreased aareness; decreased insight into needing assistance at DC   General Comments  upon standing, noted increased amount of blood on floor - apparently coming from superior aspect of lower leg wound - pr returned to bed    Exercises     Shoulder Instructions      Home Living Family/patient expects to be discharged to:: Unsure Living Arrangements: Other (Comment)                               Additional Comments: Lives in his [employer]. Showers at a shelter; eats out; uses his rollator      Prior Functioning/Environment Level of Independence: Independent with assistive device(s)        Comments: uses his rollator        OT Problem List: Decreased strength;Decreased activity tolerance;Impaired balance (sitting and/or standing);Decreased safety awareness;Decreased knowledge of use of DME or AE;Cardiopulmonary status limiting activity;Impaired sensation;Pain;Increased edema      OT Treatment/Interventions: Self-care/ADL training;Therapeutic exercise;Energy conservation;DME and/or AE instruction;Therapeutic activities;Patient/family education;Balance training    OT Goals(Current goals can be found in the care plan section) Acute Rehab OT Goals Patient Stated Goal: to go back to his car OT Goal Formulation: With patient Time For Goal Achievement: 07/26/20 Potential to Achieve Goals: Good  OT Frequency: Min 2X/week   Barriers to D/C: Decreased caregiver support  homelessness       Co-evaluation              AM-PAC OT "6 Clicks" Daily Activity     Outcome Measure Help from another person eating meals?: None Help  from another person taking care of personal grooming?: A Little Help from another person toileting, which includes using toliet, bedpan, or urinal?: A Lot Help from another person bathing (including washing, rinsing, drying)?: A Lot Help from another person to put on and taking off regular upper body clothing?: A Little Help from another person to put on and taking off regular lower body clothing?: A Lot 6 Click Score: 16   End of Session Equipment Utilized During Treatment: Gait belt;Oxygen (4L; rollator) Nurse Communication: Mobility status;Other (comment) (bleeding from wound)  Activity Tolerance: Treatment limited secondary to medical complications (Comment) (bleeding from leg wound) Patient left: in bed;with call bell/phone within reach;with bed alarm set  OT Visit Diagnosis: Other abnormalities of gait and mobility (R26.89);Muscle weakness (generalized) (M62.81);Pain Pain - Right/Left: Right Pain - part of body: Leg                Time: 5701-7793 OT Time Calculation (min): 44 min Charges:  OT General Charges $OT Visit: 1 Visit OT Evaluation $OT Eval Moderate Complexity: 1 Mod OT  Treatments $Self Care/Home Management : 23-37 mins  Luisa Dago, OT/L   Acute OT Clinical Specialist Acute Rehabilitation Services Pager [phone] Office [phone]   [facility] 07/12/2020, 9:52 AM

## 2020-07-12 NOTE — Consult Note (Signed)
   [facility] CM Inpatient Consult   07/12/2020  Nakai Pollio 10/23/1970 [id number]  [facility] [[facility]]  Accountable Care Organization [ACO] Patient: Medicare  Referral request from inpatient [facility] RNCM regarding patient post hospital needs for housing.  Review of patient currently in ICU.  Patient will have the transition of care call conducted by the primary care provider. This patient is also in an Embedded practice which has a chronic disease management Embedded Care Management team and can make a referral to the Embedded team regarding issues for homelessness.  Plan: Notification sent to the [facility] Embedded Care Management and made aware of above needs.   Please contact for further questions,  Charlesetta Shanks, RN BSN CCM [facility]  [phone] business mobile phone Toll free office [phone]  Fax number: [phone] [email or web address]

## 2020-07-12 NOTE — Progress Notes (Addendum)
Progress Note    07/12/2020 8:32 AM 2 Days Post-Op  Subjective: no major complaints. Eating breakfast when I entered the room. Says he no longer has pain in his right leg   Vitals:   07/12/20 0720 07/12/20 0800  BP:  120/80  Pulse:  77  Resp:  15  Temp: 98.5 F (36.9 C)   SpO2:  100%   Physical Exam: Cardiac: regular rate and rhythm Lungs: non labored Incisions:  Right thigh incisions are intact. There is serosanguinous drainage from medial thigh  incision Extremities:  Lower extremities well perfused and warm. Brisk Doppler PT bilaterally. Monophasic right AT. Motor and sensation intact Abdomen: soft, non tender, non distended Neurologic: alert and oriented  CBC    Component Value Date/Time   WBC 10.0 07/12/2020 0703   RBC 3.95 (L) 07/12/2020 0703   HGB 8.1 (L) 07/12/2020 0703   HGB 9.1 (L) 06/28/2020 0946   HCT 28.1 (L) 07/12/2020 0703   HCT 33.4 (L) 06/28/2020 0946   PLT 227 07/12/2020 0703   PLT 335 06/28/2020 0946   MCV 71.1 (L) 07/12/2020 0703   MCV 68 (L) 06/28/2020 0946   MCH 20.5 (L) 07/12/2020 0703   MCHC 28.8 (L) 07/12/2020 0703   RDW 26.4 (H) 07/12/2020 0703   RDW 21.1 (H) 06/28/2020 0946   LYMPHSABS 1.3 06/28/2020 0946   MONOABS 0.6 02/16/2020 2044   EOSABS 0.0 06/28/2020 0946   BASOSABS 0.0 06/28/2020 0946    BMET    Component Value Date/Time   NA 136 07/12/2020 0703   NA 136 06/28/2020 0946   K 4.5 07/12/2020 0703   CL 105 07/12/2020 0703   CO2 23 07/12/2020 0703   GLUCOSE 224 (H) 07/12/2020 0703   BUN 15 07/12/2020 0703   BUN 9 06/28/2020 0946   CREATININE 1.11 07/12/2020 0703   CALCIUM 8.2 (L) 07/12/2020 0703   GFRNONAA >60 07/12/2020 0703   GFRAA 110 06/28/2020 0946    INR    Component Value Date/Time   INR 1.1 (H) 08/24/2011 1002     Intake/Output Summary (Last 24 hours) at 07/12/2020 1914 Last data filed at 07/12/2020 0800 Gross per 24 hour  Intake 1850.12 ml  Output 1225 ml  Net 625.12 ml     Assessment/Plan:  49  y.o. male is s/p   #1: Right above-knee popliteal to posterior tibial artery bypass graft with    nonreversed ipsilateral translocated saphenous vein #2: Ultrasound-guided access, right femoral artery #3: Drug-coated balloon angioplasty, left posterior tibial artery 2 Days Post-Op. Lower extremities well perfused with brisk doppler PT bilaterally. Blood pressure remains soft. Some improvement following transfusion. Hemoglobin 8.1 this morning. Which is close to his baseline with chronic anemia. Will need to continue to monitor. Bradycardia resolved. In NSR. Appreciate CCM and Cardiology assistance. They recommend continuing to hold nodal agents.  On Asa, Plavix and Eliquis due to recent intervention and history of apical thrombus. Tolerating diet. Good UOP and LBM 12/1. Okay for Oob to chair and mobilization with PT/ OT today as tolerated  DVT prophylaxis: sq Heparin   Graceann Congress, PA-C [facility] [phone] 07/12/2020 8:32 AM.   I agree with the above.  He is postoperative day 2 from right popliteal posterior tibial bypass and left popliteal angioplasty.  He has brisk posterior tibial Doppler signals.  He looks better today than yesterday.  I appreciate cardiology and critical care assistance.  He was able to ambulate a little bit today.  He states that his  breast pain has subsided.  I am going to restart him on heparin given his hypercoagulable issues.  He will not get a bolus.  Ultimately he will go back on triple therapy at the time of discharge.  Durene Cal

## 2020-07-12 NOTE — TOC Progression Note (Signed)
Transition of Care (TOC) - Progression Note    Patient Details  Name: Paul [name]. MRN: [id number] Date of Birth: 09-26-1970  Transition of Care [facility]) CM/SW Contact  Bess Kinds, RN Phone Number: [phone] 07/12/2020, 2:31 PM  Clinical Narrative:     Returned to patient bedside to discuss resources. Patient is already aware of resources and stated that he has not been able to get help d/t being a single person. He says he has somewhere to stay for a week.   Discussed having a wife listed as his contact person. He stated that they have been separated for 7 years, but she is still his contact person but not a housing option.   Referral to [facility] - patient has community case management through his PCP office.   TOC following for transition needs.   Expected Discharge Plan: OP Rehab Barriers to Discharge: Continued Medical Work up  Expected Discharge Plan and Services Expected Discharge Plan: OP Rehab In-house Referral: [facility] Discharge Planning Services: CM Consult   Living arrangements for the past 2 months: Homeless                 DME Arranged: N/A DME Agency: NA       HH Arranged: NA HH Agency: NA         Social Determinants of Health (SDOH) Interventions    Readmission Risk Interventions No flowsheet data found.

## 2020-07-12 NOTE — Progress Notes (Signed)
Progress Note  Patient Name: Paul CapriRoosevelt Fiorella Jr. Date of Encounter: 07/12/2020  [facility] Cardiologist: Rollene RotundaJames Hochrein, MD   Subjective   Patient feels well today. No chest pain or SOB. Had some drop in blood pressure when standing to work with OT. Not symptomatic.  Inpatient Medications    Scheduled Meds: . ARIPiprazole  5 mg Oral Daily  . aspirin EC  81 mg Oral Daily  . atorvastatin  40 mg Oral QHS  . Chlorhexidine Gluconate Cloth  6 each Topical Daily  . citalopram  20 mg Oral BID  . docusate sodium  100 mg Oral Daily  . ferrous sulfate  325 mg Oral Q breakfast  . heparin  5,000 Units Subcutaneous Q8H  . insulin aspart  0-9 Units Subcutaneous TID WC  . multivitamin with minerals  1 tablet Oral Daily  . pantoprazole  40 mg Oral Daily  . traZODone  50 mg Oral QHS   Continuous Infusions: . sodium chloride 50 mL/hr at 07/12/20 0600  . magnesium sulfate bolus IVPB     PRN Meds: acetaminophen **OR** acetaminophen, alum & mag hydroxide-simeth, bisacodyl, guaiFENesin-dextromethorphan, hydrALAZINE, magnesium sulfate bolus IVPB, metoprolol tartrate, nitroGLYCERIN, ondansetron, oxyCODONE-acetaminophen, phenol, potassium chloride, senna-docusate, sodium phosphate   Vital Signs    Vitals:   07/12/20 0600 07/12/20 0700 07/12/20 0720 07/12/20 0800  BP: 107/73 107/80  120/80  Pulse: 72 73  77  Resp: (!) 9 14  15   Temp:   98.5 F (36.9 C)   TempSrc:   Oral   SpO2: 100% 100%  100%  Weight:      Height:        Intake/Output Summary (Last 24 hours) at 07/12/2020 0812 Last data filed at 07/12/2020 0800 Gross per 24 hour  Intake 1850.12 ml  Output 1225 ml  Net 625.12 ml   Last 3 Weights 07/11/2020 07/10/2020 07/09/2020  Weight (lbs) 177 lb 4 oz 165 lb 165 lb  Weight (kg) 80.4 kg 74.844 kg 74.844 kg      Telemetry    NSR - Personally Reviewed  ECG    NSR with q waves in V1-V3 and inferiorly - Personally Reviewed  Physical Exam   GEN: No acute distress.  Laying  comfortably Neck: No JVD Cardiac: RRR, no murmurs, rubs, or gallops.  Respiratory: Clear to auscultation bilaterally. GI: Soft, nontender, non-distended  MS: Right leg incision site with bandages in place and c/d/i. Left leg warm. Trace edema Neuro:  Nonfocal  Psych: Normal affect   Labs    High Sensitivity Troponin:   Recent Labs  Lab 07/11/20 0849  TROPONINIHS 19*      Chemistry Recent Labs  Lab 07/10/20 1502 07/10/20 1502 07/10/20 1508 07/11/20 0133 07/11/20 0849  NA 138   < > 139 136 133*  K 4.4   < > 4.4 4.6 5.2*  CL 104  --   --  104 105  CO2  --   --   --  22 16*  GLUCOSE 198*  --   --  230* 267*  BUN 9  --   --  12 16  CREATININE 0.80  --   --  0.86 1.25*  CALCIUM  --   --   --  8.4* 8.1*  PROT  --   --   --   --  6.6  ALBUMIN  --   --   --   --  2.4*  AST  --   --   --   --  27  ALT  --   --   --   --  17  ALKPHOS  --   --   --   --  55  BILITOT  --   --   --   --  1.6*  GFRNONAA  --   --   --  >60 >60  ANIONGAP  --   --   --  10 12   < > = values in this interval not displayed.     Hematology Recent Labs  Lab 07/11/20 0133 07/11/20 0133 07/11/20 0849 07/11/20 2228 07/12/20 0703  WBC 10.3  --   --  10.9* 10.0  RBC 3.54*  --   --  3.97* 3.95*  HGB 6.7*   < > 9.0* 8.4* 8.1*  HCT 23.0*   < > 29.7* 27.5* 28.1*  MCV 65.0*  --   --  69.3* 71.1*  MCH 18.9*  --   --  21.2* 20.5*  MCHC 29.1*  --   --  30.5 28.8*  RDW 24.3*  --   --  25.7* 26.4*  PLT 275  --   --  250 227   < > = values in this interval not displayed.    BNPNo results for input(s): BNP, PROBNP in the last 168 hours.   DDimer No results for input(s): DDIMER in the last 168 hours.   Radiology    ECHOCARDIOGRAM COMPLETE  Result Date: 07/11/2020    ECHOCARDIOGRAM REPORT   Patient Name:   Paul [name]. Date of Exam: 07/11/2020 Medical Rec #:  [id number]             Height:       71.0 in Accession #:    [id number]            Weight:       177.2 lb Date of Birth:  16-Aug-1970             BSA:          2.003 m Patient Age:    48 years              BP:           161/101 mmHg Patient Gender: M                     HR:           72 bpm. Exam Location:  Inpatient Procedure: 2D Echo, Color Doppler, Cardiac Doppler and Intracardiac            Opacification Agent Indications:    Abnormal ECG 794.31 / R94.31  History:        Patient has prior history of Echocardiogram examinations, most                 recent 02/21/2020. PAD. Cardiomyopathy, CAD, History of Covid                 Signs/Symptoms:Dyspnea; Risk Factors:Hypertension,                 Diabetes and Dyslipidemia.  Sonographer:    Leta Jungling RDCS Referring Phys: [id number] Luetta Nutting OLLIS IMPRESSIONS  1. Distal septal and apical akinesis with overall moderate-severe LV dysfunction; apical thrombus noted using definity.  2. Left ventricular ejection fraction, by estimation, is 30 to 35%. The left ventricle has moderate to severely decreased function. The left ventricle demonstrates regional wall motion abnormalities (see scoring diagram/findings for description). The left ventricular  internal cavity size was mildly dilated. Left ventricular diastolic parameters are consistent with Grade I diastolic dysfunction (impaired relaxation).  3. Right ventricular systolic function is moderately reduced. The right ventricular size is mildly enlarged. There is moderately elevated pulmonary artery systolic pressure.  4. Right atrial size was mildly dilated.  5. A small pericardial effusion is present.  6. The mitral valve is normal in structure. Mild mitral valve regurgitation. No evidence of mitral stenosis.  7. Tricuspid valve regurgitation is severe.  8. The aortic valve is tricuspid. Aortic valve regurgitation is not visualized. Mild aortic valve sclerosis is present, with no evidence of aortic valve stenosis.  9. The inferior vena cava is dilated in size with >50% respiratory variability, suggesting right atrial pressure of 8 mmHg. FINDINGS  Left  Ventricle: Left ventricular ejection fraction, by estimation, is 30 to 35%. The left ventricle has moderate to severely decreased function. The left ventricle demonstrates regional wall motion abnormalities. Definity contrast agent was given IV to delineate the left ventricular endocardial borders. The left ventricular internal cavity size was mildly dilated. There is no left ventricular hypertrophy. Left ventricular diastolic parameters are consistent with Grade I diastolic dysfunction (impaired relaxation). Right Ventricle: The right ventricular size is mildly enlarged.Right ventricular systolic function is moderately reduced. There is moderately elevated pulmonary artery systolic pressure. The tricuspid regurgitant velocity is 3.37 m/s, and with an assumed  right atrial pressure of 8 mmHg, the estimated right ventricular systolic pressure is 53.4 mmHg. Left Atrium: Left atrial size was normal in size. Right Atrium: Right atrial size was mildly dilated. Pericardium: A small pericardial effusion is present. Mitral Valve: The mitral valve is normal in structure. Mild mitral valve regurgitation. No evidence of mitral valve stenosis. Tricuspid Valve: The tricuspid valve is normal in structure. Tricuspid valve regurgitation is severe. No evidence of tricuspid stenosis. Aortic Valve: The aortic valve is tricuspid. Aortic valve regurgitation is not visualized. Mild aortic valve sclerosis is present, with no evidence of aortic valve stenosis. Pulmonic Valve: The pulmonic valve was normal in structure. Pulmonic valve regurgitation is trivial. No evidence of pulmonic stenosis. Aorta: The aortic root is normal in size and structure. Venous: The inferior vena cava is dilated in size with greater than 50% respiratory variability, suggesting right atrial pressure of 8 mmHg. IAS/Shunts: There is right bowing of the interatrial septum, suggestive of elevated left atrial pressure. No atrial level shunt detected by color flow  Doppler. Additional Comments: Distal septal and apical akinesis with overall moderate-severe LV dysfunction; apical thrombus noted using definity.  LEFT VENTRICLE PLAX 2D LVIDd:         5.60 cm      Diastology LVIDs:         4.40 cm      LV e' medial:    5.08 cm/s LV PW:         0.90 cm      LV E/e' medial:  11.3 LV IVS:        1.00 cm      LV e' lateral:   7.97 cm/s LVOT diam:     2.20 cm      LV E/e' lateral: 7.2 LV SV:         43 LV SV Index:   21 LVOT Area:     3.80 cm                              3D Volume EF:  LV Volumes (MOD)            3D EF:        46 % LV vol d, MOD A2C: 154.0 ml LV EDV:       188 ml LV vol d, MOD A4C: 164.0 ml LV ESV:       103 ml LV vol s, MOD A2C: 91.5 ml  LV SV:        86 ml LV vol s, MOD A4C: 94.7 ml LV SV MOD A2C:     62.5 ml LV SV MOD A4C:     164.0 ml LV SV MOD BP:      65.2 ml RIGHT VENTRICLE RV S prime:     5.70 cm/s TAPSE (M-mode): 0.8 cm LEFT ATRIUM             Index       RIGHT ATRIUM           Index LA diam:        3.50 cm 1.75 cm/m  RA Area:     21.20 cm LA Vol (A2C):   30.7 ml 15.33 ml/m RA Volume:   62.30 ml  31.10 ml/m LA Vol (A4C):   40.4 ml 20.17 ml/m LA Biplane Vol: 36.7 ml 18.32 ml/m  AORTIC VALVE LVOT Vmax:   70.00 cm/s LVOT Vmean:  42.900 cm/s LVOT VTI:    0.113 m  AORTA Ao Root diam: 3.20 cm Ao Asc diam:  3.60 cm MITRAL VALVE               TRICUSPID VALVE MV Area (PHT): 5.94 cm    TR Peak grad:   45.4 mmHg MV Decel Time: 128 msec    TR Vmax:        337.00 cm/s MV E velocity: 57.25 cm/s MV A velocity: 49.60 cm/s  SHUNTS MV E/A ratio:  1.15        Systemic VTI:  0.11 m                            Systemic Diam: 2.20 cm Olga Millers MD Electronically signed by Olga Millers MD Signature Date/Time: 07/11/2020/1:30:29 PM    Final    HYBRID OR IMAGING ([facility] ONLY)  Result Date: 07/10/2020 There is no interpretation for this exam.  This order is for images obtained during a surgical procedure.  Please See "Surgeries" Tab for more information regarding the  procedure.    Cardiac Studies  TTE 07/11/20: IMPRESSIONS  1. Distal septal and apical akinesis with overall moderate-severe LV  dysfunction; apical thrombus noted using definity.  2. Left ventricular ejection fraction, by estimation, is 30 to 35%. The  left ventricle has moderate to severely decreased function. The left  ventricle demonstrates regional wall motion abnormalities (see scoring  diagram/findings for description). The  left ventricular internal cavity size was mildly dilated. Left ventricular  diastolic parameters are consistent with Grade I diastolic dysfunction  (impaired relaxation).  3. Right ventricular systolic function is moderately reduced. The right  ventricular size is mildly enlarged. There is moderately elevated  pulmonary artery systolic pressure.  4. Right atrial size was mildly dilated.  5. A small pericardial effusion is present.  6. The mitral valve is normal in structure. Mild mitral valve  regurgitation. No evidence of mitral stenosis.  7. Tricuspid valve regurgitation is severe.  8. The aortic valve is tricuspid. Aortic valve regurgitation is not  visualized. Mild aortic valve  sclerosis is present, with no evidence of  aortic valve stenosis.  9. The inferior vena cava is dilated in size with >50% respiratory  variability, suggesting right atrial pressure of 8 mmHg.   Echo 02/19/2020- IMPRESSIONS  1. Left ventricular ejection fraction, by estimation, is 25 to 30%. The  left ventricle has severely decreased function. The left ventricle  demonstrates global hypokinesis. There is mild left ventricular  hypertrophy. Left ventricular diastolic parameters  are consistent with Grade I diastolic dysfunction (impaired relaxation).  2. Right ventricular systolic function is severely reduced. The right  ventricular size is moderately enlarged. There is moderately elevated  pulmonary artery systolic pressure.  3. The mitral valve is normal in  structure. No evidence of mitral valve  regurgitation. No evidence of mitral stenosis.  4. Tricuspid valve regurgitation is moderate.  5. The aortic valve is tricuspid. Aortic valve regurgitation is not  visualized. No aortic stenosis is present.  6. The inferior vena cava is normal in size with greater than 50%  respiratory variability, suggesting right atrial pressure of 3 mmHg.   Cardiac event monitor 03/2020: Normal sinus rhythm Paroxysmal atrial fibrillation at times with rapid rate Brief runs of non sustained ventricular tachycardia (4 beats)   Patient Profile     49 y.o. male with history of CAD s/p PCI to LAD and LCx, chronic systolic heart failure with LVEF 25%, mdoerately enlarged RV with severe RV dysfunction, PAD s/p Rt Fem-Pop and Lt popliteal PTA who presented for planned PV angiogram and peripheral intervention as detailed above with post-op course complicated by hypotension, anemia and bradycardia requiring transfer to the ICU. Cardiology is now consulted for concern for junctional bradycardia with HR 30s.  Assessment & Plan    #Junctional bradycardia: Occurred in the post-operative period in the setting of anemia, hypotension, and recent anesthesia. Also on home BB which may have contributed. Cardiac monitor back in 03/2020 with episodes of Afib but no juncitonal rhythms, blocks, significant pauses or episodes of profound bradycardia. Lower suspicion for underlying conduction disease. Currently off nodal agents. -No further events on the monitor  -Holding coreg--will trial back once more clinically stable  #Chronic systolic heart failure: LVEF 30-35% with distal septal and apical akinesis, moderately enlarged and hypokinetic RV. Appears mildly hypervolemic on examination. -Hold lasix today as dropped pressure when standing to work with OT (asymptomatic)--will monitor and potentially add back tomorrow -Holding entresto and coreg as above due to episode of hypotension  and bradycardia; resume once more clinically stable  #CAD s/p PCI to LAD and LCx: No current ischemic symptoms. -Continue ASA 81mg  daily and atorvastatin 40mg  daily -Holding BB and entresto for now as above  #History of LV thrombus: -Resume home apixaban once cleared from vascular surgery  #PAfib on Monitor: -Resume home apixaban once cleared from vascular surgery as above -Holding nodal agents as above  #PAD: S/p right fem-pop and left popliteal PTA with vascular surgery on 07/10/20: -Management per vascular  #DMII: -Management per primary team   CRITICAL CARE TIME: I have spent a total of 40 minutes with patient reviewing hospital notes, telemetry, EKGs, labs and examining the patient as well as establishing an assessment and plan that was discussed with the patient.  > 50% of time was spent in direct patient care. The patient is critically ill with multi-organ system failure and requires high complexity decision making for assessment and support, frequent evaluation and titration of therapies, application of advanced monitoring technologies and extensive interpretation of multiple databases.  For  questions or updates, please contact [facility] Please consult [email or web address] for contact info under        Signed, Meriam Sprague, MD  07/12/2020, 8:12 AM

## 2020-07-13 DIAGNOSIS — I2510 Atherosclerotic heart disease of native coronary artery without angina pectoris: Secondary | ICD-10-CM

## 2020-07-13 DIAGNOSIS — R001 Bradycardia, unspecified: Secondary | ICD-10-CM | POA: Diagnosis not present

## 2020-07-13 DIAGNOSIS — I5022 Chronic systolic (congestive) heart failure: Secondary | ICD-10-CM | POA: Diagnosis not present

## 2020-07-13 LAB — CBC
HCT: 27.8 % — ABNORMAL LOW (ref 39.0–52.0)
Hemoglobin: 8.4 g/dL — ABNORMAL LOW (ref 13.0–17.0)
MCH: 21.5 pg — ABNORMAL LOW (ref 26.0–34.0)
MCHC: 30.2 g/dL (ref 30.0–36.0)
MCV: 71.1 fL — ABNORMAL LOW (ref 80.0–100.0)
Platelets: 259 10*3/uL (ref 150–400)
RBC: 3.91 MIL/uL — ABNORMAL LOW (ref 4.22–5.81)
RDW: 27.3 % — ABNORMAL HIGH (ref 11.5–15.5)
WBC: 11.8 10*3/uL — ABNORMAL HIGH (ref 4.0–10.5)
nRBC: 2 % — ABNORMAL HIGH (ref 0.0–0.2)

## 2020-07-13 LAB — BASIC METABOLIC PANEL
Anion gap: 10 (ref 5–15)
BUN: 14 mg/dL (ref 6–20)
CO2: 22 mmol/L (ref 22–32)
Calcium: 8.4 mg/dL — ABNORMAL LOW (ref 8.9–10.3)
Chloride: 102 mmol/L (ref 98–111)
Creatinine, Ser: 1.13 mg/dL (ref 0.61–1.24)
GFR, Estimated: >60 mL/min (ref >60)
Glucose, Bld: 154 mg/dL — ABNORMAL HIGH (ref 70–99)
Potassium: 4.2 mmol/L (ref 3.5–5.1)
Sodium: 134 mmol/L — ABNORMAL LOW (ref 135–145)

## 2020-07-13 LAB — GLUCOSE, CAPILLARY
Glucose-Capillary: 145 mg/dL — ABNORMAL HIGH (ref 70–99)
Glucose-Capillary: 159 mg/dL — ABNORMAL HIGH (ref 70–99)
Glucose-Capillary: 164 mg/dL — ABNORMAL HIGH (ref 70–99)
Glucose-Capillary: 135 mg/dL — ABNORMAL HIGH (ref 70–99)
Glucose-Capillary: 114 mg/dL — ABNORMAL HIGH (ref 70–99)

## 2020-07-13 LAB — HEPARIN LEVEL (UNFRACTIONATED)
Heparin Unfractionated: 0.44 IU/mL (ref 0.30–0.70)
Heparin Unfractionated: 0.62 IU/mL (ref 0.30–0.70)

## 2020-07-13 NOTE — Progress Notes (Signed)
ANTICOAGULATION CONSULT NOTE  Pharmacy Consult for Heparin Indication: PVD with hx DVT  Allergies  Allergen Reactions  . Shrimp [Shellfish Allergy] Swelling    tongue    Patient Measurements: Height: 5\' 11"  (180.3 cm) Weight: 86.3 kg (190 lb 4.1 oz) IBW/kg (Calculated) : 75.3  Vital Signs: Temp: 98.3 F (36.8 C) (12/04 0722) Temp Source: Oral (12/04 0722) BP: 102/72 (12/04 0722) Pulse Rate: 77 (12/04 0722)  Labs: Recent Labs    07/11/20 0133 07/11/20 0849 07/11/20 2228 07/11/20 2228 07/12/20 0703 07/12/20 2230 07/13/20 0652  HGB   < > 9.0* 8.4*   < > 8.1*  --  8.4*  HCT   < > 29.7* 27.5*  --  28.1*  --  27.8*  PLT   < >  --  250  --  227  --  259  HEPARINUNFRC  --   --   --   --   --  0.24* 0.44  CREATININE  --  1.25*  --   --  1.11  --  1.13  TROPONINIHS  --  19*  --   --   --   --   --    < > = values in this interval not displayed.    Estimated Creatinine Clearance: 84.2 mL/min (by C-G formula based on SCr of 1.13 mg/dL).  Assessment: 49 y.o. male with h/o PAD and DVT on Eliquis PTA.  Eliquis, ASA, and Plavix held for fem-pop bypass 12/1.  Hgb stabilized post-op s/p 2 PRBC and heparin was started 12/3.  Heparin level therapeutic on 1400 units/hr.  Goal of Therapy:  Heparin level 0.3-0.7 units/ml Monitor platelets by anticoagulation protocol: Yes   Plan:  Continue Heparin at 1400 units/hr Will order heparin level this afternoon for confirmation. Heparin level and CBC daily while on heparin.  14/3, Pharm.D., BCPS Clinical Pharmacist  **Pharmacist phone directory can be found on [email or web address] listed under [facility].  07/13/2020 9:56 AM

## 2020-07-13 NOTE — Progress Notes (Signed)
ANTICOAGULATION CONSULT NOTE  Pharmacy Consult for Heparin Indication: PVD with hx DVT  Allergies  Allergen Reactions  . Shrimp [Shellfish Allergy] Swelling    tongue    Patient Measurements: Height: 5\' 11"  (180.3 cm) Weight: 86.3 kg (190 lb 4.1 oz) IBW/kg (Calculated) : 75.3  Vital Signs: Temp: 97.9 F (36.6 C) (12/04 1346) Temp Source: Oral (12/04 1346) BP: 104/77 (12/04 1346) Pulse Rate: 77 (12/04 1346)  Labs: Recent Labs    07/11/20 0133 07/11/20 0849 07/11/20 2228 07/11/20 2228 07/12/20 0703 07/12/20 2230 07/13/20 0652 07/13/20 1440  HGB   < > 9.0* 8.4*   < > 8.1*  --  8.4*  --   HCT   < > 29.7* 27.5*  --  28.1*  --  27.8*  --   PLT   < >  --  250  --  227  --  259  --   HEPARINUNFRC  --   --   --   --   --  0.24* 0.44 0.62  CREATININE  --  1.25*  --   --  1.11  --  1.13  --   TROPONINIHS  --  19*  --   --   --   --   --   --    < > = values in this interval not displayed.    Estimated Creatinine Clearance: 84.2 mL/min (by C-G formula based on SCr of 1.13 mg/dL).  Assessment: 49 y.o. male with h/o PAD and DVT on Eliquis PTA.  Eliquis, ASA, and Plavix held for fem-pop bypass 12/1.  Hgb stabilized post-op s/p 2 PRBC and heparin was started 12/3.  Heparin level therapeutic on 1400 units/hr but rising.  Will reduce rate slightly to keep within goal range.  Goal of Therapy:  Heparin level 0.3-0.7 units/ml Monitor platelets by anticoagulation protocol: Yes   Plan:  Decrease Heparin at 1350 units/hr Heparin level and CBC daily while on heparin.  14/3, Pharm.D., BCPS Clinical Pharmacist  **Pharmacist phone directory can be found on [email or web address] listed under [facility].  07/13/2020 3:16 PM

## 2020-07-13 NOTE — Progress Notes (Addendum)
Progress Note  Patient Name: Paul [name]. Date of Encounter: 07/13/2020  [facility] Cardiologist: Rollene Rotunda, MD   Subjective   Denies any chest pain or dyspnea.  BP 112/83 this morning, SPO2 100% on room air.  Stable labs, creatinine 1.1  Inpatient Medications    Scheduled Meds: . ARIPiprazole  5 mg Oral Daily  . aspirin EC  81 mg Oral Daily  . atorvastatin  40 mg Oral QHS  . Chlorhexidine Gluconate Cloth  6 each Topical Daily  . citalopram  20 mg Oral BID  . docusate sodium  100 mg Oral Daily  . ferrous sulfate  325 mg Oral Q breakfast  . insulin aspart  0-9 Units Subcutaneous TID WC  . multivitamin with minerals  1 tablet Oral Daily  . pantoprazole  40 mg Oral Daily  . traZODone  50 mg Oral QHS   Continuous Infusions: . sodium chloride Stopped (07/12/20 1230)  . heparin 1,400 Units/hr (07/13/20 0935)  . magnesium sulfate bolus IVPB     PRN Meds: acetaminophen **OR** acetaminophen, alum & mag hydroxide-simeth, bisacodyl, guaiFENesin-dextromethorphan, hydrALAZINE, magnesium sulfate bolus IVPB, metoprolol tartrate, nitroGLYCERIN, ondansetron, oxyCODONE-acetaminophen, phenol, potassium chloride, senna-docusate, sodium phosphate   Vital Signs    Vitals:   07/13/20 0406 07/13/20 0423 07/13/20 0722 07/13/20 1046  BP: 116/88 102/70 102/72 112/83  Pulse: (!) 119 76 77 81  Resp:   17 19  Temp: 98.7 F (37.1 C) 98.7 F (37.1 C) 98.3 F (36.8 C) 98.2 F (36.8 C)  TempSrc: Oral Oral Oral Oral  SpO2: 98%  100% 100%  Weight:      Height:        Intake/Output Summary (Last 24 hours) at 07/13/2020 1246 Last data filed at 07/13/2020 1045 Gross per 24 hour  Intake 668.2 ml  Output 450 ml  Net 218.2 ml   Last 3 Weights 07/12/2020 07/11/2020 07/10/2020  Weight (lbs) 190 lb 4.1 oz 177 lb 4 oz 165 lb  Weight (kg) 86.3 kg 80.4 kg 74.844 kg      Telemetry    NSR - Personally Reviewed  ECG    No new EKG- Personally Reviewed  Physical Exam   GEN: No  acute distress.  Laying comfortably.  Somnolent but easily arousable. Neck: No JVD Cardiac: RRR, no murmurs, rubs, or gallops.  Respiratory: Clear to auscultation bilaterally. GI: Soft, nontender, non-distended  MS: Right leg incision site with bandages in place and c/d/i. Left leg warm. No edema Neuro:  Nonfocal  Psych: Normal affect   Labs    High Sensitivity Troponin:   Recent Labs  Lab 07/11/20 0849  TROPONINIHS 19*      Chemistry Recent Labs  Lab 07/11/20 0849 07/12/20 0703 07/13/20 0652  NA 133* 136 134*  K 5.2* 4.5 4.2  CL 105 105 102  CO2 16* 23 22  GLUCOSE 267* 224* 154*  BUN 16 15 14   CREATININE 1.25* 1.11 1.13  CALCIUM 8.1* 8.2* 8.4*  PROT 6.6  --   --   ALBUMIN 2.4*  --   --   AST 27  --   --   ALT 17  --   --   ALKPHOS 55  --   --   BILITOT 1.6*  --   --   GFRNONAA >60 >60 >60  ANIONGAP 12 8 10      Hematology Recent Labs  Lab 07/11/20 2228 07/12/20 0703 07/13/20 0652  WBC 10.9* 10.0 11.8*  RBC 3.97* 3.95* 3.91*  HGB 8.4*  8.1* 8.4*  HCT 27.5* 28.1* 27.8*  MCV 69.3* 71.1* 71.1*  MCH 21.2* 20.5* 21.5*  MCHC 30.5 28.8* 30.2  RDW 25.7* 26.4* 27.3*  PLT 250 227 259    BNPNo results for input(s): BNP, PROBNP in the last 168 hours.   DDimer No results for input(s): DDIMER in the last 168 hours.   Radiology    No results found.  Cardiac Studies  TTE 07/11/20: IMPRESSIONS  1. Distal septal and apical akinesis with overall moderate-severe LV  dysfunction; apical thrombus noted using definity.  2. Left ventricular ejection fraction, by estimation, is 30 to 35%. The  left ventricle has moderate to severely decreased function. The left  ventricle demonstrates regional wall motion abnormalities (see scoring  diagram/findings for description). The  left ventricular internal cavity size was mildly dilated. Left ventricular  diastolic parameters are consistent with Grade I diastolic dysfunction  (impaired relaxation).  3. Right ventricular  systolic function is moderately reduced. The right  ventricular size is mildly enlarged. There is moderately elevated  pulmonary artery systolic pressure.  4. Right atrial size was mildly dilated.  5. A small pericardial effusion is present.  6. The mitral valve is normal in structure. Mild mitral valve  regurgitation. No evidence of mitral stenosis.  7. Tricuspid valve regurgitation is severe.  8. The aortic valve is tricuspid. Aortic valve regurgitation is not  visualized. Mild aortic valve sclerosis is present, with no evidence of  aortic valve stenosis.  9. The inferior vena cava is dilated in size with >50% respiratory  variability, suggesting right atrial pressure of 8 mmHg.   Echo 02/19/2020- IMPRESSIONS  1. Left ventricular ejection fraction, by estimation, is 25 to 30%. The  left ventricle has severely decreased function. The left ventricle  demonstrates global hypokinesis. There is mild left ventricular  hypertrophy. Left ventricular diastolic parameters  are consistent with Grade I diastolic dysfunction (impaired relaxation).  2. Right ventricular systolic function is severely reduced. The right  ventricular size is moderately enlarged. There is moderately elevated  pulmonary artery systolic pressure.  3. The mitral valve is normal in structure. No evidence of mitral valve  regurgitation. No evidence of mitral stenosis.  4. Tricuspid valve regurgitation is moderate.  5. The aortic valve is tricuspid. Aortic valve regurgitation is not  visualized. No aortic stenosis is present.  6. The inferior vena cava is normal in size with greater than 50%  respiratory variability, suggesting right atrial pressure of 3 mmHg.   Cardiac event monitor 03/2020: Normal sinus rhythm Paroxysmal atrial fibrillation at times with rapid rate Brief runs of non sustained ventricular tachycardia (4 beats)   Patient Profile     49 y.o. male with history of CAD s/p PCI to LAD and  LCx, chronic systolic heart failure with LVEF 25%, mdoerately enlarged RV with severe RV dysfunction, PAD s/p Rt Fem-Pop and Lt popliteal PTA who presented for planned PV angiogram and peripheral intervention as detailed above with post-op course complicated by hypotension, anemia and bradycardia requiring transfer to the ICU. Cardiology is now consulted for concern for junctional bradycardia with HR 30s.  Assessment & Plan    #Junctional bradycardia: Occurred in the post-operative period in the setting of anemia, hypotension, and recent anesthesia. Also on home BB which may have contributed. Cardiac monitor back in 03/2020 with episodes of Afib but no juncitonal rhythms, blocks, significant pauses or episodes of profound bradycardia. Lower suspicion for underlying conduction disease. Currently off nodal agents. -No further events on the monitor  -  Hold carvedilol  #Chronic systolic heart failure: LVEF 30-35% with distal septal and apical akinesis, moderately enlarged and hypokinetic RV. Appears mildly hypervolemic on examination. -Hold lasix as dropped pressure when standing to work with OT (asymptomatic)--will monitor  -Holding entresto and coreg as above due to episode of hypotension and bradycardia; may be able to restart Entresto tomorrow  #CAD s/p PCI to LAD and LCx: No current ischemic symptoms. -Continue ASA 81mg  daily and atorvastatin 40mg  daily -Holding BB and entresto for now as above  #History of LV thrombus: -Resume home apixaban once cleared from vascular surgery  #PAfib on Monitor: -Resume home apixaban once cleared from vascular surgery as above -Holding nodal agents as above  #PAD: S/p right fem-pop and left popliteal PTA with vascular surgery on 07/10/20: -Management per vascular   For questions or updates, please contact [facility] Please consult [email or web address] for contact info under        Signed, , MD  07/13/2020, 12:46 PM

## 2020-07-13 NOTE — Progress Notes (Signed)
OT Cancellation Note  Patient Details Name: Paul [name]. MRN: [id number] DOB: Oct 10, 1970   Cancelled Treatment:    Reason Eval/Treat Not Completed: Patient at procedure or test/ unavailable;Other (comment)pt working with mobility tech upon OTA arrival, will check back as time allows for OT session.   Audery Amel., COTA/L Acute Rehabilitation Services [phone]   Angelina Pih 07/13/2020, 3:15 PM

## 2020-07-13 NOTE — Progress Notes (Signed)
    Subjective  - POD #3 status post right popliteal to posterior tibial bypass graft and left popliteal angioplasty.  Rest pain symptoms have resolved   Physical Exam:  Drainage and slight skin separation on incision. Both feet are warm with PT Doppler signals       Assessment/Plan:  POD #3  Patient is on heparin drip for apical thrombus.  He will need to be transitioned to Eliquis prior to discharge Acute blood loss anemia: Hemoglobin remained stable Appreciate cardiology assistance with postoperative issues Continue to mobilize. I messaged Ronnald Nian to see if we have options for assistance with this patient at discharge given that he lives in his car.  Paul [name] 07/13/2020 4:05 PM --  Vitals:   07/13/20 1346 07/13/20 1550  BP: 104/77 112/83  Pulse: 77 75  Resp: 17 18  Temp: 97.9 F (36.6 C) 97.9 F (36.6 C)  SpO2: 100% 100%    Intake/Output Summary (Last 24 hours) at 07/13/2020 1605 Last data filed at 07/13/2020 1551 Gross per 24 hour  Intake 288 ml  Output 600 ml  Net -312 ml     Laboratory CBC    Component Value Date/Time   WBC 11.8 (H) 07/13/2020 0652   HGB 8.4 (L) 07/13/2020 0652   HGB 9.1 (L) 06/28/2020 0946   HCT 27.8 (L) 07/13/2020 0652   HCT 33.4 (L) 06/28/2020 0946   PLT 259 07/13/2020 0652   PLT 335 06/28/2020 0946    BMET    Component Value Date/Time   NA 134 (L) 07/13/2020 0652   NA 136 06/28/2020 0946   K 4.2 07/13/2020 0652   CL 102 07/13/2020 0652   CO2 22 07/13/2020 0652   GLUCOSE 154 (H) 07/13/2020 0652   BUN 14 07/13/2020 0652   BUN 9 06/28/2020 0946   CREATININE 1.13 07/13/2020 0652   CALCIUM 8.4 (L) 07/13/2020 0652   GFRNONAA >60 07/13/2020 0652   GFRAA 110 06/28/2020 0946    COAG Lab Results  Component Value Date   INR 1.1 (H) 08/24/2011   No results found for: PTT  Antibiotics Anti-infectives (From admission, onward)   Start     Dose/Rate Route Frequency Ordered Stop   07/10/20 2359  ceFAZolin  (ANCEF) IVPB 2g/100 mL premix        2 g 200 mL/hr over 30 Minutes Intravenous Every 8 hours 07/10/20 2122 07/11/20 1136   07/10/20 1003  ceFAZolin (ANCEF) 2-4 GM/100ML-% IVPB       Note to Pharmacy: [name]   : cabinet override      07/10/20 1003 07/10/20 2214       Paul [name], M.D., [facility] [place] Office: [phone] Pager:  [phone]

## 2020-07-13 NOTE — Progress Notes (Signed)
Mobility Specialist: Progress Note   07/13/20 1523  Mobility  Activity Ambulated in hall  Level of Assistance Minimal assist, patient does 75% or more  Assistive Device Front wheel walker  Distance Ambulated (ft) 90 ft  Mobility Response Tolerated fair  Mobility performed by Mobility specialist  $Mobility charge 1 Mobility   Pre-Mobility: 77 HR, 106/86 BP, 100% SpO2 Post-Mobility: 80 HR, 107/89 BP, 100% SpO2  Pt c/o pain in RLE during ambulation, no rating given. Pt back to bed with call bell and phone at his side.   [facility] Day Mobility Specialist

## 2020-07-13 NOTE — Progress Notes (Signed)
Called to patient's room by NT, noted saturated dressing that has separated from surgical incision at right thigh and groin. Noted Proximal thigh incision unapproximated and oozing serous sanguineous fluid. Closed incision with steri-strips and replaced dressing with Abd, guaze and tape. Will continue to monitor for drainage.

## 2020-07-14 DIAGNOSIS — I5042 Chronic combined systolic (congestive) and diastolic (congestive) heart failure: Secondary | ICD-10-CM

## 2020-07-14 DIAGNOSIS — I739 Peripheral vascular disease, unspecified: Secondary | ICD-10-CM

## 2020-07-14 DIAGNOSIS — R001 Bradycardia, unspecified: Secondary | ICD-10-CM | POA: Diagnosis not present

## 2020-07-14 LAB — BASIC METABOLIC PANEL
Anion gap: 7 (ref 5–15)
BUN: 15 mg/dL (ref 6–20)
CO2: 22 mmol/L (ref 22–32)
Calcium: 8 mg/dL — ABNORMAL LOW (ref 8.9–10.3)
Chloride: 100 mmol/L (ref 98–111)
Creatinine, Ser: 1.04 mg/dL (ref 0.61–1.24)
GFR, Estimated: >60 mL/min (ref >60)
Glucose, Bld: 161 mg/dL — ABNORMAL HIGH (ref 70–99)
Potassium: 4.6 mmol/L (ref 3.5–5.1)
Sodium: 129 mmol/L — ABNORMAL LOW (ref 135–145)

## 2020-07-14 LAB — CBC
HCT: 26.9 % — ABNORMAL LOW (ref 39.0–52.0)
Hemoglobin: 8.1 g/dL — ABNORMAL LOW (ref 13.0–17.0)
MCH: 21.4 pg — ABNORMAL LOW (ref 26.0–34.0)
MCHC: 30.1 g/dL (ref 30.0–36.0)
MCV: 71.2 fL — ABNORMAL LOW (ref 80.0–100.0)
Platelets: 292 10*3/uL (ref 150–400)
RBC: 3.78 MIL/uL — ABNORMAL LOW (ref 4.22–5.81)
RDW: 27.8 % — ABNORMAL HIGH (ref 11.5–15.5)
WBC: 10 10*3/uL (ref 4.0–10.5)
nRBC: 1.6 % — ABNORMAL HIGH (ref 0.0–0.2)

## 2020-07-14 LAB — GLUCOSE, CAPILLARY
Glucose-Capillary: 132 mg/dL — ABNORMAL HIGH (ref 70–99)
Glucose-Capillary: 139 mg/dL — ABNORMAL HIGH (ref 70–99)
Glucose-Capillary: 153 mg/dL — ABNORMAL HIGH (ref 70–99)
Glucose-Capillary: 162 mg/dL — ABNORMAL HIGH (ref 70–99)

## 2020-07-14 LAB — HEPARIN LEVEL (UNFRACTIONATED): Heparin Unfractionated: 0.46 IU/mL (ref 0.30–0.70)

## 2020-07-14 MED ORDER — METOPROLOL SUCCINATE ER 25 MG PO TB24
25.0000 mg | ORAL_TABLET | Freq: Every day | ORAL | Status: DC
  Administered 2020-07-14 – 2020-07-19 (×6): 25 mg via ORAL
  Filled 2020-07-14 (×6): qty 1

## 2020-07-14 MED ORDER — SACUBITRIL-VALSARTAN 24-26 MG PO TABS
1.0000 | ORAL_TABLET | Freq: Two times a day (BID) | ORAL | Status: DC
  Administered 2020-07-14 – 2020-07-19 (×11): 1 via ORAL
  Filled 2020-07-14 (×11): qty 1

## 2020-07-14 NOTE — Progress Notes (Addendum)
Progress Note  Patient Name: Paul [name]. Date of Encounter: 07/14/2020  [facility] Cardiologist: Rollene Rotunda, MD   Subjective   BP 122/81, pulse 74, SPO2 94% on room air.  Denies any chest pain or dyspnea.  Inpatient Medications    Scheduled Meds: . ARIPiprazole  5 mg Oral Daily  . aspirin EC  81 mg Oral Daily  . atorvastatin  40 mg Oral QHS  . Chlorhexidine Gluconate Cloth  6 each Topical Daily  . citalopram  20 mg Oral BID  . docusate sodium  100 mg Oral Daily  . ferrous sulfate  325 mg Oral Q breakfast  . insulin aspart  0-9 Units Subcutaneous TID WC  . multivitamin with minerals  1 tablet Oral Daily  . pantoprazole  40 mg Oral Daily  . traZODone  50 mg Oral QHS   Continuous Infusions: . sodium chloride Stopped (07/12/20 1230)  . heparin 1,350 Units/hr (07/13/20 1553)  . magnesium sulfate bolus IVPB     PRN Meds: acetaminophen **OR** acetaminophen, alum & mag hydroxide-simeth, bisacodyl, guaiFENesin-dextromethorphan, hydrALAZINE, magnesium sulfate bolus IVPB, metoprolol tartrate, nitroGLYCERIN, ondansetron, oxyCODONE-acetaminophen, phenol, potassium chloride, senna-docusate, sodium phosphate   Vital Signs    Vitals:   07/13/20 2311 07/14/20 0238 07/14/20 0726 07/14/20 1049  BP: 112/76 115/76 112/82 122/81  Pulse: 75 72 72 74  Resp:  (!) 22 19 20   Temp: 98.7 F (37.1 C) 98.7 F (37.1 C) 98 F (36.7 C) 98 F (36.7 C)  TempSrc: Oral Oral Oral Oral  SpO2: 98% 98% 94% 94%  Weight:      Height:        Intake/Output Summary (Last 24 hours) at 07/14/2020 1332 Last data filed at 07/13/2020 1551 Gross per 24 hour  Intake --  Output 350 ml  Net -350 ml   Last 3 Weights 07/12/2020 07/11/2020 07/10/2020  Weight (lbs) 190 lb 4.1 oz 177 lb 4 oz 165 lb  Weight (kg) 86.3 kg 80.4 kg 74.844 kg      Telemetry    NSR, 30 minute run of likely atrial tachycardia, rate 110s - Personally Reviewed  ECG    No new EKG- Personally Reviewed  Physical Exam    GEN: No acute distress.  Laying comfortably.  Alert and oriented Neck: No JVD Cardiac: RRR, no murmurs, rubs, or gallops.  Respiratory: Clear to auscultation bilaterally. GI: Soft, nontender, non-distended  MS: Right leg incision site with bandages in place and c/d/i. Left leg warm. No edema Neuro:  Nonfocal  Psych: Normal affect   Labs    High Sensitivity Troponin:   Recent Labs  Lab 07/11/20 0849  TROPONINIHS 19*      Chemistry Recent Labs  Lab 07/11/20 0849 07/12/20 0703 07/13/20 0652  NA 133* 136 134*  K 5.2* 4.5 4.2  CL 105 105 102  CO2 16* 23 22  GLUCOSE 267* 224* 154*  BUN 16 15 14   CREATININE 1.25* 1.11 1.13  CALCIUM 8.1* 8.2* 8.4*  PROT 6.6  --   --   ALBUMIN 2.4*  --   --   AST 27  --   --   ALT 17  --   --   ALKPHOS 55  --   --   BILITOT 1.6*  --   --   GFRNONAA >60 >60 >60  ANIONGAP 12 8 10      Hematology Recent Labs  Lab 07/12/20 0703 07/13/20 0652 07/14/20 0207  WBC 10.0 11.8* 10.0  RBC 3.95* 3.91* 3.78*  HGB 8.1* 8.4* 8.1*  HCT 28.1* 27.8* 26.9*  MCV 71.1* 71.1* 71.2*  MCH 20.5* 21.5* 21.4*  MCHC 28.8* 30.2 30.1  RDW 26.4* 27.3* 27.8*  PLT 227 259 292    BNPNo results for input(s): BNP, PROBNP in the last 168 hours.   DDimer No results for input(s): DDIMER in the last 168 hours.   Radiology    No results found.  Cardiac Studies  TTE 07/11/20: IMPRESSIONS  1. Distal septal and apical akinesis with overall moderate-severe LV  dysfunction; apical thrombus noted using definity.  2. Left ventricular ejection fraction, by estimation, is 30 to 35%. The  left ventricle has moderate to severely decreased function. The left  ventricle demonstrates regional wall motion abnormalities (see scoring  diagram/findings for description). The  left ventricular internal cavity size was mildly dilated. Left ventricular  diastolic parameters are consistent with Grade I diastolic dysfunction  (impaired relaxation).  3. Right ventricular  systolic function is moderately reduced. The right  ventricular size is mildly enlarged. There is moderately elevated  pulmonary artery systolic pressure.  4. Right atrial size was mildly dilated.  5. A small pericardial effusion is present.  6. The mitral valve is normal in structure. Mild mitral valve  regurgitation. No evidence of mitral stenosis.  7. Tricuspid valve regurgitation is severe.  8. The aortic valve is tricuspid. Aortic valve regurgitation is not  visualized. Mild aortic valve sclerosis is present, with no evidence of  aortic valve stenosis.  9. The inferior vena cava is dilated in size with >50% respiratory  variability, suggesting right atrial pressure of 8 mmHg.   Echo 02/19/2020- IMPRESSIONS  1. Left ventricular ejection fraction, by estimation, is 25 to 30%. The  left ventricle has severely decreased function. The left ventricle  demonstrates global hypokinesis. There is mild left ventricular  hypertrophy. Left ventricular diastolic parameters  are consistent with Grade I diastolic dysfunction (impaired relaxation).  2. Right ventricular systolic function is severely reduced. The right  ventricular size is moderately enlarged. There is moderately elevated  pulmonary artery systolic pressure.  3. The mitral valve is normal in structure. No evidence of mitral valve  regurgitation. No evidence of mitral stenosis.  4. Tricuspid valve regurgitation is moderate.  5. The aortic valve is tricuspid. Aortic valve regurgitation is not  visualized. No aortic stenosis is present.  6. The inferior vena cava is normal in size with greater than 50%  respiratory variability, suggesting right atrial pressure of 3 mmHg.   Cardiac event monitor 03/2020: Normal sinus rhythm Paroxysmal atrial fibrillation at times with rapid rate Brief runs of non sustained ventricular tachycardia (4 beats)   Patient Profile     49 y.o. male with history of CAD s/p PCI to LAD and  LCx, chronic systolic heart failure with LVEF 25%, mdoerately enlarged RV with severe RV dysfunction, PAD s/p Rt Fem-Pop and Lt popliteal PTA who presented for planned PV angiogram and peripheral intervention as detailed above with post-op course complicated by hypotension, anemia and bradycardia requiring transfer to the ICU. Cardiology is now consulted for concern for junctional bradycardia with HR 30s.  Assessment & Plan    #Junctional bradycardia: Occurred in the post-operative period in the setting of anemia, hypotension, and recent anesthesia. Also on home BB which may have contributed. Cardiac monitor back in 03/2020 with episodes of Afib but no juncitonal rhythms, blocks, significant pauses or episodes of profound bradycardia. Lower suspicion for underlying conduction disease. Currently off nodal agents. -No further events on  the monitor  -Discontinued coreg 25 mg BID.  Having intermittent runs of what appear to be atrial tachycardia with elevated rates, will add toprol XL 25 mg daily and monitor  #Chronic systolic heart failure: LVEF 30-35% with distal septal and apical akinesis, moderately enlarged and hypokinetic RV.  -Hold lasix for now, appears euvolemic, can likely restart diuresis tomorrow -Start toprol XL as above -Restarted entresto  #CAD s/p PCI to LAD and LCx: No current ischemic symptoms. -Continue ASA 81mg  daily and atorvastatin 40mg  daily -Restarting BB  #History of LV thrombus: -Resume home apixaban once cleared from vascular surgery  #PAfib on Monitor: -Resume home apixaban once cleared from vascular surgery as above  #PAD: S/p right fem-pop and left popliteal PTA with vascular surgery on 07/10/20: -Management per vascular   For questions or updates, please contact [facility] Please consult [email or web address] for contact info under        Signed, , MD  07/14/2020, 1:32 PM

## 2020-07-14 NOTE — Progress Notes (Signed)
Occupational Therapy Treatment Patient Details Name: Paul [name]. MRN: [id number] DOB: [birth year] Today's Date: 07/14/2020    History of present illness 49 yo admitted 11/30 with right popliteal occlusion. Pt s/p bil LE thrombectomy several months ago. 12/1 Rt fem pop BPG and Left PTA with post op bradycardia and hypotension with transfer to ICU. PMhx: PTSD, MI, HTN, DM, bipolar, homelessness, HLD, CAD, PAD   OT comments  Pt. Seen for skilled OT session.  RN states short distance ambulation only secondary to monitoring bleeding for RLE bandage.  Pt. Min a for transfers and approx. 5 steps from eob and back in preparation for b.room and grooming tasks.  Min a for bles for bed mobility.  Will plan for A/E next session for LB ADLS along with cont. Education on fall prevention.  Pt. Reporting he is going to friends house upon d/c.    Follow Up Recommendations  Supervision - Intermittent    Equipment Recommendations  Other (comment)    Recommendations for Other Services      Precautions / Restrictions Precautions Precautions: Fall Precaution Comments: watch RLE bandages for bleeding       Mobility Bed Mobility Overal bed mobility: Needs Assistance Bed Mobility: Sit to Sidelying;Rolling Rolling: Min assist       Sit to sidelying: Min assist General bed mobility comments: A to lift BLE back onto bed, with cues for tech. to get into bed  Transfers Overall transfer level: Needs assistance Equipment used: 4-wheeled walker Transfers: Sit to/from [employer] Sit to Stand: Min assist Stand pivot transfers: Min assist       General transfer comment: cues for hand placement. show shuffled gait. unable to make it to the sink. pt. taking long pauses but not stating why when asked.    Balance                                           ADL either performed or assessed with clinical judgement   ADL Overall ADL's : Needs assistance/impaired                      Lower Body Dressing: Maximal assistance;Cueing for safety;Cueing for sequencing;Cueing for compensatory techniques;Sitting/lateral leans Lower Body Dressing Details (indicate cue type and reason): attempted mutliple ways to access BLES for LB dressing. pt. unable.  will benefit from use of A/E next session             Functional mobility during ADLs: Moderate assistance General ADL Comments: still unable to reach BLEs for LB dressing. plans to bring a/e to next session for introduction of use.  attempted ambulation to sink. pt. moving with shuffled gait. denies pain but cont. to say he feels bloated.  introduction to fall prevention strategies.  examples provided pt. verbalized understanding but will review again and bring handouts     Vision       Perception     Praxis      Cognition Arousal/Alertness: Awake/alert   Overall Cognitive Status: No family/caregiver present to determine baseline cognitive functioning                                 General Comments: at times did not appear to understand instructions.  i provided verbal and demonstrated and he just giggled and then was  falling back onto bed and did not self correct.  i assisted him back into sitting and he cont. to giggle. i reviewed again the instructions and demo and he was unable to follow the cues        Exercises     Shoulder Instructions       General Comments      Pertinent Vitals/ Pain       Pain Assessment: Faces Faces Pain Scale: Hurts little more Pain Location: stomach bloating  Home Living                                          Prior Functioning/Environment              Frequency  Min 2X/week        Progress Toward Goals  OT Goals(current goals can now be found in the care plan section)  Progress towards OT goals: Progressing toward goals     Plan Discharge plan remains appropriate    Co-evaluation                  AM-PAC OT "6 Clicks" Daily Activity     Outcome Measure   Help from another person eating meals?: None Help from another person taking care of personal grooming?: A Little Help from another person toileting, which includes using toliet, bedpan, or urinal?: A Lot Help from another person bathing (including washing, rinsing, drying)?: A Lot Help from another person to put on and taking off regular upper body clothing?: A Little Help from another person to put on and taking off regular lower body clothing?: A Lot 6 Click Score: 16    End of Session Equipment Utilized During Treatment: Gait belt;Other (comment) (4 wheel walker)  OT Visit Diagnosis: Other abnormalities of gait and mobility (R26.89);Muscle weakness (generalized) (M62.81);Pain Pain - Right/Left: Right Pain - part of body: Leg   Activity Tolerance Patient tolerated treatment well   Patient Left in bed;with call bell/phone within reach   Nurse Communication Other (comment) (spoke with rn blood showing through the bandage. pt. reports it was just changed. rn states amb. short distance this day and monitor bleeding.  no increase of bleeding noted reported to rn no increased bleeding and that pt. c/o bloating)        Time: 3149-7026 OT Time Calculation (min): 14 min  Charges: OT General Charges $OT Visit: 1 Visit OT Treatments $Self Care/Home Management : 8-22 mins  Boneta Lucks, COTA/L Acute Rehabilitation [phone]   Robet Leu 07/14/2020, 10:13 AM

## 2020-07-14 NOTE — Progress Notes (Signed)
    Subjective  - POD #4, status post right popliteal to posterior tibial bypass graft with vein and left popliteal angioplasty.  He states that his feet feel much better.  He was able to stand up and ambulate a little bit.   Physical Exam:  PT Doppler signals bilaterally.  Incisions are healing nicely.  There is some skin separation which is not unexpected given the quality of his skin.       Assessment/Plan:  POD #4  Appreciate cardiology input.  His issues appear to be resolving. The patient is on Paul [name] for cardiac thrombus.  This will need to be transitioned to Paul [name] prior to discharge.  He will also need aspirin and Plavix given the drug-coated balloon angioplasty.  He will continue statin therapy.  Paul [name] 07/14/2020 11:33 AM --  Vitals:   07/14/20 0726 07/14/20 1049  BP: 112/82 122/81  Pulse: 72 74  Resp: 19 20  Temp: 98 F (36.7 C) 98 F (36.7 C)  SpO2: 94% 94%    Intake/Output Summary (Last 24 hours) at 07/14/2020 1133 Last data filed at 07/13/2020 1551 Gross per 24 hour  Intake --  Output 350 ml  Net -350 ml     Laboratory CBC    Component Value Date/Time   WBC 10.0 07/14/2020 0207   HGB 8.1 (L) 07/14/2020 0207   HGB 9.1 (L) 06/28/2020 0946   HCT 26.9 (L) 07/14/2020 0207   HCT 33.4 (L) 06/28/2020 0946   PLT 292 07/14/2020 0207   PLT 335 06/28/2020 0946    BMET    Component Value Date/Time   NA 134 (L) 07/13/2020 0652   NA 136 06/28/2020 0946   K 4.2 07/13/2020 0652   CL 102 07/13/2020 0652   CO2 22 07/13/2020 0652   GLUCOSE 154 (H) 07/13/2020 0652   BUN 14 07/13/2020 0652   BUN 9 06/28/2020 0946   CREATININE 1.13 07/13/2020 0652   CALCIUM 8.4 (L) 07/13/2020 0652   GFRNONAA >60 07/13/2020 0652   GFRAA 110 06/28/2020 0946    COAG Lab Results  Component Value Date   INR 1.1 (H) 08/24/2011   No results found for: PTT  Antibiotics Anti-infectives (From admission, onward)   Start     Dose/Rate Route Frequency Ordered Stop    07/10/20 2359  ceFAZolin (ANCEF) IVPB 2g/100 mL premix        2 g 200 mL/hr over 30 Minutes Intravenous Every 8 hours 07/10/20 2122 07/11/20 1136   07/10/20 1003  ceFAZolin (ANCEF) 2-4 GM/100ML-% IVPB       Note to Pharmacy: [name]   : cabinet override      07/10/20 1003 07/10/20 2214       V. Charlena Cross, M.D., [facility] [place] Office: [phone] Pager:  [phone]

## 2020-07-14 NOTE — Progress Notes (Signed)
   RN called because pt having runs of atrial tach or atrial fib, HR elevated at times.   BP stable.   Coreg 25 mg bid held due to significant bradycardia.   However, w/ runs of tachycardia, would benefit from some BB.   Per Dr Bjorn Pippin, start Toprol XL 25 mg qd and see how tolerated.   Continue to monitor.   Theodore Demark, PA-C 07/14/2020 6:35 PM

## 2020-07-14 NOTE — Progress Notes (Signed)
Mobility Specialist: Progress Note   07/14/20 1518  Mobility  Activity Ambulated in room  Level of Assistance Moderate assist, patient does 50-74%  Assistive Device Four wheel walker  Distance Ambulated (ft) 40 ft  Mobility Response Tolerated well  Mobility performed by Mobility specialist  Bed Position Semi-fowlers  $Mobility charge 1 Mobility   Pre-Mobility: 76 HR, 97% SpO2 Post-Mobility: 100 HR  Pt was asx during ambulation. Pt back to bed for wound care.   [facility] [place] Mobility Specialist

## 2020-07-14 NOTE — Progress Notes (Signed)
ANTICOAGULATION CONSULT NOTE  Pharmacy Consult for Heparin Indication: PVD with hx DVT  Allergies  Allergen Reactions  . Shrimp [Shellfish Allergy] Swelling    tongue    Patient Measurements: Height: 5\' 11"  (180.3 cm) Weight: 86.3 kg (190 lb 4.1 oz) IBW/kg (Calculated) : 75.3  Vital Signs: Temp: 98 F (36.7 C) (12/05 0726) Temp Source: Oral (12/05 0726) BP: 112/82 (12/05 0726) Pulse Rate: 72 (12/05 0726)  Labs: Recent Labs    07/12/20 0703 07/12/20 2230 07/13/20 0652 07/13/20 1440 07/14/20 0207  HGB 8.1*  --  8.4*  --  8.1*  HCT 28.1*  --  27.8*  --  26.9*  PLT 227  --  259  --  292  HEPARINUNFRC  --    < > 0.44 0.62 0.46  CREATININE 1.11  --  1.13  --   --    < > = values in this interval not displayed.    Estimated Creatinine Clearance: 84.2 mL/min (by C-G formula based on SCr of 1.13 mg/dL).  Assessment: 49 y.o. male with h/o PVD and apical thrombus on Eliquis PTA.  Eliquis, ASA, and Plavix held for fem-pop bypass 12/1.  Hgb stabilized post-op s/p 2 PRBC and heparin was started 12/3.  Heparin level therapeutic on 1350 units/hr.    Goal of Therapy:  Heparin level 0.3-0.7 units/ml Monitor platelets by anticoagulation protocol: Yes   Plan:  Continue Heparin at 1350 units/hr Heparin level and CBC daily while on heparin. Follow-up restart of apixaban, ASA, and Plavix  14/3, Pharm.D., BCPS Clinical Pharmacist  **Pharmacist phone directory can be found on [email or web address] listed under [facility].  07/14/2020 9:11 AM

## 2020-07-15 DIAGNOSIS — I70229 Atherosclerosis of native arteries of extremities with rest pain, unspecified extremity: Secondary | ICD-10-CM

## 2020-07-15 DIAGNOSIS — I5043 Acute on chronic combined systolic (congestive) and diastolic (congestive) heart failure: Secondary | ICD-10-CM | POA: Diagnosis not present

## 2020-07-15 LAB — BASIC METABOLIC PANEL
Anion gap: 8 (ref 5–15)
BUN: 11 mg/dL (ref 6–20)
CO2: 23 mmol/L (ref 22–32)
Calcium: 8.1 mg/dL — ABNORMAL LOW (ref 8.9–10.3)
Chloride: 102 mmol/L (ref 98–111)
Creatinine, Ser: 0.98 mg/dL (ref 0.61–1.24)
GFR, Estimated: >60 mL/min (ref >60)
Glucose, Bld: 176 mg/dL — ABNORMAL HIGH (ref 70–99)
Potassium: 4.3 mmol/L (ref 3.5–5.1)
Sodium: 133 mmol/L — ABNORMAL LOW (ref 135–145)

## 2020-07-15 LAB — CBC
HCT: 29.3 % — ABNORMAL LOW (ref 39.0–52.0)
Hemoglobin: 8.5 g/dL — ABNORMAL LOW (ref 13.0–17.0)
MCH: 20.9 pg — ABNORMAL LOW (ref 26.0–34.0)
MCHC: 29 g/dL — ABNORMAL LOW (ref 30.0–36.0)
MCV: 72.2 fL — ABNORMAL LOW (ref 80.0–100.0)
Platelets: 310 10*3/uL (ref 150–400)
RBC: 4.06 MIL/uL — ABNORMAL LOW (ref 4.22–5.81)
RDW: 28.5 % — ABNORMAL HIGH (ref 11.5–15.5)
WBC: 8.1 10*3/uL (ref 4.0–10.5)
nRBC: 1.8 % — ABNORMAL HIGH (ref 0.0–0.2)

## 2020-07-15 LAB — GLUCOSE, CAPILLARY
Glucose-Capillary: 104 mg/dL — ABNORMAL HIGH (ref 70–99)
Glucose-Capillary: 193 mg/dL — ABNORMAL HIGH (ref 70–99)
Glucose-Capillary: 149 mg/dL — ABNORMAL HIGH (ref 70–99)
Glucose-Capillary: 161 mg/dL — ABNORMAL HIGH (ref 70–99)

## 2020-07-15 LAB — HEPARIN LEVEL (UNFRACTIONATED): Heparin Unfractionated: 0.57 IU/mL (ref 0.30–0.70)

## 2020-07-15 NOTE — TOC Progression Note (Signed)
Transition of Care (TOC) - Progression Note    Patient Details  Name: Paul [name]. MRN: [id number] Date of Birth: 04-May-1971  Transition of Care [facility]) CM/SW Contact  Eduard Roux, [place] Phone Number: 07/15/2020, 3:37 PM  Clinical Narrative:     CSW spoke with patient via phone. CSW introduced self and explained role. CSW discussed with patient PT recommendations of short term rehab at [facility]. Patient states he was going  to stay with a friend for about one week but believes it is best to go to rehab and get stronger first. CSW explained the SNF process and possible barrier. Patient has been sleeping in his car at the  [employer]. CSW inquired about disposition plan after rehab- patient states " I will always have somewhere to go ".  He reports SSI/SSA income. Patient advised he was looking for housing but there is long waiting list for low income housing. CSW advised the patient to seek assistance form the inquire with [facility] to assist with housing. Patient states no other questions or concerns at this time.  CSW will provide bed offers once available CSW will continue to follow and assist with discharge planning.  Antony Blackbird, MSW, LCSWA Clinical Social Worker      Expected Discharge Plan: Skilled Nursing Facility Barriers to Discharge: Continued Medical Work up, SNF Pending bed offer  Expected Discharge Plan and Services Expected Discharge Plan: Skilled Nursing Facility In-house Referral: [facility] Discharge Planning Services: CM Consult   Living arrangements for the past 2 months: Homeless                 DME Arranged: N/A DME Agency: NA       HH Arranged: NA HH Agency: NA         Social Determinants of Health (SDOH) Interventions    Readmission Risk Interventions No flowsheet data found.

## 2020-07-15 NOTE — Progress Notes (Addendum)
Progress Note  Patient Name: Paul [name]. Date of Encounter: 07/15/2020  Primary Cardiologist: Rollene Rotunda, MD  Subjective   Reports social stressors at home with ex wife and children. Has a friend that might be able to help him out after discharge. Has been homeless and living in his car.  Denies any CP or SOB. No dizziness or palpitations. Feeling some fluid retention.   Inpatient Medications    Scheduled Meds: . ARIPiprazole  5 mg Oral Daily  . aspirin EC  81 mg Oral Daily  . atorvastatin  40 mg Oral QHS  . Chlorhexidine Gluconate Cloth  6 each Topical Daily  . citalopram  20 mg Oral BID  . docusate sodium  100 mg Oral Daily  . ferrous sulfate  325 mg Oral Q breakfast  . insulin aspart  0-9 Units Subcutaneous TID WC  . metoprolol succinate  25 mg Oral Daily  . multivitamin with minerals  1 tablet Oral Daily  . pantoprazole  40 mg Oral Daily  . sacubitril-valsartan  1 tablet Oral BID  . traZODone  50 mg Oral QHS   Continuous Infusions: . sodium chloride Stopped (07/12/20 1230)  . heparin 1,350 Units/hr (07/13/20 1553)  . magnesium sulfate bolus IVPB     PRN Meds: acetaminophen **OR** acetaminophen, alum & mag hydroxide-simeth, bisacodyl, guaiFENesin-dextromethorphan, hydrALAZINE, magnesium sulfate bolus IVPB, metoprolol tartrate, nitroGLYCERIN, ondansetron, oxyCODONE-acetaminophen, phenol, potassium chloride, senna-docusate, sodium phosphate   Vital Signs    Vitals:   07/14/20 2310 07/15/20 0328 07/15/20 0731 07/15/20 1102  BP: 105/79 114/80 99/72 98/73   Pulse: 68 68 (!) 58 66  Resp: 15 14 18 17   Temp: 98.4 F (36.9 C) 98.7 F (37.1 C) 98 F (36.7 C) (!) 97.5 F (36.4 C)  TempSrc: Oral Oral Oral Oral  SpO2: 98% 98% 90% 94%  Weight:      Height:        Intake/Output Summary (Last 24 hours) at 07/15/2020 1221 Last data filed at 07/15/2020 0400 Gross per 24 hour  Intake 728.58 ml  Output 240 ml  Net 488.58 ml   Last 3 Weights 07/12/2020  07/11/2020 07/10/2020  Weight (lbs) 190 lb 4.1 oz 177 lb 4 oz 165 lb  Weight (kg) 86.3 kg 80.4 kg 74.844 kg     Telemetry    NSR/SB (upper 50s minimum) - Personally Reviewed   Physical Exam   GEN: No acute distress.  HEENT: Normocephalic, atraumatic, sclera non-icteric. Neck: No JVD or bruits. Cardiac: RRR no murmurs, rubs, or gallops.  Respiratory: Clear to auscultation bilaterally. Breathing is unlabored. GI: Soft, nontender, non-distended, BS +x 4. MS: no deformity. Extremities: No clubbing or cyanosis. Right leg incision with bandages in place (has had some blood oozing noted in vascular notes). Leg leg warm with somewhat taut feeling on palpation Neuro:  AAOx3. Follows commands. Psych:  Responds to questions appropriately with a normal affect.  Labs    High Sensitivity Troponin:   Recent Labs  Lab 07/11/20 0849  TROPONINIHS 19*      Cardiac EnzymesNo results for input(s): TROPONINI in the last 168 hours. No results for input(s): TROPIPOC in the last 168 hours.   Chemistry Recent Labs  Lab 07/11/20 0849 07/12/20 0703 07/13/20 0652 07/14/20 0207 07/15/20 0320  NA 133*   < > 134* 129* 133*  K 5.2*   < > 4.2 4.6 4.3  CL 105   < > 102 100 102  CO2 16*   < > 22 22 23   GLUCOSE  267*   < > 154* 161* 176*  BUN 16   < > 14 15 11   CREATININE 1.25*   < > 1.13 1.04 0.98  CALCIUM 8.1*   < > 8.4* 8.0* 8.1*  PROT 6.6  --   --   --   --   ALBUMIN 2.4*  --   --   --   --   AST 27  --   --   --   --   ALT 17  --   --   --   --   ALKPHOS 55  --   --   --   --   BILITOT 1.6*  --   --   --   --   GFRNONAA >60   < > >60 >60 >60  ANIONGAP 12   < > 10 7 8    < > = values in this interval not displayed.     Hematology Recent Labs  Lab 07/13/20 0652 07/14/20 0207 07/15/20 0320  WBC 11.8* 10.0 8.1  RBC 3.91* 3.78* 4.06*  HGB 8.4* 8.1* 8.5*  HCT 27.8* 26.9* 29.3*  MCV 71.1* 71.2* 72.2*  MCH 21.5* 21.4* 20.9*  MCHC 30.2 30.1 29.0*  RDW 27.3* 27.8* 28.5*  PLT 259 292 310     BNPNo results for input(s): BNP, PROBNP in the last 168 hours.   DDimer No results for input(s): DDIMER in the last 168 hours.   Radiology    No results found.  Cardiac Studies   2D echo 07/11/20  1. Distal septal and apical akinesis with overall moderate-severe LV  dysfunction; apical thrombus noted using definity.  2. Left ventricular ejection fraction, by estimation, is 30 to 35%. The  left ventricle has moderate to severely decreased function. The left  ventricle demonstrates regional wall motion abnormalities (see scoring  diagram/findings for description). The  left ventricular internal cavity size was mildly dilated. Left ventricular  diastolic parameters are consistent with Grade I diastolic dysfunction  (impaired relaxation).  3. Right ventricular systolic function is moderately reduced. The right  ventricular size is mildly enlarged. There is moderately elevated  pulmonary artery systolic pressure.  4. Right atrial size was mildly dilated.  5. A small pericardial effusion is present.  6. The mitral valve is normal in structure. Mild mitral valve  regurgitation. No evidence of mitral stenosis.  7. Tricuspid valve regurgitation is severe.  8. The aortic valve is tricuspid. Aortic valve regurgitation is not  visualized. Mild aortic valve sclerosis is present, with no evidence of  aortic valve stenosis.  9. The inferior vena cava is dilated in size with >50% respiratory  variability, suggesting right atrial pressure of 8 mmHg.   Patient Profile     49 y.o. male experiencing homelessness with history of CAD with MI 2007 tx with LAD and Cx DES, patent stents 2013 with depressed EF, bipolar disorder, DM, dyslipidemia, anxiety, PTSD, critical limb ischemia 02/2020 with nearly occlusive thrombosis within bilateral femoral arteries felt due to apical thrombus with worsening LVEF at that time, PAF on event monitor 03/2020. At that time he'd undergone urgent bilateral  iliofemoral thrombectomy and left lower extremity fasciotomy7/10.Unfortunately, revascularization and return of neurological function on the left was not achieved, so he returned to the OR that admission for successful recanalization of left popliteal artery, posterior tibial artery, and closure of the LLE fasciotomy.  This admission, he was admitted 07/09/20 for PV angriogram with occluded Rt popliteal artery and an 80% Lt popliteal artery.  On 07/10/2020 he had Rt Fem-Pop and Lt popliteal PTA. Cardiology asked to see for hypotension and junctional bradycardia in the context of post-op ABL anemia with Hgb 6.7 requiring blood transfusion. On 12/5 also noted to have short runs of atrial tach.  Assessment & Plan    1. PAD - s/p right popliteal to posterior tibial bypass graft and left popliteal angioplasty  - remains on heparin in context of some bloody drainage from thigh incisions - VVS anticipates transition to Eliquis tomorrow if this is stable, also on ASA and Plavix - await input from vascular surgery about their recs for duration of triple therapy  2. Junctional bradycardia/hypotension - occurred in postop setting of anemia, hypotension and recent anesthesia with high dose home BB on board - HR improved off carvedilol, now on Toprol for short runs of atrial tach and tolerating well - SBP remains somewhat soft  3. Chronic systolic CHF - 2D echo this admission with LVEF 30-35% with distal septal and apical akinesis, moderately enlarged and hypokinetic RV - per outpatient notes, Dr. Antoine Poche had originally planned to echo in Jan and if EF still low plans to pursue ischemic workup. EF confirmed low this admission. He is not describing any current unstable sx and can likely f/u as OP to further discuss (will confirm with MD) - BP remains soft on Toprol/Entresto - will discuss diuretic plan with MD - if felt to need Lasix would probably hold Entresto until BPs are better  4. H/o apical  thrombus - as outlined above, on heparin per pharmacy currently - apixaban is off label for this use but given homelessness this was likely decided best option for use in the past - also had event monitor in 03/2020 with PAF and brief runs NSVT up to 4 beats  5. CAD s/p prior MI, PCI - no current ischemic sx - continue ASA, BB, statin as above. LDL 45  6. Post op ABL anemia - Hgb remaining mid 8's and stable - pt denies any s/sx of GIB  7. Social situation - denies any difficulty obtaining meds - PT recommending SNF  For questions or updates, please contact [facility] Please consult [email or web address] for contact info under Cardiology/STEMI.  Signed, Laurann Montana, PA-C 07/15/2020, 12:21 PM     Attending Note:   The patient was seen and examined.  Agree with assessment and plan as noted above.  Changes made to the above note as needed.  Patient seen and independently examined with  Ronie Spies, PA .   We discussed all aspects of the encounter. I agree with the assessment and plan as stated above.  1.  CHF :   EF 30-35%.  Has apical akinesis with eividence of LV thrombus. This appears to have possibly embolized  Cont meds.  Cont eliquis.   I agree with temporary holding of Eliquis if he requires lasix Will need long term Eliquis -   2. LV thrombus: I agree with DOAC given his social situation   3.  CAD : no angina.      I have spent a total of 40 minutes with patient reviewing hospital  notes , telemetry, EKGs, labs and examining patient as well as establishing an assessment and plan that was discussed with the patient. > 50% of time was spent in direct patient care.    Vesta Mixer, Montez Hageman., MD, [facility] 07/15/2020, 2:42 PM [address]  [address] Office [phone] Pager [phone]

## 2020-07-15 NOTE — [place] FL2 (Signed)
[place] MEDICAID FL2 LEVEL OF CARE SCREENING TOOL     IDENTIFICATION  Patient Name: Paul [name]. Birthdate: 09-Jan-1971 Sex: male Admission Date (Current Location): 07/09/2020  [facility] and [place] Number:  Producer, television/film/video and Address:  [facility], [address], [place], [place] [address]      Provider Number: [id number]  Attending Physician Name and Address:  Nada Libman, MD  Relative Name and Phone Number:       Current Level of Care: Hospital Recommended Level of Care: Skilled Nursing Facility Prior Approval Number:    Date Approved/Denied:   PASRR Number: [id number]  Discharge Plan: SNF    Current Diagnoses: Patient Active Problem List   Diagnosis Date Noted  . Junctional bradycardia   . Ischemic rest pain of lower extremity 06/27/2020  . PAD (peripheral artery disease) (HCC) 06/27/2020  . Homelessness 05/22/2020  . Anticoagulated 03/12/2020  . Chronic anemia 03/12/2020  . Systolic congestive heart failure (HCC)   . Renal mass   . Critical lower limb ischemia (HCC) 02/17/2020  . Neck mass 07/14/2019  . Hypotension 02/20/2019  . PTSD (post-traumatic stress disorder) 12/01/2018  . Cardiomyopathy (HCC) 01/14/2018  . Diabetic neuropathy (HCC) 01/10/2018  . Hypertension   . CAD (coronary artery disease)   . Hyperlipidemia LDL goal <100   . Stented coronary artery 04/30/2011  . Atopic dermatitis 08/19/2010  . Anxiety state 09/27/2009  . Diabetes mellitus type 2, uncontrolled (HCC) 09/19/2009    Orientation RESPIRATION BLADDER Height & Weight     Self, Time, Situation, Place  Normal Continent Weight: 190 lb 4.1 oz (86.3 kg) Height:  5\' 11"  (180.3 cm)  BEHAVIORAL SYMPTOMS/MOOD NEUROLOGICAL BOWEL NUTRITION STATUS      Continent Diet (please see discharge summary)  AMBULATORY STATUS COMMUNICATION OF NEEDS Skin     Verbally Surgical wounds (closed incision RT leg, closed incision RT groin)                        Personal Care Assistance Level of Assistance  Bathing, Feeding, Dressing Bathing Assistance: Limited assistance Feeding assistance: Independent Dressing Assistance: Limited assistance     Functional Limitations Info  Sight, Hearing, Speech Sight Info: Adequate Hearing Info: Adequate Speech Info: Adequate    SPECIAL CARE FACTORS FREQUENCY  PT (By licensed PT), OT (By licensed OT)     PT Frequency: 5x per week OT Frequency: 5x per week            Contractures Contractures Info: Not present    Additional Factors Info  Code Status, Allergies Code Status Info: FULL Allergies Info: Shrimp           Current Medications (07/15/2020):  This is the current hospital active medication list Current Facility-Administered Medications  Medication Dose Route Frequency Provider Last Rate Last Admin  . 0.9 %  sodium chloride infusion   Intravenous Continuous 14/01/2020 L, NP   Stopped at 07/12/20 1230  . acetaminophen (TYLENOL) tablet 325-650 mg  325-650 mg Oral Q4H PRN 14/03/21, PA-C       Or  . acetaminophen (TYLENOL) suppository 325-650 mg  325-650 mg Rectal Q4H PRN Lars Mage, PA-C      . alum & mag hydroxide-simeth (MAALOX/MYLANTA) 200-200-20 MG/5ML suspension 15-30 mL  15-30 mL Oral Q2H PRN 12-01-1974 M, PA-C      . ARIPiprazole (ABILIFY) tablet 5 mg  5 mg Oral Daily M, PA-C  5 mg at 07/15/20 0946  . aspirin EC tablet 81 mg  81 mg Oral Daily Clinton Gallant M, PA-C   81 mg at 07/15/20 7322  . atorvastatin (LIPITOR) tablet 40 mg  40 mg Oral QHS Clinton Gallant M, PA-C   40 mg at 07/14/20 2158  . bisacodyl (DULCOLAX) EC tablet 5 mg  5 mg Oral Daily PRN Lars Mage, PA-C      . Chlorhexidine Gluconate Cloth 2 % PADS 6 each  6 each Topical Daily Nada Libman, MD   6 each at 07/15/20 1018  . citalopram (CELEXA) tablet 20 mg  20 mg Oral BID Clinton Gallant M, PA-C   20 mg at 07/15/20 0254  . docusate sodium (COLACE) capsule 100 mg  100 mg Oral  Daily Clinton Gallant M, PA-C   100 mg at 07/15/20 2706  . ferrous sulfate tablet 325 mg  325 mg Oral Q breakfast Lars Mage, [place]   325 mg at 07/15/20 0946  . guaiFENesin-dextromethorphan (ROBITUSSIN DM) 100-10 MG/5ML syrup 15 mL  15 mL Oral Q4H PRN Clinton Gallant M, PA-C      . heparin ADULT infusion 100 units/mL (25000 units/233mL sodium chloride 0.45%)  1,350 Units/hr Intravenous Continuous Hammons, Kimberly B, RPH 13.5 mL/hr at 07/13/20 1553 1,350 Units/hr at 07/13/20 1553  . hydrALAZINE (APRESOLINE) injection 5 mg  5 mg Intravenous Q20 Min PRN Clinton Gallant M, PA-C      . insulin aspart (novoLOG) injection 0-9 Units  0-9 Units Subcutaneous TID WC Lars Mage, PA-C   2 Units at 07/15/20 1143  . magnesium sulfate IVPB 2 g 50 mL  2 g Intravenous Daily PRN Clinton Gallant M, PA-C      . metoprolol succinate (TOPROL-XL) 24 hr tablet 25 mg  25 mg Oral Daily Barrett, Rhonda G, PA-C   25 mg at 07/15/20 0948  . metoprolol tartrate (LOPRESSOR) injection 2-5 mg  2-5 mg Intravenous Q2H PRN Clinton Gallant M, PA-C      . multivitamin with minerals tablet 1 tablet  1 tablet Oral Daily Lars Mage, PA-C   1 tablet at 07/15/20 0947  . nitroGLYCERIN (NITROSTAT) SL tablet 0.4 mg  0.4 mg Sublingual Q5 min PRN Lars Mage, PA-C      . ondansetron [facility]) injection 4 mg  4 mg Intravenous Q6H PRN Clinton Gallant M, PA-C   4 mg at 07/11/20 2139  . oxyCODONE-acetaminophen (PERCOCET/ROXICET) 5-325 MG per tablet 1-2 tablet  1-2 tablet Oral Q4H PRN Lars Mage, PA-C   2 tablet at 07/15/20 0540  . pantoprazole (PROTONIX) EC tablet 40 mg  40 mg Oral Daily Clinton Gallant M, PA-C   40 mg at 07/15/20 0947  . phenol (CHLORASEPTIC) mouth spray 1 spray  1 spray Mouth/Throat PRN Clinton Gallant M, PA-C      . potassium chloride SA (KLOR-CON) CR tablet 20-40 mEq  20-40 mEq Oral Daily PRN Clinton Gallant M, PA-C      . sacubitril-valsartan (ENTRESTO) 24-26 mg per tablet  1 tablet Oral BID [name], MD   1  tablet at 07/15/20 0948  . senna-docusate (Senokot-S) tablet 1 tablet  1 tablet Oral QHS PRN Clinton Gallant M, PA-C      . sodium phosphate (FLEET) 7-19 GM/118ML enema 1 enema  1 enema Rectal Once PRN Clinton Gallant M, PA-C      . traZODone (DESYREL) tablet 50 mg  50 mg Oral QHS Clinton Gallant M, PA-C   50  mg at 07/14/20 2158     Discharge Medications: Please see discharge summary for a list of discharge medications.  Relevant Imaging Results:  Relevant Lab Results:   Additional Information SSN [id number]  Eduard Roux, LCSWA

## 2020-07-15 NOTE — TOC Progression Note (Signed)
Transition of Care (TOC) - Progression Note  Sander Radon, BSN Transitions of Care Unit 4E- RN Case Manager See Treatment Team for direct phone #    Patient Details  Name: Paul [name]. MRN: [id number] Date of Birth: 08-Jul-1971  Transition of Care [facility]) CM/SW Contact  Zenda Alpers, Lenn Sink, RN Phone Number: 07/15/2020, 2:11 PM  Clinical Narrative:    Received msg this am that pt's ex-wife Paul [name] had called with concerns and was requesting a return call. This Clinical research associate spoke with pt at bedside to discuss transition plan and recommendations for STSNF- per pt he was living out of his car prior to admit- he reports that he has somewhere to go and stay post discharge however it's only for a week- he also reports that his ex-wife Paul [name] has been helping him with housing- looking for "hud" housing- informed pt she had called and asked if he would like [place] to speak with her to assist him- he responded he did not want [place] to call her or speak with her regarding his discharge plans. Patient did indicate he was appreciative of her assistance however he wanted to make his own decisions. Discussed further with pt about housing - pt reports he is on SSI-disability, discussed many housing options are on a wait list. Discussed SNF option with pt- which he states "is a good idea" pt is agreeable to SNF search. Will have CSW f/u with pt for placement needs.    Expected Discharge Plan: OP Rehab Barriers to Discharge: Continued Medical Work up  Expected Discharge Plan and Services Expected Discharge Plan: OP Rehab In-house Referral: [facility] Discharge Planning Services: CM Consult   Living arrangements for the past 2 months: Homeless                 DME Arranged: N/A DME Agency: NA       HH Arranged: NA HH Agency: NA         Social Determinants of Health (SDOH) Interventions    Readmission Risk Interventions No flowsheet data found.

## 2020-07-15 NOTE — Progress Notes (Addendum)
  Progress Note    07/15/2020 8:34 AM 5 Days Post-Op  Subjective:  Resting comfortably in bed. States some bleeding from right thigh after he got up to use bathroom this morning   Vitals:   07/15/20 0328 07/15/20 0731  BP: 114/80 99/72  Pulse: 68 (!) 58  Resp: 14 18  Temp: 98.7 F (37.1 C) 98 F (36.7 C)  SpO2: 98% 90%   Physical Exam: Cardiac: regular rate and rhythm Lungs: non labored Incisions:  Right proximal thigh incision separation superficial. Pink granulation tissue visible in wound. Serosanguinous drainage on gauze. Distal medial right thigh incision with bloody drainage on dressings from between nylon sutures. No active bleeding observed. Dry dressings reapplied. RLE dressings c/d/i Extremities:  Bilateral lower extremities well perfused and warm. Brisk bilateral PT doppler signals Neurologic: alert and oriented  CBC    Component Value Date/Time   WBC 8.1 07/15/2020 0320   RBC 4.06 (L) 07/15/2020 0320   HGB 8.5 (L) 07/15/2020 0320   HGB 9.1 (L) 06/28/2020 0946   HCT 29.3 (L) 07/15/2020 0320   HCT 33.4 (L) 06/28/2020 0946   PLT 310 07/15/2020 0320   PLT 335 06/28/2020 0946   MCV 72.2 (L) 07/15/2020 0320   MCV 68 (L) 06/28/2020 0946   MCH 20.9 (L) 07/15/2020 0320   MCHC 29.0 (L) 07/15/2020 0320   RDW 28.5 (H) 07/15/2020 0320   RDW 21.1 (H) 06/28/2020 0946   LYMPHSABS 1.3 06/28/2020 0946   MONOABS 0.6 02/16/2020 2044   EOSABS 0.0 06/28/2020 0946   BASOSABS 0.0 06/28/2020 0946    BMET    Component Value Date/Time   NA 133 (L) 07/15/2020 0320   NA 136 06/28/2020 0946   K 4.3 07/15/2020 0320   CL 102 07/15/2020 0320   CO2 23 07/15/2020 0320   GLUCOSE 176 (H) 07/15/2020 0320   BUN 11 07/15/2020 0320   BUN 9 06/28/2020 0946   CREATININE 0.98 07/15/2020 0320   CALCIUM 8.1 (L) 07/15/2020 0320   GFRNONAA >60 07/15/2020 0320   GFRAA 110 06/28/2020 0946    INR    Component Value Date/Time   INR 1.1 (H) 08/24/2011 1002     Intake/Output Summary  (Last 24 hours) at 07/15/2020 0835 Last data filed at 07/15/2020 0400 Gross per 24 hour  Intake 728.58 ml  Output 240 ml  Net 488.58 ml     Assessment/Plan:  49 y.o. male is s/p right popliteal to posterior tibial bypass graft and left popliteal angioplasty 5 Days Post-Op. BLE well perfused and warm with Brisk PT signals bilaterally. RLE medial thigh incision dehiscence with serosanguinous drainage and some bloody drainage from distal medial thigh incisions between nylon sutures. Sutures intact. Dry dressings reapplied. Distal right leg dressings c/d/i. Appreciate cardiology input. Had some runs of atrial tachycardia yesterday. BB restarted. Remains on Heparin gtt. Hopefully if no further bleeding issues will transition to Eliquis tomorrow. Also will start Aspirin and Plavix. Oob and mobilize as tolerated. Otherwise hemodynamically stable. TOC working on dispo  DVT prophylaxis:  Heparin gtt   Graceann Congress, PA-C [facility] [phone] 07/15/2020 8:35 AM

## 2020-07-15 NOTE — Progress Notes (Signed)
Occupational Therapy Treatment Patient Details Name: Paul [name]. MRN: [id number] DOB: 03-16-1971 Today's Date: 07/15/2020    History of present illness 49 yo admitted 11/30 with right popliteal occlusion. Pt s/p bil LE thrombectomy several months ago. 12/1 Rt fem pop BPG and Left PTA with post op bradycardia and hypotension with transfer to ICU. PMhx: PTSD, MI, HTN, DM, bipolar, homelessness, HLD, CAD, PAD   OT comments  Patient continues to make progress towards goals in skilled OT session. Patient's session encompassed introduction to AE with education and demonstration provided. Pt noted to be more flat in session, and requiring increased time to process all questions and cues. Pt wanting to sit at EOB to eat breakfast and able to complete with supervision, however required significant time to motor plan scooting hips to EOB to remain upright. Due to delays in cognition, further education with AE will be addressed next session. Discharge remains appropriate, therapy will continue to follow.   Follow Up Recommendations  Supervision - Intermittent HHOT    Equipment Recommendations  Other (comment)    Recommendations for Other Services      Precautions / Restrictions Precautions Precautions: Fall Precaution Comments: watch RLE bandages for bleeding Restrictions Weight Bearing Restrictions: Yes RLE Weight Bearing: Weight bearing as tolerated LLE Weight Bearing: Weight bearing as tolerated       Mobility Bed Mobility Overal bed mobility: Needs Assistance Bed Mobility: Sidelying to Sit   Sidelying to sit: Supervision Supine to sit: Min guard     General bed mobility comments: min guard for safety, vc for scooting hips forward once EoB  Transfers Overall transfer level: Needs assistance Equipment used: 4-wheeled walker Transfers: Sit to/from [employer] Sit to Stand: Min guard         General transfer comment: pt very, slow and deliberate with  good form and hand placement on bed to push off, min guard for safety, with coming to upright pt stated "Now that was a 10"    Balance Overall balance assessment: Needs assistance Sitting-balance support: No upper extremity supported;Feet supported Sitting balance-Leahy Scale: Good     Standing balance support: Bilateral upper extremity supported Standing balance-Leahy Scale: Poor Standing balance comment: reliant on external support and assist with initial posterior bias                           ADL either performed or assessed with clinical judgement   ADL Overall ADL's : Needs assistance/impaired     Grooming: Set up;Sitting                               Functional mobility during ADLs: Minimal assistance General ADL Comments: Min A to EOB, provided reacher and long handled sponge and demonstrated in session. Pt requiring extra time to answer all prompts due to just waking up so would benefit from further education with regard to AE     Vision       Perception     Praxis      Cognition Arousal/Alertness: Awake/alert Behavior During Therapy: [name] for tasks assessed/performed Overall Cognitive Status: No family/caregiver present to determine baseline cognitive functioning                                 General Comments: continues to have slower processing, and repietition  Exercises Exercises: General Lower Extremity General Exercises - Lower Extremity Ankle Circles/Pumps: AROM;Both;10 reps;Seated Quad Sets: AROM;Both;10 reps;Seated Long Arc Quad: AROM;Seated;15 reps;Both Heel Slides: AROM;10 reps;Right (with washcloth) Hip Flexion/Marching: AROM;15 reps;Seated;Both   Shoulder Instructions       General Comments VSS on RA, dressing at thigh with clear yellow drainage and slight bleeding at superior aspect of R thigh incision     Pertinent Vitals/ Pain       Pain Assessment: Faces Faces Pain Scale: Hurts a little  bit Pain Location: discomfort in RLE Pain Descriptors / Indicators: Discomfort;Grimacing Pain Intervention(s): Limited activity within patient's tolerance;Monitored during session;Repositioned  Home Living                                          Prior Functioning/Environment              Frequency  Min 2X/week        Progress Toward Goals  OT Goals(current goals can now be found in the care plan section)  Progress towards OT goals: Progressing toward goals  Acute Rehab OT Goals Patient Stated Goal: be able to move OT Goal Formulation: With patient Time For Goal Achievement: 07/26/20 Potential to Achieve Goals: Good  Plan Discharge plan remains appropriate    Co-evaluation                 AM-PAC OT "6 Clicks" Daily Activity     Outcome Measure   Help from another person eating meals?: None Help from another person taking care of personal grooming?: A Little Help from another person toileting, which includes using toliet, bedpan, or urinal?: A Lot Help from another person bathing (including washing, rinsing, drying)?: A Lot Help from another person to put on and taking off regular upper body clothing?: A Little Help from another person to put on and taking off regular lower body clothing?: A Lot 6 Click Score: 16    End of Session Equipment Utilized During Treatment: Other (comment) (AE provided)  OT Visit Diagnosis: Other abnormalities of gait and mobility (R26.89);Muscle weakness (generalized) (M62.81);Pain Pain - Right/Left: Right Pain - part of body: Leg   Activity Tolerance Patient tolerated treatment well   Patient Left in bed;with call bell/phone within reach;with nursing/sitter in room   Nurse Communication Mobility status        Time: 8101-7510 OT Time Calculation (min): 11 min  Charges: OT General Charges $OT Visit: 1 Visit OT Treatments $Self Care/Home Management : 8-22 mins  Pollyann Glen E. Kale Dols, COTA/L Acute  Rehabilitation Services [phone]   Cherlyn Cushing 07/15/2020, 12:32 PM

## 2020-07-15 NOTE — Progress Notes (Signed)
ANTICOAGULATION CONSULT NOTE  Pharmacy Consult for Heparin Indication: PVD with hx DVT  Allergies  Allergen Reactions  . Shrimp [Shellfish Allergy] Swelling    tongue    Patient Measurements: Height: 5\' 11"  (180.3 cm) Weight: 86.3 kg (190 lb 4.1 oz) IBW/kg (Calculated) : 75.3  Vital Signs: Temp: 98 F (36.7 C) (12/06 0731) Temp Source: Oral (12/06 0731) BP: 99/72 (12/06 0731) Pulse Rate: 58 (12/06 0731)  Labs: Recent Labs    07/13/20 0652 07/13/20 0652 07/13/20 1440 07/14/20 0207 07/15/20 0320  HGB 8.4*   < >  --  8.1* 8.5*  HCT 27.8*  --   --  26.9* 29.3*  PLT 259  --   --  292 310  HEPARINUNFRC 0.44   < > 0.62 0.46 0.57  CREATININE 1.13  --   --  1.04 0.98   < > = values in this interval not displayed.    Estimated Creatinine Clearance: 97.1 mL/min (by C-G formula based on SCr of 0.98 mg/dL).  Assessment: 49 y.o. male with h/o PAD and DVT on Eliquis PTA.  Eliquis, ASA, and Plavix held for fem-pop bypass 12/1.  Hgb stabilized post-op s/p 2 PRBC and heparin was started 12/3.  Heparin level therapeutic on 1350 units/hr. No overt bleeding or complications noted.  Hgb low but stable, Pltc ok.  Goal of Therapy:  Heparin level 0.3-0.7 units/ml Monitor platelets by anticoagulation protocol: Yes   Plan:  Continue IV heparin at 1350 units/hr Heparin level and CBC daily while on heparin. F/u ability to resume Eliquis prior to discharge.  14/3, Reece Leader, BCCP Clinical Pharmacist  07/15/2020 8:38 AM   [facility] pharmacy phone numbers are listed on [email or web address]

## 2020-07-15 NOTE — Progress Notes (Signed)
Mobility Specialist - Progress Note   07/15/20 1308  Mobility  Activity Ambulated in hall  Level of Assistance Contact guard assist, steadying assist  Assistive Device Four wheel walker  Distance Ambulated (ft) 80 ft  Mobility Response Tolerated well  Mobility performed by Mobility specialist  $Mobility charge 1 Mobility    Pre-mobility: 66 HR During mobility: 78 HR Post-mobility: 65 HR  Bleeding noted on posterior side of RLE, RN made aware. Pt back in chair after walk.   Mamie Levers Mobility Specialist Mobility Specialist Phone: [phone]

## 2020-07-15 NOTE — Progress Notes (Signed)
Physical Therapy Treatment Patient Details Name: Paul [name]. MRN: [id number] DOB: 1970/10/24 Today's Date: 07/15/2020    History of Present Illness 49 yo admitted 11/30 with right popliteal occlusion. Pt s/p bil LE thrombectomy several months ago. 12/1 Rt fem pop BPG and Left PTA with post op bradycardia and hypotension with transfer to ICU. PMhx: PTSD, MI, HTN, DM, bipolar, homelessness, HLD, CAD, PAD    PT Comments    Pt agreeable to working with therapy stating that moving his R LE "makes it feel better." Pt continues to have slowed processing, ultimately able to complete task with more time. Pt is limited in safe mobility by decreased R LE ROM, decreased strength and decreased endurance. Pt is min guard for bed mobility, transfers and ambulation. Pt completed AROM exercises to progress mobility and strength. PT continues to recommend SNF level rehab at discharge. PT will continue to follow acutely.    Follow Up Recommendations  SNF;Supervision/Assistance - 24 hour     Equipment Recommendations  3in1 (PT)       Precautions / Restrictions Precautions Precautions: Fall Precaution Comments: watch RLE bandages for bleeding Restrictions Weight Bearing Restrictions: Yes RLE Weight Bearing: Weight bearing as tolerated LLE Weight Bearing: Weight bearing as tolerated    Mobility  Bed Mobility Overal bed mobility: Needs Assistance Bed Mobility: Sidelying to Sit   Sidelying to sit: Supervision Supine to sit: Min guard     General bed mobility comments: min guard for safety, vc for scooting hips forward once EoB  Transfers Overall transfer level: Needs assistance Equipment used: 4-wheeled walker Transfers: Sit to/from [employer] Sit to Stand: Min guard         General transfer comment: pt very, slow and deliberate with good form and hand placement on bed to push off, min guard for safety, with coming to upright pt stated "Now that was a  10"  Ambulation/Gait Ambulation/Gait assistance: Min guard Gait Distance (Feet): 90 Feet Assistive device: 4-wheeled walker Gait Pattern/deviations: Step-to pattern;Decreased step length - left;Decreased stance time - right;Decreased dorsiflexion - right Gait velocity: slowed Gait velocity interpretation: <1.8 ft/sec, indicate of risk for recurrent falls General Gait Details: min guard for safety, vc for proximity to Rollator and upright posture, adjusted Rollator handles for better posture       Balance Overall balance assessment: Needs assistance Sitting-balance support: No upper extremity supported;Feet supported Sitting balance-Leahy Scale: Good     Standing balance support: Bilateral upper extremity supported Standing balance-Leahy Scale: Poor Standing balance comment: reliant on external support and assist with initial posterior bias                            Cognition Arousal/Alertness: Awake/alert Behavior During Therapy: WFL for tasks assessed/performed Overall Cognitive Status: No family/caregiver present to determine baseline cognitive functioning                                 General Comments: continues to have slower processing, and repietition       Exercises General Exercises - Lower Extremity Ankle Circles/Pumps: AROM;Both;10 reps;Seated Quad Sets: AROM;Both;10 reps;Seated Long Arc Quad: AROM;Seated;15 reps;Both Heel Slides: AROM;10 reps;Right (with washcloth) Hip Flexion/Marching: AROM;15 reps;Seated;Both    General Comments General comments (skin integrity, edema, etc.): VSS on RA, dressing at thigh with clear yellow drainage and slight bleeding at superior aspect of R thigh incision  Pertinent Vitals/Pain Pain Assessment: Faces Faces Pain Scale: Hurts a little bit Pain Location: discomfort in RLE Pain Descriptors / Indicators: Discomfort;Grimacing Pain Intervention(s): Limited activity within patient's  tolerance;Monitored during session;Repositioned           PT Goals (current goals can now be found in the care plan section) Acute Rehab PT Goals Patient Stated Goal: be able to move PT Goal Formulation: With patient Time For Goal Achievement: 07/26/20 Potential to Achieve Goals: Fair Progress towards PT goals: Progressing toward goals    Frequency    Min 3X/week      PT Plan Current plan remains appropriate       AM-PAC PT "6 Clicks" Mobility   Outcome Measure  Help needed turning from your back to your side while in a flat bed without using bedrails?: A Little Help needed moving from lying on your back to sitting on the side of a flat bed without using bedrails?: A Little Help needed moving to and from a bed to a chair (including a wheelchair)?: A Lot Help needed standing up from a chair using your arms (e.g., wheelchair or bedside chair)?: A Lot Help needed to walk in hospital room?: A Little Help needed climbing 3-5 steps with a railing? : A Lot 6 Click Score: 15    End of Session Equipment Utilized During Treatment: Gait belt Activity Tolerance: Patient tolerated treatment well Patient left: in chair;with call bell/phone within reach;with chair alarm set Nurse Communication: Mobility status PT Visit Diagnosis: Unsteadiness on feet (R26.81);Other abnormalities of gait and mobility (R26.89);Pain     Time: 1025-1051 PT Time Calculation (min) (ACUTE ONLY): 26 min  Charges:  $Gait Training: 8-22 mins $Therapeutic Exercise: 8-22 mins                     Carman Auxier B. Beverely Risen PT, DPT Acute Rehabilitation Services Pager [phone] Office [phone]    Elon Alas Fleet 07/15/2020, 11:30 AM

## 2020-07-15 NOTE — Care Management Important Message (Signed)
Important Message  Patient Details  Name: Paul [name]. MRN: [id number] Date of Birth: 1970/11/02   Medicare Important Message Given:  Yes     Renie Ora 07/15/2020, 10:14 AM

## 2020-07-16 DIAGNOSIS — R001 Bradycardia, unspecified: Secondary | ICD-10-CM | POA: Diagnosis not present

## 2020-07-16 LAB — CBC
HCT: 28.9 % — ABNORMAL LOW (ref 39.0–52.0)
Hemoglobin: 8.3 g/dL — ABNORMAL LOW (ref 13.0–17.0)
MCH: 20.9 pg — ABNORMAL LOW (ref 26.0–34.0)
MCHC: 28.7 g/dL — ABNORMAL LOW (ref 30.0–36.0)
MCV: 72.8 fL — ABNORMAL LOW (ref 80.0–100.0)
Platelets: 282 10*3/uL (ref 150–400)
RBC: 3.97 MIL/uL — ABNORMAL LOW (ref 4.22–5.81)
RDW: 28.4 % — ABNORMAL HIGH (ref 11.5–15.5)
WBC: 6.9 10*3/uL (ref 4.0–10.5)
nRBC: 2.3 % — ABNORMAL HIGH (ref 0.0–0.2)

## 2020-07-16 LAB — GLUCOSE, CAPILLARY
Glucose-Capillary: 97 mg/dL (ref 70–99)
Glucose-Capillary: 163 mg/dL — ABNORMAL HIGH (ref 70–99)
Glucose-Capillary: 149 mg/dL — ABNORMAL HIGH (ref 70–99)
Glucose-Capillary: 129 mg/dL — ABNORMAL HIGH (ref 70–99)

## 2020-07-16 LAB — BASIC METABOLIC PANEL
Anion gap: 8 (ref 5–15)
BUN: 11 mg/dL (ref 6–20)
CO2: 21 mmol/L — ABNORMAL LOW (ref 22–32)
Calcium: 8 mg/dL — ABNORMAL LOW (ref 8.9–10.3)
Chloride: 103 mmol/L (ref 98–111)
Creatinine, Ser: 0.99 mg/dL (ref 0.61–1.24)
GFR, Estimated: >60 mL/min (ref >60)
Glucose, Bld: 142 mg/dL — ABNORMAL HIGH (ref 70–99)
Potassium: 4.6 mmol/L (ref 3.5–5.1)
Sodium: 132 mmol/L — ABNORMAL LOW (ref 135–145)

## 2020-07-16 LAB — HEPARIN LEVEL (UNFRACTIONATED): Heparin Unfractionated: 0.39 IU/mL (ref 0.30–0.70)

## 2020-07-16 NOTE — Progress Notes (Signed)
ANTICOAGULATION CONSULT NOTE  Pharmacy Consult for Heparin Indication: PVD with hx DVT  Allergies  Allergen Reactions  . Shrimp [Shellfish Allergy] Swelling    tongue    Patient Measurements: Height: 5\' 11"  (180.3 cm) Weight: 86.3 kg (190 lb 4.1 oz) IBW/kg (Calculated) : 75.3  Vital Signs: Temp: 97.5 F (36.4 C) (12/07 0803) Temp Source: Oral (12/07 0803) BP: 149/92 (12/07 0803) Pulse Rate: 69 (12/07 0424)  Labs: Recent Labs    07/14/20 0207 07/14/20 0207 07/15/20 0320 07/16/20 0125  HGB 8.1*   < > 8.5* 8.3*  HCT 26.9*  --  29.3* 28.9*  PLT 292  --  310 282  HEPARINUNFRC 0.46  --  0.57 0.39  CREATININE 1.04  --  0.98 0.99   < > = values in this interval not displayed.    Estimated Creatinine Clearance: 96.1 mL/min (by C-G formula based on SCr of 0.99 mg/dL).  Assessment: 49 y.o. male with h/o PAD and DVT on Eliquis PTA.  Eliquis, ASA, and Plavix held for fem-pop bypass 12/1.  Heparin was started 12/3.  Heparin level therapeutic on 1350 units/hr.   Goal of Therapy:  Heparin level 0.3-0.7 units/ml Monitor platelets by anticoagulation protocol: Yes   Plan:  Continue IV heparin at 1350 units/hr Heparin level and CBC daily while on heparin. Plans noted to resume Eliquis prior to discharge.  14/3, PharmD Clinical Pharmacist **Pharmacist phone directory can now be found on [email or web address] ([email or web address]).  Listed under [facility].

## 2020-07-16 NOTE — Progress Notes (Addendum)
  Progress Note    07/16/2020 7:29 AM 6 Days Post-Op  Subjective:  No complaints.  Had dressing change RLE this morning due to ongoing serous drainage from R thigh incision   Vitals:   07/15/20 2331 07/16/20 0424  BP: 125/82 (!) 135/95  Pulse: 65 69  Resp: 13 19  Temp: 97.8 F (36.6 C) 98 F (36.7 C)  SpO2: 98% 99%   Physical Exam Lungs:  Non labored Incisions:  Dressing left in place RLE Extremities:  Brisk PT signals bilaterally Neurologic: A&O  CBC    Component Value Date/Time   WBC 6.9 07/16/2020 0125   RBC 3.97 (L) 07/16/2020 0125   HGB 8.3 (L) 07/16/2020 0125   HGB 9.1 (L) 06/28/2020 0946   HCT 28.9 (L) 07/16/2020 0125   HCT 33.4 (L) 06/28/2020 0946   PLT 282 07/16/2020 0125   PLT 335 06/28/2020 0946   MCV 72.8 (L) 07/16/2020 0125   MCV 68 (L) 06/28/2020 0946   MCH 20.9 (L) 07/16/2020 0125   MCHC 28.7 (L) 07/16/2020 0125   RDW 28.4 (H) 07/16/2020 0125   RDW 21.1 (H) 06/28/2020 0946   LYMPHSABS 1.3 06/28/2020 0946   MONOABS 0.6 02/16/2020 2044   EOSABS 0.0 06/28/2020 0946   BASOSABS 0.0 06/28/2020 0946    BMET    Component Value Date/Time   NA 132 (L) 07/16/2020 0125   NA 136 06/28/2020 0946   K 4.6 07/16/2020 0125   CL 103 07/16/2020 0125   CO2 21 (L) 07/16/2020 0125   GLUCOSE 142 (H) 07/16/2020 0125   BUN 11 07/16/2020 0125   BUN 9 06/28/2020 0946   CREATININE 0.99 07/16/2020 0125   CALCIUM 8.0 (L) 07/16/2020 0125   GFRNONAA >60 07/16/2020 0125   GFRAA 110 06/28/2020 0946    INR    Component Value Date/Time   INR 1.1 (H) 08/24/2011 1002     Intake/Output Summary (Last 24 hours) at 07/16/2020 0729 Last data filed at 07/16/2020 0500 Gross per 24 hour  Intake 837.34 ml  Output 700 ml  Net 137.34 ml     Assessment/Plan:  49 y.o. male is s/p R popliteal to PT bypass and L popliteal angioplasty 6 Days Post-Op   BLE well perfused Continue dry dressing changes BID and prn to RLE [facility] team working on SNF placement Continue aspirin and  plavix; transition from heparin to Eliquis closer to discharge   Emilie Rutter, PA-C [facility] [phone] 07/16/2020 7:29 AM  I have seen and evaluated the patient and agree with the above plan  Durene Cal

## 2020-07-16 NOTE — Progress Notes (Signed)
Pt had copious serosanguinous drainage from right inner thing. The right leg 3+ pitting edema, upper incision was separated. ABD pads were soaking wet, dry dressing changed twice tonight. He's hemodynamically stable. Remained afebrile. NSR on monitor. Ambulated to chair and bathroom with walker and one standby assisted. Pain tolerated well. Able to rest well tonight. Doppler got signals on DP and PT pulse bilaterally. No immediate distress.  We continue to monitor.   Filiberto Pinks, RN

## 2020-07-16 NOTE — Progress Notes (Signed)
Mobility Specialist - Progress Note   07/16/20 1339  Mobility  Activity Ambulated in hall  Level of Assistance Standby assist, set-up cues, supervision of patient - no hands on  Assistive Device Four wheel walker  Distance Ambulated (ft) 80 ft (40 ft x 2)  Mobility Response Tolerated well  Mobility performed by Mobility specialist  $Mobility charge 1 Mobility    Pre-mobility: 72 HR During mobility: 92 HR Post-mobility: 70 HR  Pt required one seated rest break due to RLE weakness. Some drainage noted in dressing while ambulating, RN made aware. Pt sitting in chair after walk.   Mamie Levers Mobility Specialist Mobility Specialist Phone: [phone]

## 2020-07-16 NOTE — TOC Progression Note (Addendum)
Transition of Care (TOC) - Progression Note    Patient Details  Name: Paul [name]. MRN: [id number] Date of Birth: [birth year]  Transition of Care [facility]) CM/SW Contact  Eduard Roux, [place] Phone Number: 07/16/2020, 3:21 PM  Clinical Narrative:     Patient has no bed offers  Sent message to -[place], [facility] to review -waiting on response  Antony Blackbird, MSW, [employer] Clinical Social Worker   Expected Discharge Plan: Skilled Nursing Facility Barriers to Discharge: Continued Medical Work up, SNF Pending bed offer  Expected Discharge Plan and Services Expected Discharge Plan: Skilled Nursing Facility In-house Referral: [facility] Discharge Planning Services: CM Consult   Living arrangements for the past 2 months: Homeless                 DME Arranged: N/A DME Agency: NA       HH Arranged: NA HH Agency: NA         Social Determinants of Health (SDOH) Interventions    Readmission Risk Interventions No flowsheet data found.

## 2020-07-16 NOTE — Progress Notes (Signed)
Progress Note  Patient Name: Paul [name]. Date of Encounter: 07/16/2020  Primary Cardiologist: Rollene Rotunda, MD  Subjective   Reports social stressors at home with ex wife and children. Has a friend that might be able to help him out after discharge. Has been homeless and living in his car.  Denies any CP or SOB. No dizziness or palpitations. Feeling some fluid retention.   Inpatient Medications    Scheduled Meds: . ARIPiprazole  5 mg Oral Daily  . aspirin EC  81 mg Oral Daily  . atorvastatin  40 mg Oral QHS  . Chlorhexidine Gluconate Cloth  6 each Topical Daily  . citalopram  20 mg Oral BID  . docusate sodium  100 mg Oral Daily  . ferrous sulfate  325 mg Oral Q breakfast  . insulin aspart  0-9 Units Subcutaneous TID WC  . metoprolol succinate  25 mg Oral Daily  . multivitamin with minerals  1 tablet Oral Daily  . pantoprazole  40 mg Oral Daily  . sacubitril-valsartan  1 tablet Oral BID  . traZODone  50 mg Oral QHS   Continuous Infusions: . sodium chloride Stopped (07/12/20 1230)  . heparin 1,350 Units/hr (07/15/20 2007)  . magnesium sulfate bolus IVPB     PRN Meds: acetaminophen **OR** acetaminophen, alum & mag hydroxide-simeth, bisacodyl, guaiFENesin-dextromethorphan, hydrALAZINE, magnesium sulfate bolus IVPB, metoprolol tartrate, nitroGLYCERIN, ondansetron, oxyCODONE-acetaminophen, phenol, potassium chloride, senna-docusate, sodium phosphate   Vital Signs    Vitals:   07/15/20 2331 07/16/20 0424 07/16/20 0803 07/16/20 0853  BP: 125/82 (!) 135/95 (!) 149/92 125/88  Pulse: 65 69    Resp: 13 19 17 15   Temp: 97.8 F (36.6 C) 98 F (36.7 C) (!) 97.5 F (36.4 C) (!) 97.5 F (36.4 C)  TempSrc: Oral Oral Oral Oral  SpO2: 98% 99% 100% 100%  Weight:      Height:        Intake/Output Summary (Last 24 hours) at 07/16/2020 1058 Last data filed at 07/16/2020 0500 Gross per 24 hour  Intake 837.34 ml  Output 700 ml  Net 137.34 ml   Last 3 Weights  07/12/2020 07/11/2020 07/10/2020  Weight (lbs) 190 lb 4.1 oz 177 lb 4 oz 165 lb  Weight (kg) 86.3 kg 80.4 kg 74.844 kg     Telemetry     - Personally Reviewed   Physical Exam   Physical Exam: Blood pressure 125/88, pulse 69, temperature (!) 97.5 F (36.4 C), temperature source Oral, resp. rate 15, height 5\' 11"  (1.803 m), weight 86.3 kg, SpO2 100 %.  GEN:  Well nourished, well developed in no acute distress HEENT: Normal NECK: No JVD; No carotid bruits LYMPHATICS: No lymphadenopathy CARDIAC: RRR , no murmurs, rubs, gallops RESPIRATORY:  Clear to auscultation without rales, wheezing or rhonchi  ABDOMEN: Soft, non-tender, non-distended MUSCULOSKELETAL:  Right leg is bandaged.  Some evidence of oozing from wounds  SKIN: Warm and dry NEUROLOGIC:  Alert and oriented x 3   Labs    High Sensitivity Troponin:   Recent Labs  Lab 07/11/20 0849  TROPONINIHS 19*      Cardiac EnzymesNo results for input(s): TROPONINI in the last 168 hours. No results for input(s): TROPIPOC in the last 168 hours.   Chemistry Recent Labs  Lab 07/11/20 0849 07/12/20 0703 07/14/20 0207 07/15/20 0320 07/16/20 0125  NA 133*   < > 129* 133* 132*  K 5.2*   < > 4.6 4.3 4.6  CL 105   < > 100 102  103  CO2 16*   < > 22 23 21*  GLUCOSE 267*   < > 161* 176* 142*  BUN 16   < > 15 11 11   CREATININE 1.25*   < > 1.04 0.98 0.99  CALCIUM 8.1*   < > 8.0* 8.1* 8.0*  PROT 6.6  --   --   --   --   ALBUMIN 2.4*  --   --   --   --   AST 27  --   --   --   --   ALT 17  --   --   --   --   ALKPHOS 55  --   --   --   --   BILITOT 1.6*  --   --   --   --   GFRNONAA >60   < > >60 >60 >60  ANIONGAP 12   < > 7 8 8    < > = values in this interval not displayed.     Hematology Recent Labs  Lab 07/14/20 0207 07/15/20 0320 07/16/20 0125  WBC 10.0 8.1 6.9  RBC 3.78* 4.06* 3.97*  HGB 8.1* 8.5* 8.3*  HCT 26.9* 29.3* 28.9*  MCV 71.2* 72.2* 72.8*  MCH 21.4* 20.9* 20.9*  MCHC 30.1 29.0* 28.7*  RDW 27.8* 28.5*  28.4*  PLT 292 310 282    BNPNo results for input(s): BNP, PROBNP in the last 168 hours.   DDimer No results for input(s): DDIMER in the last 168 hours.   Radiology    No results found.  Cardiac Studies   2D echo 07/11/20  1. Distal septal and apical akinesis with overall moderate-severe LV  dysfunction; apical thrombus noted using definity.  2. Left ventricular ejection fraction, by estimation, is 30 to 35%. The  left ventricle has moderate to severely decreased function. The left  ventricle demonstrates regional wall motion abnormalities (see scoring  diagram/findings for description). The  left ventricular internal cavity size was mildly dilated. Left ventricular  diastolic parameters are consistent with Grade I diastolic dysfunction  (impaired relaxation).  3. Right ventricular systolic function is moderately reduced. The right  ventricular size is mildly enlarged. There is moderately elevated  pulmonary artery systolic pressure.  4. Right atrial size was mildly dilated.  5. A small pericardial effusion is present.  6. The mitral valve is normal in structure. Mild mitral valve  regurgitation. No evidence of mitral stenosis.  7. Tricuspid valve regurgitation is severe.  8. The aortic valve is tricuspid. Aortic valve regurgitation is not  visualized. Mild aortic valve sclerosis is present, with no evidence of  aortic valve stenosis.  9. The inferior vena cava is dilated in size with >50% respiratory  variability, suggesting right atrial pressure of 8 mmHg.   Patient Profile     49 y.o. male experiencing homelessness with history of CAD with MI 2007 tx with LAD and Cx DES, patent stents 2013 with depressed EF, bipolar disorder, DM, dyslipidemia, anxiety, PTSD, critical limb ischemia 02/2020 with nearly occlusive thrombosis within bilateral femoral arteries felt due to apical thrombus with worsening LVEF at that time, PAF on event monitor 03/2020. At that time he'd  undergone urgent bilateral iliofemoral thrombectomy and left lower extremity fasciotomy7/10.Unfortunately, revascularization and return of neurological function on the left was not achieved, so he returned to the OR that admission for successful recanalization of left popliteal artery, posterior tibial artery, and closure of the LLE fasciotomy.  This admission, he was admitted 07/09/20 for  PV angriogram with occluded Rt popliteal artery and an 80% Lt popliteal artery. On 07/10/2020 he had Rt Fem-Pop and Lt popliteal PTA. Cardiology asked to see for hypotension and junctional bradycardia in the context of post-op ABL anemia with Hgb 6.7 requiring blood transfusion. On 12/5 also noted to have short runs of atrial tach.  Assessment & Plan    1. PAD - s/p right popliteal to posterior tibial bypass graft and left popliteal angioplasty  - remains on heparin in context of some bloody drainage from thigh incisions - VVS anticipates transition to Eliquis today   2. Junctional bradycardia/hypotension  HR and BP have stabilized  3. Chronic systolic CHF - 2D echo this admission with LVEF 30-35% with distal septal and apical akinesis, moderately enlarged and hypokinetic RV Currently on Entresto  Metoprolol has been stopped due to bradycardia after surgery . He may tolerate restarting once he is over this surgery .   4. H/o apical thrombus  anticipate changing heparin to Eliquis once his oozing from his leg surgery has improved.   5. CAD s/p prior MI, PCI - no current ischemic sx - continue ASA, BB, statin as above. LDL 45  6. Post op ABL anemia - Hgb remaining mid 8's and stable - pt denies any s/sx of GIB  7. Social situation - denies any difficulty obtaining meds - PT recommending SNF     Kristeen Miss, MD  07/16/2020 11:05 AM    [facility] [address] [place],  [address] [place], [place]  [address] Phone: [phone]; Fax: [phone]

## 2020-07-16 NOTE — TOC Progression Note (Signed)
Transition of Care (TOC) - Progression Note  Sander Radon, BSN Transitions of Care Unit 4E- RN Case Manager See Treatment Team for direct phone #    Patient Details  Name: Paul [name]. MRN: [id number] Date of Birth: 1971/03/20  [facility]) CM/SW Contact  Zenda Alpers, Lenn Sink, RN Phone Number: 07/16/2020, 4:32 PM  Clinical Narrative:    Received another call from pt's ex wife Paul [name] - she wanted to give update that she had sent in application for housing in [place] electronically with pt permission to have pt added to wait list for housing in [place] since the [employer] had informed her that they are not excepting applications for any one under the age 12 (only senior applications at this time). She also states that she emailed Annalee Genta at [facility]. Paul [name] is acting on the best interest of the patient and is in contact with the patient to assist him with housing needs.  This Clinical research associate again spoke with pt regarding his ex-wife calling and asked again if he wanted to give permission to speak with Paul [name]- pt voices that yes Paul [name] can be primary contact- reviewed contacts with pt and moved Paul [name] to be primary contact- and his most recent ex wife he states will be last contact-Paul [name]. Pt voices that Paul [name] has been very helpful to him and he is appreciative- he voices that it is ok to speak with her when she calls.    Expected Discharge Plan: Skilled Nursing Facility Barriers to Discharge: Continued Medical Work up, SNF Pending bed offer  Expected Discharge Plan and Services Expected Discharge Plan: Skilled Nursing Facility In-house Referral: [facility] Discharge Planning Services: CM Consult   Living arrangements for the past 2 months: Homeless                 DME Arranged: N/A DME Agency: NA       HH Arranged: NA HH Agency: NA         Social Determinants of Health (SDOH) Interventions    Readmission Risk Interventions No  flowsheet data found.

## 2020-07-17 ENCOUNTER — Ambulatory Visit: Payer: Medicare Other | Admitting: Cardiology

## 2020-07-17 DIAGNOSIS — I5042 Chronic combined systolic (congestive) and diastolic (congestive) heart failure: Secondary | ICD-10-CM | POA: Diagnosis not present

## 2020-07-17 LAB — CBC
HCT: 27.2 % — ABNORMAL LOW (ref 39.0–52.0)
Hemoglobin: 8.2 g/dL — ABNORMAL LOW (ref 13.0–17.0)
MCH: 21.5 pg — ABNORMAL LOW (ref 26.0–34.0)
MCHC: 30.1 g/dL (ref 30.0–36.0)
MCV: 71.2 fL — ABNORMAL LOW (ref 80.0–100.0)
Platelets: 317 10*3/uL (ref 150–400)
RBC: 3.82 MIL/uL — ABNORMAL LOW (ref 4.22–5.81)
RDW: 28.4 % — ABNORMAL HIGH (ref 11.5–15.5)
WBC: 7 10*3/uL (ref 4.0–10.5)
nRBC: 1.3 % — ABNORMAL HIGH (ref 0.0–0.2)

## 2020-07-17 LAB — GLUCOSE, CAPILLARY
Glucose-Capillary: 147 mg/dL — ABNORMAL HIGH (ref 70–99)
Glucose-Capillary: 197 mg/dL — ABNORMAL HIGH (ref 70–99)
Glucose-Capillary: 128 mg/dL — ABNORMAL HIGH (ref 70–99)
Glucose-Capillary: 153 mg/dL — ABNORMAL HIGH (ref 70–99)

## 2020-07-17 LAB — SARS CORONAVIRUS 2 BY RT PCR (HOSPITAL ORDER, PERFORMED IN ~~LOC~~ HOSPITAL LAB): SARS Coronavirus 2: NEGATIVE

## 2020-07-17 LAB — HEPARIN LEVEL (UNFRACTIONATED): Heparin Unfractionated: 0.23 IU/mL — ABNORMAL LOW (ref 0.30–0.70)

## 2020-07-17 MED ORDER — CLOPIDOGREL BISULFATE 75 MG PO TABS
75.0000 mg | ORAL_TABLET | Freq: Every day | ORAL | Status: DC
  Administered 2020-07-17 – 2020-07-19 (×3): 75 mg via ORAL
  Filled 2020-07-17 (×3): qty 1

## 2020-07-17 NOTE — Progress Notes (Addendum)
  Progress Note    07/17/2020 7:40 AM 7 Days Post-Op  Subjective:  No complaints.  Rest pain BLE resolved   Vitals:   07/17/20 0340 07/17/20 0734  BP: 109/74 126/82  Pulse: 73 73  Resp: 12 14  Temp: 98 F (36.7 C) (!) 97.5 F (36.4 C)  SpO2: 98% 92%   Physical Exam: Lungs:  Non labored Incisions:  Open proximal R saphenectomy incision with serous drainage Extremities:  Brisk PT signals bilaterally Abdomen:  soft Neurologic: A&O  CBC    Component Value Date/Time   WBC 7.0 07/17/2020 0212   RBC 3.82 (L) 07/17/2020 0212   HGB 8.2 (L) 07/17/2020 0212   HGB 9.1 (L) 06/28/2020 0946   HCT 27.2 (L) 07/17/2020 0212   HCT 33.4 (L) 06/28/2020 0946   PLT 317 07/17/2020 0212   PLT 335 06/28/2020 0946   MCV 71.2 (L) 07/17/2020 0212   MCV 68 (L) 06/28/2020 0946   MCH 21.5 (L) 07/17/2020 0212   MCHC 30.1 07/17/2020 0212   RDW 28.4 (H) 07/17/2020 0212   RDW 21.1 (H) 06/28/2020 0946   LYMPHSABS 1.3 06/28/2020 0946   MONOABS 0.6 02/16/2020 2044   EOSABS 0.0 06/28/2020 0946   BASOSABS 0.0 06/28/2020 0946    BMET    Component Value Date/Time   NA 132 (L) 07/16/2020 0125   NA 136 06/28/2020 0946   K 4.6 07/16/2020 0125   CL 103 07/16/2020 0125   CO2 21 (L) 07/16/2020 0125   GLUCOSE 142 (H) 07/16/2020 0125   BUN 11 07/16/2020 0125   BUN 9 06/28/2020 0946   CREATININE 0.99 07/16/2020 0125   CALCIUM 8.0 (L) 07/16/2020 0125   GFRNONAA >60 07/16/2020 0125   GFRAA 110 06/28/2020 0946    INR    Component Value Date/Time   INR 1.1 (H) 08/24/2011 1002     Intake/Output Summary (Last 24 hours) at 07/17/2020 0740 Last data filed at 07/17/2020 0300 Gross per 24 hour  Intake 487.97 ml  Output 400 ml  Net 87.97 ml     Assessment/Plan:  49 y.o. male is s/p R AK popliteal to PTA bypass with vein and L popliteal angioplasty 7 Days Post-Op   BLE well perfused RLE proximal most incision still with copious drainage; consider prevena vac; continue dry dressing changes for  now ASA and plavix; will transition heparin to Eliquis closer to discharge SNF placement pending; continue work with therapy team   Emilie Rutter, PA-C [facility] [phone] 07/17/2020 7:40 AM

## 2020-07-17 NOTE — Consult Note (Signed)
WOC Nurse Consult Note: Patient receiving care in [facility] 4N19. Reason for Consult: VAC therapy to Right Lower Extremity, upper most area of surgical wound only Wound type: dehisced surgical wound Pressure Injury POA: Yes/No/NA Measurement: 4.5 cm x 1 cm x 1.6 cm Wound bed: 100% pink Drainage (amount, consistency, odor) heavy serosanginous Periwound: intact and protected by narrow strips of drape Dressing procedure/placement/frequency: one piece of black foam tucked into wound bed. Drape applied. Immediate seal obtained. Patient tolerated very well without premedication. Supplies requested for Friday. Helmut Muster, RN, MSN, CWOCN, CNS-BC, pager [phone]

## 2020-07-17 NOTE — Progress Notes (Signed)
CSW met with patient at bedside. CSW was referred by Dr Trula Slade to assist with housing as patient is homeless. Patient will go to a SNF facility post discharge for short term rehab but will have no where to go post SNF. CSW completed the VI-SPDAT to have patient evaluated by partners ending homelessness for housing options. CSW provided patient with contact info for post hospitalization follow up and assistance with housing. CSW available as needed.Raquel Sarna, [place], [place]

## 2020-07-17 NOTE — Progress Notes (Signed)
PT Cancellation Note  Patient Details Name: Paul [name]. MRN: [id number] DOB: [birth year]   Cancelled Treatment:    Reason Eval/Treat Not Completed: (P) Patient at procedure or test/unavailable Pt working with Child psychotherapist on housing. PT will follow back for treatment tomorrow.  Orla Estrin B. Beverely Risen PT, DPT Acute Rehabilitation Services Pager [phone] Office [phone]    Elon Alas [facility] 07/17/2020, 4:37 PM

## 2020-07-17 NOTE — TOC Progression Note (Signed)
Transition of Care (TOC) - Progression Note    Patient Details  Name: Paul [name]. MRN: [id number] Date of Birth: Oct 31, 1970  Transition of Care [facility]) CM/SW [place], [place] Phone Number: 07/17/2020, 5:15 PM  Clinical Narrative:     CSW met with patient at bedside. Patient accepted bed offer with [facility]. CSW provided shelter/tranistional housing resources. Patient informed CSW he can temporaily stay with his friend Dentist after rehab at [facility] .   CSW informed [place] patient has accepted bed offer and they will need to order wound vac.  Thurmond Butts, MSW, [place] Clinical Social Worker    Expected Discharge Plan: Skilled Nursing Facility Barriers to Discharge: Continued Medical Work up, SNF Pending bed offer  Expected Discharge Plan and Services Expected Discharge Plan: [place] In-house Referral: [facility] Discharge Planning Services: CM Consult   Living arrangements for the past 2 months: Homeless                 DME Arranged: N/A DME Agency: NA       HH Arranged: NA HH Agency: NA         Social Determinants of Health (SDOH) Interventions Food Insecurity Interventions: Other (Comment) (denied for food stamps) Financial Strain Interventions: Other (Comment) Housing Interventions: Other (Comment) (VI SPDAT completed and sent to partners ending homelessness) Transportation Interventions: Intervention Not Indicated  Readmission Risk Interventions No flowsheet data found.

## 2020-07-17 NOTE — Plan of Care (Signed)

## 2020-07-17 NOTE — Progress Notes (Signed)
ANTICOAGULATION CONSULT NOTE  Pharmacy Consult for Heparin Indication: PVD with hx DVT  Allergies  Allergen Reactions  . Shrimp [Shellfish Allergy] Swelling    tongue    Patient Measurements: Height: 5\' 11"  (180.3 cm) Weight: 86.3 kg (190 lb 4.1 oz) IBW/kg (Calculated) : 75.3  Vital Signs: Temp: 98 F (36.7 C) (12/08 0340) Temp Source: Oral (12/08 0340) BP: 109/74 (12/08 0340) Pulse Rate: 73 (12/08 0340)  Labs: Recent Labs    07/15/20 0320 07/15/20 0320 07/16/20 0125 07/17/20 0212  HGB 8.5*   < > 8.3* 8.2*  HCT 29.3*  --  28.9* 27.2*  PLT 310  --  282 317  HEPARINUNFRC 0.57  --  0.39 0.23*  CREATININE 0.98  --  0.99  --    < > = values in this interval not displayed.    Estimated Creatinine Clearance: 96.1 mL/min (by C-G formula based on SCr of 0.99 mg/dL).  Assessment: 49 y.o. male with h/o PAD and DVT on Eliquis PTA.  Eliquis, ASA, and Plavix held for fem-pop bypass 12/1.  Heparin was started 12/3. Some bloody drainage from thigh incisions noted 12/7 -Heparin level subtherapeutic on 1350 units/hr; Per RN, heparin "paused" for at least 45 min last night  Goal of Therapy:  Heparin level 0.3-0.7 units/ml Monitor platelets by anticoagulation protocol: Yes   Plan:  -Continue IV heparin at 1350 units/hr (he has been at goal on this rate for the last 4 days) -Heparin level and CBC daily while on heparin. -Plans noted to resume Eliquis prior to discharge.  14/7, PharmD Clinical Pharmacist **Pharmacist phone directory can now be found on [email or web address] ([email or web address]).  Listed under [facility].

## 2020-07-17 NOTE — Progress Notes (Signed)
Progress Note  Patient Name: Paul [name]. Date of Encounter: 07/17/2020  Primary Cardiologist: Rollene Rotunda, MD  Subjective   Reports social stressors at home with ex wife and children. Has a friend that might be able to help him out after discharge. Has been homeless and living in his car.  Denies any CP or SOB. No dizziness or palpitations. Feeling some fluid retention. Sleeping this afternoon   Inpatient Medications    Scheduled Meds: . ARIPiprazole  5 mg Oral Daily  . aspirin EC  81 mg Oral Daily  . atorvastatin  40 mg Oral QHS  . Chlorhexidine Gluconate Cloth  6 each Topical Daily  . citalopram  20 mg Oral BID  . clopidogrel  75 mg Oral Daily  . docusate sodium  100 mg Oral Daily  . ferrous sulfate  325 mg Oral Q breakfast  . insulin aspart  0-9 Units Subcutaneous TID WC  . metoprolol succinate  25 mg Oral Daily  . multivitamin with minerals  1 tablet Oral Daily  . pantoprazole  40 mg Oral Daily  . sacubitril-valsartan  1 tablet Oral BID  . traZODone  50 mg Oral QHS   Continuous Infusions: . sodium chloride Stopped (07/12/20 1230)  . heparin 1,350 Units/hr (07/17/20 1205)  . magnesium sulfate bolus IVPB     PRN Meds: acetaminophen **OR** acetaminophen, alum & mag hydroxide-simeth, bisacodyl, guaiFENesin-dextromethorphan, hydrALAZINE, magnesium sulfate bolus IVPB, metoprolol tartrate, nitroGLYCERIN, ondansetron, oxyCODONE-acetaminophen, phenol, potassium chloride, senna-docusate, sodium phosphate   Vital Signs    Vitals:   07/17/20 0012 07/17/20 0340 07/17/20 0734 07/17/20 1205  BP: 126/86 109/74 126/82 126/86  Pulse: 74 73 73   Resp: 17 12 14 14   Temp: 97.8 F (36.6 C) 98 F (36.7 C) (!) 97.5 F (36.4 C) (!) 97.4 F (36.3 C)  TempSrc: Oral Oral Oral Oral  SpO2: 95% 98% 92% 91%  Weight:      Height:        Intake/Output Summary (Last 24 hours) at 07/17/2020 1455 Last data filed at 07/17/2020 0900 Gross per 24 hour  Intake 727.97 ml  Output  400 ml  Net 327.97 ml   Last 3 Weights 07/12/2020 07/11/2020 07/10/2020  Weight (lbs) 190 lb 4.1 oz 177 lb 4 oz 165 lb  Weight (kg) 86.3 kg 80.4 kg 74.844 kg     Telemetry     sinus rhythm - Personally Reviewed   Physical Exam   Physical Exam: Blood pressure 126/86, pulse 73, temperature (!) 97.4 F (36.3 C), temperature source Oral, resp. rate 14, height 5\' 11"  (1.803 m), weight 86.3 kg, SpO2 91 %.  GEN:  Middle age man,  nad  HEENT: Normal NECK: No JVD; No carotid bruits LYMPHATICS: No lymphadenopathy CARDIAC: RRR  RESPIRATORY:  Clear to auscultation without rales, wheezing or rhonchi  ABDOMEN: Soft, non-tender, non-distended MUSCULOSKELETAL:   Right leg wounds still oozing   SKIN: Warm and dry NEUROLOGIC:  Alert and oriented x 3    Labs    High Sensitivity Troponin:   Recent Labs  Lab 07/11/20 0849  TROPONINIHS 19*      Cardiac EnzymesNo results for input(s): TROPONINI in the last 168 hours. No results for input(s): TROPIPOC in the last 168 hours.   Chemistry Recent Labs  Lab 07/11/20 0849 07/12/20 0703 07/14/20 0207 07/15/20 0320 07/16/20 0125  NA 133*   < > 129* 133* 132*  K 5.2*   < > 4.6 4.3 4.6  CL 105   < >  100 102 103  CO2 16*   < > 22 23 21*  GLUCOSE 267*   < > 161* 176* 142*  BUN 16   < > 15 11 11   CREATININE 1.25*   < > 1.04 0.98 0.99  CALCIUM 8.1*   < > 8.0* 8.1* 8.0*  PROT 6.6  --   --   --   --   ALBUMIN 2.4*  --   --   --   --   AST 27  --   --   --   --   ALT 17  --   --   --   --   ALKPHOS 55  --   --   --   --   BILITOT 1.6*  --   --   --   --   GFRNONAA >60   < > >60 >60 >60  ANIONGAP 12   < > 7 8 8    < > = values in this interval not displayed.     Hematology Recent Labs  Lab 07/15/20 0320 07/16/20 0125 07/17/20 0212  WBC 8.1 6.9 7.0  RBC 4.06* 3.97* 3.82*  HGB 8.5* 8.3* 8.2*  HCT 29.3* 28.9* 27.2*  MCV 72.2* 72.8* 71.2*  MCH 20.9* 20.9* 21.5*  MCHC 29.0* 28.7* 30.1  RDW 28.5* 28.4* 28.4*  PLT 310 282 317     BNPNo results for input(s): BNP, PROBNP in the last 168 hours.   DDimer No results for input(s): DDIMER in the last 168 hours.   Radiology    No results found.  Cardiac Studies   2D echo 07/11/20  1. Distal septal and apical akinesis with overall moderate-severe LV  dysfunction; apical thrombus noted using definity.  2. Left ventricular ejection fraction, by estimation, is 30 to 35%. The  left ventricle has moderate to severely decreased function. The left  ventricle demonstrates regional wall motion abnormalities (see scoring  diagram/findings for description). The  left ventricular internal cavity size was mildly dilated. Left ventricular  diastolic parameters are consistent with Grade I diastolic dysfunction  (impaired relaxation).  3. Right ventricular systolic function is moderately reduced. The right  ventricular size is mildly enlarged. There is moderately elevated  pulmonary artery systolic pressure.  4. Right atrial size was mildly dilated.  5. A small pericardial effusion is present.  6. The mitral valve is normal in structure. Mild mitral valve  regurgitation. No evidence of mitral stenosis.  7. Tricuspid valve regurgitation is severe.  8. The aortic valve is tricuspid. Aortic valve regurgitation is not  visualized. Mild aortic valve sclerosis is present, with no evidence of  aortic valve stenosis.  9. The inferior vena cava is dilated in size with >50% respiratory  variability, suggesting right atrial pressure of 8 mmHg.   Patient Profile     49 y.o. male experiencing homelessness with history of CAD with MI 2007 tx with LAD and Cx DES, patent stents 2013 with depressed EF, bipolar disorder, DM, dyslipidemia, anxiety, PTSD, critical limb ischemia 02/2020 with nearly occlusive thrombosis within bilateral femoral arteries felt due to apical thrombus with worsening LVEF at that time, PAF on event monitor 03/2020. At that time he'd undergone urgent bilateral  iliofemoral thrombectomy and left lower extremity fasciotomy7/10.Unfortunately, revascularization and return of neurological function on the left was not achieved, so he returned to the OR that admission for successful recanalization of left popliteal artery, posterior tibial artery, and closure of the LLE fasciotomy.  This admission, he was admitted  07/09/20 for PV angriogram with occluded Rt popliteal artery and an 80% Lt popliteal artery. On 07/10/2020 he had Rt Fem-Pop and Lt popliteal PTA. Cardiology asked to see for hypotension and junctional bradycardia in the context of post-op ABL anemia with Hgb 6.7 requiring blood transfusion. On 12/5 also noted to have short runs of atrial tach.  Assessment & Plan    1. PAD - s/p right popliteal to posterior tibial bypass graft and left popliteal angioplasty  Still on heparin ,  The plan is to transition to eliquis closer to his DC     2. Junctional bradycardia/hypotension Is tolerating toprol XL 25 mg daily   3. Chronic systolic CHF - 2D echo this admission with LVEF 30-35% with distal septal and apical akinesis, moderately enlarged and hypokinetic RV Is back on entresto, toprol xl  .   4. H/o apical thrombus  anticipate changing heparin to Eliquis once his oozing from his leg surgery has improved.   5. CAD s/p prior MI, PCI - no current ischemic sx - continue ASA, BB, statin as above. LDL 45  6. Post op ABL anemia - Hgb remaining mid 8's and stable - pt denies any s/sx of GIB  7. Social situation - denies any difficulty obtaining meds - PT recommending SNF  [facility] will sign off.   Medication Recommendations:  Restart eliquis prior to DC  Other recommendations (labs, testing, etc):   Follow up as an outpatient:  With Dr. Antoine Poche or APP in several weeks.     Kristeen Miss, MD  07/17/2020 2:59 PM    [facility] [address] [place],  [address] [place], [place]  [address] Phone: [phone]; Fax:  [phone]

## 2020-07-17 NOTE — Progress Notes (Signed)
At bedside shift report pt was in bathroom, when went to see pt at 2010 , pt's pump was beeping where heparin gtts was running because it has been paused. When asked pt, stated " I paused it", when asked why pt stated " I wasn't going to use bathroom with it, its not even working, its just there". Reeducated pt that he is not supposed to touch the pump, and he actually could end of hurting himself. Educated that ,  its a continuous medication and cannot be paused until its ordered. Pt needed repetitive explanation but voiced understanding, locked the pump . Heparin gtts running at 13.51ml/hr. will continue to monitor.

## 2020-07-18 LAB — GLUCOSE, CAPILLARY
Glucose-Capillary: 151 mg/dL — ABNORMAL HIGH (ref 70–99)
Glucose-Capillary: 126 mg/dL — ABNORMAL HIGH (ref 70–99)
Glucose-Capillary: 120 mg/dL — ABNORMAL HIGH (ref 70–99)
Glucose-Capillary: 157 mg/dL — ABNORMAL HIGH (ref 70–99)

## 2020-07-18 LAB — CBC
HCT: 29.9 % — ABNORMAL LOW (ref 39.0–52.0)
Hemoglobin: 8.6 g/dL — ABNORMAL LOW (ref 13.0–17.0)
MCH: 20.8 pg — ABNORMAL LOW (ref 26.0–34.0)
MCHC: 28.8 g/dL — ABNORMAL LOW (ref 30.0–36.0)
MCV: 72.4 fL — ABNORMAL LOW (ref 80.0–100.0)
Platelets: 300 10*3/uL (ref 150–400)
RBC: 4.13 MIL/uL — ABNORMAL LOW (ref 4.22–5.81)
RDW: 28.5 % — ABNORMAL HIGH (ref 11.5–15.5)
WBC: 6.9 10*3/uL (ref 4.0–10.5)
nRBC: 1 % — ABNORMAL HIGH (ref 0.0–0.2)

## 2020-07-18 LAB — HEPARIN LEVEL (UNFRACTIONATED): Heparin Unfractionated: 0.27 IU/mL — ABNORMAL LOW (ref 0.30–0.70)

## 2020-07-18 MED ORDER — OXYCODONE-ACETAMINOPHEN 5-325 MG PO TABS
1.0000 | ORAL_TABLET | ORAL | 0 refills | Status: DC | PRN
Start: 2020-07-18 — End: 2020-09-03

## 2020-07-18 MED ORDER — APIXABAN 5 MG PO TABS
5.0000 mg | ORAL_TABLET | Freq: Two times a day (BID) | ORAL | Status: DC
  Administered 2020-07-18 – 2020-07-19 (×2): 5 mg via ORAL
  Filled 2020-07-18 (×2): qty 1

## 2020-07-18 MED ORDER — METOPROLOL SUCCINATE ER 25 MG PO TB24: 25.0000 mg | ORAL_TABLET | Freq: Every day | ORAL | 2 refills | Status: AC

## 2020-07-18 NOTE — Progress Notes (Signed)
CSW met with patient and provided contact information for post discharge needs. Patient confirmed plans for discharge to SNF. CSW will follow up with patient post discharge for continued housing needs. Patient verbalizes understanding and has CSW contact information. Raquel Sarna, [place], [place]

## 2020-07-18 NOTE — Discharge Summary (Addendum)
Bypass Discharge Summary Patient ID: Paul [name] [id number] 49 y.o. 06-23-1971  Admit date: 07/09/2020  Discharge date and time: 07/19/2020  Admitting Physician: Serafina Mitchell, MD   Discharge Physician: Serafina Mitchell, MD  Admission Diagnoses: PAD (peripheral artery disease) [facility]) [I73.9] Critical lower limb ischemia [facility]) [I70.229]  Discharge Diagnoses: PAD, critical limb ischemia  Admission Condition: stable  Discharged Condition: good  Indication for Admission: status post bilateral lower extremity thrombectomy for acute ischemia several months ago.  He aslo had a left popliteal stent.  He presented to ED with bilateral rest pain  Hospital Course: 49 year old male who presented with bilateral rest pain. He was admitted he underwent angiography on 11/30 which showed right popliteal occlusion and right popliteal stenosis. He subsequently was scheduled for right lower extremity bypass. On 12/1 he underwent a right above knee popliteal to posterior tibial artery bypass graft with non reversed ipsilateral translocated saphenous vein and drug coated balloon angioplasty of his left posterior tibial artery by Dr. Trula Slade. He tolerated the procedure well and was transferred to PACU.  POD#1 he had issues with hypotension and bradycardia. He was found to be anemic with Hgb of 6.7. he received 2 units of PRBCs.  He remained hypotensive following transfusion so he was transferred to ICU. Troponins were ordered and were found to be elevated so cardiology was consulted. His bypass graft otherwise remained patent. Bilateral lower extremities with Doppler PT signals  POD#2 patient remained stable. Echo was ordered. EKG showing junctional rhythm with bradycardia. Hgb responded appropriately following transfusion of PRBCs. Cardiology recommended holding Entresto, Coreg and Lasix. Lower extremities with adequate perfusion  POD#3  Lower extremities well perfused with brisk doppler PT  bilaterally. Blood pressure remained soft. Some improvement following transfusion. Hemoglobin 8.1 this morning. Which is close to his baseline with chronic anemia. Bradycardia resolved. In NSR. Cardiology recommended holding nodal agents.  On Asa, Plavix and Eliquis due to recent intervention and history of apical thrombus. Tolerating diet. Good urine output. Able to start working with PT/ OT. Restarted on IV heparin. Patient transferred out of ICU  POD#4 rest pain symptoms resolved. Having some drainage and slight skin separation at saphenectomy site. Bilateral lower extremities warm and well perfused with PD doppler signals. Recommend continued mobilization. Hemodynamically stable.   POD#5 BLE well perfused and warm with Brisk PT signals bilaterally. RLE medial thigh incision dehiscence with serosanguinous drainage and some bloody drainage from distal medial thigh incisions between nylon sutures. Sutures intact.  Had some runs of atrial tachycardia. Started on Metoprolol XL per Cardiolgy. On Heparin. Otherwise hemodynamically stable. TOC working on dispo  POD#6 through POD#8 consisted of continued adequate perfusion of lower extremities. Improved pain control. Mobilization and working with PT/OT. Wound VAC applied to right proximal thigh saphenectomy incision due to increased wound separation and serous drainage. Heart rate well controlled with Metoprolol. Cardiology signed off. Transition of care was able to find patient bed at [facility] and have arranged assistance for somewhere to go after SNF. Patient transitioned from Heparin IV to Eliquis. He will continue Aspirin, Statin and Plavix. He will continue triple therapy for 1 month. He otherwise remained stable for discharge to SNF. He has follow up arranged with Dr. Trula Slade in 4-6 with duplex of his bypass graft and ABIs  Consults: cardiology and pulmonary/intensive care  Treatments: cardiac meds: metoprolol, anticoagulation: heparin and surgery:  #1: Right above-knee popliteal to posterior tibial artery bypass graft with  nonreversed ipsilateral translocated saphenous vein #2: Ultrasound-guided access, right femoral artery #3: Drug-coated balloon angioplasty, left posterior tibial artery   Disposition: Discharge disposition: 03-Skilled Nursing Facility     SNF  - For [facility] use ---  Post-op:  Wound infection: No  Graft infection: No  Transfusion: Yes  If yes, 2 units given New Arrhythmia: Yes, atrial fibrillation Patency judged by: [ X] Dopper only, [ ] Palpable graft pulse, [ ] Palpable distal pulse, [ ] ABI inc. > 0.15, [ ] Duplex D/C Ambulatory Status: Ambulatory with Assistance  Complications: MI: Valu.Nieves ] No, [ ] Troponin only, [ ] EKG or Clinical CHF: No Resp failure: Valu.Nieves ] none, [ ] Pneumonia, [ ] Ventilator Chg in renal function: Valu.Nieves ] none, [ ] Inc. Cr > 0.5, [ ] Temp. Dialysis, [ ] Permanent dialysis Stroke: Valu.Nieves ] None, [ ] Minor, [ ] Major Return to OR: No  Reason for return to OR: [ ] Bleeding, [ ] Infection, [ ] Thrombosis, [ ] Revision  Discharge medications: Statin use:  Yes ASA use:  Yes Plavix use:  Yes Beta blocker use: Yes Coumadin use: No  for medical reason on Eliquis    Patient Instructions:  Allergies as of 07/19/2020      Reactions   Shrimp [shellfish Allergy] Swelling   tongue      Medication List    STOP taking these medications   carvedilol 25 MG tablet Commonly known as: COREG     TAKE these medications   apixaban 5 MG Tabs tablet Commonly known as: ELIQUIS Take 1 tablet (5 mg total) by mouth 2 (two) times daily.   ARIPiprazole 5 MG tablet Commonly known as: ABILIFY Take 1 tablet (5 mg total) by mouth daily.   aspirin 81 MG EC tablet Take 1 tablet (81 mg total) by mouth daily. What changed:   when to take this  additional instructions   atorvastatin 40 MG tablet Commonly known as:  LIPITOR Take 1 tablet (40 mg total) by mouth at bedtime.   citalopram 20 MG tablet Commonly known as: CELEXA Take 1 tablet (20 mg total) by mouth 2 (two) times daily.   clopidogrel 75 MG tablet Commonly known as: PLAVIX Take 1 tablet (75 mg total) by mouth daily.   Continuous Glucose Monitor Kit 1 kit by Does not apply route daily.   empagliflozin 25 MG Tabs tablet Commonly known as: JARDIANCE Take 25 mg by mouth daily.   ferrous sulfate 325 (65 FE) MG tablet Take 325 mg by mouth daily with breakfast.   furosemide 40 MG tablet Commonly known as: Lasix Take 1 tablet (40 mg total) by mouth daily.   glucose blood test strip Commonly known as: ONE TOUCH ULTRA TEST Check sugar once daily in the morning. ICD10 E11.65   insulin glargine 100 UNIT/ML Solostar Pen Commonly known as: LANTUS Inject 20 Units into the skin every morning. What changed:   how much to take  when to take this   metFORMIN 1000 MG tablet Commonly known as: GLUCOPHAGE Take 1,000 mg by mouth 2 (two) times daily.   metoprolol succinate 25 MG 24 hr tablet Commonly known as: TOPROL-XL Take 1 tablet (25 mg total) by mouth daily.   multivitamin with minerals tablet Take 1 tablet by mouth daily.   nitroGLYCERIN 0.4 MG SL tablet Commonly known as: NITROSTAT Place 1 tablet (0.4 mg total) under the tongue every 5 (five) minutes as needed for chest pain.   ONE TOUCH LANCETS  Misc Use to check sugar once daily. ICD10 E11.65   ONE TOUCH ULTRA 2 w/Device Kit Check sugar once daily. ICD10 E11.65   oxyCODONE-acetaminophen 5-325 MG tablet Commonly known as: Percocet Take 1 tablet by mouth every 4 (four) hours as needed for severe pain.   sacubitril-valsartan 24-26 MG Commonly known as: ENTRESTO Take 1 tablet by mouth 2 (two) times daily.   spironolactone 25 MG tablet Commonly known as: ALDACTONE Take 0.5 tablets (12.5 mg total) by mouth daily.   traZODone 50 MG tablet Commonly known as: DESYREL Take  50 mg by mouth at bedtime.   triamcinolone 0.1 % Commonly known as: KENALOG Apply 1 application topically 2 (two) times daily.      Activity: Activity as tolerated, no heavy lifting, pulling, pushing for 6 weeks Diet: regular diet and diabetic diet Wound Care: keep wound clean and dry and Wound VAC management to right thigh  Follow-up with Dr. Trula Slade in 4-6 weeks   Signed: Karoline Caldwell 07/19/2020 11:26 AM

## 2020-07-18 NOTE — Discharge Instructions (Signed)
[facility]  Discharge instructions  Lower Extremity Bypass Surgery  Please refer to the following instruction for your post-procedure care. Your surgeon or physician assistant will discuss any changes with you.  Activity  You are encouraged to walk as much as you can. You can slowly return to normal activities during the month after your surgery. Avoid strenuous activity and heavy lifting until your doctor tells you it's OK. Avoid activities such as vacuuming or swinging a golf club. Do not drive until your doctor give the OK and you are no longer taking prescription pain medications. It is also normal to have difficulty with sleep habits, eating and bowel movement after surgery. These will go away with time.  Bathing/Showering  Shower daily after you go home. Do not soak in a bathtub, hot tub, or swim until the incision heals completely.  Incision Care  Clean your incision with mild soap and water. Shower every day. Pat the area dry with a clean towel. You do not need a bandage unless otherwise instructed. Do not apply any ointments or creams to your incision. If you have open wounds you will be instructed how to care for them or a visiting nurse may be arranged for you. If you have staples or sutures along your incision they will be removed at your post-op appointment. You may have skin glue on your incision. Do not peel it off. It will come off on its own in about one week.  Wash the groin wound with soap and water daily and pat dry. (No tub bath-only shower)  Then put a dry gauze or washcloth in the groin to keep this area dry to help prevent wound infection.  Do this daily and as needed.  Do not use Vaseline or neosporin on your incisions.  Only use soap and water on your incisions and then protect and keep dry.  Diet  Resume your normal diet. There are no special food restrictions following this procedure. A low fat/ low cholesterol diet is  recommended for all patients with vascular disease. In order to heal from your surgery, it is CRITICAL to get adequate nutrition. Your body requires vitamins, minerals, and protein. Vegetables are the best source of vitamins and minerals. Vegetables also provide the perfect balance of protein. Processed food has little nutritional value, so try to avoid this.  Medications  Resume taking all your medications unless your doctor or physician assistant tells you not to. If your incision is causing pain, you may take over-the-counter pain relievers such as acetaminophen (Tylenol). If you were prescribed a stronger pain medication, please aware these medication can cause nausea and constipation. Prevent nausea by taking the medication with a snack or meal. Avoid constipation by drinking plenty of fluids and eating foods with high amount of fiber, such as fruits, vegetables, and grains. Take Colace 100 mg (an over-the-counter stool softener) twice a day as needed for constipation.   Do not take Tylenol if you are taking prescription pain medications.  Follow Up  Our office will schedule a follow up appointment 2-3 weeks following discharge.  Please call us immediately for any of the following conditions  .Severe or worsening pain in your legs or feet while at rest or while walking .Increase pain, redness, warmth, or drainage (pus) from your incision site(s) . Fever of 101 degree or higher . The swelling in your leg with the bypass suddenly worsens and becomes more painful than when you were in the hospital .  If you have been instructed to feel your graft pulse then you should do so every day. If you can no longer feel this pulse, call the office immediately. Not all patients are given this instruction. .  Leg swelling is common after leg bypass surgery.  The swelling should improve over a few months following surgery. To improve the swelling, you may elevate your legs above the level of your heart  while you are sitting or resting. Your surgeon or physician assistant may ask you to apply an ACE wrap or wear compression (TED) stockings to help to reduce swelling.  Reduce your risk of vascular disease  Stop smoking. If you would like help call [facility] at 1-800-QUIT-NOW ((862) 303-4507) or [place] at (470)567-4167.  . Manage your cholesterol . Maintain a desired weight . Control your diabetes weight . Control your diabetes . Keep your blood pressure down .  If you have any questions, please call the office at [phone]    Information on my medicine - ELIQUIS (apixaban)  Why was Eliquis prescribed for you? Eliquis was prescribed for you to reduce the risk of forming blood clots.  What do You need to know about Eliquis ? Take your Eliquis TWICE DAILY - one tablet in the morning and one tablet in the evening with or without food.  It would be best to take the doses about the same time each day.  If you have difficulty swallowing the tablet whole please discuss with your pharmacist how to take the medication safely.  Take Eliquis exactly as prescribed by your doctor and DO NOT stop taking Eliquis without talking to the doctor who prescribed the medication.  Stopping may increase your risk of developing a new clot or stroke.  Refill your prescription before you run out.  After discharge, you should have regular check-up appointments with your healthcare provider that is prescribing your Eliquis.  In the future your dose may need to be changed if your kidney function or weight changes by a significant amount or as you get older.  What do you do if you miss a dose? If you miss a dose, take it as soon as you remember on the same day and resume taking twice daily.  Do not take more than one dose of ELIQUIS at the same time.  Important Safety Information A possible side effect of Eliquis is bleeding. You should call your healthcare provider right away if you experience  any of the following: ? Bleeding from an injury or your nose that does not stop. ? Unusual colored urine (red or dark brown) or unusual colored stools (red or black). ? Unusual bruising for unknown reasons. ? A serious fall or if you hit your head (even if there is no bleeding).  Some medicines may interact with Eliquis and might increase your risk of bleeding or clotting while on Eliquis. To help avoid this, consult your healthcare provider or pharmacist prior to using any new prescription or non-prescription medications, including herbals, vitamins, non-steroidal anti-inflammatory drugs (NSAIDs) and supplements.  This website has more information on Eliquis (apixaban): [email or web address].com.cy.

## 2020-07-18 NOTE — Progress Notes (Signed)
Occupational Therapy Treatment Patient Details Name: Paul [name]. MRN: [id number] DOB: August 23, 1970 Today's Date: 07/18/2020    History of present illness 49 yo admitted 11/30 with right popliteal occlusion. Pt s/p bil LE thrombectomy several months ago. 12/1 Rt fem pop BPG and Left PTA with post op bradycardia and hypotension with transfer to ICU. PMhx: PTSD, MI, HTN, DM, bipolar, homelessness, HLD, CAD, PAD   OT comments  Pt making progress with functional goals. Pt seated I recliner upon arrival and eager to work with therapy. OT will continue to follow acutely to maximize level of function and safety  Follow Up Recommendations  SNF    Equipment Recommendations  Other (comment) (TBD at next venue of care)    Recommendations for Other Services      Precautions / Restrictions Precautions Precautions: Fall Precaution Comments: watch RLE bandages for bleeding Restrictions Weight Bearing Restrictions: Yes RLE Weight Bearing: Weight bearing as tolerated       Mobility Bed Mobility               General bed mobility comments: pt in recliner upon arrival  Transfers Overall transfer level: Needs assistance Equipment used: 4-wheeled walker Transfers: Sit to/from Stand Sit to Stand: Min guard Stand pivot transfers: Min guard       General transfer comment: pt very, slow and deliberate with good form and hand placement on bed to push off, min guard for safety, poor eccentric load returning to seated surface.    Balance Overall balance assessment: Needs assistance Sitting-balance support: No upper extremity supported;Feet supported Sitting balance-Leahy Scale: Good     Standing balance support: Bilateral upper extremity supported Standing balance-Leahy Scale: Poor                             ADL either performed or assessed with clinical judgement   ADL Overall ADL's : Needs assistance/impaired     Grooming: Wash/dry hands;Wash/dry  face;Standing;Min guard       Lower Body Bathing: Minimal assistance;Sit to/from stand Lower Body Bathing Details (indicate cue type and reason): simulated     Lower Body Dressing: Cueing for safety;Cueing for sequencing;Cueing for compensatory techniques;Sitting/lateral leans;Moderate assistance   Toilet Transfer: Min guard;RW;Ambulation   Toileting- Clothing Manipulation and Hygiene: Moderate assistance;Sit to/from stand       Functional mobility during ADLs: Min guard;Rolling walker;Cueing for safety General ADL Comments: reviewed use of reacher for LB dressing and safe retrival of items at low and high surfaces     Vision Baseline Vision/History: Wears glasses Wears Glasses: At all times Patient Visual Report: No change from baseline     Perception     Praxis      Cognition Arousal/Alertness: Awake/alert Behavior During Therapy: WFL for tasks assessed/performed Overall Cognitive Status: No family/caregiver present to determine baseline cognitive functioning                                 General Comments: slow processing        Exercises     Shoulder Instructions       General Comments      Pertinent Vitals/ Pain       Pain Assessment: 0-10 Pain Score: 4  Faces Pain Scale: Hurts a little bit Pain Location: discomfort in RLE Pain Descriptors / Indicators: Discomfort;Grimacing Pain Intervention(s): Premedicated before session;Monitored during session  Home Living  Prior Functioning/Environment              Frequency  Min 2X/week        Progress Toward Goals  OT Goals(current goals can now be found in the care plan section)  Progress towards OT goals: Progressing toward goals     Plan Discharge plan remains appropriate    Co-evaluation                 AM-PAC OT "6 Clicks" Daily Activity     Outcome Measure   Help from another person eating meals?:  None Help from another person taking care of personal grooming?: A Little Help from another person toileting, which includes using toliet, bedpan, or urinal?: A Little Help from another person bathing (including washing, rinsing, drying)?: A Lot Help from another person to put on and taking off regular upper body clothing?: None Help from another person to put on and taking off regular lower body clothing?: A Lot 6 Click Score: 18    End of Session Equipment Utilized During Treatment: Gait belt;Rolling walker  OT Visit Diagnosis: Other abnormalities of gait and mobility (R26.89);Muscle weakness (generalized) (M62.81);Pain Pain - Right/Left: Right Pain - part of body: Leg   Activity Tolerance Patient tolerated treatment well   Patient Left in bed;with call bell/phone within reach   Nurse Communication          Time: 5465-6812 OT Time Calculation (min): 28 min  Charges: OT General Charges $OT Visit: 1 Visit OT Treatments $Self Care/Home Management : 8-22 mins $Therapeutic Activity: 8-22 mins     Galen Manila 07/18/2020, 3:07 PM

## 2020-07-18 NOTE — Progress Notes (Signed)
ANTICOAGULATION CONSULT NOTE  Pharmacy Consult for Heparin >> apixaban Indication: PVD with hx DVT  Assessment: 49 y.o. male with h/o PAD and DVT on Eliquis PTA.  Eliquis, ASA, and Plavix held for fem-pop bypass 12/1.   Hgb 8.6, plt 300. Switch from heparin infusion to apixaban today. No s/sx of bleeding.   Goal of Therapy:  Heparin level 0.3-0.7 units/ml Monitor platelets by anticoagulation protocol: Yes   Plan:  -Apixaban 5 mg BID  -Monitor CBC and for s/sx of bleeding   Thanks for allowing pharmacy to be a part of this patient's care.  Sherron Monday, PharmD, BCCCP Clinical Pharmacist  Phone: [phone] 07/18/2020 2:55 PM  Please check AMION for all [facility] Pharmacy phone numbers After 10:00 PM, call Main Pharmacy [phone]

## 2020-07-18 NOTE — Progress Notes (Signed)
@  2039 Dr. Chestine Spore paged, on-call for Dr. Myra Gianotti regarding SNF's [facility]) refusal of pt with current wound vac order (see prior) note. MD endorsed to maintain WTD BID Dressing Changes until AM when Day Team can reassess.

## 2020-07-18 NOTE — TOC Transition Note (Addendum)
Transition of Care [facility]) - CM/SW Discharge Note   Patient Details  Name: Paul [name]. MRN: [id number] Date of Birth: Feb 01, 1971  Transition of Care [facility]) CM/SW Contact:  Erin Sons, LCSW Phone Number: 07/18/2020, 3:32 PM   Clinical Narrative:     CSW spoke with [place] from [place] who confirmed wound vac would come tomorrow. She stated that pt could come with wet to dry dressings today and switch to wound vac tomorrow.   CSW confirmed with [employer] that wound vac may come today and at latest tomorrow.   CSW notified Vascular PA and confirmed plan to d/c with wet to dry dressings with wound vac arriving at [facility] either today or tomorrow. CSW notified pt.  Patient will DC to: [place] Anticipated DC date: 07/18/20 Family notified: Pt will notify friends/family Transport by: Sharin Mons   Per MD patient ready for DC to [facility] [place]. RN, patient, patient's family, and facility notified of DC. Discharge Summary and FL2 sent to facility. RN to call report prior to discharge [phone]). DC packet on chart. Ambulance transport requested for patient.   CSW will sign off for now as social work intervention is no longer needed. Please consult [place] again if new needs arise.    Final next level of care: Skilled Nursing Facility Barriers to Discharge: Continued Medical Work up,SNF Pending bed offer   Patient Goals and CMS Choice Patient states their goals for this hospitalization and ongoing recovery are:: home with a friend CMS Medicare.gov Compare Post Acute Care list provided to:: Patient Choice offered to / list presented to : Patient  Discharge Placement             Skilled Nursing Facility  [facility]  Transport by [facility]  Pt to notify friends/family  Date of D/C 07/18/20          Discharge Plan and Services In-house Referral: [facility] Discharge Planning Services: CM Consult            DME Arranged: N/A DME Agency: NA       HH Arranged: NA HH  Agency: NA        Social Determinants of Health (SDOH) Interventions Food Insecurity Interventions: Other (Comment) (denied for food stamps) Financial Strain Interventions: Other (Comment) Housing Interventions: Other (Comment) (VI SPDAT completed and sent to [facility] ending homelessness) Transportation Interventions: Intervention Not Indicated   Readmission Risk Interventions No flowsheet data found.

## 2020-07-18 NOTE — Progress Notes (Addendum)
Progress Note    07/18/2020 8:24 AM 8 Days Post-Op  Subjective:  No complaints this morning. Sitting up in chair. Not complaining of any pain in legs. Ambulated in hall yesterday   Vitals:   07/18/20 0327 07/18/20 0801  BP: (!) 147/94 (!) 135/98  Pulse: 97 96  Resp: 17   Temp: 98.6 F (37 C) (!) 97.5 F (36.4 C)  SpO2: 94% 94%   Physical Exam: Cardiac:  regular Lungs: non labored Incisions: Right proximal thigh saphenectomy site with wound VAC to suction. 100 cc serous output. Open saphenectomy incision distal to this. Rest of left leg with newly changed dressings. Dressings clean and dry Extremities: Right foot with Pero and PT Doppler signals. Right foot warm. Left PT doppler signal  Neurologic: alert and oriented  CBC    Component Value Date/Time   WBC 6.9 07/18/2020 0138   RBC 4.13 (L) 07/18/2020 0138   HGB 8.6 (L) 07/18/2020 0138   HGB 9.1 (L) 06/28/2020 0946   HCT 29.9 (L) 07/18/2020 0138   HCT 33.4 (L) 06/28/2020 0946   PLT 300 07/18/2020 0138   PLT 335 06/28/2020 0946   MCV 72.4 (L) 07/18/2020 0138   MCV 68 (L) 06/28/2020 0946   MCH 20.8 (L) 07/18/2020 0138   MCHC 28.8 (L) 07/18/2020 0138   RDW 28.5 (H) 07/18/2020 0138   RDW 21.1 (H) 06/28/2020 0946   LYMPHSABS 1.3 06/28/2020 0946   MONOABS 0.6 02/16/2020 2044   EOSABS 0.0 06/28/2020 0946   BASOSABS 0.0 06/28/2020 0946    BMET    Component Value Date/Time   NA 132 (L) 07/16/2020 0125   NA 136 06/28/2020 0946   K 4.6 07/16/2020 0125   CL 103 07/16/2020 0125   CO2 21 (L) 07/16/2020 0125   GLUCOSE 142 (H) 07/16/2020 0125   BUN 11 07/16/2020 0125   BUN 9 06/28/2020 0946   CREATININE 0.99 07/16/2020 0125   CALCIUM 8.0 (L) 07/16/2020 0125   GFRNONAA >60 07/16/2020 0125   GFRAA 110 06/28/2020 0946    INR    Component Value Date/Time   INR 1.1 (H) 08/24/2011 1002     Intake/Output Summary (Last 24 hours) at 07/18/2020 0824 Last data filed at 07/18/2020 0803 Gross per 24 hour  Intake 730 ml   Output 850 ml  Net -120 ml     Assessment/Plan:  49 y.o. male is s/p R Ak pop to PTA bypass with vein and L popliteal angiopasty 8 Days Post-Op. Bilateral lower extremities well perfused and warm. Right with Doppler PT/ pero signals. Left PT doppler signal. Right proximal thigh saphenectomy site with Wound VAC to suction. Serous output. Will need wound vac changed tomorrow. Continue dry dressings rest of right leg. He remains on Asa and Plavix. Is on IV heparin with plans to transition to Eliquis. Appreciate cardiology assistance. Tolerating Toprol. TOC working on SNF placement pending offer and further d/c planning. Continue to ambulate with PT today  DVT prophylaxis:   Heparin gtt   Graceann Congress, PA-C [facility] [phone] 07/18/2020 8:24 AM    Addendum: Patient has bed available today for SNF. He will leave here with wet to dry dressing to right proximal thigh and dry dressings to rest of right leg. Wound VAC will be available for application at SNF today or tomorrow. CSW will continue to assist patient for further housing needs following SNF discharge. He is otherwise stable for discharge. He will go home on Aspirin, Statin, and Plavix. He will  have follow up with Dr. Chestine Spore 4-6 weeks with bypass graft duplex and ABIs  I have seen and evaluated the patient.  I agree with the above assessment and plan.  He has a wound VAC in place for seroma drainage from the vein harvest incision.  He will be on triple therapy for 1 month and then we will go back to his preoperative regimen.  I anticipate discharge to SNF today  Durene Cal

## 2020-07-18 NOTE — Progress Notes (Signed)
ANTICOAGULATION CONSULT NOTE  Pharmacy Consult for Heparin Indication: PVD with hx DVT  Assessment: 49 y.o. male with h/o PAD and DVT on Eliquis PTA.  Eliquis, ASA, and Plavix held for fem-pop bypass 12/1.  Heparin level this am 0.27 units/ml.  No issues noted with infusion  Goal of Therapy:  Heparin level 0.3-0.7 units/ml Monitor platelets by anticoagulation protocol: Yes   Plan:  -Increase heparin to 1450 units/hr -Heparin level and CBC daily while on heparin. -Plans noted to resume Eliquis prior to discharge.  Thanks for allowing pharmacy to be a part of this patient's care.  Talbert Cage, PharmD Clinical Pharmacist

## 2020-07-18 NOTE — Progress Notes (Signed)
@  2019 Report called to [place], RN @ [facility]. Nurse endorsed pt would not be able to come until he had a wound vac in place. Nurse endorsed she would have her admissions coordinator call me with more details.  @2033  Teena of called this RN and endorsed pt could not come until pt's wound vac was placed here. [place] endorsed that [facility] does not service the type of wound vac ordered by MD. Will call MD on-call and convey situation.

## 2020-07-18 NOTE — Progress Notes (Signed)
Mobility Specialist: Progress Note   07/18/20 1541  Mobility  Activity Ambulated in hall  Level of Assistance Minimal assist, patient does 75% or more  Assistive Device Four wheel walker  Distance Ambulated (ft) 100 ft  Mobility Response Tolerated well  Mobility performed by Mobility specialist  $Mobility charge 1 Mobility   Pre-Mobility: 80 HR Post-Mobility: 80 HR, 135/90 BP  Pt stopped to take one seated rest break halfway through ambulation due to feeling fatigued. Seated break lasted for one minute. Pt back to bed per request.   Cristal Deer Adysson Revelle Mobility Specialist

## 2020-07-18 NOTE — Progress Notes (Signed)
Physical Therapy Treatment Patient Details Name: Paul [name]. MRN: [id number] DOB: 1970/12/26 Today's Date: 07/18/2020    History of Present Illness 49 yo admitted 11/30 with right popliteal occlusion. Pt s/p bil LE thrombectomy several months ago. 12/1 Rt fem pop BPG and Left PTA with post op bradycardia and hypotension with transfer to ICU. PMhx: PTSD, MI, HTN, DM, bipolar, homelessness, HLD, CAD, PAD    PT Comments    Pt seated in recliner and motivated to participate.  Pt performed gt training with rollator.  Minor safety issues remain and he continues with balance impairments.  Plan for SNF remains appropriate at this time.     Follow Up Recommendations  SNF;Supervision/Assistance - 24 hour     Equipment Recommendations  3in1 (PT)    Recommendations for Other Services       Precautions / Restrictions Precautions Precautions: Fall Precaution Comments: watch RLE bandages for bleeding Restrictions Weight Bearing Restrictions: Yes RLE Weight Bearing: Weight bearing as tolerated    Mobility  Bed Mobility               General bed mobility comments: pt in recliner upon arrival  Transfers Overall transfer level: Needs assistance Equipment used: 4-wheeled walker Transfers: Sit to/from Stand Sit to Stand: Min guard Stand pivot transfers: Min guard       General transfer comment: pt very, slow and deliberate with good form and hand placement on bed to push off, min guard for safety, poor eccentric load returning to seated surface.  Pt utilized rollator for seated rest break and mildly unsteady during transition.  Ambulation/Gait Ambulation/Gait assistance: Min guard Gait Distance (Feet): 45 Feet (x2 trials.) Assistive device: 4-wheeled walker Gait Pattern/deviations: Decreased step length - left;Decreased stance time - right;Decreased dorsiflexion - right;Step-through pattern Gait velocity: slowed   General Gait Details: min guard for safety, vc for  proximity to Rollator and upright posture,  Pt flexes more when fatigued and needs reminders.   Stairs             Wheelchair Mobility    Modified Rankin (Stroke Patients Only)       Balance Overall balance assessment: Needs assistance Sitting-balance support: No upper extremity supported;Feet supported Sitting balance-Leahy Scale: Good     Standing balance support: Bilateral upper extremity supported Standing balance-Leahy Scale: Poor                              Cognition Arousal/Alertness: Awake/alert Behavior During Therapy: WFL for tasks assessed/performed Overall Cognitive Status: No family/caregiver present to determine baseline cognitive functioning                                 General Comments: slow processing      Exercises      General Comments        Pertinent Vitals/Pain Pain Assessment: 0-10 Pain Score: 4  Faces Pain Scale: Hurts a little bit Pain Location: discomfort in RLE Pain Descriptors / Indicators: Discomfort;Grimacing Pain Intervention(s): Premedicated before session;Monitored during session    Home Living                      Prior Function            PT Goals (current goals can now be found in the care plan section) Acute Rehab PT Goals Patient Stated Goal: be able to move  Potential to Achieve Goals: Fair Progress towards PT goals: Progressing toward goals    Frequency    Min 3X/week      PT Plan Current plan remains appropriate    Co-evaluation              AM-PAC PT "6 Clicks" Mobility   Outcome Measure  Help needed turning from your back to your side while in a flat bed without using bedrails?: A Little Help needed moving from lying on your back to sitting on the side of a flat bed without using bedrails?: A Little Help needed moving to and from a bed to a chair (including a wheelchair)?: A Little Help needed standing up from a chair using your arms (e.g.,  wheelchair or bedside chair)?: A Little Help needed to walk in hospital room?: A Little Help needed climbing 3-5 steps with a railing? : A Little 6 Click Score: 18    End of Session Equipment Utilized During Treatment: Gait belt Activity Tolerance: Patient tolerated treatment well Patient left: in chair;with call bell/phone within reach;with chair alarm set Nurse Communication: Mobility status PT Visit Diagnosis: Unsteadiness on feet (R26.81);Other abnormalities of gait and mobility (R26.89);Pain     Time: 4098-1191 PT Time Calculation (min) (ACUTE ONLY): 19 min  Charges:  $Gait Training: 8-22 mins                     [name] , PTA Acute Rehabilitation Services Pager [phone] Office [phone]     Illyana Schorsch Artis Delay 07/18/2020, 3:20 PM

## 2020-07-19 DIAGNOSIS — I513 Intracardiac thrombosis, not elsewhere classified: Secondary | ICD-10-CM | POA: Diagnosis not present

## 2020-07-19 DIAGNOSIS — D508 Other iron deficiency anemias: Secondary | ICD-10-CM | POA: Diagnosis not present

## 2020-07-19 DIAGNOSIS — M79606 Pain in leg, unspecified: Secondary | ICD-10-CM | POA: Diagnosis present

## 2020-07-19 DIAGNOSIS — R4182 Altered mental status, unspecified: Secondary | ICD-10-CM | POA: Diagnosis not present

## 2020-07-19 DIAGNOSIS — R5381 Other malaise: Secondary | ICD-10-CM | POA: Diagnosis not present

## 2020-07-19 DIAGNOSIS — I5020 Unspecified systolic (congestive) heart failure: Secondary | ICD-10-CM | POA: Diagnosis present

## 2020-07-19 DIAGNOSIS — E0840 Diabetes mellitus due to underlying condition with diabetic neuropathy, unspecified: Secondary | ICD-10-CM | POA: Diagnosis present

## 2020-07-19 DIAGNOSIS — I480 Paroxysmal atrial fibrillation: Secondary | ICD-10-CM | POA: Diagnosis not present

## 2020-07-19 DIAGNOSIS — R41841 Cognitive communication deficit: Secondary | ICD-10-CM | POA: Diagnosis present

## 2020-07-19 DIAGNOSIS — R2689 Other abnormalities of gait and mobility: Secondary | ICD-10-CM | POA: Diagnosis present

## 2020-07-19 DIAGNOSIS — R079 Chest pain, unspecified: Secondary | ICD-10-CM | POA: Diagnosis not present

## 2020-07-19 DIAGNOSIS — D649 Anemia, unspecified: Secondary | ICD-10-CM | POA: Diagnosis not present

## 2020-07-19 DIAGNOSIS — I2510 Atherosclerotic heart disease of native coronary artery without angina pectoris: Secondary | ICD-10-CM | POA: Diagnosis present

## 2020-07-19 DIAGNOSIS — I739 Peripheral vascular disease, unspecified: Secondary | ICD-10-CM | POA: Diagnosis present

## 2020-07-19 DIAGNOSIS — F4310 Post-traumatic stress disorder, unspecified: Secondary | ICD-10-CM | POA: Diagnosis not present

## 2020-07-19 DIAGNOSIS — I10 Essential (primary) hypertension: Secondary | ICD-10-CM | POA: Diagnosis present

## 2020-07-19 DIAGNOSIS — I70229 Atherosclerosis of native arteries of extremities with rest pain, unspecified extremity: Secondary | ICD-10-CM | POA: Diagnosis not present

## 2020-07-19 DIAGNOSIS — I255 Ischemic cardiomyopathy: Secondary | ICD-10-CM | POA: Diagnosis not present

## 2020-07-19 DIAGNOSIS — F32A Depression, unspecified: Secondary | ICD-10-CM | POA: Diagnosis not present

## 2020-07-19 DIAGNOSIS — F339 Major depressive disorder, recurrent, unspecified: Secondary | ICD-10-CM | POA: Diagnosis present

## 2020-07-19 DIAGNOSIS — R1110 Vomiting, unspecified: Secondary | ICD-10-CM | POA: Diagnosis not present

## 2020-07-19 DIAGNOSIS — I5022 Chronic systolic (congestive) heart failure: Secondary | ICD-10-CM | POA: Diagnosis not present

## 2020-07-19 DIAGNOSIS — M6281 Muscle weakness (generalized): Secondary | ICD-10-CM | POA: Diagnosis present

## 2020-07-19 DIAGNOSIS — E119 Type 2 diabetes mellitus without complications: Secondary | ICD-10-CM | POA: Diagnosis not present

## 2020-07-19 DIAGNOSIS — Z7401 Bed confinement status: Secondary | ICD-10-CM | POA: Diagnosis not present

## 2020-07-19 DIAGNOSIS — F419 Anxiety disorder, unspecified: Secondary | ICD-10-CM | POA: Diagnosis not present

## 2020-07-19 DIAGNOSIS — E785 Hyperlipidemia, unspecified: Secondary | ICD-10-CM | POA: Diagnosis present

## 2020-07-19 DIAGNOSIS — E11649 Type 2 diabetes mellitus with hypoglycemia without coma: Secondary | ICD-10-CM | POA: Diagnosis not present

## 2020-07-19 DIAGNOSIS — F319 Bipolar disorder, unspecified: Secondary | ICD-10-CM | POA: Diagnosis not present

## 2020-07-19 DIAGNOSIS — M2550 Pain in unspecified joint: Secondary | ICD-10-CM | POA: Diagnosis not present

## 2020-07-19 DIAGNOSIS — I998 Other disorder of circulatory system: Secondary | ICD-10-CM | POA: Diagnosis present

## 2020-07-19 DIAGNOSIS — R0789 Other chest pain: Secondary | ICD-10-CM | POA: Diagnosis not present

## 2020-07-19 DIAGNOSIS — Z4801 Encounter for change or removal of surgical wound dressing: Secondary | ICD-10-CM | POA: Diagnosis not present

## 2020-07-19 LAB — GLUCOSE, CAPILLARY
Glucose-Capillary: 132 mg/dL — ABNORMAL HIGH (ref 70–99)
Glucose-Capillary: 187 mg/dL — ABNORMAL HIGH (ref 70–99)

## 2020-07-19 LAB — CBC
HCT: 26.6 % — ABNORMAL LOW (ref 39.0–52.0)
Hemoglobin: 7.9 g/dL — ABNORMAL LOW (ref 13.0–17.0)
MCH: 21.1 pg — ABNORMAL LOW (ref 26.0–34.0)
MCHC: 29.7 g/dL — ABNORMAL LOW (ref 30.0–36.0)
MCV: 70.9 fL — ABNORMAL LOW (ref 80.0–100.0)
Platelets: 300 10*3/uL (ref 150–400)
RBC: 3.75 MIL/uL — ABNORMAL LOW (ref 4.22–5.81)
RDW: 28.7 % — ABNORMAL HIGH (ref 11.5–15.5)
WBC: 6.2 10*3/uL (ref 4.0–10.5)
nRBC: 1.1 % — ABNORMAL HIGH (ref 0.0–0.2)

## 2020-07-19 NOTE — TOC Progression Note (Signed)
Transition of Care (TOC) - Progression Note    Patient Details  Name: Paul [name]. MRN: [id number] Date of Birth: [birth year]  Transition of Care [facility]) CM/SW Contact  Eduard Roux, [place] Phone Number: 07/19/2020, 11:51 AM  Clinical Narrative:     Patient will DC to: [place]  DC Date: 07/19/2020 Family Notified: Cassandra, friend Transport By: Sharin Mons  Per MD patient is ready for discharge. RN, patient, and facility notified of DC. Discharge Summary sent to facility. RN given number for [phone]. Ambulance transport requested for patient.   Clinical Social Worker signing off.  Antony Blackbird, MSW, LCSWA Clinical Social Worker   Expected Discharge Plan: Skilled Nursing Facility Barriers to Discharge: Continued Medical Work up,SNF Pending bed offer  Expected Discharge Plan and Services Expected Discharge Plan: Skilled Nursing Facility In-house [facility]erral: [facility] Discharge Planning Services: CM Consult   Living arrangements for the past 2 months: Homeless Expected Discharge Date: 07/18/20               DME Arranged: N/A DME Agency: NA       HH Arranged: NA HH Agency: NA         Social Determinants of Health (SDOH) Interventions Food Insecurity Interventions: Other (Comment) (denied for food stamps) Financial Strain Interventions: Other (Comment) Housing Interventions: Other (Comment) ([facility] SPDAT completed and sent to [facility] ending homelessness) Transportation Interventions: Intervention Not Indicated  Readmission Risk Interventions No flowsheet data found.

## 2020-07-19 NOTE — Progress Notes (Signed)
PT Cancellation Note  Patient Details Name: Paul [name]. MRN: [id number] DOB: 28-Nov-1970   Cancelled Treatment:    Reason Eval/Treat Not Completed: (P) Other (comment) (Pt to d/c tp SNF via PTAR, will defer PT needs to next level of care.)   [name] J [name] 07/19/2020, 1:01 PM  [name] , PTA Acute Rehabilitation Services Pager [phone] Office [phone]

## 2020-07-19 NOTE — Progress Notes (Signed)
Pt discharged today to [facility] via [place].  Pt left with all of their personal belongings.  Pt taken off telemetry and CCMD notified.  Pt did not have an IV.  AVS documentation reviewed and sent with PTAR.  Report at [facility] given to [facility] RN.

## 2020-07-19 NOTE — Progress Notes (Signed)
  Progress Note    07/19/2020 11:21 AM 9 Days Post-Op  Subjective:  No complaints   Vitals:   07/19/20 1040 07/19/20 1041  BP: (!) 125/92   Pulse:  83  Resp: 16   Temp:    SpO2:     Physical Exam: Cardiac:  regular Lungs:  Non labored Incisions:  Right proximal thigh saphenectomy incision dehiscence. Serous drainage. Remainder of incisions intact with mild separation and serous drainage. Wet to dry dressings applied. Right lower leg incisions intact and healing well. Dry dressings applied Extremities:  Bilateral lower extremities warm and well perfused. Doppler PT bilaterally. Right with doppler peroneal signal Neurologic: alert and oriented  CBC    Component Value Date/Time   WBC 6.2 07/19/2020 0146   RBC 3.75 (L) 07/19/2020 0146   HGB 7.9 (L) 07/19/2020 0146   HGB 9.1 (L) 06/28/2020 0946   HCT 26.6 (L) 07/19/2020 0146   HCT 33.4 (L) 06/28/2020 0946   PLT 300 07/19/2020 0146   PLT 335 06/28/2020 0946   MCV 70.9 (L) 07/19/2020 0146   MCV 68 (L) 06/28/2020 0946   MCH 21.1 (L) 07/19/2020 0146   MCHC 29.7 (L) 07/19/2020 0146   RDW 28.7 (H) 07/19/2020 0146   RDW 21.1 (H) 06/28/2020 0946   LYMPHSABS 1.3 06/28/2020 0946   MONOABS 0.6 02/16/2020 2044   EOSABS 0.0 06/28/2020 0946   BASOSABS 0.0 06/28/2020 0946    BMET    Component Value Date/Time   NA 132 (L) 07/16/2020 0125   NA 136 06/28/2020 0946   K 4.6 07/16/2020 0125   CL 103 07/16/2020 0125   CO2 21 (L) 07/16/2020 0125   GLUCOSE 142 (H) 07/16/2020 0125   BUN 11 07/16/2020 0125   BUN 9 06/28/2020 0946   CREATININE 0.99 07/16/2020 0125   CALCIUM 8.0 (L) 07/16/2020 0125   GFRNONAA >60 07/16/2020 0125   GFRAA 110 06/28/2020 0946    INR    Component Value Date/Time   INR 1.1 (H) 08/24/2011 1002     Intake/Output Summary (Last 24 hours) at 07/19/2020 1121 Last data filed at 07/19/2020 0343 Gross per 24 hour  Intake 1090.41 ml  Output 1040 ml  Net 50.41 ml     Assessment/Plan:  49 y.o. male is  s/p R Ak pop to PTA bypass with vein and L popliteal angiopasty 9 Days Post-Op. Bilateral lower extremities remain well perfused and warm with bilateral PT signals. Tolerating ambulation. Tolerating diet. Is having a lot of serous drainage from saphenectomy site in right thigh. Wet to dry dressings changed today. Incisions otherwise look good. There were Wound VAC issues so patient did not discharge as planned yesterday. Anticipate discharge today once would vac arranged    Graceann Congress, PA-C [facility] [phone] 07/19/2020 11:21 AM

## 2020-07-24 DIAGNOSIS — F32A Depression, unspecified: Secondary | ICD-10-CM | POA: Diagnosis not present

## 2020-07-24 DIAGNOSIS — Z4801 Encounter for change or removal of surgical wound dressing: Secondary | ICD-10-CM | POA: Diagnosis not present

## 2020-07-24 DIAGNOSIS — M6281 Muscle weakness (generalized): Secondary | ICD-10-CM | POA: Diagnosis not present

## 2020-07-24 DIAGNOSIS — E119 Type 2 diabetes mellitus without complications: Secondary | ICD-10-CM | POA: Diagnosis not present

## 2020-07-24 DIAGNOSIS — I739 Peripheral vascular disease, unspecified: Secondary | ICD-10-CM | POA: Diagnosis not present

## 2020-07-24 DIAGNOSIS — I5022 Chronic systolic (congestive) heart failure: Secondary | ICD-10-CM | POA: Diagnosis not present

## 2020-07-25 DIAGNOSIS — I5022 Chronic systolic (congestive) heart failure: Secondary | ICD-10-CM | POA: Diagnosis not present

## 2020-07-25 DIAGNOSIS — R5381 Other malaise: Secondary | ICD-10-CM | POA: Diagnosis not present

## 2020-07-25 DIAGNOSIS — I513 Intracardiac thrombosis, not elsewhere classified: Secondary | ICD-10-CM | POA: Diagnosis not present

## 2020-07-25 DIAGNOSIS — E119 Type 2 diabetes mellitus without complications: Secondary | ICD-10-CM | POA: Diagnosis not present

## 2020-07-25 DIAGNOSIS — I739 Peripheral vascular disease, unspecified: Secondary | ICD-10-CM | POA: Diagnosis not present

## 2020-07-25 DIAGNOSIS — D649 Anemia, unspecified: Secondary | ICD-10-CM | POA: Diagnosis not present

## 2020-07-31 ENCOUNTER — Telehealth (HOSPITAL_COMMUNITY): Payer: Self-pay | Admitting: Licensed Clinical Social Worker

## 2020-07-31 DIAGNOSIS — M6281 Muscle weakness (generalized): Secondary | ICD-10-CM | POA: Diagnosis not present

## 2020-07-31 DIAGNOSIS — I739 Peripheral vascular disease, unspecified: Secondary | ICD-10-CM | POA: Diagnosis not present

## 2020-07-31 DIAGNOSIS — Z4801 Encounter for change or removal of surgical wound dressing: Secondary | ICD-10-CM | POA: Diagnosis not present

## 2020-07-31 NOTE — Telephone Encounter (Signed)
CSW reached out to patient to follow up. Patient states he is still at the SNF with a wound vac and feels like he is making progress. Patient states he can remain at the facility through the early part of January. Patient will reach out to CSW then for any assistance. CSW available as needed. Lasandra Beech, LCSW, CCSW-MCS [phone]

## 2020-08-07 DIAGNOSIS — I739 Peripheral vascular disease, unspecified: Secondary | ICD-10-CM | POA: Diagnosis not present

## 2020-08-07 DIAGNOSIS — M6281 Muscle weakness (generalized): Secondary | ICD-10-CM | POA: Diagnosis not present

## 2020-08-07 DIAGNOSIS — Z4801 Encounter for change or removal of surgical wound dressing: Secondary | ICD-10-CM | POA: Diagnosis not present

## 2020-08-08 DIAGNOSIS — R1110 Vomiting, unspecified: Secondary | ICD-10-CM | POA: Diagnosis not present

## 2020-08-10 DIAGNOSIS — I10 Essential (primary) hypertension: Secondary | ICD-10-CM | POA: Diagnosis present

## 2020-08-10 DIAGNOSIS — I70229 Atherosclerosis of native arteries of extremities with rest pain, unspecified extremity: Secondary | ICD-10-CM | POA: Diagnosis not present

## 2020-08-10 DIAGNOSIS — R2689 Other abnormalities of gait and mobility: Secondary | ICD-10-CM | POA: Diagnosis present

## 2020-08-10 DIAGNOSIS — E785 Hyperlipidemia, unspecified: Secondary | ICD-10-CM | POA: Diagnosis present

## 2020-08-10 DIAGNOSIS — M79606 Pain in leg, unspecified: Secondary | ICD-10-CM | POA: Diagnosis present

## 2020-08-10 DIAGNOSIS — R41841 Cognitive communication deficit: Secondary | ICD-10-CM | POA: Diagnosis present

## 2020-08-10 DIAGNOSIS — E0840 Diabetes mellitus due to underlying condition with diabetic neuropathy, unspecified: Secondary | ICD-10-CM | POA: Diagnosis present

## 2020-08-10 DIAGNOSIS — I739 Peripheral vascular disease, unspecified: Secondary | ICD-10-CM | POA: Diagnosis present

## 2020-08-10 DIAGNOSIS — E11649 Type 2 diabetes mellitus with hypoglycemia without coma: Secondary | ICD-10-CM | POA: Diagnosis not present

## 2020-08-10 DIAGNOSIS — R4182 Altered mental status, unspecified: Secondary | ICD-10-CM | POA: Diagnosis not present

## 2020-08-10 DIAGNOSIS — I2510 Atherosclerotic heart disease of native coronary artery without angina pectoris: Secondary | ICD-10-CM | POA: Diagnosis present

## 2020-08-10 DIAGNOSIS — I998 Other disorder of circulatory system: Secondary | ICD-10-CM | POA: Diagnosis present

## 2020-08-10 DIAGNOSIS — M6281 Muscle weakness (generalized): Secondary | ICD-10-CM | POA: Diagnosis present

## 2020-08-10 DIAGNOSIS — I5020 Unspecified systolic (congestive) heart failure: Secondary | ICD-10-CM | POA: Diagnosis present

## 2020-08-10 DIAGNOSIS — F339 Major depressive disorder, recurrent, unspecified: Secondary | ICD-10-CM | POA: Diagnosis present

## 2020-08-10 DIAGNOSIS — I255 Ischemic cardiomyopathy: Secondary | ICD-10-CM | POA: Diagnosis present

## 2020-08-10 DIAGNOSIS — R5381 Other malaise: Secondary | ICD-10-CM | POA: Diagnosis not present

## 2020-08-10 DIAGNOSIS — I480 Paroxysmal atrial fibrillation: Secondary | ICD-10-CM | POA: Diagnosis not present

## 2020-08-10 DIAGNOSIS — D649 Anemia, unspecified: Secondary | ICD-10-CM | POA: Diagnosis not present

## 2020-08-12 ENCOUNTER — Telehealth: Payer: Self-pay | Admitting: Licensed Clinical Social Worker

## 2020-08-12 NOTE — Telephone Encounter (Signed)
CSW received call from patient stating anticipated discharge from the SNF will be later this month. Patient states he has a place to go with friends and prefers to not live alone. Patient stated he has a car for transport to and from MD appointments. Patient denies any further assistance needed from CSW. CSW offered continued support as needed and provided contact information. Patient grateful for the support and will reach out as needed. Lasandra Beech, LCSW, CCSW-MCS [phone]

## 2020-08-14 ENCOUNTER — Telehealth: Payer: Self-pay

## 2020-08-14 DIAGNOSIS — Z4801 Encounter for change or removal of surgical wound dressing: Secondary | ICD-10-CM | POA: Diagnosis not present

## 2020-08-14 DIAGNOSIS — M6281 Muscle weakness (generalized): Secondary | ICD-10-CM | POA: Diagnosis not present

## 2020-08-14 DIAGNOSIS — I739 Peripheral vascular disease, unspecified: Secondary | ICD-10-CM | POA: Diagnosis not present

## 2020-08-14 NOTE — Telephone Encounter (Signed)
Received call from wound care RN. They are very concerned about the wound on patients R medial thigh. Says it is open, draining and there is sloughing tissue. Denies patient has fever, swelling, or malodorous drainage. They are currently doing wet to dry dressing changes. Advised that wet to dry dressing changes were appropriate and could continue. RN states "I think it needs to be debrided." Placed patient on PA schedule tomorrow for evaluation.

## 2020-08-15 ENCOUNTER — Ambulatory Visit (INDEPENDENT_AMBULATORY_CARE_PROVIDER_SITE_OTHER): Payer: Self-pay | Admitting: Physician Assistant

## 2020-08-15 ENCOUNTER — Other Ambulatory Visit: Payer: Self-pay

## 2020-08-15 VITALS — BP 100/70 | HR 107 | Temp 97.3°F | Resp 16 | Ht 71.0 in | Wt 145.7 lb

## 2020-08-15 DIAGNOSIS — T8189XA Other complications of procedures, not elsewhere classified, initial encounter: Secondary | ICD-10-CM

## 2020-08-15 NOTE — Progress Notes (Signed)
POST OPERATIVE OFFICE NOTE    CC:  F/u for surgery  HPI:  This is a 50 y.o. male who is s/p s/p R Ak pop to PTA bypass with vein and L popliteal angiopasty on 07/10/20 by Dr. Trula Slade.  He was having a lot of serous drainage from saphenectomy site in right thigh.  He was discharged with wet to dry dressing changes daily on 07/19/20.  He has been followed by [facility] RN and she called asking for his wound to be checked.    Pt returns today for follow up.  He has wound breakdown at all vein harvest sites.  He denise fever and chills.    Allergies  Allergen Reactions  . Shrimp [Shellfish Allergy] Swelling    tongue    Current Outpatient Medications  Medication Sig Dispense Refill  . apixaban (ELIQUIS) 5 MG TABS tablet Take 1 tablet (5 mg total) by mouth 2 (two) times daily. 180 tablet 3  . ARIPiprazole (ABILIFY) 5 MG tablet Take 1 tablet (5 mg total) by mouth daily. 30 tablet 0  . aspirin 81 MG EC tablet Take 1 tablet (81 mg total) by mouth daily. (Patient taking differently: Take 81 mg by mouth in the morning and at bedtime. Morning and afternoon) 90 tablet 0  . atorvastatin (LIPITOR) 40 MG tablet Take 1 tablet (40 mg total) by mouth at bedtime. 90 tablet 3  . Blood Glucose Monitoring Suppl (ONE TOUCH ULTRA 2) W/DEVICE KIT Check sugar once daily. ICD10 E11.65 1 each 0  . citalopram (CELEXA) 20 MG tablet Take 1 tablet (20 mg total) by mouth 2 (two) times daily. 180 tablet 3  . clopidogrel (PLAVIX) 75 MG tablet Take 1 tablet (75 mg total) by mouth daily. 90 tablet 3  . Continuous Glucose Monitor KIT 1 kit by Does not apply route daily. 1 kit 0  . empagliflozin (JARDIANCE) 25 MG TABS tablet Take 25 mg by mouth daily. 90 tablet 3  . ferrous sulfate 325 (65 FE) MG tablet Take 325 mg by mouth daily with breakfast.    . furosemide (LASIX) 40 MG tablet Take 1 tablet (40 mg total) by mouth daily. 30 tablet 1  . glucose blood (ONE TOUCH ULTRA TEST) test strip Check sugar once daily in the morning. ICD10  E11.65 30 each 5  . insulin glargine (LANTUS) 100 UNIT/ML Solostar Pen Inject 20 Units into the skin every morning. (Patient taking differently: Inject 5 Units into the skin daily. ) 3 mL 2  . metFORMIN (GLUCOPHAGE) 1000 MG tablet Take 1,000 mg by mouth 2 (two) times daily.    . metoprolol succinate (TOPROL-XL) 25 MG 24 hr tablet Take 1 tablet (25 mg total) by mouth daily. 30 tablet 2  . Multiple Vitamins-Minerals (MULTIVITAMIN WITH MINERALS) tablet Take 1 tablet by mouth daily.    . nitroGLYCERIN (NITROSTAT) 0.4 MG SL tablet Place 1 tablet (0.4 mg total) under the tongue every 5 (five) minutes as needed for chest pain. 30 tablet 1  . ONE TOUCH LANCETS MISC Use to check sugar once daily. ICD10 E11.65 30 each 5  . oxyCODONE-acetaminophen (PERCOCET) 5-325 MG tablet Take 1 tablet by mouth every 4 (four) hours as needed for severe pain. 30 tablet 0  . sacubitril-valsartan (ENTRESTO) 24-26 MG Take 1 tablet by mouth 2 (two) times daily. 180 tablet 3  . spironolactone (ALDACTONE) 25 MG tablet Take 0.5 tablets (12.5 mg total) by mouth daily. 45 tablet 3  . traZODone (DESYREL) 50 MG tablet Take 50 mg  by mouth at bedtime.    . triamcinolone cream (KENALOG) 0.1 % Apply 1 application topically 2 (two) times daily. 453.6 g 0   No current facility-administered medications for this visit.     ROS:  See HPI  Physical Exam:        Incision:  Right leg incisional breakdown with green drainage, no purulent drainage Extremities:  PT doppler signals B LE, no open wounds on B feet.   Assessment/Plan:  This is a 50 y.o. male who is s/p:R Ak pop to PTA bypass with vein and L popliteal angiopasty on 07/10/20 by Dr. Trula Slade.  He has stable inflow with PT doppler signals.      He is at a facility and his new instructions will be Dakin solution wet to dry dressing changes daily to open incisions, cover with dry guaze daily.  He may shower daily.   He will f/u for wound check in 1-2 weeks.  If he develops new  symptoms or has fever or chills he will call sooner.  He drove himself here and will pick up  Verdene Lennert solution from [facility] [place].    Roxy Horseman PA-C [facility] [phone]  Clinic MD:  Oneida Alar

## 2020-08-21 DIAGNOSIS — M6281 Muscle weakness (generalized): Secondary | ICD-10-CM | POA: Diagnosis not present

## 2020-08-21 DIAGNOSIS — I739 Peripheral vascular disease, unspecified: Secondary | ICD-10-CM | POA: Diagnosis not present

## 2020-08-21 DIAGNOSIS — Z4801 Encounter for change or removal of surgical wound dressing: Secondary | ICD-10-CM | POA: Diagnosis not present

## 2020-08-22 ENCOUNTER — Other Ambulatory Visit: Payer: Self-pay

## 2020-08-23 ENCOUNTER — Other Ambulatory Visit: Payer: Self-pay

## 2020-08-23 ENCOUNTER — Ambulatory Visit (HOSPITAL_COMMUNITY): Payer: Medicare Other | Attending: Cardiovascular Disease

## 2020-08-23 DIAGNOSIS — I739 Peripheral vascular disease, unspecified: Secondary | ICD-10-CM

## 2020-08-23 DIAGNOSIS — I255 Ischemic cardiomyopathy: Secondary | ICD-10-CM | POA: Insufficient documentation

## 2020-08-23 LAB — ECHOCARDIOGRAM COMPLETE
Area-P 1/2: 4.96 cm2
S' Lateral: 3.1 cm

## 2020-08-23 MED ORDER — PERFLUTREN LIPID MICROSPHERE
1.0000 mL | INTRAVENOUS | Status: AC | PRN
  Administered 2020-08-23: 3 mL via INTRAVENOUS

## 2020-08-29 DIAGNOSIS — R4182 Altered mental status, unspecified: Secondary | ICD-10-CM | POA: Diagnosis not present

## 2020-08-29 DIAGNOSIS — D649 Anemia, unspecified: Secondary | ICD-10-CM | POA: Diagnosis not present

## 2020-08-29 DIAGNOSIS — R5381 Other malaise: Secondary | ICD-10-CM | POA: Diagnosis not present

## 2020-08-29 DIAGNOSIS — I739 Peripheral vascular disease, unspecified: Secondary | ICD-10-CM | POA: Diagnosis not present

## 2020-09-01 DIAGNOSIS — I480 Paroxysmal atrial fibrillation: Secondary | ICD-10-CM | POA: Insufficient documentation

## 2020-09-01 NOTE — Progress Notes (Signed)
Cardiology Office Note   Date:  09/03/2020   ID:  Paul Hinderliter., DOB 12/31/1970, MRN [id number]  PCP:  Cleophas Dunker, DO  Cardiologist:   Minus Breeding, MD     Chief Complaint  Patient presents with  . Coronary Artery Disease      History of Present Illness: Paul [name]. is a 50 y.o. male who presents for follow up of CAD.  he had an MI in 2007 treated with an LAD and circumflex DES.  He had a catheterization in 2011 and again in 2013 that showed patent stents with moderate residual coronary disease.  He has had a depressed EF- 40-45% by echo in June 2019.  He was admitted to the hospital 02/17/2020 with leg weakness and numb, cool extremities.  He was found to have critical limb ischemia.CTA showed nearly occlusive thrombosis within bilateral femoral arteries. Dr. Trula Slade, was consulted and performed anurgent bilateral iliofemoral thrombectomy and left lower extremity fasciotomy7/10.Unfortunately, revascularization and return of neurological function on the left was not achieved, so that evening he returned to the OR for successful recanalization of left popliteal artery, posterior tibial artery, and closure of the LLE fasciotomy. An echocardiogram was done during that admission which showed patient now had an ejection fraction of 25% with apical akinesis.  It is felt that he had an apical thrombus that caused his lower extremity embolism.  After surgery he was placed on aspirin and Plavix, he was then placed on Eliquis 5 mg twice daily. The patient was discharged 02/16/2020. He was then seen in the emergency room 02/28/2020 and complained of swelling in his Lt leg.  He was seen by Dr. Trula Slade 02/29/2020.  Lower extremity Dopplers were negative for DVT.  LEA dopplers suggested some decrease in his LLE ABI- 0.64.  Ace wrap was recommended as well as f/u with Dr Trula Slade.  He has subsequently been on ASA and Eliquis.    He is staying at [facility].  He is having  dressing changes to his right leg wound.  He drives and tries to get out but he walks slowly because of this.  He has not had any new chest discomfort, neck or arm discomfort.  Is not had any new shortness of breath, PND or orthopnea.  He was seen by [facility] yesterday.   Past Medical History:  Diagnosis Date  . Anxiety   . Bipolar disorder ([place])   . CAD (coronary artery disease)    Anterior MI 2007 with DES stenting of an occluded LAD, distal 70% stenosis, OM 60% stenosis, circumflex 90% stenosis treated with a drug-eluting stent, PA 90% stenosis treated medically.  Cath January 2013 70-80% diagonal stenosis in a small vessel, OM 50% stenosis, PDA long 75% stenosis. EF 45%. He was managed medically.  . Cardiomyopathy, ischemic    EF 45%  . COVID-26 February 2020   . Diabetes mellitus   . History of chest pain 11/25/2010  . Hyperlipidemia   . Hypertension   . Myocardial infarction ([place])   . PTSD (post-traumatic stress disorder)   . PVD (peripheral vascular disease) ([place])   . Tuberculosis    1989, took pills for it     Past Surgical History:  Procedure Laterality Date  . ABDOMINAL AORTAGRAM  07/09/2020   LOWER EXTREMITY  . ABDOMINAL AORTOGRAM W/LOWER EXTREMITY Right 07/09/2020   Procedure: ABDOMINAL AORTOGRAM W/LOWER EXTREMITY;  Surgeon: Serafina Mitchell, MD;  Location: [place] [facility];  Service: Cardiovascular;  Laterality: Right;  . ANGIOPLASTY ILLIAC ARTERY Left 07/10/2020   Procedure: BALLOON ANGIOPLASTY OF LEFT POPLITEAL USING RANGER 4.35m X 480mOVER-THE-WIRE PTA BALLOON;  Surgeon: BrSerafina MitchellMD;  [facility];  Laterality: Left;  . APPLICATION OF WOUND VAC Left 02/17/2020   Procedure: APPLICATION OF WOUND VAC;  Surgeon: BrSerafina MitchellMD;  [facility];  Laterality: Left;  . Catherization  08/25/11   Dr. HoPercival Spanish. CORONARY ANGIOPLASTY WITH STENT PLACEMENT  207, 2011   4 stents placed  . DIRECT LARYNGOSCOPY Left 08/14/2019    Procedure: DIRECT LARYNGOSCOPY;  Surgeon: NeRozetta NunneryMD;  [facility];  Laterality: Left;  . FASCIOTOMY Left 02/17/2020   Procedure: Four Compartment FASCIOTOMY;  Surgeon: BrSerafina MitchellMD;  [facility];  Laterality: Left;  . FASCIOTOMY CLOSURE Left 02/17/2020   Procedure: FASCIOTOMY CLOSURE;  Surgeon: BrSerafina MitchellMD;  [facility];  Laterality: Left;  . FEMORAL-POPLITEAL BYPASS GRAFT Right 07/10/2020   Procedure: RIGHT ABOVE KNEE POPLITEAL TO POSTERIAL TIBIAL ARTERY BYPASS GRAFT;  Surgeon: BrSerafina MitchellMD;  [place];  Laterality: Right;  . LEFT HEART CATHETERIZATION WITH CORONARY ANGIOGRAM N/A 08/25/2011   Procedure: LEFT HEART CATHETERIZATION WITH CORONARY ANGIOGRAM;  Surgeon: JaMinus BreedingMD;  [facility];  Service: Cardiovascular;  Laterality: N/A;  . LOWER EXTREMITY ANGIOGRAM Bilateral 02/17/2020   Procedure: LOWER EXTREMITY ANGIOGRAM;  Surgeon: BrSerafina MitchellMD;  [facility];  Service: Vascular;  Laterality: Bilateral;  . LOWER EXTREMITY ANGIOGRAM Left 02/17/2020   Procedure: LOWER EXTREMITY ANGIOGRAM WITH Stenting Left Popliteal Artery, Angioplasty posterior Tibial Artery;  Surgeon: BrSerafina MitchellMD;  [facility];  Laterality: Left;  . LOWER EXTREMITY ANGIOGRAM Left 07/10/2020   Procedure: LEFT LOWER EXTREMITY ANGIOGRAM WITH INTERVENTION;  Surgeon: BrSerafina MitchellMD;  [facility]: [facility];  Laterality: Left;  . Marland KitchenASS BIOPSY Left 08/14/2019   Procedure: BIOPSY EXCISION OF LEFT NECK MASS;  Surgeon: NeRozetta NunneryMD;  [facility];  Laterality: Left;  . THROMBECTOMY FEMORAL ARTERY Bilateral 02/17/2020   Procedure: Bilateral  Iliofemoral Thrombectomy and exposure of left lower leg.;  Surgeon: BrSerafina MitchellMD;  [facility];  Service: Vascular;  Laterality: Bilateral;     Current  Outpatient Medications  Medication Sig Dispense Refill  . apixaban (ELIQUIS) 5 MG TABS tablet Take 1 tablet (5 mg total) by mouth 2 (two) times daily. 180 tablet 3  . ARIPiprazole (ABILIFY) 5 MG tablet Take 1 tablet (5 mg total) by mouth daily. 30 tablet 0  . aspirin 81 MG EC tablet Take 1 tablet (81 mg total) by mouth daily. 90 tablet 0  . atorvastatin (LIPITOR) 40 MG tablet Take 1 tablet (40 mg total) by mouth at bedtime. 90 tablet 3  . Blood Glucose Monitoring Suppl (ONE TOUCH ULTRA 2) W/DEVICE KIT Check sugar once daily. ICD10 E11.65 1 each 0  . citalopram (CELEXA) 20 MG tablet Take 1 tablet (20 mg total) by mouth 2 (two) times daily. 180 tablet 3  . clopidogrel (PLAVIX) 75 MG tablet Take 1 tablet (75 mg total) by mouth daily. 90 tablet 3  . Continuous Glucose Monitor KIT 1 kit by Does not apply route daily. 1 kit 0  . empagliflozin (JARDIANCE) 25 MG TABS tablet Take 25 mg by  mouth daily. 90 tablet 3  . ferrous sulfate 325 (65 FE) MG tablet Take 325 mg by mouth daily with breakfast.    . furosemide (LASIX) 40 MG tablet Take 1 tablet (40 mg total) by mouth daily. 30 tablet 1  . glucose blood (ONE TOUCH ULTRA TEST) test strip Check sugar once daily in the morning. ICD10 E11.65 30 each 5  . insulin glargine (LANTUS) 100 UNIT/ML Solostar Pen Inject 20 Units into the skin every morning. (Patient taking differently: Inject 5 Units into the skin daily.) 3 mL 2  . metFORMIN (GLUCOPHAGE) 1000 MG tablet Take 1,000 mg by mouth 2 (two) times daily.    . metoprolol succinate (TOPROL-XL) 25 MG 24 hr tablet Take 1 tablet (25 mg total) by mouth daily. 30 tablet 2  . Multiple Vitamins-Minerals (MULTIVITAMIN WITH MINERALS) tablet Take 1 tablet by mouth daily.    . nitroGLYCERIN (NITROSTAT) 0.4 MG SL tablet Place 1 tablet (0.4 mg total) under the tongue every 5 (five) minutes as needed for chest pain. 30 tablet 1  . ONE TOUCH LANCETS MISC Use to check sugar once daily. ICD10 E11.65 30 each 5  .  sacubitril-valsartan (ENTRESTO) 24-26 MG Take 1 tablet by mouth 2 (two) times daily. 180 tablet 3  . traZODone (DESYREL) 50 MG tablet Take 50 mg by mouth at bedtime.    . triamcinolone cream (KENALOG) 0.1 % Apply 1 application topically 2 (two) times daily. 453.6 g 0  . spironolactone (ALDACTONE) 25 MG tablet Take 0.5 tablets (12.5 mg total) by mouth daily. 45 tablet 3   No current facility-administered medications for this visit.    Allergies:   Shrimp [shellfish allergy]    ROS:  Please see the history of present illness.   Otherwise, review of systems are positive for none.   All other systems are reviewed and negative.    PHYSICAL EXAM: VS:  BP 130/90   Pulse 66   Ht 5' 11"  (1.803 m)   Wt 152 lb 12.8 oz (69.3 kg)   BMI 21.31 kg/m  , BMI Body mass index is 21.31 kg/m. GENERAL:  Well appearing NECK:  Positive jugular venous distention, waveform within normal limits, carotid upstroke brisk and symmetric, no bruits, no thyromegaly LUNGS:  Clear to auscultation bilaterally CHEST:  Unremarkable HEART:  PMI not displaced or sustained,S1 and S2 within normal limits, no S3, no S4, no clicks, no rubs, no murmurs ABD:  Flat, positive bowel sounds normal in frequency in pitch, no bruits, no rebound, no guarding, no midline pulsatile mass, no hepatomegaly, no splenomegaly EXT:  2 plus pulses upper, absent dorsalis pedis and posterior tibialis, no edema, no cyanosis no clubbing, ulcer on the 5th digit right hand, and 3rd finger left hand. Bandages intact on his right lower extremit   EKG:  EKG not ordered today. NA   Recent Labs: 07/11/2020: ALT 17 07/16/2020: BUN 11; Creatinine, Ser 0.99; Potassium 4.6; Sodium 132 07/19/2020: Hemoglobin 7.9; Platelets 300    Lipid Panel    Component Value Date/Time   CHOL 71 07/11/2020 0133   CHOL 141 01/10/2018 1419   TRIG 65 07/11/2020 0133   HDL 13 (L) 07/11/2020 0133   HDL 24 (L) 01/10/2018 1419   CHOLHDL 5.5 07/11/2020 0133   VLDL 13  07/11/2020 0133   LDLCALC 45 07/11/2020 0133   LDLCALC 72 01/10/2018 1419   LDLDIRECT 84 09/03/2011 1548      Wt Readings from Last 3 Encounters:  09/03/20 152 lb 12.8 oz (69.3  kg)  09/02/20 151 lb 4.8 oz (68.6 kg)  08/15/20 145 lb 11.2 oz (66.1 kg)      Other studies Reviewed: Additional studies/ records that were reviewed today include: Hospital records. Review of the above records demonstrates:  Please see elsewhere in the note.     ASSESSMENT AND PLAN:  Critical lower limb ischemia He is having this followed for wound management by Dr. Trula Slade.  No change in therapy.    CAD (coronary artery disease) He is having no active chest pain.  No change in therapy.  He will continue with risk reduction.  Diabetes mellitus type 2, uncontrolled (HCC) A1c 8.3.  This is much improved and I have encouraged him to follow-up with his primary care provider to continue management of this.   Cardiomyopathy ([place]) His ejection fraction was improved slightly in January up to 30 to 35%.  He will continue with meds as listed.bid.    He does have moderate tricuspid regurgitation which I will follow clinically.  His pulmonary pressures were not significantly elevated however.  Anticoagulated He will continue on Eliquis and aspirin.  Atrial fib He had brief runs of this.  He will remain on chronic anticoagulation.  He is not having any symptomatic paroxysms.   Current medicines are reviewed at length with the patient today.  The patient does not have concerns regarding medicines.  The following changes have been made:  no change  Labs/ tests ordered today include: None  No orders of the defined types were placed in this encounter.    Disposition:   FU with Kerin Ransom in two months.     Signed, Minus Breeding, MD  09/03/2020 4:01 PM    [facility]

## 2020-09-02 ENCOUNTER — Ambulatory Visit (INDEPENDENT_AMBULATORY_CARE_PROVIDER_SITE_OTHER)
Admit: 2020-09-02 | Discharge: 2020-09-02 | Disposition: A | Discharge status: Home / Self Care | Payer: Medicare Other | Attending: Surgery | Admitting: Surgery

## 2020-09-02 ENCOUNTER — Encounter: Payer: Self-pay | Admitting: Physician Assistant

## 2020-09-02 ENCOUNTER — Other Ambulatory Visit: Payer: Self-pay

## 2020-09-02 ENCOUNTER — Ambulatory Visit (HOSPITAL_COMMUNITY)
Admission: RE | Admit: 2020-09-02 | Discharge: 2020-09-02 | Disposition: A | Discharge status: Home / Self Care | Payer: Medicare Other | Source: Ambulatory Visit | Attending: Surgery | Admitting: Surgery

## 2020-09-02 ENCOUNTER — Ambulatory Visit (INDEPENDENT_AMBULATORY_CARE_PROVIDER_SITE_OTHER): Payer: Medicare Other | Admitting: Physician Assistant

## 2020-09-02 VITALS — BP 127/85 | HR 94 | Temp 98.2°F | Resp 20 | Ht 71.0 in | Wt 151.3 lb

## 2020-09-02 DIAGNOSIS — I739 Peripheral vascular disease, unspecified: Secondary | ICD-10-CM

## 2020-09-02 DIAGNOSIS — T8189XA Other complications of procedures, not elsewhere classified, initial encounter: Secondary | ICD-10-CM

## 2020-09-02 NOTE — Progress Notes (Signed)
Patient name: Paul [name]. MRN: [id number] DOB: October 11, 1970 Sex: male  REASON FOR VISIT:     post op  HISTORY OF PRESENT ILLNESS:   Paul Hoffert. is a 50 y.o. male who on 07-10-2020 underwent a RIGHT above knee to posterior tibial bypass with non-reversesd vein and drug coated balloon angioplasty to the LEFT posterior tibial artery for bilateral rest pain.  He developed wound breakdown from his vein harvest incisions.  He was started on Dakins dressing changes and is back for follow up  CURRENT MEDICATIONS:    Current Outpatient Medications  Medication Sig Dispense Refill  . apixaban (ELIQUIS) 5 MG TABS tablet Take 1 tablet (5 mg total) by mouth 2 (two) times daily. 180 tablet 3  . ARIPiprazole (ABILIFY) 5 MG tablet Take 1 tablet (5 mg total) by mouth daily. 30 tablet 0  . aspirin 81 MG EC tablet Take 1 tablet (81 mg total) by mouth daily. (Patient taking differently: Take 81 mg by mouth in the morning and at bedtime. Morning and afternoon) 90 tablet 0  . atorvastatin (LIPITOR) 40 MG tablet Take 1 tablet (40 mg total) by mouth at bedtime. 90 tablet 3  . Blood Glucose Monitoring Suppl (ONE TOUCH ULTRA 2) W/DEVICE KIT Check sugar once daily. ICD10 E11.65 1 each 0  . citalopram (CELEXA) 20 MG tablet Take 1 tablet (20 mg total) by mouth 2 (two) times daily. 180 tablet 3  . clopidogrel (PLAVIX) 75 MG tablet Take 1 tablet (75 mg total) by mouth daily. 90 tablet 3  . Continuous Glucose Monitor KIT 1 kit by Does not apply route daily. 1 kit 0  . empagliflozin (JARDIANCE) 25 MG TABS tablet Take 25 mg by mouth daily. 90 tablet 3  . ferrous sulfate 325 (65 FE) MG tablet Take 325 mg by mouth daily with breakfast.    . furosemide (LASIX) 40 MG tablet Take 1 tablet (40 mg total) by mouth daily. 30 tablet 1  . glucose blood (ONE TOUCH ULTRA TEST) test strip Check sugar once daily in the morning. ICD10 E11.65 30 each 5  . insulin glargine (LANTUS) 100  UNIT/ML Solostar Pen Inject 20 Units into the skin every morning. (Patient taking differently: Inject 5 Units into the skin daily.) 3 mL 2  . metFORMIN (GLUCOPHAGE) 1000 MG tablet Take 1,000 mg by mouth 2 (two) times daily.    . metoprolol succinate (TOPROL-XL) 25 MG 24 hr tablet Take 1 tablet (25 mg total) by mouth daily. 30 tablet 2  . Multiple Vitamins-Minerals (MULTIVITAMIN WITH MINERALS) tablet Take 1 tablet by mouth daily.    . nitroGLYCERIN (NITROSTAT) 0.4 MG SL tablet Place 1 tablet (0.4 mg total) under the tongue every 5 (five) minutes as needed for chest pain. 30 tablet 1  . ONE TOUCH LANCETS MISC Use to check sugar once daily. ICD10 E11.65 30 each 5  . oxyCODONE-acetaminophen (PERCOCET) 5-325 MG tablet Take 1 tablet by mouth every 4 (four) hours as needed for severe pain. 30 tablet 0  . sacubitril-valsartan (ENTRESTO) 24-26 MG Take 1 tablet by mouth 2 (two) times daily. 180 tablet 3  . spironolactone (ALDACTONE) 25 MG tablet Take 0.5 tablets (12.5 mg total) by mouth daily. 45 tablet 3  . traZODone (DESYREL) 50 MG tablet Take 50 mg by mouth at bedtime.    . triamcinolone cream (KENALOG) 0.1 % Apply 1 application topically 2 (two) times daily. 453.6 g 0   No current facility-administered medications for this visit.    REVIEW  OF SYSTEMS:   [X] denotes positive finding, [ ] denotes negative finding Cardiac  Comments:  Chest pain or chest pressure:    neg  Shortness of breath upon exertion:  neg  Short of breath when lying flat:  neg  Irregular heart rhythm:  neg  Constitutional    Fever or chills:  neg    PHYSICAL EXAM:   Vitals:   09/02/20 1542  BP: 127/85  Pulse: 94  Resp: 20  Temp: 98.2 F (36.8 C)  TempSrc: Temporal  SpO2: 96%  Weight: 151 lb 4.8 oz (68.6 kg)  Height: 5' 11" (1.803 m)    GENERAL: The patient is a well-nourished male, in no acute distress. The vital signs are documented above. CARDIOVASCULAR: There is a regular rate and rhythm. PULMONARY:  Non-labored respirations Left LE palpable pedal pulse  3 vein harvest sites with good base granulation tissue distal has 30-40% yellow eschar.  This was debrided with 4 x 4.  Motor intact B LE.  Tight edema in B LE.  STUDIES:     ABI Findings:  +---------+------------------+-----+--------+--------+  Right  Rt Pressure (mmHg)IndexWaveformComment   +---------+------------------+-----+--------+--------+  Brachial 129                     +---------+------------------+-----+--------+--------+  PTA   143        1.11 biphasic      +---------+------------------+-----+--------+--------+  DP    137        1.06 biphasic      +---------+------------------+-----+--------+--------+  Great Toe103        0.80           +---------+------------------+-----+--------+--------+   +---------+------------------+-----+--------+-------+  Left   Lt Pressure (mmHg)IndexWaveformComment  +---------+------------------+-----+--------+-------+  Brachial 129                     +---------+------------------+-----+--------+-------+  PTA   137        1.06 biphasic      +---------+------------------+-----+--------+-------+  DP    134        1.04 biphasic      +---------+------------------+-----+--------+-------+  Great Toe40        0.31           +---------+------------------+-----+--------+-------+   +-------+-----------+-----------+------------+------------+  ABI/TBIToday's ABIToday's TBIPrevious ABIPrevious TBI  +-------+-----------+-----------+------------+------------+  Right 1.11    0.80    0.57    0.23      +-------+-----------+-----------+------------+------------+  Left  1.06    0.31    1.13    0.88      +-------+-----------+-----------+------------+------------+     Right  ABIs and TBIs appear increased compared to prior study on  06/27/2020. Left TBIs appear decreased compared to prior study on  06/27/2020.    Summary:  Right: Resting right ankle-brachial index is within normal range. No  evidence of significant right lower extremity arterial disease. The right  toe-brachial index is normal.   Left: Resting left ankle-brachial index is within normal range. No  evidence of significant left lower extremity arterial disease. The left  toe-brachial index is abnormal.        Right Graft #1: Above knee popliteal to posterior tibial artery  +------------------+--------+--------+----------+------------------+           PSV cm/sStenosisWaveform Comments       +------------------+--------+--------+----------+------------------+  Inflow      32       monophasicLimited visibility  +------------------+--------+--------+----------+------------------+  Prox Anastomosis 64       monophasic           +------------------+--------+--------+----------+------------------+  Proximal Graft  98       monophasic           +------------------+--------+--------+----------+------------------+  Mid Graft     58       monophasic           +------------------+--------+--------+----------+------------------+  Distal Graft   111       monophasic           +------------------+--------+--------+----------+------------------+  Distal Anastomosis165       biphasic            +------------------+--------+--------+----------+------------------+  Outflow      53       biphasic            +------------------+--------+--------+----------+------------------+   Summary:  Right: Patent right AK popliteal-posterior tibial artery bypass graft with  no evidence of stenosis.   MEDICAL ISSUES:  A/P: Right  above-knee popliteal to posterior tibial artery bypass graft with nonreversed ipsilateral translocated saphenous  Drug-coated balloon angioplasty, left posterior tibial artery  His incisions have dehiscence at the vein harvest site.  Plan wet to dry dressing changes daily.  Possible wound vac placement by facility per patient.  Elevate the LE when possible in supine position for edema Ambulate 2-3 times daily with walker support. Shower daily as needed.  The patient was seen in conjunction with Dr. Trula Slade. He will f/u for wound check in 2-3 weeks( my picture from today was not save) F/U in 3 months for repeat duplex and ABI.    Leia Alf, MD, FACS [facility] [phone] Pager [phone]  Roxy Horseman PA-C

## 2020-09-03 ENCOUNTER — Encounter: Payer: Self-pay | Admitting: Cardiology

## 2020-09-03 ENCOUNTER — Ambulatory Visit (INDEPENDENT_AMBULATORY_CARE_PROVIDER_SITE_OTHER): Payer: Medicare Other | Admitting: Cardiology

## 2020-09-03 ENCOUNTER — Other Ambulatory Visit: Payer: Self-pay

## 2020-09-03 VITALS — BP 130/90 | HR 66 | Ht 71.0 in | Wt 152.8 lb

## 2020-09-03 DIAGNOSIS — E11649 Type 2 diabetes mellitus with hypoglycemia without coma: Secondary | ICD-10-CM | POA: Diagnosis not present

## 2020-09-03 DIAGNOSIS — I255 Ischemic cardiomyopathy: Secondary | ICD-10-CM

## 2020-09-03 DIAGNOSIS — I2510 Atherosclerotic heart disease of native coronary artery without angina pectoris: Secondary | ICD-10-CM | POA: Diagnosis not present

## 2020-09-03 DIAGNOSIS — I70229 Atherosclerosis of native arteries of extremities with rest pain, unspecified extremity: Secondary | ICD-10-CM | POA: Diagnosis not present

## 2020-09-03 DIAGNOSIS — I739 Peripheral vascular disease, unspecified: Secondary | ICD-10-CM

## 2020-09-03 DIAGNOSIS — I480 Paroxysmal atrial fibrillation: Secondary | ICD-10-CM | POA: Diagnosis not present

## 2020-09-03 NOTE — Patient Instructions (Signed)
Medication Instructions:  No changes *If you need a refill on your cardiac medications before your next appointment, please call your pharmacy*  Lab Work: None ordered this visit  Testing/Procedures: None ordered this visit  Follow-Up: At [facility], you and your health needs are our priority.  As part of our continuing mission to provide you with exceptional heart care, we have created designated Provider Care Teams.  These Care Teams include your primary Cardiologist (physician) and Advanced Practice Providers (APPs -  Physician Assistants and Nurse Practitioners) who all work together to provide you with the care you need, when you need it.  Your next appointment:   3 month(s)  The format for your next appointment:   In Person  Provider:   Edd Fabian, NP

## 2020-09-04 DIAGNOSIS — I739 Peripheral vascular disease, unspecified: Secondary | ICD-10-CM | POA: Diagnosis not present

## 2020-09-04 DIAGNOSIS — M6281 Muscle weakness (generalized): Secondary | ICD-10-CM | POA: Diagnosis not present

## 2020-09-04 DIAGNOSIS — Z4801 Encounter for change or removal of surgical wound dressing: Secondary | ICD-10-CM | POA: Diagnosis not present

## 2020-09-10 DIAGNOSIS — F4310 Post-traumatic stress disorder, unspecified: Secondary | ICD-10-CM | POA: Diagnosis not present

## 2020-09-10 DIAGNOSIS — F419 Anxiety disorder, unspecified: Secondary | ICD-10-CM | POA: Diagnosis not present

## 2020-09-10 DIAGNOSIS — F319 Bipolar disorder, unspecified: Secondary | ICD-10-CM | POA: Diagnosis not present

## 2020-09-10 DIAGNOSIS — I513 Intracardiac thrombosis, not elsewhere classified: Secondary | ICD-10-CM | POA: Diagnosis not present

## 2020-09-10 DIAGNOSIS — R5381 Other malaise: Secondary | ICD-10-CM | POA: Diagnosis not present

## 2020-09-10 DIAGNOSIS — I5022 Chronic systolic (congestive) heart failure: Secondary | ICD-10-CM | POA: Diagnosis not present

## 2020-09-10 DIAGNOSIS — D508 Other iron deficiency anemias: Secondary | ICD-10-CM | POA: Diagnosis not present

## 2020-09-10 DIAGNOSIS — I739 Peripheral vascular disease, unspecified: Secondary | ICD-10-CM | POA: Diagnosis not present

## 2020-09-10 DIAGNOSIS — F32A Depression, unspecified: Secondary | ICD-10-CM | POA: Diagnosis not present

## 2020-09-10 DIAGNOSIS — E119 Type 2 diabetes mellitus without complications: Secondary | ICD-10-CM | POA: Diagnosis not present

## 2020-09-11 DIAGNOSIS — I739 Peripheral vascular disease, unspecified: Secondary | ICD-10-CM | POA: Diagnosis not present

## 2020-09-11 DIAGNOSIS — M6281 Muscle weakness (generalized): Secondary | ICD-10-CM | POA: Diagnosis not present

## 2020-09-11 DIAGNOSIS — Z4801 Encounter for change or removal of surgical wound dressing: Secondary | ICD-10-CM | POA: Diagnosis not present

## 2020-09-16 ENCOUNTER — Ambulatory Visit (INDEPENDENT_AMBULATORY_CARE_PROVIDER_SITE_OTHER): Payer: Medicare Other | Admitting: Physician Assistant

## 2020-09-16 ENCOUNTER — Other Ambulatory Visit: Payer: Self-pay

## 2020-09-16 VITALS — BP 102/65 | HR 65 | Temp 98.6°F | Resp 20 | Ht 71.0 in | Wt 157.8 lb

## 2020-09-16 DIAGNOSIS — T8189XA Other complications of procedures, not elsewhere classified, initial encounter: Secondary | ICD-10-CM

## 2020-09-16 DIAGNOSIS — I739 Peripheral vascular disease, unspecified: Secondary | ICD-10-CM

## 2020-09-16 NOTE — Progress Notes (Signed)
    Postoperative Visit   History of Present Illness   Paul Sobecki. is a 50 y.o. year old male who presents for wound check status post right above-the-knee to posterior tibial artery bypass with vein as well as balloon angioplasty of the left posterior tibial artery bypass to improve outflow and for rest pain. This was done in the same day by Dr. Myra Gianotti on 07/10/2020. Postoperatively patient has had right lower extremity edema and slow to heal incisions of right lower extremity. He was temporarily on Dakin's solution however today states the greenish colored drainage has returned. He states his right foot rest pain has resolved since surgery. He also states his left heel ulceration and rest pain have resolved. He currently stays at a nursing facility.  For VQI Use Only   PRE-ADM LIVING: Home  AMB STATUS: Ambulatory with Assistance   Physical Examination   Vitals:   09/16/20 1431  BP: 102/65  Pulse: 65  Resp: 20  Temp: 98.6 F (37 C)  TempSrc: Temporal  SpO2: 96%  Weight: 157 lb 12.8 oz (71.6 kg)  Height: 5\' 11"  (1.803 m)    RLE: All 3 open incisions with granulation tissue however greenish collection on dressing; no purulence with manipulation of incisions; brisk right PT signal by Doppler   Medical Decision Making   Paul Gorby. is a 50 y.o. year old male who presents for wound check after right above-the-knee popliteal to PTA bypass with vein 2 months postop.  . Right foot is well-perfused without rest pain and with a brisk PTA signal . All incisions of right lower extremity with greenish drainage on dressing; we will try Dakin's solution wet-to-dry dressing changes daily for 2 weeks; patient can continue normal saline wet-to-dry dressing changes thereafter . We will recheck incisions in about 3 weeks . Patient will call/return office sooner with any problems or concerns   44 PA-C [facility] Office:  [phone]  Clinic MD: [phone]

## 2020-10-03 DIAGNOSIS — D508 Other iron deficiency anemias: Secondary | ICD-10-CM | POA: Diagnosis not present

## 2020-10-03 DIAGNOSIS — F32A Depression, unspecified: Secondary | ICD-10-CM | POA: Diagnosis not present

## 2020-10-03 DIAGNOSIS — R5381 Other malaise: Secondary | ICD-10-CM | POA: Diagnosis not present

## 2020-10-03 DIAGNOSIS — E119 Type 2 diabetes mellitus without complications: Secondary | ICD-10-CM | POA: Diagnosis not present

## 2020-10-03 DIAGNOSIS — F319 Bipolar disorder, unspecified: Secondary | ICD-10-CM | POA: Diagnosis not present

## 2020-10-03 DIAGNOSIS — I5022 Chronic systolic (congestive) heart failure: Secondary | ICD-10-CM | POA: Diagnosis not present

## 2020-10-03 DIAGNOSIS — I739 Peripheral vascular disease, unspecified: Secondary | ICD-10-CM | POA: Diagnosis not present

## 2020-10-03 DIAGNOSIS — F4310 Post-traumatic stress disorder, unspecified: Secondary | ICD-10-CM | POA: Diagnosis not present

## 2020-10-14 ENCOUNTER — Ambulatory Visit (INDEPENDENT_AMBULATORY_CARE_PROVIDER_SITE_OTHER): Payer: Medicare Other | Admitting: Physician Assistant

## 2020-10-14 ENCOUNTER — Other Ambulatory Visit: Payer: Self-pay

## 2020-10-14 VITALS — BP 144/100 | HR 100 | Temp 97.8°F | Resp 20 | Ht 71.0 in | Wt 157.1 lb

## 2020-10-14 DIAGNOSIS — T8131XS Disruption of external operation (surgical) wound, not elsewhere classified, sequela: Secondary | ICD-10-CM

## 2020-10-14 DIAGNOSIS — I739 Peripheral vascular disease, unspecified: Secondary | ICD-10-CM

## 2020-10-14 NOTE — Progress Notes (Signed)
POST OPERATIVE OFFICE NOTE    CC:  F/u for surgery; 3 months post-op  HPI:  This is a 50 y.o. male who is s/p right above-the-knee popliteal to PTA bypass with vein on 07/30/2020 by Dr. Trula Slade. He developed dehiscence of his saphenectomy incision and right lower leg incision. These have been treated with local wound care. His bypass has remained patent.  Resides at [facility]. States he has not taken his medications for three days because he "is busy". Denies side-effects. Says he is taking his Plavix Diabetic on insulin and oral agents. No tobacco use  Allergies  Allergen Reactions  . Shrimp [Shellfish Allergy] Swelling    tongue    Current Outpatient Medications  Medication Sig Dispense Refill  . apixaban (ELIQUIS) 5 MG TABS tablet Take 1 tablet (5 mg total) by mouth 2 (two) times daily. 180 tablet 3  . ARIPiprazole (ABILIFY) 5 MG tablet Take 1 tablet (5 mg total) by mouth daily. 30 tablet 0  . aspirin 81 MG EC tablet Take 1 tablet (81 mg total) by mouth daily. 90 tablet 0  . atorvastatin (LIPITOR) 40 MG tablet Take 1 tablet (40 mg total) by mouth at bedtime. 90 tablet 3  . Blood Glucose Monitoring Suppl (ONE TOUCH ULTRA 2) W/DEVICE KIT Check sugar once daily. ICD10 E11.65 1 each 0  . citalopram (CELEXA) 20 MG tablet Take 1 tablet (20 mg total) by mouth 2 (two) times daily. 180 tablet 3  . clopidogrel (PLAVIX) 75 MG tablet Take 1 tablet (75 mg total) by mouth daily. 90 tablet 3  . Continuous Glucose Monitor KIT 1 kit by Does not apply route daily. 1 kit 0  . empagliflozin (JARDIANCE) 25 MG TABS tablet Take 25 mg by mouth daily. 90 tablet 3  . ferrous sulfate 325 (65 FE) MG tablet Take 325 mg by mouth daily with breakfast.    . furosemide (LASIX) 40 MG tablet Take 1 tablet (40 mg total) by mouth daily. 30 tablet 1  . glucose blood (ONE TOUCH ULTRA TEST) test strip Check sugar once daily in the morning. ICD10 E11.65 30 each 5  . insulin glargine (LANTUS) 100 UNIT/ML Solostar Pen  Inject 20 Units into the skin every morning. (Patient taking differently: Inject 5 Units into the skin daily.) 3 mL 2  . metFORMIN (GLUCOPHAGE) 1000 MG tablet Take 1,000 mg by mouth 2 (two) times daily.    . metoprolol succinate (TOPROL-XL) 25 MG 24 hr tablet Take 1 tablet (25 mg total) by mouth daily. 30 tablet 2  . Multiple Vitamins-Minerals (MULTIVITAMIN WITH MINERALS) tablet Take 1 tablet by mouth daily.    . nitroGLYCERIN (NITROSTAT) 0.4 MG SL tablet Place 1 tablet (0.4 mg total) under the tongue every 5 (five) minutes as needed for chest pain. 30 tablet 1  . ONE TOUCH LANCETS MISC Use to check sugar once daily. ICD10 E11.65 30 each 5  . sacubitril-valsartan (ENTRESTO) 24-26 MG Take 1 tablet by mouth 2 (two) times daily. 180 tablet 3  . spironolactone (ALDACTONE) 25 MG tablet Take 0.5 tablets (12.5 mg total) by mouth daily. 45 tablet 3  . traZODone (DESYREL) 50 MG tablet Take 50 mg by mouth at bedtime.    . triamcinolone cream (KENALOG) 0.1 % Apply 1 application topically 2 (two) times daily. 453.6 g 0   No current facility-administered medications for this visit.     ROS:  See HPI  Vitals:   10/14/20 1423  BP: (!) 144/100  Pulse: 100  Resp:  20  Temp: 97.8 F (36.6 C)  SpO2: 91%   Physical Exam:  General appearance:Wd, WN in NAD Cardiac:regular rhythm, increased rate Respiratory: non-labored Incision:  Bilateral groin incisions well healed. Thigh and calf incisions granulating Extremities: Intact motor function and sensation of both feet. Brisk bilateral Dp and PT Doppler signals Neuro: A and O times 4       09/02/2020 ABI/TBIToday's ABIToday's TBIPrevious ABIPrevious TBI  +-------+-----------+-----------+------------+------------+  Right 1.11    0.80    0.57    0.23      +-------+-----------+-----------+------------+------------+  Left  1.06    0.31    1.13    0.88       +-------+-----------+-----------+------------+------------+  Right great toe pressure is 103.  Waveforms are biphasic Left great toe pressure is 40.  Waveforms are biphasic   Assessment/Plan:  This is a 49 y.o. male who is s/p: right above-the-knee popliteal to PTA bypass with vein. Had subsequent dehiscence of thigh and calf incisions.  These are healing and show healthy granulation tissue. Continue daily local wound care.  Recommend wet-to-dry normal saline dressings.  Discontinue Dakin's solution.  I discussed with the patient his elevated heart rate.  He states he has not taken his medication for several days.  I asked about a beta-blocker and he said he not been taking this and I explained this could be the reason he has an elevated heart rate.  He denies chest pain or shortness of breath.  I explained he needs to seek immediate medical attention should these occur and to resume his medications at once.  Follow-up in 6 weeks with ABIs and right lower extremity arterial duplex.  These have been previously arranged.  He is advised to continue aspirin, Plavix and statin as prescribed.   Risa Grill, PA-C [facility] [phone]  Clinic MD:  Donzetta Matters

## 2020-10-31 DIAGNOSIS — E119 Type 2 diabetes mellitus without complications: Secondary | ICD-10-CM | POA: Diagnosis not present

## 2020-10-31 DIAGNOSIS — I5022 Chronic systolic (congestive) heart failure: Secondary | ICD-10-CM | POA: Diagnosis not present

## 2020-10-31 DIAGNOSIS — F419 Anxiety disorder, unspecified: Secondary | ICD-10-CM | POA: Diagnosis not present

## 2020-10-31 DIAGNOSIS — I739 Peripheral vascular disease, unspecified: Secondary | ICD-10-CM | POA: Diagnosis not present

## 2020-10-31 DIAGNOSIS — I513 Intracardiac thrombosis, not elsewhere classified: Secondary | ICD-10-CM | POA: Diagnosis not present

## 2020-10-31 DIAGNOSIS — F32A Depression, unspecified: Secondary | ICD-10-CM | POA: Diagnosis not present

## 2020-10-31 DIAGNOSIS — R5381 Other malaise: Secondary | ICD-10-CM | POA: Diagnosis not present

## 2020-11-01 DIAGNOSIS — R5381 Other malaise: Secondary | ICD-10-CM | POA: Diagnosis not present

## 2020-11-01 DIAGNOSIS — F32A Depression, unspecified: Secondary | ICD-10-CM | POA: Diagnosis not present

## 2020-11-01 DIAGNOSIS — F419 Anxiety disorder, unspecified: Secondary | ICD-10-CM | POA: Diagnosis not present

## 2020-11-01 DIAGNOSIS — I739 Peripheral vascular disease, unspecified: Secondary | ICD-10-CM | POA: Diagnosis not present

## 2020-11-01 DIAGNOSIS — D508 Other iron deficiency anemias: Secondary | ICD-10-CM | POA: Diagnosis not present

## 2020-11-01 DIAGNOSIS — E119 Type 2 diabetes mellitus without complications: Secondary | ICD-10-CM | POA: Diagnosis not present

## 2020-11-01 DIAGNOSIS — F319 Bipolar disorder, unspecified: Secondary | ICD-10-CM | POA: Diagnosis not present

## 2020-11-01 DIAGNOSIS — F4310 Post-traumatic stress disorder, unspecified: Secondary | ICD-10-CM | POA: Diagnosis not present

## 2020-11-13 DIAGNOSIS — M1611 Unilateral primary osteoarthritis, right hip: Secondary | ICD-10-CM | POA: Diagnosis not present

## 2020-11-13 DIAGNOSIS — M1711 Unilateral primary osteoarthritis, right knee: Secondary | ICD-10-CM | POA: Diagnosis not present

## 2020-11-15 IMAGING — RF DG C-ARM 1-60 MIN
1 series · 5 of 5 positions shown · IV contrast (agent unspecified)
Comparison: CT angiogram same day

CLINICAL DATA: Angiography

EXAM:
DG C-ARM 1-60 MIN
CONTRAST:  See operative report.
FLUOROSCOPY TIME:  Fluoroscopy Time:  2 minutes and 56 seconds
Number of Acquired Spot Images: 5

[Series 1: run · left · 0.20mm/px · 5 of 5 slices shown]
[im 1/5]
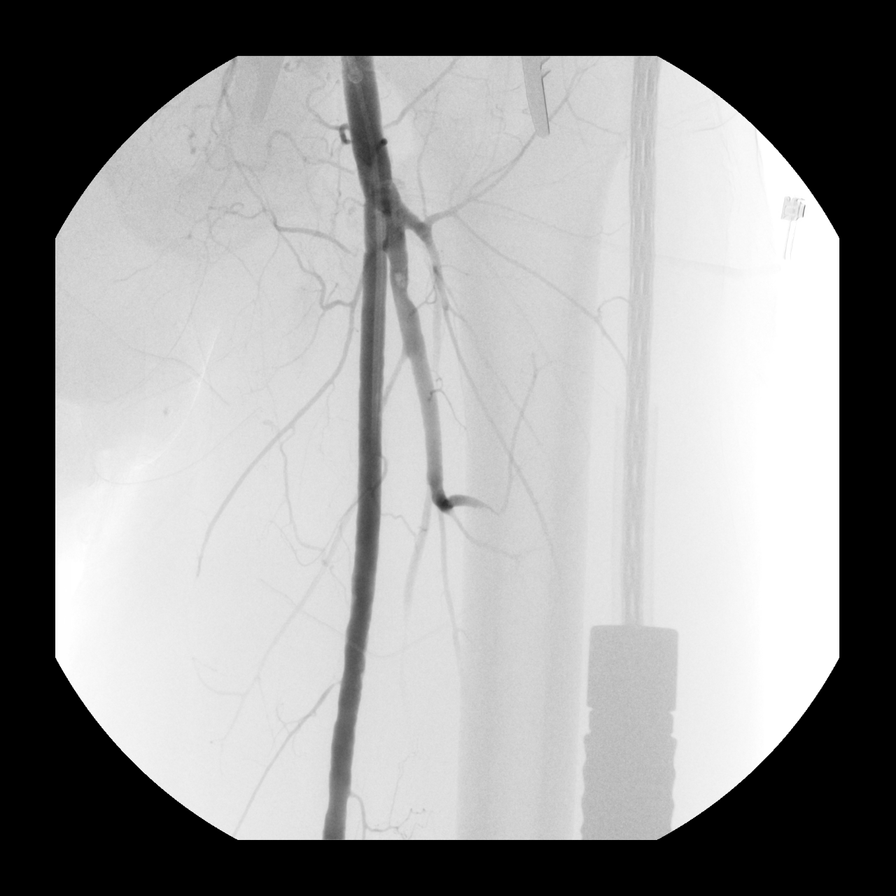
[im 2/5]
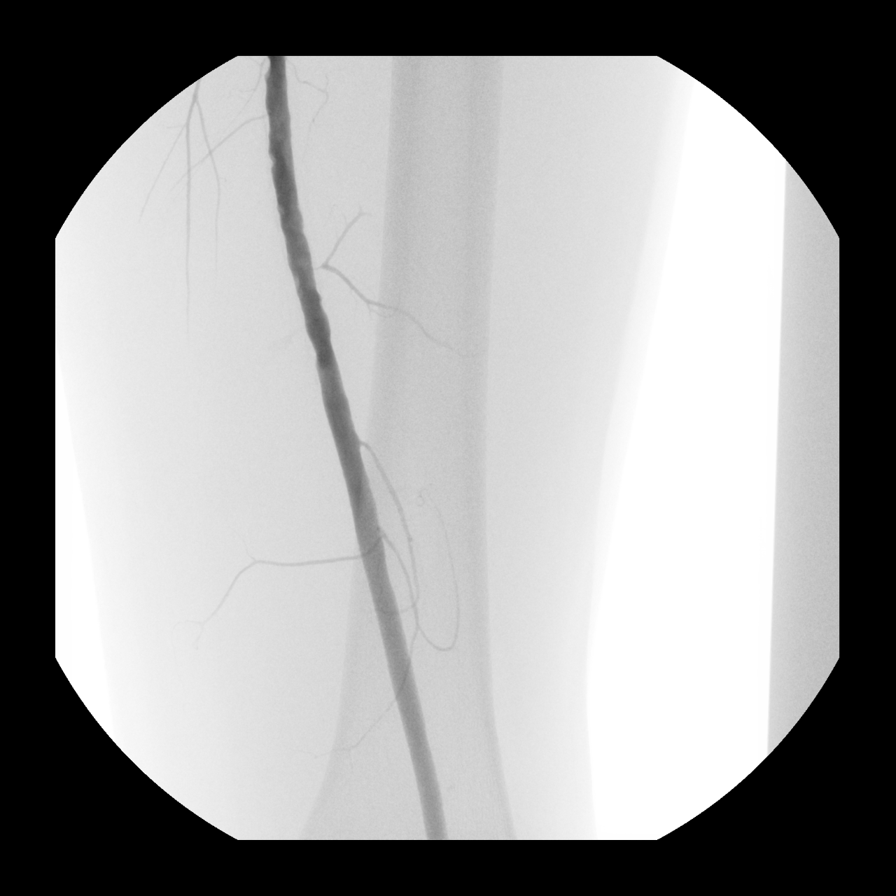
[im 3/5]
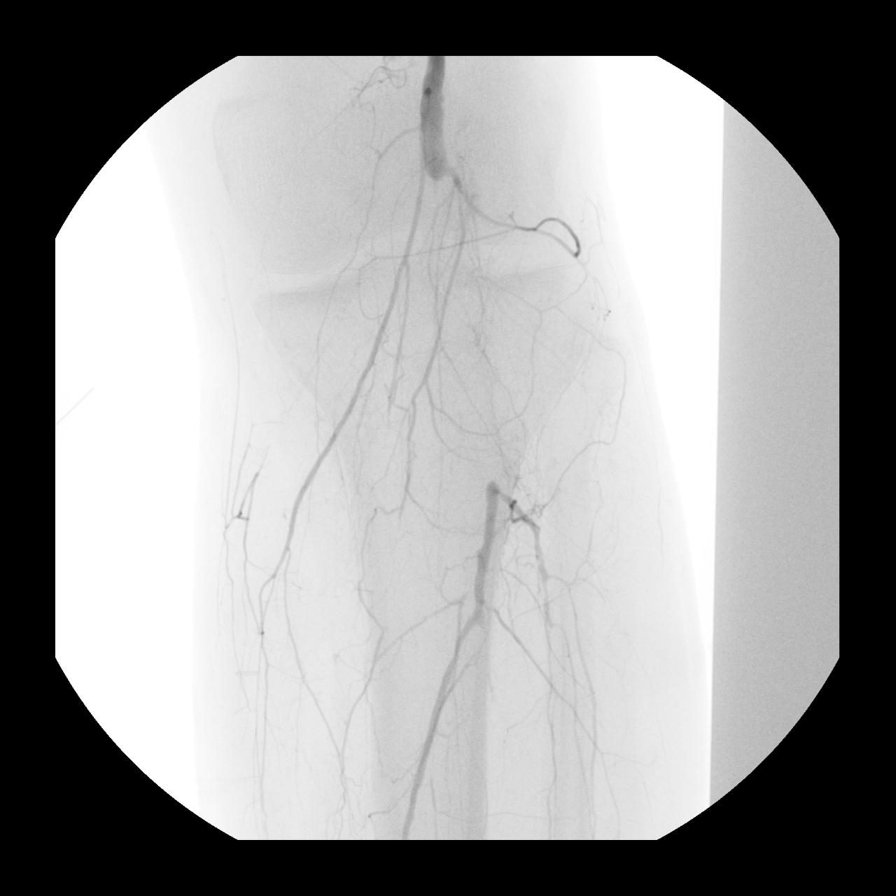
[im 4/5]
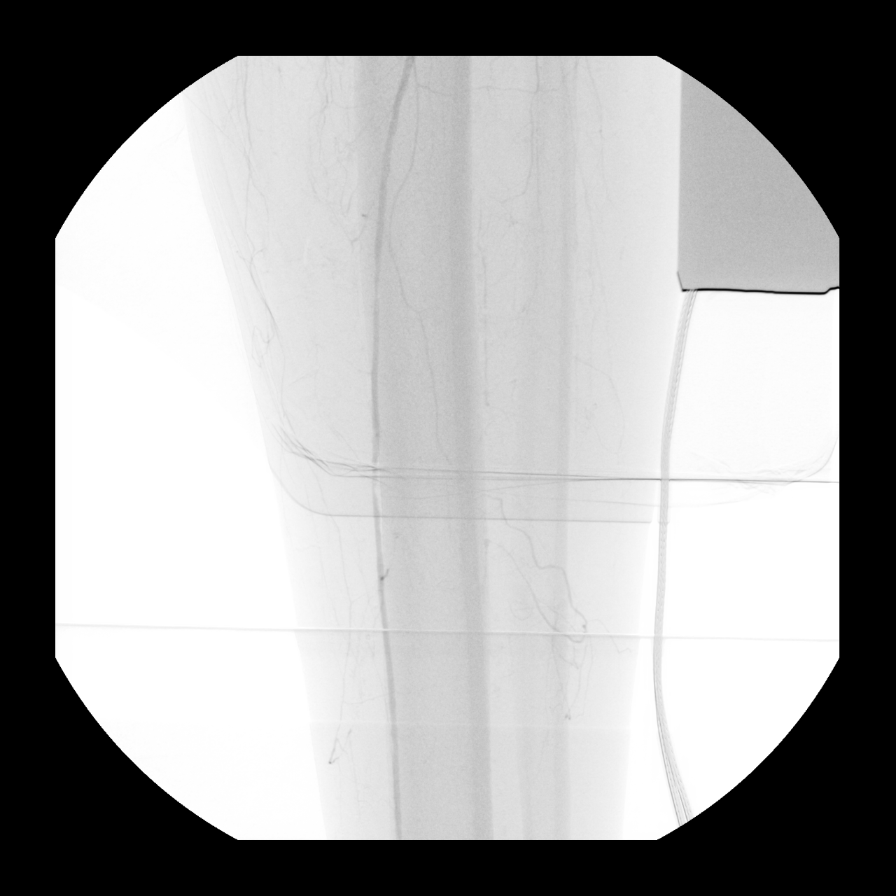
[im 5/5]
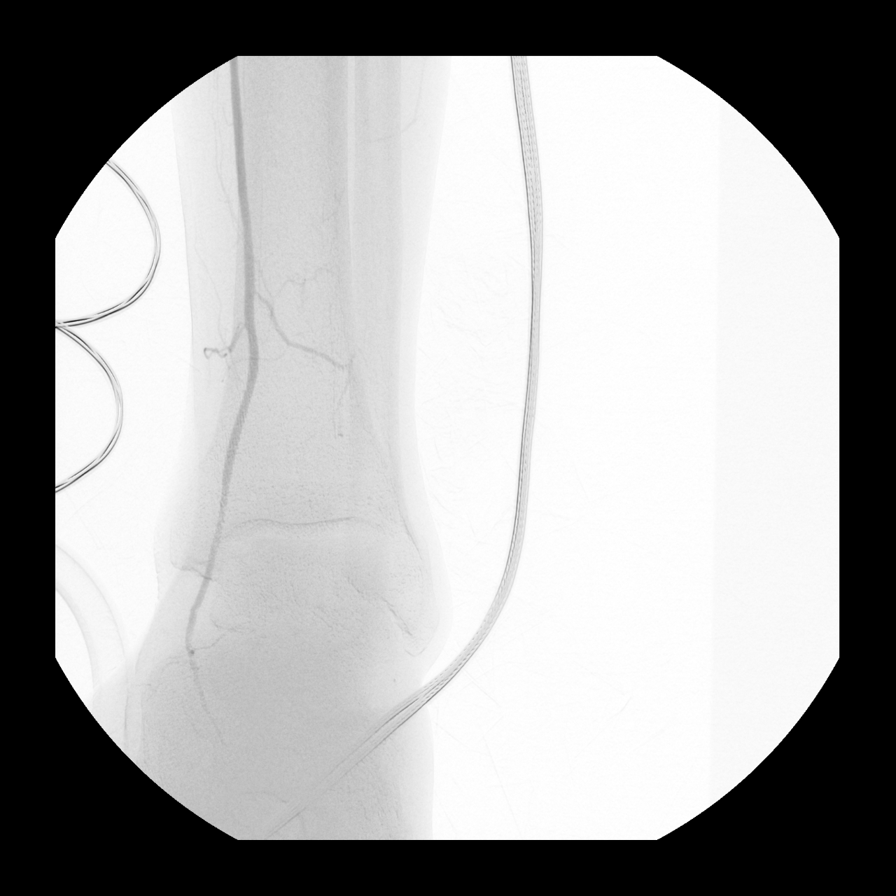

[5 of 5 positions shown; findings below may reference images not displayed]

FINDINGS: A catheter projects over the left SFA and common femoral artery.
There is a nonocclusive thrombus within the left proximal profundus
femoris artery. There are atherosclerotic changes throughout the
left SFA. There is complete occlusion of the above knee popliteal
artery with reconstitution below the knee. There appears to be a
single vessel runoff to the level of the ankle.
IMPRESSION: Angiogram as detailed above. See separate operative report for
complete details.

## 2020-11-15 IMAGING — CT CT ANGIO AOBIFEM WO/W CM
1 of 7 series · 5 of 16 positions shown, 7 images · IV contrast (OMNI 350)
Comparison: Chest CT dated 11/04/2019.

CLINICAL DATA: LEFT lower leg numbness onset yesterday.  No injury.

EXAM:
CT ANGIOGRAPHY CHEST, ABDOMEN AND PELVIS
CT ANGIOGRAPHY AORTOBIFEM WITH RUNOFF
TECHNIQUE: Multidetector CT imaging through the chest, abdomen and pelvis was
performed using the standard protocol during bolus administration of
intravenous contrast. Multiplanar reconstructed images and MIPs were
obtained and reviewed to evaluate the vascular anatomy.
Additional axial images obtained through the lower extremities to
the feet using standard protocol during bolus administration of
intravascular contrast. Multiplanar reconstructed images and MIPS
provided.
CONTRAST:  100mL OMNIPAQUE IOHEXOL 350 MG/ML SOLN

[Series 3: cta runoff (id) · axial · 0.80mm/px · z∈[+281,+1265]mm · 5 of 493 slices shown, 7 images]
[im 83/493  soft-tissue]
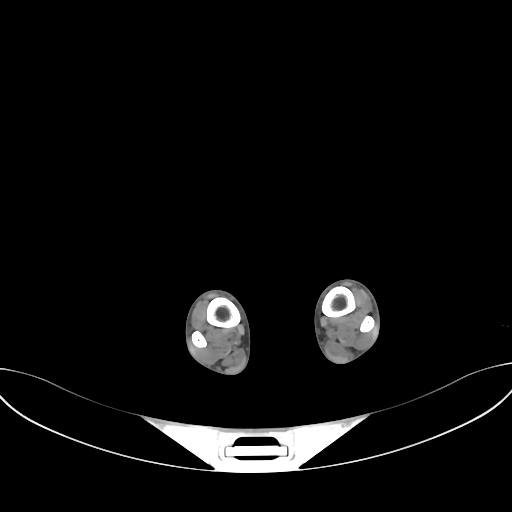
[im 83/493  bone]
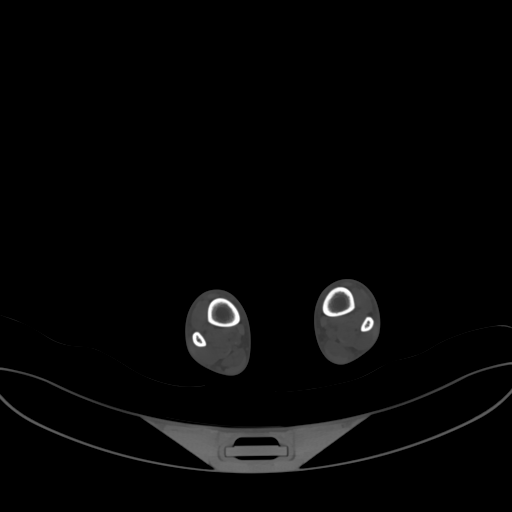
[im 165/493  soft-tissue]
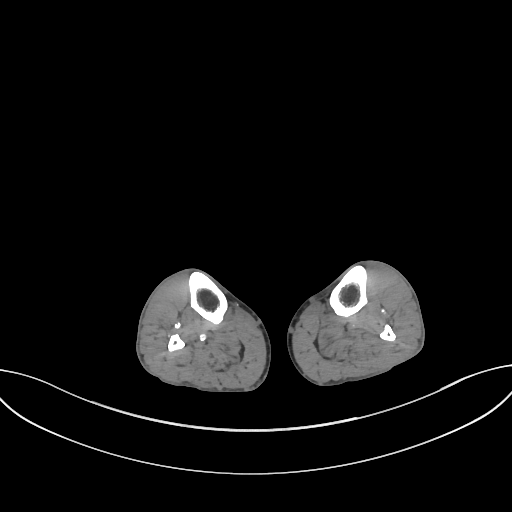
[im 247/493  soft-tissue]
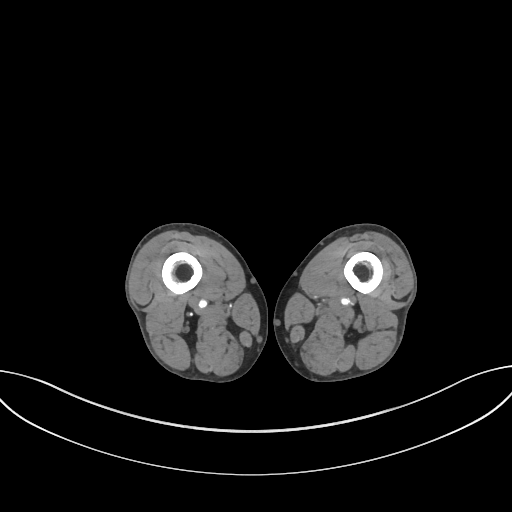
[im 329/493  soft-tissue]
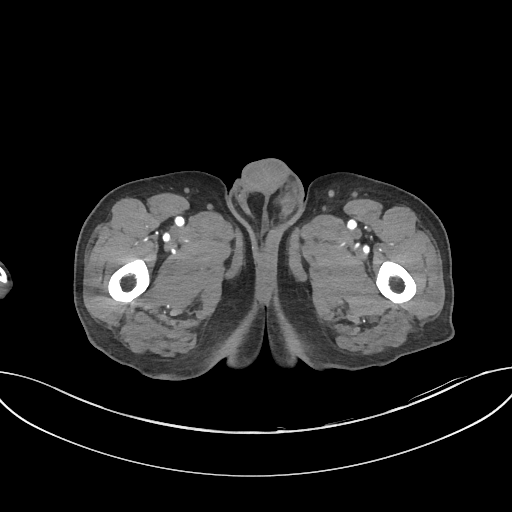
[im 411/493  soft-tissue]
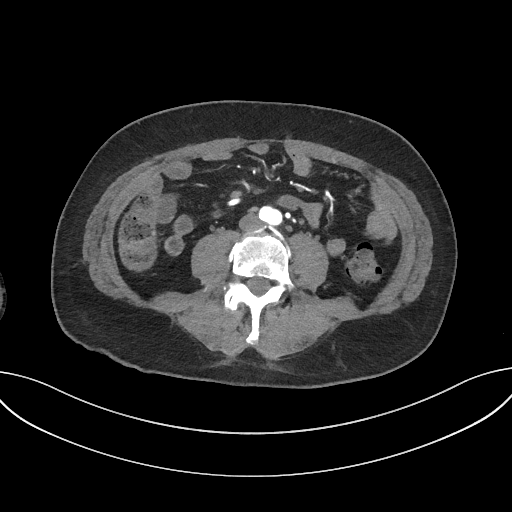
[im 411/493  bone]
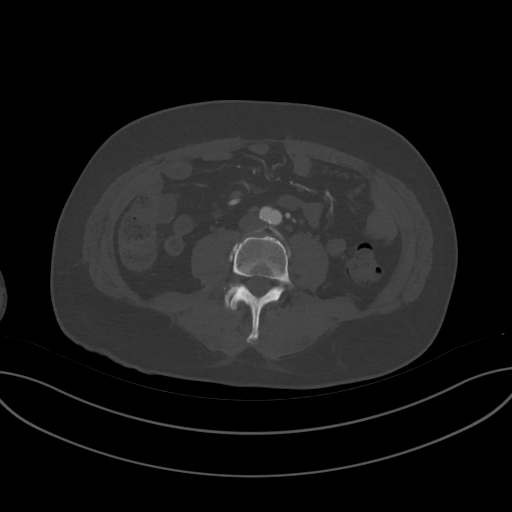

[5 of 16 positions shown; findings below may reference images not displayed]

FINDINGS: CTA CHEST FINDINGS

Cardiovascular: No thoracic aortic aneurysm or evidence of aortic
dissection. Borderline cardiomegaly. No pericardial effusion. No
pulmonary embolism is seen within the main, lobar or segmental
pulmonary arteries bilaterally. Coronary artery calcifications.

Mediastinum/Nodes: There are scattered/numerous mildly prominent
lymph nodes within the mediastinum, including a 1.1 cm short axis
lymph node in the upper aortopulmonary window region space (series
5, image 60) and conglomerate small lymph nodes within the anterior
mediastinum (series 5, image 73). Esophagus is unremarkable.
Trachea central bronchi are unremarkable.

Lungs/Pleura: Improved aeration of the lower lobes compatible with
resolved pneumonias. There is chronic appearing bibasilar fibrosis.
3 mm perifissural nodule the LEFT lower lobe (series 6, image 104),
likely small lymph node. No new lung findings. No evidence active
pneumonia. No pleural effusion or pneumothorax.

Musculoskeletal: No acute or suspicious osseous finding. Suspected
bilateral gynecomastia.

Review of the MIP images confirms the above findings.

CTA ABDOMEN AND PELVIS FINDINGS

VASCULAR

Aorta: Normal caliber aorta without aneurysm, dissection, vasculitis
or significant stenosis.

Celiac: Patent without evidence of aneurysm, dissection, vasculitis
or significant stenosis.

SMA: Patent without evidence of aneurysm, dissection, vasculitis or
significant stenosis.

Renals: Both renal arteries are patent without evidence of aneurysm,
dissection, vasculitis, fibromuscular dysplasia or significant
stenosis.

IMA: Patent without evidence of aneurysm, dissection, vasculitis or
significant stenosis.

Inflow: Atherosclerosis at the aortic bifurcation. Normal contrast
flow is seen within the bilateral common iliac external iliac
arteries. Nearly occlusive thrombosis at the level of the LEFT
common femoral artery. Normal contrast flow seen to the LEFT SFA.

Veins: No obvious venous abnormality within the limitations of this
arterial phase study.

Review of the MIP images confirms the above findings.

NON-VASCULAR

Hepatobiliary: No focal liver abnormality is seen. No gallstones,
gallbladder wall thickening, or biliary dilatation.

Pancreas: Unremarkable. No pancreatic ductal dilatation or
surrounding inflammatory changes.

Spleen: Normal in size without focal abnormality.

Adrenals/Urinary Tract: Adrenal glands are unremarkable. Hypodense
masslike lesion within the lower pole of the LEFT kidney, medial
cortex, measuring approximately 2.3 x 1.6 cm, mass versus confluent
edema.

RIGHT kidney appears normal without mass, stone hydronephrosis. No
LEFT-sided hydronephrosis. No ureteral or bladder calculi are
identified. Bladder is unremarkable.

Stomach/Bowel: No dilated large or small bowel loops. No evidence of
bowel wall inflammation. Appendix is normal. Is unremarkable,
partially decompressed.

Lymphatic: No enlarged lymph nodes appreciated within the abdomen or
pelvis.

Reproductive: Prostate gland is mildly prominent causing slight mass
effect on the bladder base.

Other: No free fluid or abscess collection is seen. No free
intraperitoneal air.

Musculoskeletal: No acute or suspicious osseous finding.

CT ANGIOGRAM RUNOFF FINDINGS

Right Lower Extremity: Focal nearly occlusive thrombosis at the
junction of the RIGHT SFA and deep profundus artery, but flow is
present throughout the RIGHT SFA. Fairly extensive atherosclerosis
of the RIGHT SFA. Normal contrast flow is seen at the level of the
upper RIGHT popliteal artery.

There is no contrast flow seen within the lower RIGHT popliteal
artery or RIGHT calf arteries. Heavy atherosclerotic changes
throughout the RIGHT calf arteries.

Left Lower Extremity: As above, nearly occlusive thrombosis at the
level of the LEFT common femoral artery extending to the junction of
the LEFT SFA and deep profundus artery. Normal contrast flow is
present throughout the LEFT SFA. Contrast flow is seen to the level
of the upper LEFT popliteal artery.

No contrast flow is seen within the lower LEFT popliteal artery or
within the LEFT calf arteries. Heavy atherosclerotic changes are
seen throughout the LEFT calf arteries.

Soft tissues of the lower extremities are unremarkable. Osseous
structures of the lower extremities are unremarkable.

Review of the MIP images confirms the above findings.
IMPRESSION: 1. Focal nearly occlusive thrombosis at the junction of the RIGHT
SFA and deep profundus artery, but normal contrast flow is present
throughout the RIGHT SFA.
2. Nearly occlusive thrombosis at the level of the LEFT common
femoral artery extending to the junction of the LEFT SFA and deep
profundus artery. Normal contrast flow is present throughout the
LEFT SFA.
3. No contrast flow is seen within the calf arteries bilaterally (no
flow is seen distal to either popliteal artery). Cannot exclude
complete occlusions at the level of both popliteal arteries, but as
this is symmetric/bilateral, this may be due to contrast bolus
timing (delayed/slow blood flow).
4. Heavy calcified atherosclerotic changes throughout the bilateral
lower extremities.
5. **An incidental finding of potential clinical significance has
been found. Masslike lesion within the lower pole of the LEFT
kidney, medial cortex, measuring 2.3 x 1.6 cm. This is highly
suspicious for neoplastic mass. Alternatively, but less likely, this
could represent edema related to pyelonephritis or infarct.
Recommend renal MRI for further characterization.**
6. Scattered/numerous small lymph nodes within the mediastinum,
pathologic by number. These could be reactive or neoplastic in
etiology.
7. Resolved bibasilar pneumonia compared to previous chest CT of
11/04/2019.
8. Chronic pulmonary fibrosis at the bilateral lung bases, moderate
in degree.

Critical Value/emergent results, including LEFT renal findings, were
called by telephone at the time of interpretation on 02/17/2020 at
[DATE] to provider BORJA HUGUET , who verbally acknowledged
these results.

## 2020-11-19 DIAGNOSIS — Z125 Encounter for screening for malignant neoplasm of prostate: Secondary | ICD-10-CM | POA: Diagnosis not present

## 2020-11-25 ENCOUNTER — Ambulatory Visit (INDEPENDENT_AMBULATORY_CARE_PROVIDER_SITE_OTHER): Payer: Medicare Other | Admitting: Surgery

## 2020-11-25 ENCOUNTER — Ambulatory Visit (HOSPITAL_COMMUNITY)
Admission: RE | Admit: 2020-11-25 | Discharge: 2020-11-25 | Disposition: A | Discharge status: Home / Self Care | Payer: Medicare Other | Source: Ambulatory Visit | Attending: Surgery | Admitting: Surgery

## 2020-11-25 ENCOUNTER — Encounter: Payer: Self-pay | Admitting: Surgery

## 2020-11-25 ENCOUNTER — Ambulatory Visit (INDEPENDENT_AMBULATORY_CARE_PROVIDER_SITE_OTHER)
Admission: RE | Admit: 2020-11-25 | Discharge: 2020-11-25 | Disposition: A | Discharge status: Home / Self Care | Payer: Medicare Other | Source: Ambulatory Visit | Attending: Surgery | Admitting: Surgery

## 2020-11-25 ENCOUNTER — Other Ambulatory Visit: Payer: Self-pay

## 2020-11-25 VITALS — BP 132/97 | HR 96 | Resp 16 | Ht 71.0 in | Wt 165.0 lb

## 2020-11-25 DIAGNOSIS — I739 Peripheral vascular disease, unspecified: Secondary | ICD-10-CM

## 2020-11-25 DIAGNOSIS — I7025 Atherosclerosis of native arteries of other extremities with ulceration: Secondary | ICD-10-CM

## 2020-11-25 DIAGNOSIS — I255 Ischemic cardiomyopathy: Secondary | ICD-10-CM

## 2020-11-25 NOTE — Progress Notes (Signed)
[facility] [place]  Patient name: Paul [name]. MRN: [id number] DOB: [birth year] Sex: male   REASON FOR VISIT:    Follow up  HISOTRY OF PRESENT ILLNESS:    Paul [name]. is a 50 y.o. male.  I met in July 2021 when he presented with bilateral ischemic legs.  He went to the operating room on 02/17/2020, and I performed bilateral thrombectomy through a femoral approach.  He did require a below-knee left popliteal exposure as well as left leg fasciotomy.  On 02/17/2020 he had recurrent left leg ischemia and a left popliteal stent was placed.  Later in 2021 he began having bilateral rest pain.  Angiography revealed high-grade left popliteal stenosis and occluded right popliteal artery.  On 07/10/2020 he underwent a right above-knee popliteal to posterior tibial bypass graft with saphenous vein and drug-coated balloon angioplasty of the left posterior tibial artery.  He developed wound breakdown from his vein harvest incisions  He resides in a facility.  They are performing dressing changes.  He is having trouble walking likely from being sedentary  Patient has a history of coronary artery disease, status post stenting in 2007.  He has recently had a syncopal episode he had stopped taking his Plavix.  Patient had Covid back in March.  He is bipolar.  He is a diabetic.  He is a non-smoker.  He takes a statin for hypercholesterolemia. PAST MEDICAL HISTORY:   Past Medical History:  Diagnosis Date  . Anxiety   . Bipolar disorder ([place])   . CAD (coronary artery disease)    Anterior MI 2007 with DES stenting of an occluded LAD, distal 70% stenosis, OM 60% stenosis, circumflex 90% stenosis treated with a drug-eluting stent, PA 90% stenosis treated medically.  Cath January 2013 70-80% diagonal stenosis in a small vessel, OM 50% stenosis, PDA long 75% stenosis. EF 45%. He was managed medically.  . Cardiomyopathy, ischemic    EF 45%   . COVID-26 February 2020   . Diabetes mellitus   . History of chest pain 11/25/2010  . Hyperlipidemia   . Hypertension   . Myocardial infarction ([place])   . PTSD (post-traumatic stress disorder)   . PVD (peripheral vascular disease) ([place])   . Tuberculosis    1989, took pills for it      FAMILY HISTORY:   Family History  Problem Relation Age of Onset  . Hypertension Mother   . Hypertension Father   . Diabetes Maternal Aunt   . Diabetes Maternal Uncle   . Cancer Maternal Grandmother     SOCIAL HISTORY:   Social History   Tobacco Use  . Smoking status: Never Smoker  . Smokeless tobacco: Never Used  Substance Use Topics  . Alcohol use: No     ALLERGIES:   Allergies  Allergen Reactions  . Shrimp [Shellfish Allergy] Swelling    tongue     CURRENT MEDICATIONS:   Current Outpatient Medications  Medication Sig Dispense Refill  . apixaban (ELIQUIS) 5 MG TABS tablet Take 1 tablet (5 mg total) by mouth 2 (two) times daily. 180 tablet 3  . ARIPiprazole (ABILIFY) 5 MG tablet Take 1 tablet (5 mg total) by mouth daily. 30 tablet 0  . aspirin 81 MG EC tablet Take 1 tablet (81 mg total) by mouth daily. 90 tablet 0  . atorvastatin (LIPITOR) 40 MG tablet Take 1 tablet (40 mg total) by mouth at bedtime. 90 tablet 3  . Blood Glucose Monitoring  Suppl (ONE TOUCH ULTRA 2) W/DEVICE KIT Check sugar once daily. ICD10 E11.65 1 each 0  . citalopram (CELEXA) 20 MG tablet Take 1 tablet (20 mg total) by mouth 2 (two) times daily. 180 tablet 3  . clopidogrel (PLAVIX) 75 MG tablet Take 1 tablet (75 mg total) by mouth daily. 90 tablet 3  . Continuous Glucose Monitor KIT 1 kit by Does not apply route daily. 1 kit 0  . empagliflozin (JARDIANCE) 25 MG TABS tablet Take 25 mg by mouth daily. 90 tablet 3  . ferrous sulfate 325 (65 FE) MG tablet Take 325 mg by mouth daily with breakfast.    . furosemide (LASIX) 40 MG tablet Take 1 tablet (40 mg total) by mouth daily. 30 tablet 1  . glucose blood  (ONE TOUCH ULTRA TEST) test strip Check sugar once daily in the morning. ICD10 E11.65 30 each 5  . insulin glargine (LANTUS) 100 UNIT/ML Solostar Pen Inject 20 Units into the skin every morning. (Patient taking differently: Inject 5 Units into the skin daily.) 3 mL 2  . metFORMIN (GLUCOPHAGE) 1000 MG tablet Take 1,000 mg by mouth 2 (two) times daily.    . metoprolol succinate (TOPROL-XL) 25 MG 24 hr tablet Take 1 tablet (25 mg total) by mouth daily. 30 tablet 2  . Multiple Vitamins-Minerals (MULTIVITAMIN WITH MINERALS) tablet Take 1 tablet by mouth daily.    . nitroGLYCERIN (NITROSTAT) 0.4 MG SL tablet Place 1 tablet (0.4 mg total) under the tongue every 5 (five) minutes as needed for chest pain. 30 tablet 1  . ONE TOUCH LANCETS MISC Use to check sugar once daily. ICD10 E11.65 30 each 5  . sacubitril-valsartan (ENTRESTO) 24-26 MG Take 1 tablet by mouth 2 (two) times daily. 180 tablet 3  . spironolactone (ALDACTONE) 25 MG tablet Take 0.5 tablets (12.5 mg total) by mouth daily. 45 tablet 3  . triamcinolone cream (KENALOG) 0.1 % Apply 1 application topically 2 (two) times daily. 453.6 g 0  . traZODone (DESYREL) 50 MG tablet Take 50 mg by mouth at bedtime. (Patient not taking: Reported on 11/25/2020)     No current facility-administered medications for this visit.    REVIEW OF SYSTEMS:   _0  denotes positive finding, _1  denotes negative finding Cardiac  Comments:  Chest pain or chest pressure:    Shortness of breath upon exertion:    Short of breath when lying flat:    Irregular heart rhythm:        Vascular    Pain in calf, thigh, or hip brought on by ambulation:    Pain in feet at night that wakes you up from your sleep:     Blood clot in your veins:    Leg swelling:         Pulmonary    Oxygen at home:    Productive cough:     Wheezing:         Neurologic    Sudden weakness in arms or legs:     Sudden numbness in arms or legs:     Sudden onset of difficulty speaking or slurred  speech:    Temporary loss of vision in one eye:     Problems with dizziness:         Gastrointestinal    Blood in stool:     Vomited blood:         Genitourinary    Burning when urinating:     Blood in urine:  Psychiatric    Major depression:         Hematologic    Bleeding problems:    Problems with blood clotting too easily:        Skin    Rashes or ulcers:        Constitutional    Fever or chills:      PHYSICAL EXAM:   Vitals:   11/25/20 1349  BP: (!) 132/97  Pulse: 96  Resp: 16  Weight: 165 lb (74.8 kg)  Height: _0  (1.803 m)    GENERAL: The patient is a well-nourished male, in no acute distress. The vital signs are documented above. CARDIAC: There is a regular rate and rhythm.  VASCULAR: I could not palpate pedal pulses PULMONARY: Non-labored respirations ABDOMEN: Soft and non-tender with normal pitched bowel sounds.  MUSCULOSKELETAL: There are no major deformities or cyanosis. NEUROLOGIC: No focal weakness or paresthesias are detected. SKIN: Wounds are slowly closing PSYCHIATRIC: The patient has a normal affect.  STUDIES:   I have reviewed the following: +-------+------------+-----------+------------+------------+  Right not obtained0.45    1.11    0.80      +-------+------------+-----------+------------+------------+  Left  1.10    0.34    1.06    0.31      +-------+------------+-----------+------------+------------+  Bypass graft MEDICAL ISSUES:   Right leg bypass and left leg stent or patent today.  Limited evaluation given the dressings on his leg.  I have him scheduled to follow-up again in 3 months with repeat ABIs and bilateral lower extremity duplex.  Hopefully this time his wounds will have healed and we can get a better exam.  In the meantime, he is going to continue wound care.  I suspect that his wound will have healed when he returns.    Leia Alf, MD, FACS [facility] [phone] Pager [phone]

## 2020-11-29 ENCOUNTER — Other Ambulatory Visit: Payer: Self-pay

## 2020-11-29 DIAGNOSIS — I7025 Atherosclerosis of native arteries of other extremities with ulceration: Secondary | ICD-10-CM

## 2020-11-29 DIAGNOSIS — E119 Type 2 diabetes mellitus without complications: Secondary | ICD-10-CM | POA: Diagnosis not present

## 2020-11-29 DIAGNOSIS — I5022 Chronic systolic (congestive) heart failure: Secondary | ICD-10-CM | POA: Diagnosis not present

## 2020-11-29 DIAGNOSIS — D508 Other iron deficiency anemias: Secondary | ICD-10-CM | POA: Diagnosis not present

## 2020-12-02 NOTE — Progress Notes (Deleted)
Cardiology Clinic Note   Patient Name: Paul [name]. Date of Encounter: 12/02/2020  Primary Care Provider:  Cleophas Dunker, DO Primary Cardiologist:  Minus Breeding, MD  Patient Profile    Paul [name], Paul [name]. 50 year old male presents the clinic today for follow-up evaluation of his PAD and essential hypertension.  Past Medical History    Past Medical History:  Diagnosis Date  . Anxiety   . Bipolar disorder ([place])   . CAD (coronary artery disease)    Anterior MI 2007 with DES stenting of an occluded LAD, distal 70% stenosis, OM 60% stenosis, circumflex 90% stenosis treated with a drug-eluting stent, PA 90% stenosis treated medically.  Cath January 2013 70-80% diagonal stenosis in a small vessel, OM 50% stenosis, PDA long 75% stenosis. EF 45%. He was managed medically.  . Cardiomyopathy, ischemic    EF 45%  . COVID-26 February 2020   . Diabetes mellitus   . History of chest pain 11/25/2010  . Hyperlipidemia   . Hypertension   . Myocardial infarction ([place])   . PTSD (post-traumatic stress disorder)   . PVD (peripheral vascular disease) ([place])   . Tuberculosis    1989, took pills for it    Past Surgical History:  Procedure Laterality Date  . ABDOMINAL AORTAGRAM  07/09/2020   LOWER EXTREMITY  . ABDOMINAL AORTOGRAM W/LOWER EXTREMITY Right 07/09/2020   Procedure: ABDOMINAL AORTOGRAM W/LOWER EXTREMITY;  Surgeon: Serafina Mitchell, MD;  Location: [place] [facility];  Service: Cardiovascular;  Laterality: Right;  . ANGIOPLASTY ILLIAC ARTERY Left 07/10/2020   Procedure: BALLOON ANGIOPLASTY OF LEFT POPLITEAL USING RANGER 4.73m X 485mOVER-THE-WIRE PTA BALLOON;  Surgeon: BrSerafina MitchellMD;  [facility] Service: Vascular;  Laterality: Left;  . APPLICATION OF WOUND VAC Left 02/17/2020   Procedure: APPLICATION OF WOUND VAC;  Surgeon: BrSerafina MitchellMD;  [facility]: [facility];  Laterality: Left;  . Catherization  08/25/11   Dr. HoPercival Spanish. CORONARY  ANGIOPLASTY WITH STENT PLACEMENT  207, 2011   4 stents placed  . DIRECT LARYNGOSCOPY Left 08/14/2019   Procedure: DIRECT LARYNGOSCOPY;  Surgeon: NeRozetta NunneryMD;  [facility];  Laterality: Left;  . FASCIOTOMY Left 02/17/2020   Procedure: Four Compartment FASCIOTOMY;  Surgeon: BrSerafina MitchellMD;  [facility]: [facility];  Laterality: Left;  . FASCIOTOMY CLOSURE Left 02/17/2020   Procedure: FASCIOTOMY CLOSURE;  Surgeon: BrSerafina MitchellMD;  [facility]: [facility];  Laterality: Left;  . FEMORAL-POPLITEAL BYPASS GRAFT Right 07/10/2020   Procedure: RIGHT ABOVE KNEE POPLITEAL TO POSTERIAL TIBIAL ARTERY BYPASS GRAFT;  Surgeon: BrSerafina MitchellMD;  [facility] Service: Vascular;  Laterality: Right;  . LEFT HEART CATHETERIZATION WITH CORONARY ANGIOGRAM N/A 08/25/2011   Procedure: LEFT HEART CATHETERIZATION WITH CORONARY ANGIOGRAM;  Surgeon: JaMinus BreedingMD;  [facility];  Service: Cardiovascular;  Laterality: N/A;  . LOWER EXTREMITY ANGIOGRAM Bilateral 02/17/2020   Procedure: LOWER EXTREMITY ANGIOGRAM;  Surgeon: BrSerafina MitchellMD;  [facility];  Service: Vascular;  Laterality: Bilateral;  . LOWER EXTREMITY ANGIOGRAM Left 02/17/2020   Procedure: LOWER EXTREMITY ANGIOGRAM WITH Stenting Left Popliteal Artery, Angioplasty posterior Tibial Artery;  Surgeon: BrSerafina MitchellMD;  [facility];  Laterality: Left;  . LOWER EXTREMITY ANGIOGRAM Left 07/10/2020   Procedure: LEFT LOWER EXTREMITY ANGIOGRAM WITH INTERVENTION;  Surgeon: BrSerafina MitchellMD;  Location:  [facility];  Service: Vascular;  Laterality: Left;  Marland Kitchen MASS BIOPSY Left 08/14/2019   Procedure: BIOPSY EXCISION OF LEFT NECK MASS;  Surgeon: Rozetta Nunnery, MD;  Location: [place];  Service: ENT;  Laterality: Left;  . THROMBECTOMY FEMORAL ARTERY Bilateral 02/17/2020   Procedure: Bilateral  Iliofemoral Thrombectomy and exposure of left lower leg.;  Surgeon:  Serafina Mitchell, MD;  Location: [facility] OR;  Service: Vascular;  Laterality: Bilateral;    Allergies  Allergies  Allergen Reactions  . Shrimp [Shellfish Allergy] Swelling    tongue    History of Present Illness  Paul [name] has a PMH of coronary artery disease, PAD, uncontrolled type 2 diabetes, and paroxysmal atrial fibrillation.  He had a MI in 2007 was treated with LAD and circumflex DES.  He underwent cardiac catheterization 2011 and again in 2013 which showed patent stents with moderate residual coronary artery disease.  He was noted to have decreased EF 45- 40% via echo 2019.  He was admitted to the hospital 7/21 with leg weakness numbness, and cool extremities.  He was found to have critical limb ischemia.  CTA showed nearly occlusive thrombus within bilateral femoral arteries.  Dr. Trula Slade was consulted and performed urgent bilateral iliofemoral thrombectomy with left lower extremity fasciotomy.  However, revascularization and return of neurological function on the left was not achieved.  He returned to the OR for successful recannulization of his left popliteal artery, posterior tubular artery, and closure of his left lower extremity fasciotomy.  An echocardiogram during that time showed an EF of 25% with apical akinesis.  It was felt that he had a apical thrombus that caused his lower extremity embolism.  After surgery he was placed on aspirin Plavix, and apixaban.  He followed up with Dr. Trula Slade 02/29/2020.  His lower extremity Dopplers were negative for DVT.  His lower extremity arterial Doppler showed some decrease in his left lower extremity circulation ABI was 0.64.  During his follow-up with Dr. Percival Spanish he reported he was staying at [facility].  He was having dressing changes for a right leg wound.  He continued to drive but walks slowly due to his wound.  He denied chest pain, arm and neck discomfort.  He denied shortness of breath, PND, and orthopnea.  He was seen by Dr. Trula Slade  11/25/2020 and underwent evaluation of his lower extremities.  It was noted that his right leg bypass, and left leg stent were both patent.  Follow-up with repeat ABIs rescheduled for 3 months.  He presents the clinic today for follow-up evaluation states***  *** denies chest pain, shortness of breath, lower extremity edema, fatigue, palpitations, melena, hematuria, hemoptysis, diaphoresis, weakness, presyncope, syncope, orthopnea, and PND.  Home Medications    Prior to Admission medications   Medication Sig Start Date End Date Taking? Authorizing Provider  apixaban (ELIQUIS) 5 MG TABS tablet Take 1 tablet (5 mg total) by mouth 2 (two) times daily. 03/12/20   Erlene Quan, PA-C  ARIPiprazole (ABILIFY) 5 MG tablet Take 1 tablet (5 mg total) by mouth daily. 05/22/20   Meccariello, Bernita Raisin, DO  aspirin 81 MG EC tablet Take 1 tablet (81 mg total) by mouth daily. 06/14/20   Erlene Quan, PA-C  atorvastatin (LIPITOR) 40 MG tablet Take 1 tablet (40 mg total) by mouth at bedtime. 03/12/20   Erlene Quan, PA-C  Blood Glucose Monitoring Suppl (ONE TOUCH ULTRA 2) W/DEVICE KIT Check sugar once daily. ICD10 E11.65 01/24/15   Leone Brand,  MD  citalopram (CELEXA) 20 MG tablet Take 1 tablet (20 mg total) by mouth 2 (two) times daily. 10/26/19   Nuala Alpha, DO  clopidogrel (PLAVIX) 75 MG tablet Take 1 tablet (75 mg total) by mouth daily. 03/12/20   Erlene Quan, PA-C  Continuous Glucose Monitor KIT 1 kit by Does not apply route daily. 02/20/19   Caroline More, DO  empagliflozin (JARDIANCE) 25 MG TABS tablet Take 25 mg by mouth daily. 12/01/19   Nuala Alpha, DO  ferrous sulfate 325 (65 FE) MG tablet Take 325 mg by mouth daily with breakfast.    [provider]  furosemide (LASIX) 40 MG tablet Take 1 tablet (40 mg total) by mouth daily. 05/03/20 05/03/21  Fransico Meadow, PA-C  glucose blood (ONE TOUCH ULTRA TEST) test strip Check sugar once daily in the morning. ICD10 E11.65 01/10/18   Mikell,  Jeani Sow, MD  insulin glargine (LANTUS) 100 UNIT/ML Solostar Pen Inject 20 Units into the skin every morning. Patient taking differently: Inject 5 Units into the skin daily. 05/06/20   Meccariello, Bernita Raisin, DO  metFORMIN (GLUCOPHAGE) 1000 MG tablet Take 1,000 mg by mouth 2 (two) times daily. 11/21/19   [provider]  metoprolol succinate (TOPROL-XL) 25 MG 24 hr tablet Take 1 tablet (25 mg total) by mouth daily. 07/19/20   Baglia, Corrina, PA-C  Multiple Vitamins-Minerals (MULTIVITAMIN WITH MINERALS) tablet Take 1 tablet by mouth daily.    [provider]  nitroGLYCERIN (NITROSTAT) 0.4 MG SL tablet Place 1 tablet (0.4 mg total) under the tongue every 5 (five) minutes as needed for chest pain. 10/07/19   Nuala Alpha, DO  ONE TOUCH LANCETS MISC Use to check sugar once daily. ICD10 E11.65 01/09/15   Willeen Niece, MD  sacubitril-valsartan (ENTRESTO) 24-26 MG Take 1 tablet by mouth 2 (two) times daily. 03/12/20   Erlene Quan, PA-C  spironolactone (ALDACTONE) 25 MG tablet Take 0.5 tablets (12.5 mg total) by mouth daily. 03/12/20   Erlene Quan, PA-C  traZODone (DESYREL) 50 MG tablet Take 50 mg by mouth at bedtime. Patient not taking: Reported on 11/25/2020    [provider]  triamcinolone cream (KENALOG) 0.1 % Apply 1 application topically 2 (two) times daily. 08/01/19   Sherene Sires, DO    Family History    Family History  Problem Relation Age of Onset  . Hypertension Mother   . Hypertension Father   . Diabetes Maternal Aunt   . Diabetes Maternal Uncle   . Cancer Maternal Grandmother    He indicated that his mother is deceased. He indicated that his father is deceased. He indicated that the status of his maternal grandmother is unknown. He indicated that the status of his maternal aunt is unknown. He indicated that the status of his maternal uncle is unknown.  Social History    Social History   Socioeconomic History  . Marital status: Single    Spouse  name: Not on file  . Number of children: Not on file  . Years of education: Not on file  . Highest education level: Not on file  Occupational History  . Not on file  Tobacco Use  . Smoking status: Never Smoker  . Smokeless tobacco: Never Used  Vaping Use  . Vaping Use: Never used  Substance and Sexual Activity  . Alcohol use: No  . Drug use: No  . Sexual activity: Not on file  Other Topics Concern  . Not on file  Social History  Narrative  . Not on file   Social Determinants of Health   Financial Resource Strain: Medium Risk  . Difficulty of Paying Living Expenses: Somewhat hard  Food Insecurity: Food Insecurity Present  . Worried About Charity fundraiser in the Last Year: Often true  . Ran Out of Food in the Last Year: Often true  Transportation Needs: No Transportation Needs  . Lack of Transportation (Medical): No  . Lack of Transportation (Non-Medical): No  Physical Activity: Not on file  Stress: Not on file  Social Connections: Not on file  Intimate Partner Violence: Not on file     Review of Systems    General:  No chills, fever, night sweats or weight changes.  Cardiovascular:  No chest pain, dyspnea on exertion, edema, orthopnea, palpitations, paroxysmal nocturnal dyspnea. Dermatological: No rash, lesions/masses Respiratory: No cough, dyspnea Urologic: No hematuria, dysuria Abdominal:   No nausea, vomiting, diarrhea, bright red blood per rectum, melena, or hematemesis Neurologic:  No visual changes, wkns, changes in mental status. All other systems reviewed and are otherwise negative except as noted above.  Physical Exam    VS:  There were no vitals taken for this visit. , BMI There is no height or weight on file to calculate BMI. GEN: Well nourished, well developed, in no acute distress. HEENT: normal. Neck: Supple, no JVD, carotid bruits, or masses. Cardiac: RRR, no murmurs, rubs, or gallops. No clubbing, cyanosis, edema.  Radials/DP/PT 2+ and equal  bilaterally.  Respiratory:  Respirations regular and unlabored, clear to auscultation bilaterally. GI: Soft, nontender, nondistended, BS + x 4. MS: no deformity or atrophy. Skin: warm and dry, no rash. Neuro:  Strength and sensation are intact. Psych: Normal affect.  Accessory Clinical Findings    Recent Labs: 07/11/2020: ALT 17 07/16/2020: BUN 11; Creatinine, Ser 0.99; Potassium 4.6; Sodium 132 07/19/2020: Hemoglobin 7.9; Platelets 300   Recent Lipid Panel    Component Value Date/Time   CHOL 71 07/11/2020 0133   CHOL 141 01/10/2018 1419   TRIG 65 07/11/2020 0133   HDL 13 (L) 07/11/2020 0133   HDL 24 (L) 01/10/2018 1419   CHOLHDL 5.5 07/11/2020 0133   VLDL 13 07/11/2020 0133   LDLCALC 45 07/11/2020 0133   LDLCALC 72 01/10/2018 1419   LDLDIRECT 84 09/03/2011 1548    ECG personally reviewed by me today- *** - No acute changes  Echocardiogram 08/23/2020 IMPRESSIONS    1. Hypokineiss of the mid-apical anteroseptal, inferoseptal, inferior and  apical myocardium. . Left ventricular ejection fraction, by estimation, is  30 to 35%. The left ventricle has moderately decreased function. The left  ventricle demonstrates  regional wall motion abnormalities (see scoring diagram/findings for  description). There is mild concentric left ventricular hypertrophy. Left  ventricular diastolic parameters are indeterminate.  2. Right ventricular systolic function is normal. The right ventricular  size is normal. There is mildly elevated pulmonary artery systolic  pressure.  3. Right atrial size was severely dilated.  4. The mitral valve is normal in structure. No evidence of mitral valve  regurgitation. No evidence of mitral stenosis.  5. Tricuspid valve regurgitation is mild to moderate.  6. The aortic valve is tricuspid. Aortic valve regurgitation is not  visualized. No aortic stenosis is present.  7. The inferior vena cava is normal in size with greater than 50%  respiratory  variability, suggesting right atrial pressure of 3 mmHg.   Comparison(s): No significant change from prior study.   Lower extremity ABIs 11/25/2020  Examination Guidelines:  A complete evaluation includes at minimum, Doppler  waveform signals and systolic blood pressure reading at the level of  bilateral  brachial, anterior tibial, and posterior tibial arteries, when vessel  segments  are accessible. Bilateral testing is considered an integral part of a  complete  examination. Photoelectric Plethysmograph (PPG) waveforms and toe systolic  pressure readings are included as required and additional duplex testing  as  needed. Limited examinations for reoccurring indications may be performed  as  noted.     ABI Findings:  +--------+------------------+-----+--------+---------------+  Right  Rt Pressure (mmHg)IndexWaveformComment      +--------+------------------+-----+--------+---------------+  [id number]                         +--------+------------------+-----+--------+---------------+  PTA               biphasicbandaging/wound  +--------+------------------+-----+--------+---------------+  DP               biphasicbandaging/wound  +--------+------------------+-----+--------+---------------+   +---------+------------------+-----+---------+-------+  Left   Lt Pressure (mmHg)IndexWaveform Comment  +---------+------------------+-----+---------+-------+  Brachial 123                      +---------+------------------+-----+---------+-------+  PTA   135        1.10 triphasic      +---------+------------------+-----+---------+-------+  DP    132        1.07 triphasic      +---------+------------------+-----+---------+-------+  Great Toe42        0.34            +---------+------------------+-----+---------+-------+    +-------+------------+-----------+------------+------------+  ABI/TBIToday's ABI Today's TBIPrevious ABIPrevious TBI  +-------+------------+-----------+------------+------------+  Right not obtained0.45    1.11    0.80      +-------+------------+-----------+------------+------------+  Left  1.10    0.34    1.06    0.31      +-------+------------+-----------+------------+------------+       Assessment & Plan   1.  Coronary artery disease-denies chest pain today.Underwent cardiac catheterization 2011 and again in 2013 which showed patent stents with moderate residual coronary artery disease.  He was noted to have decreased EF 45- 40% via echo 2019. Continue aspirin, Plavix, metoprolol, nitroglycerin Heart healthy low-sodium diet-salty 6 given Increase physical activity as tolerated  Cardiomyopathy- no increased DOE or activity intolerance.  Echocardiogram 1/22 showed EF 30-35% details above. Continue Entresto, spironolactone, metoprolol, Jardiance, furosemide Heart healthy low-sodium diet-salty 6 given Increase physical activity as tolerated  Atrial fibrillation- heart rate today***.  Previously noted to have brief amount of atrial fibrillation.  Reports compliance with anticoagulation.  Denies bleeding issues. Continue metoprolol, apixaban Heart healthy low-sodium diet-salty 6 given Increase physical activity as tolerated  Critical lower limb ischemia- recently seen and evaluated with ABIs by Dr. Trula Slade Continue to follow with Dr. Trula Slade  Disposition: Follow-up with Dr. Percival Spanish in 3 months.  Jossie Ng. Nevaen Tredway NP-C    12/02/2020, 2:56 PM [address] Office [phone] Fax [phone]  Notice: This dictation was prepared with Dragon dictation along with smaller phrase technology. Any transcriptional errors that result from this process are unintentional and may not be  corrected upon review.  I spent***minutes examining this patient, reviewing medications, and using patient centered shared decision making involving her cardiac care.  Prior to her visit I spent greater than 20 minutes reviewing her past medical history,  medications, and prior cardiac tests.

## 2020-12-03 ENCOUNTER — Ambulatory Visit: Payer: Medicare Other | Admitting: General Practice

## 2020-12-10 DIAGNOSIS — I513 Intracardiac thrombosis, not elsewhere classified: Secondary | ICD-10-CM | POA: Diagnosis not present

## 2020-12-10 DIAGNOSIS — I5022 Chronic systolic (congestive) heart failure: Secondary | ICD-10-CM | POA: Diagnosis not present

## 2020-12-10 DIAGNOSIS — F419 Anxiety disorder, unspecified: Secondary | ICD-10-CM | POA: Diagnosis not present

## 2020-12-10 DIAGNOSIS — D508 Other iron deficiency anemias: Secondary | ICD-10-CM | POA: Diagnosis not present

## 2020-12-10 DIAGNOSIS — E119 Type 2 diabetes mellitus without complications: Secondary | ICD-10-CM | POA: Diagnosis not present

## 2020-12-10 DIAGNOSIS — F32A Depression, unspecified: Secondary | ICD-10-CM | POA: Diagnosis not present

## 2020-12-10 DIAGNOSIS — R5381 Other malaise: Secondary | ICD-10-CM | POA: Diagnosis not present

## 2020-12-11 DIAGNOSIS — Z4801 Encounter for change or removal of surgical wound dressing: Secondary | ICD-10-CM | POA: Diagnosis not present

## 2020-12-11 DIAGNOSIS — I5020 Unspecified systolic (congestive) heart failure: Secondary | ICD-10-CM | POA: Diagnosis not present

## 2020-12-11 DIAGNOSIS — I739 Peripheral vascular disease, unspecified: Secondary | ICD-10-CM | POA: Diagnosis not present

## 2020-12-11 DIAGNOSIS — M6281 Muscle weakness (generalized): Secondary | ICD-10-CM | POA: Diagnosis not present

## 2020-12-11 DIAGNOSIS — L97809 Non-pressure chronic ulcer of other part of unspecified lower leg with unspecified severity: Secondary | ICD-10-CM | POA: Diagnosis not present

## 2020-12-11 DIAGNOSIS — E0840 Diabetes mellitus due to underlying condition with diabetic neuropathy, unspecified: Secondary | ICD-10-CM | POA: Diagnosis not present

## 2020-12-18 NOTE — Progress Notes (Deleted)
Cardiology Clinic Note   Patient Name: Paul [name]. Date of Encounter: 12/18/2020  Primary Care Provider:  Cleophas Dunker, DO Primary Cardiologist:  Minus Breeding, MD  Patient Profile    Paul [name], Paul [name]. 50 year old male presents the clinic today for follow-up evaluation of his PAD and essential hypertension.  Past Medical History    Past Medical History:  Diagnosis Date  . Anxiety   . Bipolar disorder ([place])   . CAD (coronary artery disease)    Anterior MI 2007 with DES stenting of an occluded LAD, distal 70% stenosis, OM 60% stenosis, circumflex 90% stenosis treated with a drug-eluting stent, PA 90% stenosis treated medically.  Cath January 2013 70-80% diagonal stenosis in a small vessel, OM 50% stenosis, PDA long 75% stenosis. EF 45%. He was managed medically.  . Cardiomyopathy, ischemic    EF 45%  . COVID-26 February 2020   . Diabetes mellitus   . History of chest pain 11/25/2010  . Hyperlipidemia   . Hypertension   . Myocardial infarction ([place])   . PTSD (post-traumatic stress disorder)   . PVD (peripheral vascular disease) ([place])   . Tuberculosis    1989, took pills for it    Past Surgical History:  Procedure Laterality Date  . ABDOMINAL AORTAGRAM  07/09/2020   LOWER EXTREMITY  . ABDOMINAL AORTOGRAM W/LOWER EXTREMITY Right 07/09/2020   Procedure: ABDOMINAL AORTOGRAM W/LOWER EXTREMITY;  Surgeon: Serafina Mitchell, MD;  Location: [place];  Service: Cardiovascular;  Laterality: Right;  . ANGIOPLASTY ILLIAC ARTERY Left 07/10/2020   Procedure: BALLOON ANGIOPLASTY OF LEFT POPLITEAL USING RANGER 4.29m X 485mOVER-THE-WIRE PTA BALLOON;  Surgeon: BrSerafina MitchellMD;  [facility]: [facility];  Laterality: Left;  . APPLICATION OF WOUND VAC Left 02/17/2020   Procedure: APPLICATION OF WOUND VAC;  Surgeon: BrSerafina MitchellMD;  [facility];  Laterality: Left;  . Catherization  08/25/11   Dr. HoPercival Spanish. CORONARY  ANGIOPLASTY WITH STENT PLACEMENT  207, 2011   4 stents placed  . DIRECT LARYNGOSCOPY Left 08/14/2019   Procedure: DIRECT LARYNGOSCOPY;  Surgeon: NeRozetta NunneryMD;  [facility]: [facility];  Laterality: Left;  . FASCIOTOMY Left 02/17/2020   Procedure: Four Compartment FASCIOTOMY;  Surgeon: BrSerafina MitchellMD;  [facility];  Laterality: Left;  . FASCIOTOMY CLOSURE Left 02/17/2020   Procedure: FASCIOTOMY CLOSURE;  Surgeon: BrSerafina MitchellMD;  [facility];  Laterality: Left;  . FEMORAL-POPLITEAL BYPASS GRAFT Right 07/10/2020   Procedure: RIGHT ABOVE KNEE POPLITEAL TO POSTERIAL TIBIAL ARTERY BYPASS GRAFT;  Surgeon: BrSerafina MitchellMD;  [facility]: [facility];  Laterality: Right;  . LEFT HEART CATHETERIZATION WITH CORONARY ANGIOGRAM N/A 08/25/2011   Procedure: LEFT HEART CATHETERIZATION WITH CORONARY ANGIOGRAM;  Surgeon: JaMinus BreedingMD;  [facility];  Service: Cardiovascular;  Laterality: N/A;  . LOWER EXTREMITY ANGIOGRAM Bilateral 02/17/2020   Procedure: LOWER EXTREMITY ANGIOGRAM;  Surgeon: BrSerafina MitchellMD;  [facility];  Service: Vascular;  Laterality: Bilateral;  . LOWER EXTREMITY ANGIOGRAM Left 02/17/2020   Procedure: LOWER EXTREMITY ANGIOGRAM WITH Stenting Left Popliteal Artery, Angioplasty posterior Tibial Artery;  Surgeon: BrSerafina MitchellMD;  [facility];  Laterality: Left;  . LOWER EXTREMITY ANGIOGRAM Left 07/10/2020   Procedure: LEFT LOWER EXTREMITY ANGIOGRAM WITH INTERVENTION;  Surgeon: BrSerafina MitchellMD;  Location:  [facility];  Service: Vascular;  Laterality: Left;  Marland Kitchen MASS BIOPSY Left 08/14/2019   Procedure: BIOPSY EXCISION OF LEFT NECK MASS;  Surgeon: Rozetta Nunnery, MD;  Location: [facility];  Service: ENT;  Laterality: Left;  . THROMBECTOMY FEMORAL ARTERY Bilateral 02/17/2020   Procedure: Bilateral  Iliofemoral Thrombectomy and exposure of left lower leg.;  Surgeon:  Serafina Mitchell, MD;  Location: [facility] OR;  Service: Vascular;  Laterality: Bilateral;    Allergies  Allergies  Allergen Reactions  . Shrimp [Shellfish Allergy] Swelling    tongue    History of Present Illness    [name] has a PMH of coronary artery disease, PAD, uncontrolled type 2 diabetes, and paroxysmal atrial fibrillation.  He had a MI in 2007 was treated with LAD and circumflex DES.  He underwent cardiac catheterization 2011 and again in 2013 which showed patent stents with moderate residual coronary artery disease.  He was noted to have decreased EF 45- 40% via echo 2019.  He was admitted to the hospital 7/21 with leg weakness numbness, and cool extremities.  He was found to have critical limb ischemia.  CTA showed nearly occlusive thrombus within bilateral femoral arteries.  Dr. Trula Slade was consulted and performed urgent bilateral iliofemoral thrombectomy with left lower extremity fasciotomy.  However, revascularization and return of neurological function on the left was not achieved.  He returned to the OR for successful recannulization of his left popliteal artery, posterior tubular artery, and closure of his left lower extremity fasciotomy.  An echocardiogram during that time showed an EF of 25% with apical akinesis.  It was felt that he had a apical thrombus that caused his lower extremity embolism.  After surgery he was placed on aspirin Plavix, and apixaban.  He followed up with Dr. Trula Slade 02/29/2020.  His lower extremity Dopplers were negative for DVT.  His lower extremity arterial Doppler showed some decrease in his left lower extremity circulation ABI was 0.64.  During his follow-up with Dr. Percival Spanish he reported he was staying at [facility].  He was having dressing changes for a right leg wound.  He continued to drive but walks slowly due to his wound.  He denied chest pain, arm and neck discomfort.  He denied shortness of breath, PND, and orthopnea.  He was seen by Dr. Trula Slade  11/25/2020 and underwent evaluation of his lower extremities.  It was noted that his right leg bypass, and left leg stent were both patent.  Follow-up with repeat ABIs rescheduled for 3 months.  He presents the clinic today for follow-up evaluation states***  *** denies chest pain, shortness of breath, lower extremity edema, fatigue, palpitations, melena, hematuria, hemoptysis, diaphoresis, weakness, presyncope, syncope, orthopnea, and PND.  Home Medications    Prior to Admission medications   Medication Sig Start Date End Date Taking? Authorizing Provider  apixaban (ELIQUIS) 5 MG TABS tablet Take 1 tablet (5 mg total) by mouth 2 (two) times daily. 03/12/20   Erlene Quan, PA-C  ARIPiprazole (ABILIFY) 5 MG tablet Take 1 tablet (5 mg total) by mouth daily. 05/22/20   Meccariello, Bernita Raisin, DO  aspirin 81 MG EC tablet Take 1 tablet (81 mg total) by mouth daily. 06/14/20   Erlene Quan, PA-C  atorvastatin (LIPITOR) 40 MG tablet Take 1 tablet (40 mg total) by mouth at bedtime. 03/12/20   Erlene Quan, PA-C  Blood Glucose Monitoring Suppl (ONE TOUCH ULTRA 2) W/DEVICE KIT Check sugar once daily. ICD10 E11.65 01/24/15   Lamar Benes,  Tillie Rung, MD  citalopram (CELEXA) 20 MG tablet Take 1 tablet (20 mg total) by mouth 2 (two) times daily. 10/26/19   Nuala Alpha, DO  clopidogrel (PLAVIX) 75 MG tablet Take 1 tablet (75 mg total) by mouth daily. 03/12/20   Erlene Quan, PA-C  Continuous Glucose Monitor KIT 1 kit by Does not apply route daily. 02/20/19   Caroline More, DO  empagliflozin (JARDIANCE) 25 MG TABS tablet Take 25 mg by mouth daily. 12/01/19   Nuala Alpha, DO  ferrous sulfate 325 (65 FE) MG tablet Take 325 mg by mouth daily with breakfast.    [provider]  furosemide (LASIX) 40 MG tablet Take 1 tablet (40 mg total) by mouth daily. 05/03/20 05/03/21  Fransico Meadow, PA-C  glucose blood (ONE TOUCH ULTRA TEST) test strip Check sugar once daily in the morning. ICD10 E11.65 01/10/18   Mikell,  Jeani Sow, MD  insulin glargine (LANTUS) 100 UNIT/ML Solostar Pen Inject 20 Units into the skin every morning. Patient taking differently: Inject 5 Units into the skin daily. 05/06/20   Meccariello, Bernita Raisin, DO  metFORMIN (GLUCOPHAGE) 1000 MG tablet Take 1,000 mg by mouth 2 (two) times daily. 11/21/19   [provider]  metoprolol succinate (TOPROL-XL) 25 MG 24 hr tablet Take 1 tablet (25 mg total) by mouth daily. 07/19/20   Baglia, Corrina, PA-C  Multiple Vitamins-Minerals (MULTIVITAMIN WITH MINERALS) tablet Take 1 tablet by mouth daily.    [provider]  nitroGLYCERIN (NITROSTAT) 0.4 MG SL tablet Place 1 tablet (0.4 mg total) under the tongue every 5 (five) minutes as needed for chest pain. 10/07/19   Nuala Alpha, DO  ONE TOUCH LANCETS MISC Use to check sugar once daily. ICD10 E11.65 01/09/15   Willeen Niece, MD  sacubitril-valsartan (ENTRESTO) 24-26 MG Take 1 tablet by mouth 2 (two) times daily. 03/12/20   Erlene Quan, PA-C  spironolactone (ALDACTONE) 25 MG tablet Take 0.5 tablets (12.5 mg total) by mouth daily. 03/12/20   Erlene Quan, PA-C  traZODone (DESYREL) 50 MG tablet Take 50 mg by mouth at bedtime. Patient not taking: Reported on 11/25/2020    [provider]  triamcinolone cream (KENALOG) 0.1 % Apply 1 application topically 2 (two) times daily. 08/01/19   Sherene Sires, DO    Family History    Family History  Problem Relation Age of Onset  . Hypertension Mother   . Hypertension Father   . Diabetes Maternal Aunt   . Diabetes Maternal Uncle   . Cancer Maternal Grandmother    He indicated that his mother is deceased. He indicated that his father is deceased. He indicated that the status of his maternal grandmother is unknown. He indicated that the status of his maternal aunt is unknown. He indicated that the status of his maternal uncle is unknown.  Social History    Social History   Socioeconomic History  . Marital status: Single    Spouse  name: Not on file  . Number of children: Not on file  . Years of education: Not on file  . Highest education level: Not on file  Occupational History  . Not on file  Tobacco Use  . Smoking status: Never Smoker  . Smokeless tobacco: Never Used  Vaping Use  . Vaping Use: Never used  Substance and Sexual Activity  . Alcohol use: No  . Drug use: No  . Sexual activity: Not on file  Other Topics Concern  . Not on file  Social History Narrative  . Not on file   Social Determinants of Health   Financial Resource Strain: Medium Risk  . Difficulty of Paying Living Expenses: Somewhat hard  Food Insecurity: Food Insecurity Present  . Worried About Charity fundraiser in the Last Year: Often true  . Ran Out of Food in the Last Year: Often true  Transportation Needs: No Transportation Needs  . Lack of Transportation (Medical): No  . Lack of Transportation (Non-Medical): No  Physical Activity: Not on file  Stress: Not on file  Social Connections: Not on file  Intimate Partner Violence: Not on file     Review of Systems    General:  No chills, fever, night sweats or weight changes.  Cardiovascular:  No chest pain, dyspnea on exertion, edema, orthopnea, palpitations, paroxysmal nocturnal dyspnea. Dermatological: No rash, lesions/masses Respiratory: No cough, dyspnea Urologic: No hematuria, dysuria Abdominal:   No nausea, vomiting, diarrhea, bright red blood per rectum, melena, or hematemesis Neurologic:  No visual changes, wkns, changes in mental status. All other systems reviewed and are otherwise negative except as noted above.  Physical Exam    VS:  There were no vitals taken for this visit. , BMI There is no height or weight on file to calculate BMI. GEN: Well nourished, well developed, in no acute distress. HEENT: normal. Neck: Supple, no JVD, carotid bruits, or masses. Cardiac: RRR, no murmurs, rubs, or gallops. No clubbing, cyanosis, edema.  Radials/DP/PT 2+ and equal  bilaterally.  Respiratory:  Respirations regular and unlabored, clear to auscultation bilaterally. GI: Soft, nontender, nondistended, BS + x 4. MS: no deformity or atrophy. Skin: warm and dry, no rash. Neuro:  Strength and sensation are intact. Psych: Normal affect.  Accessory Clinical Findings    Recent Labs: 07/11/2020: ALT 17 07/16/2020: BUN 11; Creatinine, Ser 0.99; Potassium 4.6; Sodium 132 07/19/2020: Hemoglobin 7.9; Platelets 300   Recent Lipid Panel    Component Value Date/Time   CHOL 71 07/11/2020 0133   CHOL 141 01/10/2018 1419   TRIG 65 07/11/2020 0133   HDL 13 (L) 07/11/2020 0133   HDL 24 (L) 01/10/2018 1419   CHOLHDL 5.5 07/11/2020 0133   VLDL 13 07/11/2020 0133   LDLCALC 45 07/11/2020 0133   LDLCALC 72 01/10/2018 1419   LDLDIRECT 84 09/03/2011 1548    ECG personally reviewed by me today- *** - No acute changes  Echocardiogram 08/23/2020 IMPRESSIONS     1. Hypokineiss of the mid-apical anteroseptal, inferoseptal, inferior and  apical myocardium. . Left ventricular ejection fraction, by estimation, is  30 to 35%. The left ventricle has moderately decreased function. The left  ventricle demonstrates  regional wall motion abnormalities (see scoring diagram/findings for  description). There is mild concentric left ventricular hypertrophy. Left  ventricular diastolic parameters are indeterminate.   2. Right ventricular systolic function is normal. The right ventricular  size is normal. There is mildly elevated pulmonary artery systolic  pressure.   3. Right atrial size was severely dilated.   4. The mitral valve is normal in structure. No evidence of mitral valve  regurgitation. No evidence of mitral stenosis.   5. Tricuspid valve regurgitation is mild to moderate.   6. The aortic valve is tricuspid. Aortic valve regurgitation is not  visualized. No aortic stenosis is present.   7. The inferior vena cava is normal in size with greater than 50%  respiratory  variability, suggesting right atrial pressure of 3 mmHg.   Comparison(s): No significant change from prior study.  Lower extremity ABIs 11/25/2020  Examination Guidelines: A complete evaluation includes at minimum, Doppler  waveform signals and systolic blood pressure reading at the level of  bilateral  brachial, anterior tibial, and posterior tibial arteries, when vessel  segments  are accessible. Bilateral testing is considered an integral part of a  complete  examination. Photoelectric Plethysmograph (PPG) waveforms and toe systolic  pressure readings are included as required and additional duplex testing  as  needed. Limited examinations for reoccurring indications may be performed  as  noted.      ABI Findings:  +--------+------------------+-----+--------+---------------+  Right   Rt Pressure (mmHg)IndexWaveformComment          +--------+------------------+-----+--------+---------------+  [id number]                                             +--------+------------------+-----+--------+---------------+  PTA                            biphasicbandaging/wound  +--------+------------------+-----+--------+---------------+  DP                             biphasicbandaging/wound  +--------+------------------+-----+--------+---------------+   +---------+------------------+-----+---------+-------+  Left     Lt Pressure (mmHg)IndexWaveform Comment  +---------+------------------+-----+---------+-------+  Brachial 123                                      +---------+------------------+-----+---------+-------+  PTA      135               1.10 triphasic         +---------+------------------+-----+---------+-------+  DP       132               1.07 triphasic         +---------+------------------+-----+---------+-------+  Great Toe42                0.34                   +---------+------------------+-----+---------+-------+    +-------+------------+-----------+------------+------------+  ABI/TBIToday's ABI Today's TBIPrevious ABIPrevious TBI  +-------+------------+-----------+------------+------------+  Right  not obtained0.45       1.11        0.80          +-------+------------+-----------+------------+------------+  Left   1.10        0.34       1.06        0.31          +-------+------------+-----------+------------+------------+    Assessment & Plan   1.  Coronary artery disease-denies chest pain today.Underwent cardiac catheterization 2011 and again in 2013 which showed patent stents with moderate residual coronary artery disease.  He was noted to have decreased EF 45- 40% via echo 2019. Continue aspirin, Plavix, metoprolol, nitroglycerin Heart healthy low-sodium diet-salty 6 given Increase physical activity as tolerated  Cardiomyopathy- no increased DOE or activity intolerance.  Echocardiogram 1/22 showed EF 30-35% details above. Continue Entresto, spironolactone, metoprolol, Jardiance, furosemide Heart healthy low-sodium diet-salty 6 given Increase physical activity as tolerated  Atrial fibrillation- heart rate today***.  Previously noted to have brief amount of atrial fibrillation.  Reports compliance with anticoagulation.  Denies bleeding issues. Continue metoprolol, apixaban Heart healthy low-sodium diet-salty 6 given Increase  physical activity as tolerated  Critical lower limb ischemia- recently seen and evaluated with ABIs by Dr. Trula Slade Continue to follow with Dr. Trula Slade  Disposition: Follow-up with Dr. Percival Spanish in 3 months   Jossie Ng. Luccas Towell NP-C    12/18/2020, 12:56 PM [address] [place] [address] Office [phone] Fax [phone]  Notice: This dictation was prepared with Dragon dictation along with smaller phrase technology. Any transcriptional errors that result from this process are unintentional and may not be corrected  upon review.  I spent***minutes examining this patient, reviewing medications, and using patient centered shared decision making involving her cardiac care.  Prior to her visit I spent greater than 20 minutes reviewing her past medical history,  medications, and prior cardiac tests.

## 2020-12-19 DIAGNOSIS — U071 COVID-19: Secondary | ICD-10-CM | POA: Diagnosis not present

## 2020-12-19 DIAGNOSIS — F419 Anxiety disorder, unspecified: Secondary | ICD-10-CM | POA: Diagnosis not present

## 2020-12-19 DIAGNOSIS — I513 Intracardiac thrombosis, not elsewhere classified: Secondary | ICD-10-CM | POA: Diagnosis not present

## 2020-12-19 DIAGNOSIS — I5022 Chronic systolic (congestive) heart failure: Secondary | ICD-10-CM | POA: Diagnosis not present

## 2020-12-19 DIAGNOSIS — E119 Type 2 diabetes mellitus without complications: Secondary | ICD-10-CM | POA: Diagnosis not present

## 2020-12-19 DIAGNOSIS — F32A Depression, unspecified: Secondary | ICD-10-CM | POA: Diagnosis not present

## 2020-12-19 DIAGNOSIS — I739 Peripheral vascular disease, unspecified: Secondary | ICD-10-CM | POA: Diagnosis not present

## 2020-12-19 DIAGNOSIS — R5381 Other malaise: Secondary | ICD-10-CM | POA: Diagnosis not present

## 2020-12-20 ENCOUNTER — Ambulatory Visit: Payer: Medicare Other | Admitting: General Practice

## 2020-12-23 DIAGNOSIS — I10 Essential (primary) hypertension: Secondary | ICD-10-CM | POA: Diagnosis not present

## 2020-12-25 DIAGNOSIS — U071 COVID-19: Secondary | ICD-10-CM | POA: Diagnosis not present

## 2020-12-27 DIAGNOSIS — I5022 Chronic systolic (congestive) heart failure: Secondary | ICD-10-CM | POA: Diagnosis not present

## 2020-12-27 DIAGNOSIS — E119 Type 2 diabetes mellitus without complications: Secondary | ICD-10-CM | POA: Diagnosis not present

## 2020-12-27 DIAGNOSIS — D508 Other iron deficiency anemias: Secondary | ICD-10-CM | POA: Diagnosis not present

## 2021-01-01 DIAGNOSIS — Z4801 Encounter for change or removal of surgical wound dressing: Secondary | ICD-10-CM | POA: Diagnosis not present

## 2021-01-01 DIAGNOSIS — L97809 Non-pressure chronic ulcer of other part of unspecified lower leg with unspecified severity: Secondary | ICD-10-CM | POA: Diagnosis not present

## 2021-01-01 DIAGNOSIS — M6281 Muscle weakness (generalized): Secondary | ICD-10-CM | POA: Diagnosis not present

## 2021-01-01 DIAGNOSIS — I739 Peripheral vascular disease, unspecified: Secondary | ICD-10-CM | POA: Diagnosis not present

## 2021-01-02 DIAGNOSIS — Z20828 Contact with and (suspected) exposure to other viral communicable diseases: Secondary | ICD-10-CM | POA: Diagnosis not present

## 2021-01-08 DIAGNOSIS — R404 Transient alteration of awareness: Secondary | ICD-10-CM | POA: Diagnosis not present

## 2021-01-08 DIAGNOSIS — I499 Cardiac arrhythmia, unspecified: Secondary | ICD-10-CM | POA: Diagnosis not present

## 2021-01-08 DIAGNOSIS — R0689 Other abnormalities of breathing: Secondary | ICD-10-CM | POA: Diagnosis not present

## 2021-01-08 DIAGNOSIS — R58 Hemorrhage, not elsewhere classified: Secondary | ICD-10-CM | POA: Diagnosis not present

## 2021-01-24 ENCOUNTER — Ambulatory Visit: Payer: Medicare Other | Admitting: General Practice

## 2021-02-07 DIAGNOSIS — 419620001 Death: Secondary | SNOMED CT | POA: Diagnosis not present

## 2021-02-07 DEATH — deceased

## 2021-02-24 ENCOUNTER — Encounter (HOSPITAL_COMMUNITY): Payer: Medicare Other

## 2021-02-24 ENCOUNTER — Ambulatory Visit: Payer: Medicare Other

## 2021-04-25 ENCOUNTER — Other Ambulatory Visit (HOSPITAL_COMMUNITY): Payer: Self-pay
